# Patient Record
Sex: Female | Born: 1960 | Race: White | Hispanic: No | Marital: Married | Smoking: Never smoker
Health system: Southern US, Community
[De-identification: ages and names within clinical notes are randomized; demographics above are authoritative.]

## PROBLEM LIST (undated history)

## (undated) DIAGNOSIS — K219 Gastro-esophageal reflux disease without esophagitis: Secondary | ICD-10-CM

## (undated) DIAGNOSIS — I10 Essential (primary) hypertension: Secondary | ICD-10-CM

## (undated) DIAGNOSIS — Z9889 Other specified postprocedural states: Secondary | ICD-10-CM

## (undated) DIAGNOSIS — M1990 Unspecified osteoarthritis, unspecified site: Secondary | ICD-10-CM

## (undated) DIAGNOSIS — K631 Perforation of intestine (nontraumatic): Secondary | ICD-10-CM

## (undated) DIAGNOSIS — F419 Anxiety disorder, unspecified: Secondary | ICD-10-CM

## (undated) HISTORY — DX: Gastro-esophageal reflux disease without esophagitis: K21.9

## (undated) HISTORY — PX: FOOT SURGERY: SHX648

## (undated) HISTORY — PX: HERNIA REPAIR: SHX51

## (undated) HISTORY — PX: APPENDECTOMY: SHX54

## (undated) HISTORY — PX: HAND SURGERY: SHX662

## (undated) HISTORY — DX: Other specified postprocedural states: Z98.890

## (undated) HISTORY — PX: CHOLECYSTECTOMY: SHX55

---

## 2000-07-06 ENCOUNTER — Encounter: Admission: RE | Admit: 2000-07-06 | Discharge: 2000-07-06 | Payer: Self-pay | Admitting: Family Medicine

## 2000-08-30 ENCOUNTER — Encounter
Admission: RE | Admit: 2000-08-30 | Discharge: 2000-11-28 | Payer: Self-pay | Admitting: Physical Medicine and Rehabilitation

## 2000-10-01 ENCOUNTER — Encounter: Payer: Self-pay | Admitting: Physical Medicine and Rehabilitation

## 2000-10-01 ENCOUNTER — Encounter
Admission: RE | Admit: 2000-10-01 | Discharge: 2000-10-01 | Payer: Self-pay | Admitting: Physical Medicine and Rehabilitation

## 2001-08-01 ENCOUNTER — Ambulatory Visit (HOSPITAL_COMMUNITY): Admission: RE | Admit: 2001-08-01 | Discharge: 2001-08-01 | Payer: Self-pay | Admitting: Internal Medicine

## 2004-09-08 ENCOUNTER — Encounter: Admission: RE | Admit: 2004-09-08 | Discharge: 2004-09-22 | Payer: Self-pay | Admitting: Orthopedic Surgery

## 2006-06-10 ENCOUNTER — Ambulatory Visit: Payer: Self-pay | Admitting: Internal Medicine

## 2006-06-22 ENCOUNTER — Ambulatory Visit: Payer: Self-pay | Admitting: Internal Medicine

## 2006-06-22 ENCOUNTER — Ambulatory Visit (HOSPITAL_COMMUNITY): Admission: RE | Admit: 2006-06-22 | Discharge: 2006-06-22 | Payer: Self-pay | Admitting: Internal Medicine

## 2008-01-13 ENCOUNTER — Ambulatory Visit (HOSPITAL_COMMUNITY): Admission: RE | Admit: 2008-01-13 | Discharge: 2008-01-13 | Payer: Self-pay | Admitting: Family Medicine

## 2010-04-16 ENCOUNTER — Other Ambulatory Visit: Payer: Self-pay | Admitting: Neurology

## 2010-04-16 DIAGNOSIS — M542 Cervicalgia: Secondary | ICD-10-CM

## 2010-04-22 ENCOUNTER — Ambulatory Visit (HOSPITAL_COMMUNITY)
Admission: RE | Admit: 2010-04-22 | Discharge: 2010-04-22 | Disposition: A | Discharge status: Home / Self Care | Payer: BC Managed Care – PPO | Source: Ambulatory Visit | Attending: Neurology | Admitting: Neurology

## 2010-04-22 DIAGNOSIS — M542 Cervicalgia: Secondary | ICD-10-CM

## 2010-04-22 DIAGNOSIS — M5030 Other cervical disc degeneration, unspecified cervical region: Secondary | ICD-10-CM | POA: Insufficient documentation

## 2010-04-22 DIAGNOSIS — M79609 Pain in unspecified limb: Secondary | ICD-10-CM | POA: Insufficient documentation

## 2010-04-22 DIAGNOSIS — M47812 Spondylosis without myelopathy or radiculopathy, cervical region: Secondary | ICD-10-CM | POA: Insufficient documentation

## 2010-07-11 NOTE — Op Note (Signed)
[name], [name]               ACCOUNT NO.:  [id number]   MEDICAL RECORD NO.:  [id number]          PATIENT TYPE:  AMB   LOCATION:  DAY                           FACILITY:  [facility]   PHYSICIAN:  R. Roetta Sessions, M.D. DATE OF BIRTH:  10-12-1960   DATE OF PROCEDURE:  06/22/2006  DATE OF DISCHARGE:                               OPERATIVE REPORT   PROCEDURE:  Diagnostic esophagogastroduodenoscopy with [facility] dilation.   INDICATIONS FOR PROCEDURE:  A 50 year old lady with worsening reflux  symptoms in the setting of significant weight gain.  She also has  recurrent esophageal dysphagia.  Prior EGD demonstrated patulous EG  junction.  However, in 2003 when this was done, a 56-French Maloney  dilator was passed associated with resolution of her symptoms.  EGD is  now being done to further evaluate her symptoms.  It is notable that I  switched her out from Nexium to AcipHex 20 mg orally b.i.d. when I saw  her in the office on June 10, 2006.  This has been associated with  significant improvement in her reflux symptoms.  Please see  documentation in the medical record for more information.   PROCEDURE NOTE:  O2 saturation, blood pressure, pulse and respirations  were monitored throughout the entire procedure.  Conscious sedation:  Versed 5 mg IV, Demerol 125 mg IV in divided doses.  Cetacaine spray for  topical pharyngeal anesthesia.  Instrument:  Pentax video chip system.   FINDINGS:  Examination of the tubular esophagus revealed a very  noncritical Schatzki ring and a patulous EG junction.  The esophageal  mucosa otherwise appeared normal.  EG junction easily traversed.   Stomach:  Gastric cavity was empty and insufflated well with air.  A  thorough examination of the gastric mucosa including a retroflexed view  of the proximal stomach and esophagogastric junction demonstrated only a  small hiatal hernia.  Pylorus was patent and easily traversed.  Examination of the bulb and second  portion revealed no abnormalities.   therapeutic/diagnostic maneuvers performed:  The scope was withdrawn.  A  56-French Maloney dilator was passed to full insertion.  A look back  revealed no apparent complication related to passage of the dilator.  The patient tolerated the procedure well and was reacted in endoscopy.   IMPRESSION:  1. Noncritical Schatzki ring otherwise, normal esophagus.  2. Patulous esophagogastric junction, status post passage of 56-French      Maloney dilator.  3. Small hiatal hernia.  4. Otherwise normal stomach, D1, D2.   RECOMMENDATIONS:  1. Continue AcipHex 20 mg orally b.i.d. for the next month.  2. Antireflux/lifestyle/diet literature provided.  I have encouraged      continued weight loss and dietary modification.  We will see her      back in the office in 1 month.      [name], M.D.  Electronically Signed     RMR/MEDQ  D:  06/22/2006  T:  06/22/2006  Job:  [id number]   cc:   Marrian Salvage, M.D.  OB/GYN, [place], [place]

## 2010-07-11 NOTE — Op Note (Signed)
[facility]  Patient:    [name], STITZER Visit Number: [id number] MRN: [id number]          Service Type: END Location: DAY Attending Physician:  Jonathon Bellows Dictated by:   Roetta Sessions, M.D. Proc. Date: 08/01/01 Admit Date:  08/01/2001   CC:         Dr. Reola Calkins,  [facility]   Operative Report  PROCEDURE:  Esophagogastroduodenoscopy with [name] dilation followed by biopsy.  INDICATIONS FOR PROCEDURE:  The patient is a 50 year old lady with longstanding gastroesophageal reflux symptoms and breakthrough symptomatology on Protonix 40 mg orally daily. She has intermittent esophageal dysphagia. EGD is done to further evaluate her symptoms. This approach has been discussed with Ms. Colgan at length. The potential risks, benefits, and alternatives have been reviewed, questions answered. She is agreeable. Please see my consultation note from Jul 13, 2001 for more information.  MONITORING:  O2 saturations, blood pressure, pulse and respirations monitored throughout the entire procedure.  CONSCIOUS SEDATION:  Versed 3 mg IV, Demerol 75 mg IV in divided doses.  INSTRUMENT:  Olympus video GIF gastroscope.  FINDINGS:  Examination of the tubular esophagus revealed a patulous EG junction. There was no evidence of ring, stricture, Barretts esophagus or esophagitis. The EG junction was easily traversed.  STOMACH:  The gastric cavity was empty and insufflated well with air. A thorough examination of the gastric mucosa including a retroflexed view of the proximal stomach and esophagogastric junction demonstrated a small hiatal hernia and multiple antral erosions. Some of these erosions were as wide as 4 mm and as long as 2-3 cm. Please see photos. There did not appear to be an infiltrating process. There is no frank ulcer. The pylorus was patent and easily traversed.  DUODENUM:  The bulb and second portion appeared  normal.  THERAPEUTIC/DIAGNOSTIC MANEUVERS PERFORMED:  A 56 French Maloney dilator is passed to full insertion without resistance and without blood on the dilator. Look back revealed no apparent complications related to the passage of the dilator. Subsequently, biopsies for histology from the antrum were taken. The patient tolerated the procedure well and was reacted in endoscopy.  IMPRESSION:  1. Normal esophagus, patulous EG junction status post Maloney dilation.  2. Small hiatal hernia.  3. Multiple antral erosions, otherwise, gastric mucosa appeared normal.     Normal D1 and D2. Biopsies for histology of the antrum taken.  Through Dr. Lonell Grandchild office, CBC was normal, H. pylori serologies were negative, CHEM 20 was negative, amylase was normal.  RECOMMENDATIONS:  1. Increase Protonix to 40 mg orally b.i.d.  2. Antireflux measures emphasized.  3. Follow-up on path.  4. Office visit with [place] in six weeks. Dictated by:   Roetta Sessions, M.D. Attending Physician:  Jonathon Bellows DD:  08/01/01 TD:  08/02/01 Job: [id number]

## 2010-07-11 NOTE — H&P (Signed)
[name], [name]               ACCOUNT NO.:  [id number]   MEDICAL RECORD NO.:  [id number]          PATIENT TYPE:  AMB   LOCATION:                                FACILITY:  [facility]   PHYSICIAN:  R. Roetta Sessions, M.D. DATE OF BIRTH:  July 26, 1960   DATE OF ADMISSION:  DATE OF DISCHARGE:  LH                              HISTORY & PHYSICAL   CHIEF COMPLAINT:  Difficulty swallowing and worsening reflux symptoms.   HISTORY OF PRESENT ILLNESS:  Ms. [name] is [name] residing in [place], [place], who came to see me for further  evaluation of reflux symptoms and intermittent dysphagia to solid food.  I saw this nice lady for similar symptoms in June 2003.  She underwent  an EGD, which revealed a normal esophagus, patulous EG junction, a small  hiatal hernia and some antral erosions.  Biopsies revealed a non-H.  pylori-related gastritis.  A 56-French Maloney dilator was passed, which  was associated with resolution of her dysphagia.  She had been on  Protonix and subsequently switched to Nexium along the way and things  did well for awhile, until the past 6 months.  She has gained a good 30  pounds since she was last seen.  She has been Dr. Marrian Spencer, OB/GYN,  up in [place] and she is on a diet; she has lost a few pounds  recently.  She has not had any melena or rectal bleeding.  No true  odynophagia.  She also notes a lump along her upper breast bone.  She  rarely consumes alcohol.  There is no tobacco history.  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   PAST MEDICAL HISTORY:  1. Depression.  2. GERD.   PAST SURGERIES:  1. Cholecystectomy.  2. C-section.  3. Foot surgery.   CURRENT MEDICATIONS:  1. Adipex diet prescription.  2. Nexium 40 mg orally b.i.d.   ALLERGIES:  Possibly CODEINE.   FAMILY HISTORY:  Mother has fibromyalgia.  Father had esophageal and  throat cancer, otherwise negative for chronic GI or liver illness.   SOCIAL HISTORY:  The patient is married with 2 children, works for  [employer], no tobacco, occasional alcoholic beverage.   REVIEW OF SYSTEMS:  No chest pain on exertion.  Weight gain as outlined  above.  No fever or chills.   PHYSICAL EXAMINATION:  GENERAL:  A pleasant 45-year lady resting  comfortably.  VITAL SIGNS:  Weight 195, height 5 feet 1 inch.  Temperature 98, BP  110/82, pulse 84.  SKIN:  Warm and dry.  CHEST:  Lungs are clear to auscultation.  She perceives a knot over  her upper sternal body.  On examination of this area, I do not palpate  any abnormality or tenderness.  BREASTS:  Exam is deferred.  ABDOMEN:  Obese, positive bowel sounds, soft, nontender without  appreciable mass or organomegaly.  EXTREMITIES:  No  edema.   IMPRESSION:  Ms. [name] is a pleasant 50 year old lady with  worsening reflux symptoms and intermittent esophageal dysphagia in the  setting of significant weight gain.  She is significantly over her ideal  body weight.  She has become refractory to Nexium therapy.  She needs to  have another esophagogastroduodenoscopy with possible esophageal  dilation; this approach has been discussed with Ms. [name] and  potential risks, benefits and alternatives have been reviewed, questions  answered and she is agreeable.  I plan to perform  esophagogastroduodenoscopy with possible esophageal dilation in the near  future at [facility].  In the interim, we will go ahead and  just switch her out from Nexium to Aciphex 20 mg orally b.i.d.  We will  give her enough samples for 2 weeks to see how this works for this nice  lady and we will go from there.      Jonathon Bellows, M.D.  Electronically Signed     RMR/MEDQ  D:  06/10/2006  T:  06/11/2006  Job:  [id number]

## 2010-07-11 NOTE — Consult Note (Signed)
[facility]  Patient:    [name], [name] Visit Number: [id number] MRN: [id number]          Service Type: END Location: DAY Attending Physician:  Jonathon Bellows Dictated by:   Roetta Sessions, M.D. Proc. Date: 07/13/01 Admit Date:  08/01/2001 Discharge Date: 08/01/2001   CC:         Dr. Reola Calkins, [facility], [facility]   Consultation Report  DATE OF BIRTH:  Jul 20, 1960  REASON FOR CONSULTATION:  Longstanding GERD.  HISTORY OF PRESENT ILLNESS:  Ms. [name] is a pleasant 50 year old lady referred through the courtesy of Dr. Reola Calkins over in [place] to further evaluate a several-year history of typical reflux symptoms.  The patient has reflux for at least 10 to 20 years.  She has taken a variety of over-the-counter agents including H2 blockers and Rolaids.  She more recently has been on Prevacid, Nexium, and currently Protonix with varying degrees of effect.  She continues to have some breakthrough symptoms but has been helped considerably with PPI therapy.  She had never had an upper GI series or direct visualization of her upper GI tract.  She rarely has esophageal dysphagia, but this is unusual. She does not have any early satiety, no nausea or vomiting, no melena, no rectal bleeding.  She moves her bowels at least once daily.  She does not smoke.  She rarely consumes alcohol.  PAST MEDICAL HISTORY:  Significant for depression.  PAST SURGICAL HISTORY:  Cholecystectomy, C section x 1.  CURRENT MEDICATIONS: 1. Protonix 40 mg orally daily. 2. Prozac 20 mg daily. 3. Multivitamins daily.  ALLERGIES:  CODEINE causes hot flashes.  FAMILY HISTORY:  Father has history of cancer of the esophagus.  He had a resection.  He had longstanding reflux and never smoked or consumed alcohol. Otherwise negative for prior GI or liver disease.  SOCIAL HISTORY:  The patient has been married for 4 years, has two children, three step  children.  She is employed for [employer].  She does not use tobacco, rarely consumes alcohol.  REVIEW OF SYSTEMS:   No chest pain, dyspnea on exertion.  No fever, chills, no weight loss.  PHYSICAL EXAMINATION:  GENERAL:  Pleasant 50 year old resting comfortably.  VITAL SIGNS:  Weight 170.5.  Height 5 feet 11.  Temperature 98.  Blood pressure 102/62.  Pulse 58.  SKIN:  No jaundice.  HEENT:  No scleral icterus.  NECK:  JVD not prominent.  CHEST:  Lungs are clear to auscultation.  CARDIAC:  Regular rate and rhythm without murmur, gallop, or rub.  ABDOMEN:  Obese, nondistended.  Positive bowel sounds.  Some epigastric tenderness to palpation.  No appreciable mass or organomegaly.  EXTREMITIES:  No edema.  IMPRESSION:  Ms. [name] is a 50 year old lady with longstanding typical reflux symptoms.  Symptoms have been significantly blunted with proton pump inhibitor therapy.  She continues to have breakthrough of rather aggressive antireflux regimen.  She really needs to have her upper gastrointestinal tract evaluated to rule out complicating gastroesophageal reflux disease.  I have discussed this approach with [name].  Potential risks, benefits, and alternatives have been reviewed and questions answered.  She is agreeable. Will plan to perform EGD in the near future at [facility].  Further recommendations will follow.  I would like to thank Dr. Reola Calkins for allowing me to see this nice lady today. Dictated by:   Roetta Sessions, M.D. Attending Physician:  Jonathon Bellows DD:  07/13/01 TD:  07/13/01 Job: [id number]

## 2010-10-09 ENCOUNTER — Ambulatory Visit (INDEPENDENT_AMBULATORY_CARE_PROVIDER_SITE_OTHER): Payer: BC Managed Care – PPO | Admitting: Gastroenterology

## 2010-10-09 ENCOUNTER — Encounter: Payer: Self-pay | Admitting: Gastroenterology

## 2010-10-09 VITALS — BP 147/85 | HR 74 | Temp 97.5°F | Ht 61.0 in | Wt 193.8 lb

## 2010-10-09 DIAGNOSIS — K219 Gastro-esophageal reflux disease without esophagitis: Secondary | ICD-10-CM

## 2010-10-09 DIAGNOSIS — R1310 Dysphagia, unspecified: Secondary | ICD-10-CM

## 2010-10-09 DIAGNOSIS — R1013 Epigastric pain: Secondary | ICD-10-CM

## 2010-10-09 DIAGNOSIS — Z1211 Encounter for screening for malignant neoplasm of colon: Secondary | ICD-10-CM

## 2010-10-09 LAB — HEPATIC FUNCTION PANEL
AST: 24 U/L (ref 0–37)
Albumin: 4.2 g/dL (ref 3.5–5.2)
Bilirubin, Direct: 0.1 mg/dL (ref 0.0–0.3)
Total Bilirubin: 0.4 mg/dL (ref 0.3–1.2)

## 2010-10-09 LAB — LIPASE: Lipase: 13 U/L (ref 0–75)

## 2010-10-09 MED ORDER — ESOMEPRAZOLE MAGNESIUM 40 MG PO CPDR: 40.0000 mg | DELAYED_RELEASE_CAPSULE | Freq: Two times a day (BID) | ORAL | Status: DC

## 2010-10-09 NOTE — Assessment & Plan Note (Signed)
No prior colonoscopy, routine screening due. No lower GI symptoms currently. Proceed with colonoscopy at time of EGD.  Proceed with TCS with Dr. Jena Gauss in near future: the risks, benefits, and alternatives have been discussed with the patient in detail. The patient states understanding and desires to proceed.

## 2010-10-09 NOTE — Progress Notes (Signed)
Cc to PCP 

## 2010-10-09 NOTE — Patient Instructions (Addendum)
We have set you up for a look at your upper and lower GI tract.  Stop Prilosec. Start Nexium 40 mg before breakfast and before dinner.   Stop taking Aleve or anything of that nature (Ibuprofen, Advil, Naproxen, Naprosyn).  Try Tylenol Arthritis for knee pain.   Please have labs drawn, and we will call you with the results.

## 2010-10-09 NOTE — Assessment & Plan Note (Signed)
50 year old Caucasian female with 3-4 month hx of worsening reflux and dysphagia. Known Schatzki's ring in past, non-critical. Last dilatation in 2008, hx of antral erosions in 2003. Likely exacerbated reflux due to missing doses of PPI (ran out of Nexium, switched to Prilosec), dysphagia likely secondary to known Schatzki's ring or uncontrolled reflux. Complains of epigastric pain as well, 1-2 times per day, sometimes worsened with eating, in the setting of twice daily Aleve. Concern for NSAID-induced gastritis, PUD. On physical exam, TTP in RUQ as well. Doubt dealing with other etiology, but we will add lipase and HFP for completeness.   Switch from Prilosec to Nexium, increased to BID (pt was on BID in the past, Nexium worked best) STOP Aleve and anything of that nature. Only tylenol products HFP, Lipase Proceed with upper endoscopy/possible dilation in the near future with Dr. Jena Gauss. The risks, benefits, and alternatives have been discussed in detail with patient. They have stated understanding and desire to proceed.

## 2010-10-09 NOTE — Progress Notes (Signed)
Referring Provider: No ref. provider found Primary Care Physician:  Kirk Ruths, MD Primary Gastroenterologist:  Dr. Jena Gauss   Chief Complaint  Patient presents with  . Gastrophageal Reflux    HPI:   [name] is a pleasant 50 year old Caucasian female who presents today at the request of Dr. Josephina Shih secondary to dysphagia and reflux. She has a hx of non-critical Schatzki's ring, s/p dilation in 2008. In 2003, EGD showed chronic gastritis, no H.pylori. Dr. Andrey Campanile actually performed a fiberoptic laryngoscopy in early August. This showed only abnormal thickened mucosa in arytenoid region.  Reports a 3-4 months worsening dysphagia, severe reflux. Difficulty with cold and tough textures. Describes reflux as constant. She has been on Aciphex and Nexium in the past; Nexium ran out a few months ago. She then switched to Prilosec, with little relief. She feels Nexium was the best agent for her in the past.   She reports epigastric pain as well, intermittent, 1-2 times per day. Occasionally exacerbated by eating. Described as "stabbing", radiating up towards retrosternal area, lasting a minute or two. Holds breath to find relief. Takes Aleve twice per day. No aspirin powders. Denies melena or brbpr. No N/V. She did not know that Aleve was an NSAID.  She has had no prior colonoscopy.   Past Medical History  Diagnosis Date  . S/P endoscopy     2008: noncritical Schatzki ring s/p dilation, normal esophagus and stomach, 2003: normal esophagus, s/p Maloney dilation, multiple antral erosions consistent with chronic gastritis, no H.pylori,   . GERD (gastroesophageal reflux disease)     Past Surgical History  Procedure Date  . Cholecystectomy   . Hand surgery     left  . Foot surgery     right  . Cesarean section     Current Outpatient Prescriptions  Medication Sig Dispense Refill  . omeprazole (PRILOSEC OTC) 20 MG tablet Take 20 mg by mouth daily.        . Pyridoxine HCl (VITAMIN  B-6) 500 MG tablet Take 500 mg by mouth 2 (two) times daily.        . vitamin B-12 (CYANOCOBALAMIN) 1000 MCG tablet Take 1,000 mcg by mouth daily.        . vitamin E 1000 UNIT capsule Take 1,000 Units by mouth daily.        Marland Kitchen esomeprazole (NEXIUM) 40 MG capsule Take 1 capsule (40 mg total) by mouth 2 (two) times daily. Take 30 minutes before breakfast and 30 minutes before dinner.  62 capsule  1    Allergies as of 10/09/2010  . (No Known Allergies)    Family History  Problem Relation Age of Onset  . Cancer Father     esophagus ca, living  . Colon cancer Paternal Uncle     in 69s, early 44s    History   Social History  . Marital Status: Married    Spouse Name: N/A    Number of Children: N/A  . Years of Education: N/A   Occupational History  . [employer] stage manager    Social History Main Topics  . Smoking status: Never Smoker   . Smokeless tobacco: Not on file  . Alcohol Use: Yes     one night a week  . Drug Use: No  . Sexually Active: Not on file   Other Topics Concern  . Not on file   Social History Narrative  . No narrative on file    Review of Systems: Gen: Denies any fever, chills, loss  of appetite, fatigue, weight loss. CV: Denies chest pain, heart palpitations, syncope, peripheral edema. Resp: Denies shortness of breath with rest, cough, wheezing GI: +dysphagia. Denies hematemesis, fecal incontinence, or jaundice.  GU : Denies urinary burning, urinary frequency, urinary incontinence.  MS: Denies joint pain, muscle weakness, cramps, limited movement Derm: Denies rash, itching, dry skin Psych: Denies depression, anxiety, confusion or memory loss  Heme: Denies bruising, bleeding, and enlarged lymph nodes.  Physical Exam: BP 147/85  Pulse 74  Temp(Src) 97.5 F (36.4 C) (Temporal)  Ht 5\' 1"  (1.549 m)  Wt 193 lb 12.8 oz (87.907 kg)  BMI 36.62 kg/m2 General:   Alert and oriented. Well-developed, well-nourished, pleasant and cooperative. Head:  Normocephalic  and atraumatic. Eyes:  Conjunctiva pink, sclera clear, no icterus.   Conjunctiva pink. Ears:  Normal auditory acuity. Nose:  No deformity, discharge,  or lesions. Mouth:  No deformity or lesions, mucosa pink and moist.  Neck:  Supple, without mass or thyromegaly. Lungs:  Clear to auscultation bilaterally, without wheezing, rales, or rhonchi.  Heart:  S1, S2 present without murmurs noted.  Abdomen:  +BS, soft, TTP epigastric and RUQ. NO masses noted.  non-distended. Without  HSM. No rebound or guarding. No hernias noted. Rectal:  Deferred  Msk:  Symmetrical without gross deformities. Normal posture. Extremities:  Without clubbing or edema. Neurologic:  Alert and  oriented x4;  grossly normal neurologically. Skin:  Intact, warm and dry without significant lesions or rashes Cervical Nodes:  No significant cervical adenopathy. Psych:  Alert and cooperative. Normal mood and affect.

## 2010-10-16 MED ORDER — SODIUM CHLORIDE 0.45 % IV SOLN
Freq: Once | INTRAVENOUS | Status: AC
  Administered 2010-10-17: 1000 mL via INTRAVENOUS

## 2010-10-17 ENCOUNTER — Encounter (HOSPITAL_COMMUNITY): Payer: Self-pay | Admitting: *Deleted

## 2010-10-17 ENCOUNTER — Encounter (HOSPITAL_COMMUNITY)
Admission: RE | Disposition: A | Discharge status: Home / Self Care | Payer: Self-pay | Source: Ambulatory Visit | Attending: Internal Medicine

## 2010-10-17 ENCOUNTER — Ambulatory Visit (HOSPITAL_COMMUNITY)
Admission: RE | Admit: 2010-10-17 | Discharge: 2010-10-17 | Disposition: A | Discharge status: Home / Self Care | Payer: BC Managed Care – PPO | Source: Ambulatory Visit | Attending: Internal Medicine | Admitting: Internal Medicine

## 2010-10-17 DIAGNOSIS — Z1211 Encounter for screening for malignant neoplasm of colon: Secondary | ICD-10-CM | POA: Insufficient documentation

## 2010-10-17 DIAGNOSIS — K222 Esophageal obstruction: Secondary | ICD-10-CM | POA: Insufficient documentation

## 2010-10-17 DIAGNOSIS — R635 Abnormal weight gain: Secondary | ICD-10-CM | POA: Insufficient documentation

## 2010-10-17 DIAGNOSIS — K219 Gastro-esophageal reflux disease without esophagitis: Secondary | ICD-10-CM

## 2010-10-17 DIAGNOSIS — R1310 Dysphagia, unspecified: Secondary | ICD-10-CM | POA: Insufficient documentation

## 2010-10-17 DIAGNOSIS — R109 Unspecified abdominal pain: Secondary | ICD-10-CM | POA: Insufficient documentation

## 2010-10-17 HISTORY — PX: MALONEY DILATION: SHX5535

## 2010-10-17 HISTORY — PX: ESOPHAGOGASTRODUODENOSCOPY: SHX5428

## 2010-10-17 HISTORY — PX: COLONOSCOPY: SHX5424

## 2010-10-17 SURGERY — COLONOSCOPY
Anesthesia: Moderate Sedation

## 2010-10-17 MED ORDER — BUTAMBEN-TETRACAINE-BENZOCAINE 2-2-14 % EX AERO
INHALATION_SPRAY | CUTANEOUS | Status: DC | PRN
  Administered 2010-10-17: 2 via TOPICAL

## 2010-10-17 MED ORDER — MIDAZOLAM HCL 5 MG/5ML IJ SOLN
INTRAMUSCULAR | Status: DC | PRN
  Administered 2010-10-17 (×2): 2 mg via INTRAVENOUS
  Administered 2010-10-17: 1 mg via INTRAVENOUS

## 2010-10-17 MED ORDER — MIDAZOLAM HCL 5 MG/5ML IJ SOLN
INTRAMUSCULAR | Status: AC
  Filled 2010-10-17: qty 10

## 2010-10-17 MED ORDER — MEPERIDINE HCL 100 MG/ML IJ SOLN
INTRAMUSCULAR | Status: DC | PRN
  Administered 2010-10-17 (×2): 50 mg via INTRAVENOUS

## 2010-10-17 MED ORDER — MEPERIDINE HCL 100 MG/ML IJ SOLN
INTRAMUSCULAR | Status: AC
  Filled 2010-10-17: qty 2

## 2010-10-17 NOTE — H&P (Signed)
GERD (gastroesophageal reflux disease)   - Primary    530.81    Epigastric pain     789.06    Dysphagia     787.20    Encounter for screening colonoscopy     V76.51      Reason for Visit     Gastrophageal Reflux        Current Vitals       Recorded User        10/09/2010  9:29 AM  Ginger L Walker, NT           BP Pulse Temp (Src) Resp Ht Wt    147/85  74  97.5 F (36.4 C) (Temporal)  N/A  5\' 1"  (1.549 m)  193 lb 12.8 oz (87.907 kg)       BMI SpO2 PF LMP    36.62 kg/m2  N/A  N/A  N/A          Progress Notes     Gerrit Halls, NP  10/09/2010 11:24 AM  Signed   Referring Provider: No ref. provider found Primary Care Physician:  Kirk Ruths, MD Primary Gastroenterologist:  Dr. Jena Gauss      Chief Complaint   Patient presents with   .  Gastrophageal Reflux      HPI:    Ms. Gravelle is a pleasant 50 year old Caucasian female who presents today at the request of Dr. Josephina Shih secondary to dysphagia and reflux. She has a hx of non-critical Schatzki's ring, s/p dilation in 2008. In 2003, EGD showed chronic gastritis, no H.pylori. Dr. Andrey Campanile actually performed a fiberoptic laryngoscopy in early August. This showed only abnormal thickened mucosa in arytenoid region.   Reports a 3-4 months worsening dysphagia, severe reflux. Difficulty with cold and tough textures. Describes reflux as constant. She has been on Aciphex and Nexium in the past; Nexium ran out a few months ago. She then switched to Prilosec, with little relief. She feels Nexium was the best agent for her in the past.    She reports epigastric pain as well, intermittent, 1-2 times per day. Occasionally exacerbated by eating. Described as "stabbing", radiating up towards retrosternal area, lasting a minute or two. Holds breath to find relief. Takes Aleve twice per day. No aspirin powders. Denies melena or brbpr. No N/V. She did not know that Aleve was an NSAID.   She has had no prior colonoscopy.     Past  Medical History   Diagnosis  Date   .  S/P endoscopy         2008: noncritical Schatzki ring s/p dilation, normal esophagus and stomach, 2003: normal esophagus, s/p Maloney dilation, multiple antral erosions consistent with chronic gastritis, no H.pylori,    .  GERD (gastroesophageal reflux disease)         Past Surgical History   Procedure  Date   .  Cholecystectomy     .  Hand surgery         left   .  Foot surgery         right   .  Cesarean section         Current Outpatient Prescriptions   Medication  Sig  Dispense  Refill   .  omeprazole (PRILOSEC OTC) 20 MG tablet  Take 20 mg by mouth daily.           .  Pyridoxine HCl (VITAMIN B-6) 500 MG tablet  Take 500 mg by mouth 2 (two) times daily.           Marland Kitchen  vitamin B-12 (CYANOCOBALAMIN) 1000 MCG tablet  Take 1,000 mcg by mouth daily.           .  vitamin E 1000 UNIT capsule  Take 1,000 Units by mouth daily.           Marland Kitchen  esomeprazole (NEXIUM) 40 MG capsule  Take 1 capsule (40 mg total) by mouth 2 (two) times daily. Take 30 minutes before breakfast and 30 minutes before dinner.   62 capsule   1       Allergies as of 10/09/2010   .  (No Known Allergies)       Family History   Problem  Relation  Age of Onset   .  Cancer  Father         esophagus ca, living   .  Colon cancer  Paternal Uncle         in 62s, early 18s       History       Social History   .  Marital Status:  Married       Spouse Name:  N/A       Number of Children:  N/A   .  Years of Education:  N/A       Occupational History   .  [employer] stage manager         Social History Main Topics   .  Smoking status:  Never Smoker    .  Smokeless tobacco:  Not on file   .  Alcohol Use:  Yes         one night a week   .  Drug Use:  No   .  Sexually Active:  Not on file       Other Topics  Concern   .  Not on file       Social History Narrative   .  No narrative on file      Review of Systems: Gen: Denies any fever, chills, loss of appetite, fatigue,  weight loss. CV: Denies chest pain, heart palpitations, syncope, peripheral edema. Resp: Denies shortness of breath with rest, cough, wheezing GI: +dysphagia. Denies hematemesis, fecal incontinence, or jaundice.   GU : Denies urinary burning, urinary frequency, urinary incontinence.   MS: Denies joint pain, muscle weakness, cramps, limited movement Derm: Denies rash, itching, dry skin Psych: Denies depression, anxiety, confusion or memory loss   Heme: Denies bruising, bleeding, and enlarged lymph nodes.   Physical Exam: BP 147/85  Pulse 74  Temp(Src) 97.5 F (36.4 C) (Temporal)  Ht 5\' 1"  (1.549 m)  Wt 193 lb 12.8 oz (87.907 kg)  BMI 36.62 kg/m2 General:   Alert and oriented. Well-developed, well-nourished, pleasant and cooperative. Head:  Normocephalic and atraumatic. Eyes:  Conjunctiva pink, sclera clear, no icterus.   Conjunctiva pink. Ears:  Normal auditory acuity. Nose:  No deformity, discharge,  or lesions. Mouth:  No deformity or lesions, mucosa pink and moist.   Neck:  Supple, without mass or thyromegaly. Lungs:  Clear to auscultation bilaterally, without wheezing, rales, or rhonchi.   Heart:  S1, S2 present without murmurs noted.   Abdomen:  +BS, soft, TTP epigastric and RUQ. NO masses noted.  non-distended. Without  HSM. No rebound or guarding. No hernias noted. Rectal:  Deferred   Msk:  Symmetrical without gross deformities. Normal posture. Extremities:  Without clubbing or edema. Neurologic:  Alert and  oriented x4;  grossly normal neurologically. Skin:  Intact, warm and dry without significant lesions or  rashes Cervical Nodes:  No significant cervical adenopathy. Psych:  Alert and cooperative. Normal mood and affect.       Glendora Score  10/09/2010 11:33 AM  Signed Cc to PCP        GERD (gastroesophageal reflux disease) - Gerrit Halls, NP  10/09/2010 11:23 AM  Signed 50 year old Caucasian female with 3-4 month hx of worsening reflux and dysphagia. Known Schatzki's  ring in past, non-critical. Last dilatation in 2008, hx of antral erosions in 2003. Likely exacerbated reflux due to missing doses of PPI (ran out of Nexium, switched to Prilosec), dysphagia likely secondary to known Schatzki's ring or uncontrolled reflux. Complains of epigastric pain as well, 1-2 times per day, sometimes worsened with eating, in the setting of twice daily Aleve. Concern for NSAID-induced gastritis, PUD. On physical exam, TTP in RUQ as well. Doubt dealing with other etiology, but we will add lipase and HFP for completeness.    Switch from Prilosec to Nexium, increased to BID (pt was on BID in the past, Nexium worked best) STOP Aleve and anything of that nature. Only tylenol products HFP, Lipase Proceed with upper endoscopy/possible dilation in the near future with Dr. Jena Gauss. The risks, benefits, and alternatives have been discussed in detail with patient. They have stated understanding and desire to proceed.      Encounter for screening colonoscopy - Gerrit Halls, NP  10/09/2010 11:24 AM  Signed No prior colonoscopy, routine screening due. No lower GI symptoms currently. Proceed with colonoscopy at time of EGD.   Proceed with TCS with Dr. Jena Gauss in near future: the risks, benefits, and alternatives have been discussed with the patient in detail. The patient states understanding and desires to proceed.       I have seen the patient prior to the procedure(s) today and reviewed the history and physical / consultation from 10/09/10.  Worsening reflux symptoms since coming off of Nexium/AcipHex. Symptoms also worsening temporally related to a 20 pound weight gain over the past year or so.  Otherwise, there have been no changes. After consideration of the risks, benefits, alternatives and imponderables, the patient has consented to the procedure(s).

## 2010-10-23 ENCOUNTER — Encounter (HOSPITAL_COMMUNITY): Payer: Self-pay | Admitting: Internal Medicine

## 2010-12-22 ENCOUNTER — Other Ambulatory Visit: Payer: Self-pay | Admitting: Internal Medicine

## 2010-12-22 MED ORDER — DEXLANSOPRAZOLE 60 MG PO CPDR: 60.0000 mg | DELAYED_RELEASE_CAPSULE | Freq: Every day | ORAL | Status: DC

## 2010-12-22 NOTE — Telephone Encounter (Signed)
Done

## 2010-12-22 NOTE — Telephone Encounter (Signed)
Pt tried dexilant samples and would like prescription called into [facility] in [place].

## 2011-05-12 ENCOUNTER — Other Ambulatory Visit (HOSPITAL_COMMUNITY): Payer: Self-pay | Admitting: Orthopaedic Surgery

## 2011-05-12 DIAGNOSIS — R52 Pain, unspecified: Secondary | ICD-10-CM

## 2011-05-25 ENCOUNTER — Ambulatory Visit (HOSPITAL_COMMUNITY)
Admission: RE | Admit: 2011-05-25 | Discharge: 2011-05-25 | Disposition: A | Discharge status: Home / Self Care | Payer: BC Managed Care – PPO | Source: Ambulatory Visit | Attending: Orthopaedic Surgery | Admitting: Orthopaedic Surgery

## 2011-05-25 DIAGNOSIS — M25469 Effusion, unspecified knee: Secondary | ICD-10-CM | POA: Insufficient documentation

## 2011-05-25 DIAGNOSIS — X58XXXA Exposure to other specified factors, initial encounter: Secondary | ICD-10-CM | POA: Insufficient documentation

## 2011-05-25 DIAGNOSIS — R52 Pain, unspecified: Secondary | ICD-10-CM

## 2011-05-25 DIAGNOSIS — M1710 Unilateral primary osteoarthritis, unspecified knee: Secondary | ICD-10-CM | POA: Insufficient documentation

## 2011-05-25 DIAGNOSIS — M6750 Plica syndrome, unspecified knee: Secondary | ICD-10-CM | POA: Insufficient documentation

## 2011-05-25 DIAGNOSIS — IMO0002 Reserved for concepts with insufficient information to code with codable children: Secondary | ICD-10-CM | POA: Insufficient documentation

## 2011-05-25 DIAGNOSIS — M25569 Pain in unspecified knee: Secondary | ICD-10-CM | POA: Insufficient documentation

## 2011-06-22 ENCOUNTER — Other Ambulatory Visit: Payer: Self-pay | Admitting: Gastroenterology

## 2011-12-31 ENCOUNTER — Other Ambulatory Visit (HOSPITAL_COMMUNITY): Payer: Self-pay | Admitting: Orthopaedic Surgery

## 2011-12-31 NOTE — Patient Instructions (Addendum)
20 [name]  12/31/2011   Your procedure is scheduled on:  01/07/2012  Report to [facility] at  730  AM.  Call this number if you have problems the morning of surgery: [phone]   Remember:   Do not eat food:After Midnight.  May have clear liquids:until Midnight .   Take these medicines the morning of surgery with A SIP OF WATER:  dexilant   Do not wear jewelry, make-up or nail polish.  Do not wear lotions, powders, or perfumes.   Do not shave 48 hours prior to surgery. Men may shave face and neck.  Do not bring valuables to the hospital.  Contacts, dentures or bridgework may not be worn into surgery.  Leave suitcase in the car. After surgery it may be brought to your room.  For patients admitted to the hospital, checkout time is 11:00 AM the day of discharge.   Patients discharged the day of surgery will not be allowed to drive home.  Name and phone number of your driver: family  Special Instructions: Shower using CHG 2 nights before surgery and the night before surgery.  If you shower the day of surgery use CHG.  Use special wash - you have one bottle of CHG for all showers.  You should use approximately 1/3 of the bottle for each shower.   Please read over the following fact sheets that you were given: Pain Booklet, Coughing and Deep Breathing, MRSA Information, Surgical Site Infection Prevention, Anesthesia Post-op Instructions and Care and Recovery After Surgery Arthroscopic Procedure, Knee An arthroscopic procedure can find what is wrong with your knee. PROCEDURE Arthroscopy is a surgical technique that allows your orthopedic surgeon to diagnose and treat your knee injury with accuracy. They will look into your knee through a small instrument. This is almost like a small (pencil sized) telescope. Because arthroscopy affects your knee less than open knee surgery, you can anticipate a more rapid recovery. Taking an active role by following your caregiver's instructions  will help with rapid and complete recovery. Use crutches, rest, elevation, ice, and knee exercises as instructed. The length of recovery depends on various factors including type of injury, age, physical condition, medical conditions, and your rehabilitation. Your knee is the joint between the large bones (femur and tibia) in your leg. Cartilage covers these bone ends which are smooth and slippery and allow your knee to bend and move smoothly. Two menisci, thick, semi-lunar shaped pads of cartilage which form a rim inside the joint, help absorb shock and stabilize your knee. Ligaments bind the bones together and support your knee joint. Muscles move the joint, help support your knee, and take stress off the joint itself. Because of this all programs and physical therapy to rehabilitate an injured or repaired knee require rebuilding and strengthening your muscles. AFTER THE PROCEDURE  After the procedure, you will be moved to a recovery area until most of the effects of the medication have worn off. Your caregiver will discuss the test results with you.  Only take over-the-counter or prescription medicines for pain, discomfort, or fever as directed by your caregiver. SEEK MEDICAL CARE IF:   You have increased bleeding from your wounds.  You see redness, swelling, or have increasing pain in your wounds.  You have pus coming from your wound.  You have an oral temperature above 102 F (38.9 C).  You notice a bad smell coming from the wound or dressing.  You have severe pain with  any motion of your knee. SEEK IMMEDIATE MEDICAL CARE IF:   You develop a rash.  You have difficulty breathing.  You have any allergic problems. Document Released: 02/07/2000 Document Revised: 05/04/2011 Document Reviewed: 08/31/2007 [facility] Patient Information 2013 [place], [place]. PATIENT INSTRUCTIONS POST-ANESTHESIA  IMMEDIATELY FOLLOWING SURGERY:  Do not drive or operate machinery for the first twenty four  hours after surgery.  Do not make any important decisions for twenty four hours after surgery or while taking narcotic pain medications or sedatives.  If you develop intractable nausea and vomiting or a severe headache please notify your doctor immediately.  FOLLOW-UP:  Please make an appointment with your surgeon as instructed. You do not need to follow up with anesthesia unless specifically instructed to do so.  WOUND CARE INSTRUCTIONS (if applicable):  Keep a dry clean dressing on the anesthesia/puncture wound site if there is drainage.  Once the wound has quit draining you may leave it open to air.  Generally you should leave the bandage intact for twenty four hours unless there is drainage.  If the epidural site drains for more than 36-48 hours please call the anesthesia department.  QUESTIONS?:  Please feel free to call your physician or the hospital operator if you have any questions, and they will be happy to assist you.

## 2012-01-01 ENCOUNTER — Encounter (HOSPITAL_COMMUNITY)
Admission: RE | Admit: 2012-01-01 | Discharge: 2012-01-01 | Payer: BC Managed Care – PPO | Source: Ambulatory Visit | Attending: Orthopaedic Surgery | Admitting: Orthopaedic Surgery

## 2012-01-04 ENCOUNTER — Encounter (HOSPITAL_COMMUNITY): Payer: Self-pay | Admitting: Pharmacy Technician

## 2012-01-04 ENCOUNTER — Other Ambulatory Visit (HOSPITAL_COMMUNITY): Payer: Self-pay | Admitting: Orthopaedic Surgery

## 2012-01-04 NOTE — Patient Instructions (Addendum)
Your procedure is scheduled on:01/07/2012  Report to [name] at  9:10   AM.  Call this number if you have problems the morning of surgery: [phone]   Remember:   Do not drink or eat food:After Midnight.  :  Take these medicines the morning of surgery with A SIP OF WATER: Omeprazole   Do not wear jewelry, make-up or nail polish.  Do not wear lotions, powders, or perfumes. You may wear deodorant.  Do not shave 48 hours prior to surgery. Men may shave face and neck.  Do not bring valuables to the hospital.  Contacts, dentures or bridgework may not be worn into surgery.  Leave suitcase in the car. After surgery it may be brought to your room.  For patients admitted to the hospital, checkout time is 11:00 AM the day of discharge.   Patients discharged the day of surgery will not be allowed to drive home.    Special Instructions: Shower using CHG 2 nights before surgery and the night before surgery.  If you shower the day of surgery use CHG.  Use special wash - you have one bottle of CHG for all showers.  You should use approximately 1/3 of the bottle for each shower.   Please read over the following fact sheets that you were given: Pain Booklet, MRSA Information, Surgical Site Infection Prevention and Care and Recovery After Surgery  Arthroscopic Procedure, Knee Care After Refer to this sheet in the next few weeks. These discharge instructions provide you with general information on caring for yourself after you leave the hospital. Your caregiver may also give you specific instructions. Your treatment has been planned according to the most current medical practices available, but unavoidable complications sometimes occur. If you have any problems or questions after discharge, please call your caregiver. HOME CARE INSTRUCTIONS  It will be normal to be sore for a couple days after surgery. See your caregiver if this seems to be getting worse rather than better. Only take over-the-counter  or prescription medicines for pain, discomfort, or fever as directed by your caregiver.  Take showers rather than baths, or as directed by your caregiver.  Change bandages (dressings) if necessary or as directed.  You may resume normal diet and activities as directed or allowed.  Avoid lifting or driving until you are directed otherwise.  Make an appointment to see your caregiver for stitches (suture) or staple removal as directed.  You may put ice on the area.  Put ice in a plastic bag.  Place a towel between your skin and the bag.  Leave the ice on for 15 to 20 minutes, 3 to 4 times per day for the first 2 days. SEEK MEDICAL CARE IF:   You have increased bleeding from your wounds.  You see redness, swelling, or have increasing pain in your wounds.  You have pus coming from your wound.  You have an oral temperature above 102 F (38.9 C).  You notice a bad smell coming from the wound or dressing.  You have severe pain with any motion of your knee. SEEK IMMEDIATE MEDICAL CARE IF:   You develop a rash.  You have difficulty breathing  You develop any reaction or side effects to medicines taken. MAKE SURE YOU:   Understand these instructions.  Will watch your condition.  Will get help right away if you are not doing well or get worse. Document Released: 08/29/2004 Document Revised: 05/04/2011 Document Reviewed: 05/05/2007 [facility] Patient Information 2013 [place], [place].  PATIENT INSTRUCTIONS POST-ANESTHESIA  IMMEDIATELY FOLLOWING SURGERY:  Do not drive or operate machinery for the first twenty four hours after surgery.  Do not make any important decisions for twenty four hours after surgery or while taking narcotic pain medications or sedatives.  If you develop intractable nausea and vomiting or a severe headache please notify your doctor immediately.  FOLLOW-UP:  Please make an appointment with your surgeon as instructed. You do not need to follow up with  anesthesia unless specifically instructed to do so.  WOUND CARE INSTRUCTIONS (if applicable):  Keep a dry clean dressing on the anesthesia/puncture wound site if there is drainage.  Once the wound has quit draining you may leave it open to air.  Generally you should leave the bandage intact for twenty four hours unless there is drainage.  If the epidural site drains for more than 36-48 hours please call the anesthesia department.  QUESTIONS?:  Please feel free to call your physician or the hospital operator if you have any questions, and they will be happy to assist you.

## 2012-01-05 ENCOUNTER — Encounter (HOSPITAL_COMMUNITY): Payer: Self-pay

## 2012-01-05 ENCOUNTER — Encounter (HOSPITAL_COMMUNITY)
Admission: RE | Admit: 2012-01-05 | Discharge: 2012-01-05 | Disposition: A | Discharge status: Home / Self Care | Payer: BC Managed Care – PPO | Source: Ambulatory Visit | Attending: Orthopaedic Surgery | Admitting: Orthopaedic Surgery

## 2012-01-05 ENCOUNTER — Other Ambulatory Visit: Payer: Self-pay

## 2012-01-05 HISTORY — DX: Essential (primary) hypertension: I10

## 2012-01-05 HISTORY — DX: Anxiety disorder, unspecified: F41.9

## 2012-01-05 HISTORY — DX: Unspecified osteoarthritis, unspecified site: M19.90

## 2012-01-05 LAB — URINALYSIS, ROUTINE W REFLEX MICROSCOPIC
Bilirubin Urine: NEGATIVE
Glucose, UA: NEGATIVE mg/dL
Hgb urine dipstick: NEGATIVE
Specific Gravity, Urine: 1.025 (ref 1.005–1.030)
Urobilinogen, UA: 0.2 mg/dL (ref 0.0–1.0)
pH: 6 (ref 5.0–8.0)

## 2012-01-05 LAB — CBC WITH DIFFERENTIAL/PLATELET
Basophils Relative: 1 % (ref 0–1)
HCT: 39.8 % (ref 36.0–46.0)
Hemoglobin: 13.9 g/dL (ref 12.0–15.0)
Lymphocytes Relative: 34 % (ref 12–46)
Lymphs Abs: 2 10*3/uL (ref 0.7–4.0)
MCHC: 34.9 g/dL (ref 30.0–36.0)
Monocytes Absolute: 0.4 10*3/uL (ref 0.1–1.0)
Monocytes Relative: 6 % (ref 3–12)
Neutro Abs: 3.4 10*3/uL (ref 1.7–7.7)
Neutrophils Relative %: 56 % (ref 43–77)
RBC: 4.54 MIL/uL (ref 3.87–5.11)
WBC: 6 10*3/uL (ref 4.0–10.5)

## 2012-01-05 LAB — HCG, SERUM, QUALITATIVE: Preg, Serum: NEGATIVE

## 2012-01-05 LAB — COMPREHENSIVE METABOLIC PANEL
ALT: 21 U/L (ref 0–35)
CO2: 27 mEq/L (ref 19–32)
Calcium: 9.8 mg/dL (ref 8.4–10.5)
Creatinine, Ser: 0.52 mg/dL (ref 0.50–1.10)
GFR calc Af Amer: >90 mL/min (ref >90)
GFR calc non Af Amer: >90 mL/min (ref >90)
Glucose, Bld: 106 mg/dL — ABNORMAL HIGH (ref 70–99)
Sodium: 137 mEq/L (ref 135–145)
Total Protein: 7.3 g/dL (ref 6.0–8.3)

## 2012-01-05 LAB — SURGICAL PCR SCREEN: MRSA, PCR: NEGATIVE

## 2012-01-05 MED ORDER — CHLORHEXIDINE GLUCONATE 4 % EX LIQD: 60.0000 mL | Freq: Once | CUTANEOUS | Status: DC

## 2012-01-06 NOTE — H&P (Signed)
[name] is an 51 y.o. female.   Chief Complaint: right knee pain HPI: She has a nine month or more history of right knee pain getting gradually worse.  She had a MRI in April showing a tear of the medial meniscus of the right knee as well as tricompartmental degenerative joint disease.  I told her about arthroscopy of the knee at that time.  I went over risks and imponderables for the elective outpatient procedure. She has been doing exercises and taking diclofenac with only slight help. She wanted to wait a while.  Her knee continues to bother her more and more.  She would like to have the surgery now.  She will have physical therapy post operative.  Past Medical History  Diagnosis Date  . S/P endoscopy     2008: noncritical Schatzki ring s/p dilation, normal esophagus and stomach, 2003: normal esophagus, s/p Maloney dilation, multiple antral erosions consistent with chronic gastritis, no H.pylori,   . GERD (gastroesophageal reflux disease)   . Hypertension   . Anxiety   . Arthritis     Past Surgical History  Procedure Date  . Cholecystectomy   . Hand surgery     left  . Foot surgery     right  . Cesarean section   . Colonoscopy 10/17/2010    Procedure: COLONOSCOPY;  Surgeon: Corbin Ade, MD;  Location: [facility];  Service: Endoscopy;  Laterality: N/A;  9:30AM  . Esophagogastroduodenoscopy 10/17/2010    Procedure: ESOPHAGOGASTRODUODENOSCOPY (EGD);  Surgeon: Corbin Ade, MD;  Location: [facility];  Service: Endoscopy;  Laterality: N/A;  [name] dilation 10/17/2010    Procedure: [name] DILATION;  Surgeon: Corbin Ade, MD;  Location: [facility];  Service: Endoscopy;  Laterality: N/A;    Family History  Problem Relation Age of Onset  . Cancer Father     esophagus ca, living  . Colon cancer Paternal Uncle     in 21s, early 27s   Social History:  reports that she has never smoked. She does not have any smokeless tobacco history on file. She reports that she  drinks alcohol. She reports that she does not use illicit drugs.  Allergies: No Known Allergies  No prescriptions prior to admission    Results for orders placed during the hospital encounter of 01/05/12 (from the past 48 hour(s))  SURGICAL PCR SCREEN     Status: Normal   Collection Time   01/05/12  8:37 AM      Component Value Range Comment   MRSA, PCR NEGATIVE  NEGATIVE    Staphylococcus aureus NEGATIVE  NEGATIVE   CBC WITH DIFFERENTIAL     Status: Normal   Collection Time   01/05/12  8:40 AM      Component Value Range Comment   WBC 6.0  4.0 - 10.5 K/uL    RBC 4.54  3.87 - 5.11 MIL/uL    Hemoglobin 13.9  12.0 - 15.0 g/dL    HCT 04.5  40.9 - 81.1 %    MCV 87.7  78.0 - 100.0 fL    MCH 30.6  26.0 - 34.0 pg    MCHC 34.9  30.0 - 36.0 g/dL    RDW 91.4  78.2 - 95.6 %    Platelets 267  150 - 400 K/uL    Neutrophils Relative 56  43 - 77 %    Neutro Abs 3.4  1.7 - 7.7 K/uL    Lymphocytes Relative 34  12 -  46 %    Lymphs Abs 2.0  0.7 - 4.0 K/uL    Monocytes Relative 6  3 - 12 %    Monocytes Absolute 0.4  0.1 - 1.0 K/uL    Eosinophils Relative 4  0 - 5 %    Eosinophils Absolute 0.2  0.0 - 0.7 K/uL    Basophils Relative 1  0 - 1 %    Basophils Absolute 0.0  0.0 - 0.1 K/uL   COMPREHENSIVE METABOLIC PANEL     Status: Abnormal   Collection Time   01/05/12  8:45 AM      Component Value Range Comment   Sodium 137  135 - 145 mEq/L    Potassium 4.1  3.5 - 5.1 mEq/L    Chloride 99  96 - 112 mEq/L    CO2 27  19 - 32 mEq/L    Glucose, Bld 106 (*) 70 - 99 mg/dL    BUN 21  6 - 23 mg/dL    Creatinine, Ser 4.54  0.50 - 1.10 mg/dL    Calcium 9.8  8.4 - 09.8 mg/dL    Total Protein 7.3  6.0 - 8.3 g/dL    Albumin 3.9  3.5 - 5.2 g/dL    AST 21  0 - 37 U/L    ALT 21  0 - 35 U/L    Alkaline Phosphatase 105  39 - 117 U/L    Total Bilirubin 0.5  0.3 - 1.2 mg/dL    GFR calc non Af Amer >90  >90 mL/min    GFR calc Af Amer >90  >90 mL/min   URINALYSIS, ROUTINE W REFLEX MICROSCOPIC     Status:  Normal   Collection Time   01/05/12  8:54 AM      Component Value Range Comment   Color, Urine YELLOW  YELLOW    APPearance CLEAR  CLEAR    Specific Gravity, Urine 1.025  1.005 - 1.030    pH 6.0  5.0 - 8.0    Glucose, UA NEGATIVE  NEGATIVE mg/dL    Hgb urine dipstick NEGATIVE  NEGATIVE    Bilirubin Urine NEGATIVE  NEGATIVE    Ketones, ur NEGATIVE  NEGATIVE mg/dL    Protein, ur NEGATIVE  NEGATIVE mg/dL    Urobilinogen, UA 0.2  0.0 - 1.0 mg/dL    Nitrite NEGATIVE  NEGATIVE    Leukocytes, UA NEGATIVE  NEGATIVE MICROSCOPIC NOT DONE ON URINES WITH NEGATIVE PROTEIN, BLOOD, LEUKOCYTES, NITRITE, OR GLUCOSE <1000 mg/dL.  HCG, SERUM, QUALITATIVE     Status: Normal   Collection Time   01/05/12  9:30 AM      Component Value Range Comment   Preg, Serum NEGATIVE  NEGATIVE    No results found.  Review of Systems  Musculoskeletal: Positive for joint pain (Pain right knee with swelling, popping for about nine months or so.  Getting gradually worse.).  All other systems reviewed and are negative.    There were no vitals taken for this visit. Physical Exam  Constitutional: She is oriented to person, place, and time. She appears well-developed and well-nourished.  HENT:  Head: Normocephalic.  Eyes: Conjunctivae normal and EOM are normal. Pupils are equal, round, and reactive to light.  Neck: Normal range of motion. Neck supple.  Cardiovascular: Normal rate, regular rhythm and intact distal pulses.   Respiratory: Breath sounds normal.  GI: Soft. Bowel sounds are normal.  Musculoskeletal: She exhibits tenderness (Pain right knee medially with effusion.  Stability OK.  Positive  medial [place].).       Right knee: She exhibits swelling and effusion. tenderness found. Medial joint line tenderness noted.       Legs: Neurological: She is alert and oriented to person, place, and time. She has normal reflexes.  Skin: Skin is warm and dry.  Psychiatric: She has a normal mood and affect. Her behavior  is normal. Judgment and thought content normal.     Assessment/Plan Right knee medial meniscus tear.  For arthroscopy on the right knee.  Plan physical therapy post operative as outpatient.  Brison Fiumara 01/06/2012, 1:38 PM

## 2012-01-07 ENCOUNTER — Encounter (HOSPITAL_COMMUNITY): Payer: Self-pay | Admitting: Anesthesiology

## 2012-01-07 ENCOUNTER — Ambulatory Visit (HOSPITAL_COMMUNITY): Payer: BC Managed Care – PPO | Admitting: Anesthesiology

## 2012-01-07 ENCOUNTER — Encounter (HOSPITAL_COMMUNITY): Payer: Self-pay | Admitting: *Deleted

## 2012-01-07 ENCOUNTER — Ambulatory Visit (HOSPITAL_COMMUNITY)
Admission: RE | Admit: 2012-01-07 | Discharge: 2012-01-07 | Disposition: A | Discharge status: Home / Self Care | Payer: BC Managed Care – PPO | Source: Ambulatory Visit | Attending: Orthopaedic Surgery | Admitting: Orthopaedic Surgery

## 2012-01-07 ENCOUNTER — Encounter (HOSPITAL_COMMUNITY)
Admission: RE | Disposition: A | Discharge status: Home / Self Care | Payer: Self-pay | Source: Ambulatory Visit | Attending: Orthopaedic Surgery

## 2012-01-07 DIAGNOSIS — Z01810 Encounter for preprocedural cardiovascular examination: Secondary | ICD-10-CM | POA: Insufficient documentation

## 2012-01-07 DIAGNOSIS — Z01812 Encounter for preprocedural laboratory examination: Secondary | ICD-10-CM | POA: Insufficient documentation

## 2012-01-07 DIAGNOSIS — M23305 Other meniscus derangements, unspecified medial meniscus, unspecified knee: Secondary | ICD-10-CM | POA: Insufficient documentation

## 2012-01-07 HISTORY — PX: KNEE ARTHROSCOPY WITH MEDIAL MENISECTOMY: SHX5651

## 2012-01-07 SURGERY — ARTHROSCOPY, KNEE, WITH MEDIAL MENISCECTOMY
Anesthesia: General | Site: Knee | Laterality: Right | Wound class: Clean

## 2012-01-07 MED ORDER — ONDANSETRON HCL 4 MG/2ML IJ SOLN: 4.0000 mg | Freq: Once | INTRAMUSCULAR | Status: DC | PRN

## 2012-01-07 MED ORDER — GLYCOPYRROLATE 0.2 MG/ML IJ SOLN
INTRAMUSCULAR | Status: AC
  Filled 2012-01-07: qty 3

## 2012-01-07 MED ORDER — MIDAZOLAM HCL 2 MG/2ML IJ SOLN
1.0000 mg | INTRAMUSCULAR | Status: DC | PRN
  Administered 2012-01-07: 2 mg via INTRAVENOUS

## 2012-01-07 MED ORDER — NEOSTIGMINE METHYLSULFATE 1 MG/ML IJ SOLN
INTRAMUSCULAR | Status: DC | PRN
  Administered 2012-01-07: 4 mg via INTRAVENOUS

## 2012-01-07 MED ORDER — PROPOFOL 10 MG/ML IV EMUL
INTRAVENOUS | Status: DC | PRN
  Administered 2012-01-07: 150 mg via INTRAVENOUS

## 2012-01-07 MED ORDER — LIDOCAINE HCL (PF) 1 % IJ SOLN
INTRAMUSCULAR | Status: AC
  Filled 2012-01-07: qty 5

## 2012-01-07 MED ORDER — ONDANSETRON HCL 4 MG/2ML IJ SOLN
4.0000 mg | Freq: Once | INTRAMUSCULAR | Status: AC
  Administered 2012-01-07: 4 mg via INTRAVENOUS

## 2012-01-07 MED ORDER — LACTATED RINGERS IV SOLN
INTRAVENOUS | Status: DC
  Administered 2012-01-07: 1000 via INTRAVENOUS

## 2012-01-07 MED ORDER — MIDAZOLAM HCL 2 MG/2ML IJ SOLN
INTRAMUSCULAR | Status: AC
  Filled 2012-01-07: qty 2

## 2012-01-07 MED ORDER — FENTANYL CITRATE 0.05 MG/ML IJ SOLN
INTRAMUSCULAR | Status: DC | PRN
  Administered 2012-01-07: 100 ug via INTRAVENOUS
  Administered 2012-01-07 (×3): 50 ug via INTRAVENOUS

## 2012-01-07 MED ORDER — PROPOFOL 10 MG/ML IV EMUL
INTRAVENOUS | Status: AC
  Filled 2012-01-07: qty 20

## 2012-01-07 MED ORDER — DICLOFENAC SODIUM 75 MG PO TBEC: 75.0000 mg | DELAYED_RELEASE_TABLET | Freq: Two times a day (BID) | ORAL | Status: DC

## 2012-01-07 MED ORDER — FENTANYL CITRATE 0.05 MG/ML IJ SOLN
25.0000 ug | INTRAMUSCULAR | Status: DC | PRN
  Administered 2012-01-07 (×4): 50 ug via INTRAVENOUS

## 2012-01-07 MED ORDER — FENTANYL CITRATE 0.05 MG/ML IJ SOLN
INTRAMUSCULAR | Status: AC
  Filled 2012-01-07: qty 2

## 2012-01-07 MED ORDER — LACTATED RINGERS IV SOLN
INTRAVENOUS | Status: DC | PRN
  Administered 2012-01-07: 09:00:00 via INTRAVENOUS

## 2012-01-07 MED ORDER — SODIUM CHLORIDE 0.9 % IR SOLN
Status: DC | PRN
  Administered 2012-01-07: 1000 mL

## 2012-01-07 MED ORDER — LACTATED RINGERS IR SOLN
Status: DC | PRN
  Administered 2012-01-07 (×3): 3000 mL

## 2012-01-07 MED ORDER — ROCURONIUM BROMIDE 100 MG/10ML IV SOLN
INTRAVENOUS | Status: DC | PRN
  Administered 2012-01-07: 30 mg via INTRAVENOUS

## 2012-01-07 MED ORDER — CHLORHEXIDINE GLUCONATE 4 % EX LIQD: 60.0000 mL | Freq: Once | CUTANEOUS | Status: DC

## 2012-01-07 MED ORDER — FENTANYL CITRATE 0.05 MG/ML IJ SOLN
INTRAMUSCULAR | Status: AC
  Filled 2012-01-07: qty 5

## 2012-01-07 MED ORDER — HYDROCODONE-ACETAMINOPHEN 7.5-325 MG PO TABS: 1.0000 | ORAL_TABLET | ORAL | Status: DC | PRN

## 2012-01-07 MED ORDER — NEOSTIGMINE METHYLSULFATE 1 MG/ML IJ SOLN
INTRAMUSCULAR | Status: AC
  Filled 2012-01-07: qty 10

## 2012-01-07 MED ORDER — BUPIVACAINE HCL (PF) 0.25 % IJ SOLN
INTRAMUSCULAR | Status: DC | PRN
  Administered 2012-01-07: 30 mL

## 2012-01-07 MED ORDER — ROCURONIUM BROMIDE 50 MG/5ML IV SOLN
INTRAVENOUS | Status: AC
  Filled 2012-01-07: qty 1

## 2012-01-07 MED ORDER — LIDOCAINE HCL 1 % IJ SOLN
INTRAMUSCULAR | Status: DC | PRN
  Administered 2012-01-07: 50 mg via INTRADERMAL

## 2012-01-07 MED ORDER — GLYCOPYRROLATE 0.2 MG/ML IJ SOLN
INTRAMUSCULAR | Status: DC | PRN
  Administered 2012-01-07: .6 mg via INTRAVENOUS

## 2012-01-07 MED ORDER — ONDANSETRON HCL 4 MG/2ML IJ SOLN
INTRAMUSCULAR | Status: AC
  Filled 2012-01-07: qty 2

## 2012-01-07 MED ORDER — BUPIVACAINE HCL (PF) 0.25 % IJ SOLN
INTRAMUSCULAR | Status: AC
  Filled 2012-01-07: qty 30

## 2012-01-07 SURGICAL SUPPLY — 62 items
BAG HAMPER (MISCELLANEOUS) ×3 IMPLANT
BANDAGE ELASTIC 6 VELCRO NS (GAUZE/BANDAGES/DRESSINGS) ×3 IMPLANT
BANDAGE ESMARK 4X12 BL STRL LF (DISPOSABLE) ×3 IMPLANT
BLADE SURG 15 STRL LF DISP TIS (BLADE) ×2 IMPLANT
BLADE SURG 15 STRL SS (BLADE) ×3
BLADE SURG SZ11 CARB STEEL (BLADE) ×1 IMPLANT
BNDG CMPR 12X4 ELC STRL LF (DISPOSABLE) ×4
BNDG COHESIVE 4X5 TAN STRL (GAUZE/BANDAGES/DRESSINGS) ×3 IMPLANT
BNDG ESMARK 4X12 BLUE STRL LF (DISPOSABLE) ×6
CHLORAPREP W/TINT 26ML (MISCELLANEOUS) ×3 IMPLANT
CLOTH BEACON ORANGE TIMEOUT ST (SAFETY) ×3 IMPLANT
CUFF TOURNIQUET SINGLE 34IN LL (TOURNIQUET CUFF) ×2 IMPLANT
CUFF TOURNIQUET SINGLE 44IN (TOURNIQUET CUFF) IMPLANT
CUTTER TOMCAT 4.0M (BURR) ×3 IMPLANT
DECANTER SPIKE VIAL GLASS SM (MISCELLANEOUS) ×3 IMPLANT
DRAPE PROXIMA HALF (DRAPES) ×3 IMPLANT
DRSG XEROFORM 1X8 (GAUZE/BANDAGES/DRESSINGS) ×3 IMPLANT
DURAPREP 26ML APPLICATOR (WOUND CARE) ×3 IMPLANT
ELECT MENISCUS 165MM 90D (ELECTRODE) IMPLANT
FORMALIN 10 PREFIL 480ML (MISCELLANEOUS) ×3 IMPLANT
GAUZE SPONGE 4X4 16PLY XRAY LF (GAUZE/BANDAGES/DRESSINGS) ×3 IMPLANT
GLOVE BIO SURGEON STRL SZ8 (GLOVE) ×3 IMPLANT
GLOVE BIO SURGEON STRL SZ8.5 (GLOVE) ×3 IMPLANT
GLOVE BIOGEL PI IND STRL 7.0 (GLOVE) ×1 IMPLANT
GLOVE BIOGEL PI INDICATOR 7.0 (GLOVE) ×1
GLOVE EXAM NITRILE MD LF STRL (GLOVE) ×2 IMPLANT
GLOVE SS BIOGEL STRL SZ 6.5 (GLOVE) ×1 IMPLANT
GLOVE SUPERSENSE BIOGEL SZ 6.5 (GLOVE) ×1
GOWN STRL REIN XL XLG (GOWN DISPOSABLE) ×6 IMPLANT
HANDPIECE (MISCELLANEOUS) IMPLANT
HLDR LEG FOAM (MISCELLANEOUS) ×2 IMPLANT
IV LACTATED RINGER IRRG 3000ML (IV SOLUTION) ×9
IV LR IRRIG 3000ML ARTHROMATIC (IV SOLUTION) ×5 IMPLANT
KIT BLADEGUARD II DBL (SET/KITS/TRAYS/PACK) ×3 IMPLANT
KIT ROOM TURNOVER AP CYSTO (KITS) ×3 IMPLANT
KNIFE HOOK (BLADE) IMPLANT
KNIFE MENISECTOMY (BLADE) IMPLANT
LEG HOLDER FOAM (MISCELLANEOUS) ×1
MANIFOLD NEPTUNE II (INSTRUMENTS) ×3 IMPLANT
MANIFOLD NEPTUNE WASTE (CANNULA) ×3 IMPLANT
MARKER SKIN DUAL TIP RULER LAB (MISCELLANEOUS) ×3 IMPLANT
NDL HYPO 21X1.5 SAFETY (NEEDLE) ×1 IMPLANT
NDL SPNL 18GX3.5 QUINCKE PK (NEEDLE) ×1 IMPLANT
NEEDLE HYPO 21X1.5 SAFETY (NEEDLE) ×3 IMPLANT
NEEDLE SPNL 18GX3.5 QUINCKE PK (NEEDLE) ×3 IMPLANT
NS IRRIG 1000ML POUR BTL (IV SOLUTION) ×3 IMPLANT
PACK ARTHRO LIMB DRAPE STRL (MISCELLANEOUS) ×3 IMPLANT
PAD ABD 5X9 TENDERSORB (GAUZE/BANDAGES/DRESSINGS) ×3 IMPLANT
PAD ARMBOARD 7.5X6 YLW CONV (MISCELLANEOUS) ×3 IMPLANT
PADDING CAST COTTON 6X4 STRL (CAST SUPPLIES) ×3 IMPLANT
SET ARTHROSCOPY INST (INSTRUMENTS) ×3 IMPLANT
SET BASIN LINEN APH (SET/KITS/TRAYS/PACK) ×3 IMPLANT
SPONGE GAUZE 4X4 12PLY (GAUZE/BANDAGES/DRESSINGS) ×3 IMPLANT
SUT ETHILON 3 0 FSL (SUTURE) ×3 IMPLANT
SYR 30ML LL (SYRINGE) ×3 IMPLANT
TIP 0 DEGREE (MISCELLANEOUS) IMPLANT
TIP 30 DEGREE (MISCELLANEOUS) IMPLANT
TIP 70 DEGREE (MISCELLANEOUS) IMPLANT
TOWEL OR 17X26 4PK STRL BLUE (TOWEL DISPOSABLE) ×1 IMPLANT
TUBING FLOSTEADY ARTHRO PUMP (IRRIGATION / IRRIGATOR) ×3 IMPLANT
WATER STERILE IRR 1000ML POUR (IV SOLUTION) ×1 IMPLANT
YANKAUER SUCT BULB TIP 10FT TU (MISCELLANEOUS) ×6 IMPLANT

## 2012-01-07 NOTE — Anesthesia Postprocedure Evaluation (Signed)
  Anesthesia Post-op Note  Patient: [name]  Procedure(s) Performed: Procedure(s) (LRB) with comments: KNEE ARTHROSCOPY WITH MEDIAL MENISECTOMY (Right)  Patient Location: PACU  Anesthesia Type:General  Level of Consciousness: awake, alert  and patient cooperative  Airway and Oxygen Therapy: Patient Spontanous Breathing and Patient connected to face mask oxygen  Post-op Pain: mod pain, pain meds given  Post-op Assessment: Post-op Vital signs reviewed, Patient's Cardiovascular Status Stable, Respiratory Function Stable, Patent Airway and No signs of Nausea or vomiting  Post-op Vital Signs: Reviewed and stable  Complications: No apparent anesthesia complications

## 2012-01-07 NOTE — Anesthesia Preprocedure Evaluation (Signed)
Anesthesia Evaluation  Patient identified by MRN, date of birth, ID band Patient awake    Reviewed: Allergy & Precautions, H&P , NPO status , Patient's Chart, lab work & pertinent test results  History of Anesthesia Complications Negative for: history of anesthetic complications  Airway Mallampati: I TM Distance: >3 FB     Dental  (+) Teeth Intact   Pulmonary  breath sounds clear to auscultation        Cardiovascular hypertension, Pt. on medications Rhythm:Regular Rate:Normal     Neuro/Psych Anxiety    GI/Hepatic GERD-  Medicated and Controlled,  Endo/Other    Renal/GU      Musculoskeletal   Abdominal   Peds  Hematology   Anesthesia Other Findings   Reproductive/Obstetrics                           Anesthesia Physical Anesthesia Plan  ASA: II  Anesthesia Plan: General   Post-op Pain Management:    Induction: Intravenous, Rapid sequence and Cricoid pressure planned  Airway Management Planned: Oral ETT  Additional Equipment:   Intra-op Plan:   Post-operative Plan: Extubation in OR  Informed Consent: I have reviewed the patients History and Physical, chart, labs and discussed the procedure including the risks, benefits and alternatives for the proposed anesthesia with the patient or authorized representative who has indicated his/her understanding and acceptance.     Plan Discussed with:   Anesthesia Plan Comments:         Anesthesia Quick Evaluation

## 2012-01-07 NOTE — Preoperative (Signed)
Beta Blockers   Reason not to administer Beta Blockers:Not Applicable 

## 2012-01-07 NOTE — Brief Op Note (Signed)
01/07/2012  9:54 AM  PATIENT:  [name]  51 y.o. female  PRE-OPERATIVE DIAGNOSIS:  tear medial meniscus right knee  POST-OPERATIVE DIAGNOSIS:  tear medial meniscus right knee  PROCEDURE:  Procedure(s) (LRB) with comments: KNEE ARTHROSCOPY WITH MEDIAL MENISECTOMY (Right) partial  SURGEON:  Surgeon(s) and Role:    * Darreld Mclean, MD - Primary  PHYSICIAN ASSISTANT:   ASSISTANTS: none   ANESTHESIA:   general  EBL:     BLOOD ADMINISTERED:none  DRAINS: none   LOCAL MEDICATIONS USED:  MARCAINE     SPECIMEN:  Source of Specimen:  right knee medial meniscus fragments  DISPOSITION OF SPECIMEN:  PATHOLOGY  COUNTS:  YES  TOURNIQUET:  * Missing tourniquet times found for documented tourniquets in log:  04540 *  DICTATION: .Other Dictation: Dictation Number [id number]  PLAN OF CARE: Discharge to home after PACU  PATIENT DISPOSITION:  PACU - hemodynamically stable.   Delay start of Pharmacological VTE agent (>24hrs) due to surgical blood loss or risk of bleeding: not applicable

## 2012-01-07 NOTE — Transfer of Care (Signed)
Immediate Anesthesia Transfer of Care Note  Patient: [name]  Procedure(s) Performed: Procedure(s) (LRB) with comments: KNEE ARTHROSCOPY WITH MEDIAL MENISECTOMY (Right)  Patient Location: PACU  Anesthesia Type:General  Level of Consciousness: awake  Airway & Oxygen Therapy: Patient Spontanous Breathing and Patient connected to nasal cannula oxygen  Post-op Assessment: Report given to PACU RN and Post -op Vital signs reviewed and stable  Post vital signs: Reviewed and stable  Complications: No apparent anesthesia complications

## 2012-01-07 NOTE — Op Note (Signed)
NAMEGRACY, [name]               ACCOUNT NO.:  [id number]  MEDICAL RECORD NO.:  [id number]  LOCATION:  [facility]                          FACILITY:  [facility]  PHYSICIAN:  J. Darreld Mclean, M.D. DATE OF BIRTH:  1960/12/24  DATE OF PROCEDURE: DATE OF DISCHARGE:  01/07/2012                              OPERATIVE REPORT   PREOPERATIVE DIAGNOSIS:  Tear of medial meniscus, right knee.  POSTOPERATIVE DIAGNOSIS:  Tear of medial meniscus, right knee.  PROCEDURE:  Operative arthroscopy of the right knee, partial medial meniscectomy.  ANESTHESIA:  General.  TOURNIQUET TIME:  23 minutes.  SURGEON:  J. Darreld Mclean, MD  DRAINS:  No drains.  INDICATION:  The patient is a 51 year old female who has had pain and tenderness in the knee for good 9-12 months.  It has gotten progressively worse.  She had swelling, pain, and popping.  MRI done in April showed a tear of the medial meniscus of the knee plus degenerative joint disease present.  The patient has been on diclofenac, pain medicine, has done fairly well until recently and has been getting progressively worse.  She now elects to have the elective procedure of the arthroscopy.  I had already gone over the risks and imponderables with the patient including anesthesia risk.  The patient appeared to understand and agreed with the procedure as outlined.  DESCRIPTION OF PROCEDURE:  The patient was seen in the holding area, identified the right knee as correct surgical site.  I placed a mark on the right knee.  She placed a mark on the right knee.  She was brought to the operating room, given general anesthesia while supine. Tourniquet leg holder placed, deflated right upper thigh.  She was prepped and draped in usual manner.  A generalized time-out identifying the patient, [name], doing her right knee for arthroscopy.  OR team knew each other.  All instrumentation was properly positioned and working.  Leg was elevated, wrapped  circumferentially with Esmarch bandage.  Tourniquet inflated to 300 mmHg.  Esmarch bandage removed. Inflow cannula inserted into the medial side of the right knee and lactated Ringer's was infused on infusion pump.  Arthroscope inserted laterally, the knee systematically examined.  Suprapatellar pouch with some mild degenerative changes on surface of patella, grade 2-3 changes. Medially, there was significant grade 4 eburnation changes on the tibial plateau and also the femoral condyle, particularly most medial.  There was tear of the posterior horn of medial meniscus.  There were no loose fragments or loose bodies.  Anterior cruciate was intact laterally with grade 2 changes with some slight fraying of the meniscus but no tear. Attention directed to the medial side.  Again using meniscal punch and meniscal shaver, good smooth contour was obtained.  Permanent pictures were taken throughout the procedure.  Knee was systematically reexamined and no new pathology found.  Wound was irrigated with remaining part of lactated Ringer's.  Wound was reapproximated using 3-0 nylon interrupted vertical mattress manner.  Marcaine 0.25% instilled to each portal. Total tourniquet time was 23 minutes.  The patient tolerated the procedure well, will go to recovery in good condition.  Appropriate analgesia will be given for pain.  I will  see the patient in the office in approximately 10 days.  Physical therapy has been arranged postop starting next week.          ______________________________ J. Darreld Mclean, M.D.     JWK/MEDQ  D:  01/07/2012  T:  01/07/2012  Job:  [id number]

## 2012-01-07 NOTE — Progress Notes (Signed)
The History and Physical is unchanged. I have examined the patient. The patient is medically able to have surgery on the right knee . [name] 

## 2012-01-07 NOTE — Anesthesia Procedure Notes (Signed)
Procedure Name: Intubation Date/Time: 01/07/2012 9:14 AM Performed by: Glendora Score A Pre-anesthesia Checklist: Patient identified, Emergency Drugs available, Suction available and Patient being monitored Patient Re-evaluated:Patient Re-evaluated prior to inductionOxygen Delivery Method: Circle system utilized Preoxygenation: Pre-oxygenation with 100% oxygen Intubation Type: IV induction, Cricoid Pressure applied and Rapid sequence Ventilation: Mask ventilation without difficulty Laryngoscope Size: Miller and 2 Grade View: Grade I Tube type: Oral Tube size: 7.0 mm Number of attempts: 1 Airway Equipment and Method: Stylet Placement Confirmation: ETT inserted through vocal cords under direct vision,  positive ETCO2 and breath sounds checked- equal and bilateral Secured at: 21 cm Tube secured with: Tape Dental Injury: Teeth and Oropharynx as per pre-operative assessment

## 2012-01-07 NOTE — Addendum Note (Signed)
Addendum  created 01/07/12 1025 by Marolyn Hammock, CRNA   Modules edited:Charges VN

## 2012-01-11 ENCOUNTER — Encounter (HOSPITAL_COMMUNITY): Payer: Self-pay | Admitting: Orthopaedic Surgery

## 2012-01-11 ENCOUNTER — Ambulatory Visit (HOSPITAL_COMMUNITY): Payer: BC Managed Care – PPO | Admitting: Physical Therapy

## 2013-10-02 ENCOUNTER — Other Ambulatory Visit (HOSPITAL_COMMUNITY): Payer: Self-pay | Admitting: Orthopaedic Surgery

## 2013-10-02 DIAGNOSIS — M5441 Lumbago with sciatica, right side: Secondary | ICD-10-CM

## 2013-10-05 ENCOUNTER — Ambulatory Visit (HOSPITAL_COMMUNITY): Payer: BC Managed Care – PPO

## 2013-12-22 ENCOUNTER — Ambulatory Visit (HOSPITAL_COMMUNITY): Payer: BC Managed Care – PPO

## 2015-04-04 ENCOUNTER — Encounter: Payer: Self-pay | Admitting: Orthopaedic Surgery

## 2015-04-04 ENCOUNTER — Ambulatory Visit (INDEPENDENT_AMBULATORY_CARE_PROVIDER_SITE_OTHER): Payer: BLUE CROSS/BLUE SHIELD | Admitting: Orthopaedic Surgery

## 2015-04-04 VITALS — BP 130/81 | HR 70 | Temp 97.9°F | Ht 61.0 in | Wt 175.8 lb

## 2015-04-04 DIAGNOSIS — M25561 Pain in right knee: Secondary | ICD-10-CM | POA: Diagnosis not present

## 2015-04-04 MED ORDER — DICLOFENAC SODIUM 75 MG PO TBEC: 75.0000 mg | DELAYED_RELEASE_TABLET | Freq: Two times a day (BID) | ORAL | Status: DC

## 2015-04-04 MED ORDER — HYDROCODONE-ACETAMINOPHEN 7.5-325 MG PO TABS: 1.0000 | ORAL_TABLET | ORAL | Status: DC | PRN

## 2015-04-06 DIAGNOSIS — M25561 Pain in right knee: Secondary | ICD-10-CM | POA: Insufficient documentation

## 2015-04-06 NOTE — Patient Instructions (Signed)
Call if any problem  Consider MRI if she has true giving way.

## 2015-04-06 NOTE — Progress Notes (Signed)
[name], female DOB:06-29-1960, 55 y.o. AVW:[id number]  Chief Complaint  Patient presents with  . Knee Pain    Right knee "has good days and bad days, gives away"    HPI  [name] is a 55 y.o. female with chronic right knee pain and swelling and popping.  She has no giving way and no locking. She told the nurse it gives way but on further questioning she has no true giving way.  She says she feels weak but has no giving way.   She is taking her medicine.  Knee Pain  The pain is present in the right knee. The quality of the pain is described as aching and burning. The pain is at a severity of 4/10. The pain is mild. The pain has been fluctuating since onset. Associated symptoms include an inability to bear weight and a loss of motion. The symptoms are aggravated by weight bearing. She has tried acetaminophen, ice and NSAIDs for the symptoms. The treatment provided mild relief.    Body mass index is 33.23 kg/(m^2).  Review of Systems  Patient does not have Diabetes Mellitus. Patient does have hypertension. Patient does not have COPD or shortness of breath. Patient does not have BMI > 35. Patient does not have current smoking history.  Review of Systems  Cardiovascular:       Hypertension   Gastrointestinal:       GERD and reflux  Musculoskeletal: Positive for joint swelling (right knee).  All other systems reviewed and are negative.   Past Medical History  Diagnosis Date  . S/P endoscopy     2008: noncritical Schatzki ring s/p dilation, normal esophagus and stomach, 2003: normal esophagus, s/p Maloney dilation, multiple antral erosions consistent with chronic gastritis, no H.pylori,   . GERD (gastroesophageal reflux disease)   . Hypertension   . Anxiety   . Arthritis     Past Surgical History  Procedure Laterality Date  . Cholecystectomy    . Hand surgery      left  . Foot surgery      right  . Cesarean section    . Colonoscopy  10/17/2010   Procedure: COLONOSCOPY;  Surgeon: Corbin Ade, MD;  Location: [facility];  Service: Endoscopy;  Laterality: N/A;  9:30AM  . Esophagogastroduodenoscopy  10/17/2010    Procedure: ESOPHAGOGASTRODUODENOSCOPY (EGD);  Surgeon: Corbin Ade, MD;  Location: [facility];  Service: Endoscopy;  Laterality: N/A;  [name] dilation  10/17/2010    Procedure: [name] DILATION;  Surgeon: Corbin Ade, MD;  Location: [facility];  Service: Endoscopy;  Laterality: N/A;  . Knee arthroscopy with medial menisectomy  01/07/2012    Procedure: KNEE ARTHROSCOPY WITH MEDIAL MENISECTOMY;  Surgeon: Darreld Mclean, MD;  Location: [facility];  Service: Orthopedics;  Laterality: Right;    Family History  Problem Relation Age of Onset  . Cancer Father     esophagus ca, living  . Colon cancer Paternal Uncle     in 20s, early 72s    Social History Social History  Substance Use Topics  . Smoking status: Never Smoker   . Smokeless tobacco: None  . Alcohol Use: Yes     Comment: occasional    No Known Allergies  Current Outpatient Prescriptions  Medication Sig Dispense Refill  . diclofenac (VOLTAREN) 75 MG EC tablet Take 1 tablet (75 mg total) by mouth 2 (two) times daily. 60 tablet 5  . hydrochlorothiazide (HYDRODIURIL) 25 MG tablet Take  25 mg by mouth daily.    . Omega-3 Fatty Acids (FISH OIL) 1000 MG CAPS Take 1 capsule by mouth daily.    Marland Kitchen omeprazole (PRILOSEC) 20 MG capsule Take 20 mg by mouth daily.    Marland Kitchen HYDROcodone-acetaminophen (NORCO) 7.5-325 MG tablet Take 1 tablet by mouth every 4 (four) hours as needed for moderate pain (Must last 30 days.  Do not drive or operate machinery while taking this medicine.). 120 tablet 0  . Pyridoxine HCl (VITAMIN B-6) 500 MG tablet Take 500 mg by mouth 2 (two) times daily. Reported on 04/04/2015    . vitamin B-12 (CYANOCOBALAMIN) 1000 MCG tablet Take 1,000 mcg by mouth daily. Reported on 04/04/2015    . zinc gluconate 50 MG tablet Take 50 mg by mouth daily. Reported on  04/04/2015     No current facility-administered medications for this visit.     Physical Exam  Blood pressure 130/81, pulse 70, temperature 97.9 F (36.6 C), height  (1.549 m), weight 175 lb 12.8 oz (79.742 kg).  Constitutional: overall normal hygiene, normal nutrition, well developed, normal grooming, normal body habitus. Assistive device:none  Musculoskeletal: gait and station Limp right, muscle tone and strength are normal, no tremors or atrophy is present.  .  Neurological: coordination overall normal.  Deep tendon reflex/nerve stretch intact.  Sensation normal.  Cranial nerves II-XII intact.   Skin normal overall no scars, lesions, ulcers or rash es. No psoriasis.  Psychiatric: Alert and oriented x 3.  Recent memory intact, remote memory unclear.  Normal mood and affect. Well groomed.  Good eye contact.  Cardiovascular: overall no swelling, no varicosities, no edema bilaterally, normal temperatures of the legs and arms, no clubbing, cyanosis and good capillary refill.  Lymphatic: palpation is normal.  The right lower extremity is examined:  Inspection:  Thigh:  Non-tender and no defects  Knee has swelling 1+ effusion.                        Joint tenderness is present                        Patient is tender over the medial joint line  Lower Leg:  Has normal appearance and no tenderness or defects  Ankle:  Non-tender and no defects  Foot:  Non-tender and no defects Range of Motion:  Knee:  Range of motion is: 0 to 105                        Crepitus is  present  Ankle:  Range of motion is normal. Strength and Tone:  The right lower extremity has normal strength and tone. Stability:  Knee:  The knee is stable.  Ankle:  The ankle is stable.      PLAN Call if any problems.  Precautions discussed.  Continue current medications.   Return to clinic three months.

## 2015-05-01 ENCOUNTER — Telehealth: Payer: Self-pay | Admitting: Orthopaedic Surgery

## 2015-05-01 MED ORDER — HYDROCODONE-ACETAMINOPHEN 7.5-325 MG PO TABS: 1.0000 | ORAL_TABLET | ORAL | Status: DC | PRN

## 2015-05-01 NOTE — Telephone Encounter (Signed)
Rx done. 

## 2015-05-01 NOTE — Telephone Encounter (Signed)
Patient requesting refill of Hydrocodone 7.5/325mg Qty 120 Tablets °

## 2015-05-29 DIAGNOSIS — I10 Essential (primary) hypertension: Secondary | ICD-10-CM | POA: Diagnosis not present

## 2015-05-29 DIAGNOSIS — H109 Unspecified conjunctivitis: Secondary | ICD-10-CM | POA: Diagnosis not present

## 2015-05-29 DIAGNOSIS — H04209 Unspecified epiphora, unspecified lacrimal gland: Secondary | ICD-10-CM | POA: Diagnosis not present

## 2015-05-29 DIAGNOSIS — J019 Acute sinusitis, unspecified: Secondary | ICD-10-CM | POA: Diagnosis not present

## 2015-05-30 ENCOUNTER — Telehealth: Payer: Self-pay | Admitting: Orthopaedic Surgery

## 2015-05-30 MED ORDER — HYDROCODONE-ACETAMINOPHEN 7.5-325 MG PO TABS: 1.0000 | ORAL_TABLET | ORAL | Status: DC | PRN

## 2015-05-30 NOTE — Telephone Encounter (Signed)
Patient called and requested a refill on Norco  7.5-325 mgs. Qty 120   Sig: Take 1 tablet by mouth every 4 (four) hours as needed for moderate pain (Must last 30 days.  Do not drive or operate machinery while taking this medicine °

## 2015-05-30 NOTE — Telephone Encounter (Signed)
Rx Done . 

## 2015-07-02 ENCOUNTER — Ambulatory Visit (INDEPENDENT_AMBULATORY_CARE_PROVIDER_SITE_OTHER): Payer: BLUE CROSS/BLUE SHIELD | Admitting: Orthopaedic Surgery

## 2015-07-02 ENCOUNTER — Encounter: Payer: Self-pay | Admitting: Orthopaedic Surgery

## 2015-07-02 VITALS — BP 159/83 | HR 58 | Temp 97.7°F | Resp 16 | Ht 61.0 in | Wt 178.0 lb

## 2015-07-02 DIAGNOSIS — M25561 Pain in right knee: Secondary | ICD-10-CM

## 2015-07-02 DIAGNOSIS — K219 Gastro-esophageal reflux disease without esophagitis: Secondary | ICD-10-CM | POA: Diagnosis not present

## 2015-07-02 MED ORDER — HYDROCODONE-ACETAMINOPHEN 7.5-325 MG PO TABS: 1.0000 | ORAL_TABLET | ORAL | Status: DC | PRN

## 2015-07-02 NOTE — Progress Notes (Signed)
Patient [name]:JWJXB:Tomesha [name], female DOB:July 31, 1960, 55 y.o. JYN:[id number]  Chief Complaint  Patient presents with  . Follow-up    RIGHT KNEE PAIN    HPI  Melton KrebsSusan J Gadsby is a 55 y.o. female who has chronic right knee pain.  She has popping and swelling but no giving way, no locking, no new trauma.  She is taking her medicine and actively working.  She walks without a limp now.  She has pain on cold rainy days.  HPI  Body mass index is 33.65 kg/(m^2).  Review of Systems  HENT: Negative for congestion.   Respiratory: Negative for cough and shortness of breath.   Cardiovascular: Negative for chest pain and leg swelling.       Hypertension   Gastrointestinal:       GERD and reflux  Endocrine: Positive for cold intolerance.  Musculoskeletal: Positive for joint swelling (right knee) and arthralgias.  Allergic/Immunologic: Positive for environmental allergies.  All other systems reviewed and are negative.   Past Medical History  Diagnosis Date  . S/P endoscopy     2008: noncritical Schatzki ring s/p dilation, normal esophagus and stomach, 2003: normal esophagus, s/p Maloney dilation, multiple antral erosions consistent with chronic gastritis, no H.pylori,   . GERD (gastroesophageal reflux disease)   . Hypertension   . Anxiety   . Arthritis     Past Surgical History  Procedure Laterality Date  . Cholecystectomy    . Hand surgery      left  . Foot surgery      right  . Cesarean section    . Colonoscopy  10/17/2010    Procedure: COLONOSCOPY;  Surgeon: Corbin Adeobert M Rourk, MD;  Location: [facility];  Service: Endoscopy;  Laterality: N/A;  9:30AM  . Esophagogastroduodenoscopy  10/17/2010    Procedure: ESOPHAGOGASTRODUODENOSCOPY (EGD);  Surgeon: Corbin Adeobert M Rourk, MD;  Location: [facility];  Service: Endoscopy;  Laterality: N/A;  [name]. Maloney dilation  10/17/2010    Procedure: Elease HashimotoMALONEY DILATION;  Surgeon: Corbin Adeobert M Rourk, MD;  Location: [facility];  Service: Endoscopy;  Laterality: N/A;   . Knee arthroscopy with medial menisectomy  01/07/2012    Procedure: KNEE ARTHROSCOPY WITH MEDIAL MENISECTOMY;  Surgeon: Darreld McleanWayne Brixton Franko, MD;  Location: [facility];  Service: Orthopedics;  Laterality: Right;    Family History  Problem Relation Age of Onset  . Cancer Father     esophagus ca, living  . Colon cancer Paternal Uncle     in 5260s, early 1970s    Social History Social History  Substance Use Topics  . Smoking status: Never Smoker   . Smokeless tobacco: None  . Alcohol Use: Yes     Comment: occasional    No Known Allergies  Current Outpatient Prescriptions  Medication Sig Dispense Refill  . diclofenac (VOLTAREN) 75 MG EC tablet Take 1 tablet (75 mg total) by mouth 2 (two) times daily. 60 tablet 5  . hydrochlorothiazide (HYDRODIURIL) 25 MG tablet Take 25 mg by mouth daily.    Marland Kitchen. HYDROcodone-acetaminophen (NORCO) 7.5-325 MG tablet Take 1 tablet by mouth every 4 (four) hours as needed for moderate pain (Must last 30 days.  Do not drive or operate machinery while taking this medicine.). 120 tablet 0  . Omega-3 Fatty Acids (FISH OIL) 1000 MG CAPS Take 1 capsule by mouth daily.    Marland Kitchen. omeprazole (PRILOSEC) 20 MG capsule Take 20 mg by mouth daily.    . Pyridoxine HCl (VITAMIN B-6) 500 MG tablet Take 500 mg by  mouth 2 (two) times daily. Reported on 04/04/2015    . vitamin B-12 (CYANOCOBALAMIN) 1000 MCG tablet Take 1,000 mcg by mouth daily. Reported on 04/04/2015    . zinc gluconate 50 MG tablet Take 50 mg by mouth daily. Reported on 04/04/2015     No current facility-administered medications for this visit.     Physical Exam  Blood pressure 159/83, pulse 58, temperature 97.7 F (36.5 C), resp. rate 16, height  (1.549 m), weight 178 lb (80.74 kg).  Constitutional: overall normal hygiene, normal nutrition, well developed, normal grooming, normal body habitus. Assistive device:none  Musculoskeletal: gait and station Limp none, muscle tone and strength are normal, no tremors or atrophy  is present.  .  Neurological: coordination overall normal.  Deep tendon reflex/nerve stretch intact.  Sensation normal.  Cranial nerves II-XII intact.   Skin:   normal overall no scars, lesions, ulcers or rashes. No psoriasis.  Psychiatric: Alert and oriented x 3.  Recent memory intact, remote memory unclear.  Normal mood and affect. Well groomed.  Good eye contact.  Cardiovascular: overall no swelling, no varicosities, no edema bilaterally, normal temperatures of the legs and arms, no clubbing, cyanosis and good capillary refill.  Lymphatic: palpation is normal.  Left knee normal.  The right lower extremity is examined:  Inspection:  Thigh:  Non-tender and no defects  Knee has swelling 1+ effusion.                        Joint tenderness is present                        Patient is tender over the medial joint line  Lower Leg:  Has normal appearance and no tenderness or defects  Ankle:  Non-tender and no defects  Foot:  Non-tender and no defects Range of Motion:  Knee:  Range of motion is: 0-105                        Crepitus is  present  Ankle:  Range of motion is normal. Strength and Tone:  The right lower extremity has normal strength and tone. Stability:  Knee:  The knee is stable.  Ankle:  The ankle is stable.  The patient has been educated about the nature of the problem(s) and counseled on treatment options.  The patient appeared to understand what I have discussed and is in agreement with it.  Encounter Diagnoses  Name Primary?  . Knee pain, right Yes  . Gastroesophageal reflux disease, esophagitis presence not specified     PLAN Call if any problems.  Precautions discussed.  Continue current medications.   Return to clinic 3 months

## 2015-07-04 ENCOUNTER — Ambulatory Visit: Payer: BLUE CROSS/BLUE SHIELD | Admitting: Orthopaedic Surgery

## 2015-08-01 ENCOUNTER — Telehealth: Payer: Self-pay | Admitting: Orthopaedic Surgery

## 2015-08-01 MED ORDER — HYDROCODONE-ACETAMINOPHEN 7.5-325 MG PO TABS: 1.0000 | ORAL_TABLET | ORAL | Status: DC | PRN

## 2015-08-01 NOTE — Telephone Encounter (Signed)
Rx done. 

## 2015-08-01 NOTE — Telephone Encounter (Signed)
Patient called for refill of medication: HYDROcodone-acetaminophen (NORCO) 7.5-325 MG tablet [[id number]][[id number]] - quantity 120.

## 2015-08-07 DIAGNOSIS — Z634 Disappearance and death of family member: Secondary | ICD-10-CM | POA: Diagnosis not present

## 2015-08-07 DIAGNOSIS — Z1151 Encounter for screening for human papillomavirus (HPV): Secondary | ICD-10-CM | POA: Diagnosis not present

## 2015-08-07 DIAGNOSIS — Z6833 Body mass index (BMI) 33.0-33.9, adult: Secondary | ICD-10-CM | POA: Diagnosis not present

## 2015-08-07 DIAGNOSIS — Z01419 Encounter for gynecological examination (general) (routine) without abnormal findings: Secondary | ICD-10-CM | POA: Diagnosis not present

## 2015-08-07 DIAGNOSIS — R921 Mammographic calcification found on diagnostic imaging of breast: Secondary | ICD-10-CM | POA: Diagnosis not present

## 2015-09-02 ENCOUNTER — Telehealth: Payer: Self-pay | Admitting: Orthopaedic Surgery

## 2015-09-02 MED ORDER — HYDROCODONE-ACETAMINOPHEN 5-325 MG PO TABS: 1.0000 | ORAL_TABLET | ORAL | Status: DC | PRN

## 2015-09-02 NOTE — Telephone Encounter (Signed)
Rx done. 

## 2015-09-02 NOTE — Telephone Encounter (Signed)
Patient called and requested a refill on Hydrocodone-Acetaminophen (Norco)  7.5-325 mgs. Qty 120 Sig: Take 1 tablet by mouth every 4 (four) hours as needed for moderate pain (Must last 30 days.  Do not drive or operate machinery while taking this medicine.). °

## 2015-10-02 ENCOUNTER — Ambulatory Visit (INDEPENDENT_AMBULATORY_CARE_PROVIDER_SITE_OTHER): Payer: BLUE CROSS/BLUE SHIELD | Admitting: Orthopaedic Surgery

## 2015-10-02 ENCOUNTER — Encounter: Payer: Self-pay | Admitting: Orthopaedic Surgery

## 2015-10-02 VITALS — BP 167/82 | HR 67 | Temp 97.9°F | Ht 61.0 in | Wt 178.9 lb

## 2015-10-02 DIAGNOSIS — F329 Major depressive disorder, single episode, unspecified: Secondary | ICD-10-CM | POA: Diagnosis not present

## 2015-10-02 DIAGNOSIS — M25561 Pain in right knee: Secondary | ICD-10-CM

## 2015-10-02 DIAGNOSIS — F32A Depression, unspecified: Secondary | ICD-10-CM

## 2015-10-02 MED ORDER — HYDROCODONE-ACETAMINOPHEN 5-325 MG PO TABS: 1.0000 | ORAL_TABLET | ORAL | 0 refills | Status: DC | PRN

## 2015-10-02 NOTE — Progress Notes (Signed)
Patient [name], female DOB:01/31/1961, 55 y.o. AVW:[id number]  Chief Complaint  Patient presents with  . Follow-up    Right Knee    HPI  [name] is a 55 y.o. female who has chronic right knee pain.  She has some giving way of the knee now and then.  She has no new trauma, no redness, no locking.  She has swelling and popping.  She lost her daughter recently.  It was a possible suicide.  It is being investigated even now.  She has gotten depressed from this.  She began crying in the office and told me about the circumstances of the death.  She has talked to her family doctor and got Xanax originally several months ago.  She only takes it now and then.  More recently she gets very depressed and crys and does not know what to do.  She has an appointment to see her family doctor next week.  I offered to call her doctor today and see if they could see the patient today.  She says she will keep her appointment.  I have offered to contact Mental Health as well and told her about group therapy discussions they offer.  She is interested in this.  She says she will contact Mental Health.  I told her if they were unable to "work her in" or if she had any problem, to contact me and I would see what I could do.  She was very appreciative of this.  I spent some 30 minutes with her today talking about this and letting her cry and just talk about the situation.   HPI  Body mass index is 33.8 kg/m.  ROS  Review of Systems  HENT: Negative for congestion.   Respiratory: Negative for cough and shortness of breath.   Cardiovascular: Negative for chest pain and leg swelling.       Hypertension   Gastrointestinal:       GERD and reflux  Endocrine: Positive for cold intolerance.  Musculoskeletal: Positive for arthralgias and joint swelling (right knee).  Allergic/Immunologic: Positive for environmental allergies.  All other systems reviewed and are negative.   Past Medical History:   Diagnosis Date  . Anxiety   . Arthritis   . GERD (gastroesophageal reflux disease)   . Hypertension   . S/P endoscopy    2008: noncritical Schatzki ring s/p dilation, normal esophagus and stomach, 2003: normal esophagus, s/p Maloney dilation, multiple antral erosions consistent with chronic gastritis, no H.pylori,     Past Surgical History:  Procedure Laterality Date  . CESAREAN SECTION    . CHOLECYSTECTOMY    . COLONOSCOPY  10/17/2010   Procedure: COLONOSCOPY;  Surgeon: Corbin Ade, MD;  [facility]: [facility];  Service: Endoscopy;  Laterality: N/A;  9:30AM  . ESOPHAGOGASTRODUODENOSCOPY  10/17/2010   Procedure: ESOPHAGOGASTRODUODENOSCOPY (EGD);  Surgeon: Corbin Ade, MD;  Location: [facility];  Service: Endoscopy;  Laterality: N/A;  . FOOT SURGERY     right  . HAND SURGERY     left  . KNEE ARTHROSCOPY WITH MEDIAL MENISECTOMY  01/07/2012   Procedure: KNEE ARTHROSCOPY WITH MEDIAL MENISECTOMY;  Surgeon: Darreld Mclean, MD;  Location: [facility];  Service: Orthopedics;  Laterality: Right;  . MALONEY DILATION  10/17/2010   Procedure: [name] DILATION;  Surgeon: Corbin Ade, MD;  Location: [facility];  Service: Endoscopy;  Laterality: N/A;    Family History  Problem Relation Age of Onset  . Cancer Father  esophagus ca, living  . Colon cancer Paternal Uncle     in 7260s, early 7770s    Social History Social History  Substance Use Topics  . Smoking status: Never Smoker  . Smokeless tobacco: Never Used  . Alcohol use Yes     Comment: occasional    No Known Allergies  Current Outpatient Prescriptions  Medication Sig Dispense Refill  . ALPRAZolam (XANAX) 0.25 MG tablet Take 0.25 mg by mouth at bedtime as needed for anxiety.    . diclofenac (VOLTAREN) 75 MG EC tablet Take 1 tablet (75 mg total) by mouth 2 (two) times daily. 60 tablet 5  . hydrochlorothiazide (HYDRODIURIL) 25 MG tablet Take 25 mg by mouth daily.    Marland Kitchen. HYDROcodone-acetaminophen (NORCO/VICODIN) 5-325 MG  tablet Take 1 tablet by mouth every 4 (four) hours as needed for moderate pain (Must last 30 days.  Do not take and drive a car or use machinery.). 120 tablet 0  . Omega-3 Fatty Acids (FISH OIL) 1000 MG CAPS Take 1 capsule by mouth daily.    Marland Kitchen. omeprazole (PRILOSEC) 20 MG capsule Take 20 mg by mouth daily.    . Pyridoxine HCl (VITAMIN B-6) 500 MG tablet Take 500 mg by mouth 2 (two) times daily. Reported on 04/04/2015    . vitamin B-12 (CYANOCOBALAMIN) 1000 MCG tablet Take 1,000 mcg by mouth daily. Reported on 04/04/2015    . zinc gluconate 50 MG tablet Take 50 mg by mouth daily. Reported on 04/04/2015     No current facility-administered medications for this visit.      Physical Exam  Blood pressure (!) 167/82, pulse 67, temperature 97.9 F (36.6 C), height 5\' 1"  (1.549 m), weight 178 lb 14.4 oz (81.1 kg).  Constitutional: overall normal hygiene, normal nutrition, well developed, normal grooming, normal body habitus. Assistive device:none  Musculoskeletal: gait and station Limp right, muscle tone and strength are normal, no tremors or atrophy is present.  .  Neurological: coordination overall normal.  Deep tendon reflex/nerve stretch intact.  Sensation normal.  Cranial nerves II-XII intact.   Skin:   normal overall no scars, lesions, ulcers or rashes. No psoriasis.  Psychiatric: Alert and oriented x 3.  Recent memory intact, remote memory unclear.  Normal mood and affect. Well groomed.  Good eye contact.  Cardiovascular: overall no swelling, no varicosities, no edema bilaterally, normal temperatures of the legs and arms, no clubbing, cyanosis and good capillary refill.  Lymphatic: palpation is normal.  The right lower extremity is examined:  Inspection:  Thigh:  Non-tender and no defects  Knee has swelling 1+ effusion.                        Joint tenderness is present                        Patient is tender over the medial joint line  Lower Leg:  Has normal appearance and no  tenderness or defects  Ankle:  Non-tender and no defects  Foot:  Non-tender and no defects Range of Motion:  Knee:  Range of motion is: 0-110                        Crepitus is  present  Ankle:  Range of motion is normal. Strength and Tone:  The right lower extremity has normal strength and tone. Stability:  Knee:  The knee is stable.  Ankle:  The  ankle is stable.    The patient has been educated about the nature of the problem(s) and counseled on treatment options.  The patient appeared to understand what I have discussed and is in agreement with it.  Encounter Diagnoses  Name Primary?  . Knee pain, right Yes  . Acute depression     PLAN Call if any problems.  Precautions discussed.  Continue current medications.   Return to clinic 3 months   Call me if any assistance needed for Mental Health appointment.  Electronically Signed Darreld Mclean, MD 8/9/20179:22 AM

## 2015-10-22 DIAGNOSIS — R51 Headache: Secondary | ICD-10-CM | POA: Diagnosis not present

## 2015-10-22 DIAGNOSIS — R0789 Other chest pain: Secondary | ICD-10-CM | POA: Diagnosis not present

## 2015-10-22 DIAGNOSIS — E669 Obesity, unspecified: Secondary | ICD-10-CM | POA: Diagnosis not present

## 2015-10-22 DIAGNOSIS — Z79899 Other long term (current) drug therapy: Secondary | ICD-10-CM | POA: Diagnosis not present

## 2015-10-22 DIAGNOSIS — R61 Generalized hyperhidrosis: Secondary | ICD-10-CM | POA: Diagnosis not present

## 2015-10-22 DIAGNOSIS — R079 Chest pain, unspecified: Secondary | ICD-10-CM | POA: Diagnosis not present

## 2015-10-22 DIAGNOSIS — R030 Elevated blood-pressure reading, without diagnosis of hypertension: Secondary | ICD-10-CM | POA: Diagnosis not present

## 2015-10-22 DIAGNOSIS — Z8249 Family history of ischemic heart disease and other diseases of the circulatory system: Secondary | ICD-10-CM | POA: Diagnosis not present

## 2015-10-22 DIAGNOSIS — R42 Dizziness and giddiness: Secondary | ICD-10-CM | POA: Diagnosis not present

## 2015-10-30 ENCOUNTER — Telehealth: Payer: Self-pay | Admitting: Orthopaedic Surgery

## 2015-10-30 NOTE — Telephone Encounter (Signed)
Patient requests refills on: (1) HYDROcodone-acetaminophen (NORCO/VICODIN) 5-325 MG tablet 120 tablet   and (2) diclofenac (VOLTAREN) 75 MG EC tablet 60 tablet

## 2015-10-31 MED ORDER — DICLOFENAC SODIUM 75 MG PO TBEC: 75.0000 mg | DELAYED_RELEASE_TABLET | Freq: Two times a day (BID) | ORAL | 5 refills | Status: DC

## 2015-10-31 MED ORDER — HYDROCODONE-ACETAMINOPHEN 5-325 MG PO TABS: 1.0000 | ORAL_TABLET | ORAL | 0 refills | Status: DC | PRN

## 2015-11-05 DIAGNOSIS — H9202 Otalgia, left ear: Secondary | ICD-10-CM | POA: Diagnosis not present

## 2015-11-05 DIAGNOSIS — H6122 Impacted cerumen, left ear: Secondary | ICD-10-CM | POA: Diagnosis not present

## 2015-12-03 ENCOUNTER — Telehealth: Payer: Self-pay | Admitting: Orthopaedic Surgery

## 2015-12-03 MED ORDER — HYDROCODONE-ACETAMINOPHEN 5-325 MG PO TABS: 1.0000 | ORAL_TABLET | ORAL | 0 refills | Status: DC | PRN

## 2015-12-03 NOTE — Telephone Encounter (Signed)
Hydrocodone-Acetaminophen 5/325mg   Qty 110

## 2015-12-31 ENCOUNTER — Ambulatory Visit (INDEPENDENT_AMBULATORY_CARE_PROVIDER_SITE_OTHER): Payer: BLUE CROSS/BLUE SHIELD | Admitting: Orthopaedic Surgery

## 2015-12-31 ENCOUNTER — Encounter: Payer: Self-pay | Admitting: Orthopaedic Surgery

## 2015-12-31 VITALS — BP 150/81 | HR 67 | Temp 96.8°F | Ht 61.0 in | Wt 183.0 lb

## 2015-12-31 DIAGNOSIS — K219 Gastro-esophageal reflux disease without esophagitis: Secondary | ICD-10-CM | POA: Diagnosis not present

## 2015-12-31 DIAGNOSIS — M25561 Pain in right knee: Secondary | ICD-10-CM

## 2015-12-31 DIAGNOSIS — G8929 Other chronic pain: Secondary | ICD-10-CM | POA: Diagnosis not present

## 2015-12-31 DIAGNOSIS — F32A Depression, unspecified: Secondary | ICD-10-CM

## 2015-12-31 DIAGNOSIS — F329 Major depressive disorder, single episode, unspecified: Secondary | ICD-10-CM

## 2015-12-31 MED ORDER — HYDROCODONE-ACETAMINOPHEN 5-325 MG PO TABS: 1.0000 | ORAL_TABLET | ORAL | 0 refills | Status: DC | PRN

## 2015-12-31 NOTE — Progress Notes (Signed)
Patient QM:VHQIO:[name], female DOB:01/02/1961, 55 y.o. NGE:[id number]  Chief Complaint  Patient presents with  . Follow-up    knee pain    HPI  [name] is a 55 y.o. female who has chronic pain of the right knee. She is stable. She has no new trauma. She has swelling and popping but no giving way or redness or numbness.    She has been going to grief counseling after the death of her daughter.  She is better but she still has that huge void in her life. HPI  Body mass index is 34.58 kg/m.  ROS  Review of Systems  HENT: Negative for congestion.   Respiratory: Negative for cough and shortness of breath.   Cardiovascular: Negative for chest pain and leg swelling.       Hypertension   Gastrointestinal:       GERD and reflux  Endocrine: Positive for cold intolerance.  Musculoskeletal: Positive for arthralgias and joint swelling (right knee).  Allergic/Immunologic: Positive for environmental allergies.  All other systems reviewed and are negative.   Past Medical History:  Diagnosis Date  . Anxiety   . Arthritis   . GERD (gastroesophageal reflux disease)   . Hypertension   . S/P endoscopy    2008: noncritical Schatzki ring s/p dilation, normal esophagus and stomach, 2003: normal esophagus, s/p Maloney dilation, multiple antral erosions consistent with chronic gastritis, no H.pylori,     Past Surgical History:  Procedure Laterality Date  . CESAREAN SECTION    . CHOLECYSTECTOMY    . COLONOSCOPY  10/17/2010   Procedure: COLONOSCOPY;  Surgeon: Corbin Adeobert M Rourk, MD;  [facility];  Service: Endoscopy;  Laterality: N/A;  9:30AM  . ESOPHAGOGASTRODUODENOSCOPY  10/17/2010   Procedure: ESOPHAGOGASTRODUODENOSCOPY (EGD);  Surgeon: Corbin Adeobert M Rourk, MD;  [facility]: [facility];  Service: Endoscopy;  Laterality: N/A;  . FOOT SURGERY     right  . HAND SURGERY     left  . KNEE ARTHROSCOPY WITH MEDIAL MENISECTOMY  01/07/2012   Procedure: KNEE ARTHROSCOPY WITH MEDIAL  MENISECTOMY;  Surgeon: Darreld McleanWayne Henrry Feil, MD;  Location: [facility];  Service: Orthopedics;  Laterality: Right;  . MALONEY DILATION  10/17/2010   Procedure: Elease HashimotoMALONEY DILATION;  Surgeon: Corbin Adeobert M Rourk, MD;  [facility];  Service: Endoscopy;  Laterality: N/A;    Family History  Problem Relation Age of Onset  . Cancer Father     esophagus ca, living  . Colon cancer Paternal Uncle     in 760s, early 4870s    Social History Social History  Substance Use Topics  . Smoking status: Never Smoker  . Smokeless tobacco: Never Used  . Alcohol use Yes     Comment: occasional    No Known Allergies  Current Outpatient Prescriptions  Medication Sig Dispense Refill  . ALPRAZolam (XANAX) 0.25 MG tablet Take 0.25 mg by mouth at bedtime as needed for anxiety.    . diclofenac (VOLTAREN) 75 MG EC tablet Take 1 tablet (75 mg total) by mouth 2 (two) times daily. 60 tablet 5  . hydrochlorothiazide (HYDRODIURIL) 25 MG tablet Take 25 mg by mouth daily.    Marland Kitchen. HYDROcodone-acetaminophen (NORCO/VICODIN) 5-325 MG tablet Take 1 tablet by mouth every 4 (four) hours as needed for moderate pain (Must last 30 days.  Do not take and drive a car or use machinery.). 100 tablet 0  . Omega-3 Fatty Acids (FISH OIL) 1000 MG CAPS Take 1 capsule by mouth daily.    .Marland Kitchen  omeprazole (PRILOSEC) 20 MG capsule Take 20 mg by mouth daily.    . Pyridoxine HCl (VITAMIN B-6) 500 MG tablet Take 500 mg by mouth 2 (two) times daily. Reported on 04/04/2015    . vitamin B-12 (CYANOCOBALAMIN) 1000 MCG tablet Take 1,000 mcg by mouth daily. Reported on 04/04/2015    . zinc gluconate 50 MG tablet Take 50 mg by mouth daily. Reported on 04/04/2015     No current facility-administered medications for this visit.      Physical Exam  Blood pressure (!) 150/81, pulse 67, temperature (!) 96.8 F (36 C), height 5\' 1"  (1.549 m), weight 183 lb (83 kg).  Constitutional: overall normal hygiene, normal nutrition, well developed, normal grooming, normal body  habitus. Assistive device:none  Musculoskeletal: gait and station Limp right, muscle tone and strength are normal, no tremors or atrophy is present.  .  Neurological: coordination overall normal.  Deep tendon reflex/nerve stretch intact.  Sensation normal.  Cranial nerves II-XII intact.   Skin:   Normal overall no scars, lesions, ulcers or rashes. No psoriasis.  Psychiatric: Alert and oriented x 3.  Recent memory intact, remote memory unclear.  Normal mood and affect. Well groomed.  Good eye contact.  Cardiovascular: overall no swelling, no varicosities, no edema bilaterally, normal temperatures of the legs and arms, no clubbing, cyanosis and good capillary refill.  Lymphatic: palpation is normal.  The right lower extremity is examined:  Inspection:  Thigh:  Non-tender and no defects  Knee has swelling 1+ effusion.                        Joint tenderness is present                        Patient is tender over the medial joint line  Lower Leg:  Has normal appearance and no tenderness or defects  Ankle:  Non-tender and no defects  Foot:  Non-tender and no defects Range of Motion:  Knee:  Range of motion is: 0-110                        Crepitus is  present  Ankle:  Range of motion is normal. Strength and Tone:  The right lower extremity has normal strength and tone. Stability:  Knee:  The knee is stable.  Ankle:  The ankle is stable.    The patient has been educated about the nature of the problem(s) and counseled on treatment options.  The patient appeared to understand what I have discussed and is in agreement with it.  Encounter Diagnoses  Name Primary?  . Chronic pain of right knee Yes  . Acute depression   . Gastroesophageal reflux disease, esophagitis presence not specified     PLAN Call if any problems.  Precautions discussed.  Continue current medications.   Return to clinic 3 months   Electronically Signed Darreld McleanWayne Charlet Harr, MD 11/7/201711:04 AM

## 2016-01-08 ENCOUNTER — Ambulatory Visit: Payer: BLUE CROSS/BLUE SHIELD | Admitting: Orthopaedic Surgery

## 2016-01-28 ENCOUNTER — Telehealth: Payer: Self-pay | Admitting: Orthopaedic Surgery

## 2016-01-28 MED ORDER — HYDROCODONE-ACETAMINOPHEN 5-325 MG PO TABS: 1.0000 | ORAL_TABLET | ORAL | 0 refills | Status: DC | PRN

## 2016-01-28 NOTE — Telephone Encounter (Signed)
Patient requests refill:  HYDROcodone-acetaminophen (NORCO/VICODIN) 5-325 MG tablet 100 tablet   - states not interested in signing up for MyChart at this time.

## 2016-02-02 DIAGNOSIS — J329 Chronic sinusitis, unspecified: Secondary | ICD-10-CM | POA: Diagnosis not present

## 2016-02-02 DIAGNOSIS — R05 Cough: Secondary | ICD-10-CM | POA: Diagnosis not present

## 2016-02-02 DIAGNOSIS — J310 Chronic rhinitis: Secondary | ICD-10-CM | POA: Diagnosis not present

## 2016-02-19 DIAGNOSIS — R921 Mammographic calcification found on diagnostic imaging of breast: Secondary | ICD-10-CM | POA: Diagnosis not present

## 2016-03-04 ENCOUNTER — Other Ambulatory Visit: Payer: Self-pay | Admitting: *Deleted

## 2016-03-04 ENCOUNTER — Telehealth: Payer: Self-pay | Admitting: Orthopaedic Surgery

## 2016-03-04 MED ORDER — HYDROCODONE-ACETAMINOPHEN 5-325 MG PO TABS: 1.0000 | ORAL_TABLET | ORAL | 0 refills | Status: DC | PRN

## 2016-03-04 NOTE — Telephone Encounter (Signed)
Patient (Dr Sanjuan DameKeeling's) called to request refill (aware that Dr Hilda LiasKeeling is out of office remainder of this week and that Dr Romeo AppleHarrison is reviewing refill requests.  - medication: HYDROcodone-acetaminophen (NORCO/VICODIN) 5-325 MG tablet 90 tablet   - insurance: [employer]

## 2016-03-31 ENCOUNTER — Ambulatory Visit: Payer: BLUE CROSS/BLUE SHIELD | Admitting: Orthopaedic Surgery

## 2016-04-01 ENCOUNTER — Encounter: Payer: Self-pay | Admitting: Orthopaedic Surgery

## 2016-04-01 ENCOUNTER — Ambulatory Visit (INDEPENDENT_AMBULATORY_CARE_PROVIDER_SITE_OTHER): Payer: BLUE CROSS/BLUE SHIELD | Admitting: Orthopaedic Surgery

## 2016-04-01 ENCOUNTER — Telehealth: Payer: Self-pay | Admitting: Orthopedic Surgery

## 2016-04-01 VITALS — BP 153/84 | HR 61 | Temp 97.9°F | Ht 61.0 in | Wt 189.0 lb

## 2016-04-01 DIAGNOSIS — M6530 Trigger finger, unspecified finger: Secondary | ICD-10-CM

## 2016-04-01 DIAGNOSIS — M25561 Pain in right knee: Secondary | ICD-10-CM | POA: Diagnosis not present

## 2016-04-01 DIAGNOSIS — G8929 Other chronic pain: Secondary | ICD-10-CM

## 2016-04-01 MED ORDER — HYDROCODONE-ACETAMINOPHEN 5-325 MG PO TABS: 1.0000 | ORAL_TABLET | ORAL | 0 refills | Status: DC | PRN

## 2016-04-01 NOTE — Telephone Encounter (Signed)
Patient of Dr Sanjuan DameKeeling's per office visit 04/01/16; please advise regarding appointment/consult to discuss surgery for trigger finger.  Patient ph# [phone]

## 2016-04-01 NOTE — Progress Notes (Signed)
Patient [name], female DOB:Sep 30, 1960, 56 y.o. WGN:[id number]  Chief Complaint  Patient presents with  . Follow-up    right knee pain  . Hand Pain    right, and right long trigger finger    HPI  [name] is a 56 y.o. female who has chronic pain of the right knee.  It is stable. She has swelling and popping but no giving way, no trauma.  She has developed a trigger finger of the right long finger.  It locks.  It has been present several months and is getting worse. It locks a lot now.  It locks at work.  I have explained about injection and about surgery.  Surgery is the definitive treatment.  I will inject the finger today and have her see Dr. Romeo Apple for discussion for surgery. HPI  Body mass index is 35.71 kg/m.  ROS  Review of Systems  HENT: Negative for congestion.   Respiratory: Negative for cough and shortness of breath.   Cardiovascular: Negative for chest pain and leg swelling.       Hypertension   Gastrointestinal:       GERD and reflux  Endocrine: Positive for cold intolerance.  Musculoskeletal: Positive for arthralgias and joint swelling (right knee).  Allergic/Immunologic: Positive for environmental allergies.  Psychiatric/Behavioral: The patient is nervous/anxious.   All other systems reviewed and are negative.   Past Medical History:  Diagnosis Date  . Anxiety   . Arthritis   . GERD (gastroesophageal reflux disease)   . Hypertension   . S/P endoscopy    2008: noncritical Schatzki ring s/p dilation, normal esophagus and stomach, 2003: normal esophagus, s/p Maloney dilation, multiple antral erosions consistent with chronic gastritis, no H.pylori,     Past Surgical History:  Procedure Laterality Date  . CESAREAN SECTION    . CHOLECYSTECTOMY    . COLONOSCOPY  10/17/2010   Procedure: COLONOSCOPY;  Surgeon: Corbin Ade, MD;  [facility];  Service: Endoscopy;  Laterality: N/A;  9:30AM  . ESOPHAGOGASTRODUODENOSCOPY  10/17/2010    Procedure: ESOPHAGOGASTRODUODENOSCOPY (EGD);  Surgeon: Corbin Ade, MD;  [facility];  Service: Endoscopy;  Laterality: N/A;  . FOOT SURGERY     right  . HAND SURGERY     left  . KNEE ARTHROSCOPY WITH MEDIAL MENISECTOMY  01/07/2012   Procedure: KNEE ARTHROSCOPY WITH MEDIAL MENISECTOMY;  Surgeon: Darreld Mclean, MD;  Location: [facility];  Service: Orthopedics;  Laterality: Right;  . MALONEY DILATION  10/17/2010   Procedure: [name] DILATION;  Surgeon: Corbin Ade, MD;  [facility];  Service: Endoscopy;  Laterality: N/A;    Family History  Problem Relation Age of Onset  . Cancer Father     esophagus ca, living  . Colon cancer Paternal Uncle     in 21s, early 67s    Social History Social History  Substance Use Topics  . Smoking status: Never Smoker  . Smokeless tobacco: Never Used  . Alcohol use Yes     Comment: occasional    No Known Allergies  Current Outpatient Prescriptions  Medication Sig Dispense Refill  . ALPRAZolam (XANAX) 0.25 MG tablet Take 0.25 mg by mouth at bedtime as needed for anxiety.    . diclofenac (VOLTAREN) 75 MG EC tablet Take 1 tablet (75 mg total) by mouth 2 (two) times daily. 60 tablet 5  . hydrochlorothiazide (HYDRODIURIL) 25 MG tablet Take 25 mg by mouth daily.    Marland Kitchen HYDROcodone-acetaminophen (NORCO/VICODIN) 5-325  MG tablet Take 1 tablet by mouth every 4 (four) hours as needed for moderate pain (Must last 30 days.  Do not take and drive a car or use machinery.). 90 tablet 0  . Omega-3 Fatty Acids (FISH OIL) 1000 MG CAPS Take 1 capsule by mouth daily.    Marland Kitchen omeprazole (PRILOSEC) 20 MG capsule Take 20 mg by mouth daily.    . Pyridoxine HCl (VITAMIN B-6) 500 MG tablet Take 500 mg by mouth 2 (two) times daily. Reported on 04/04/2015    . vitamin B-12 (CYANOCOBALAMIN) 1000 MCG tablet Take 1,000 mcg by mouth daily. Reported on 04/04/2015    . zinc gluconate 50 MG tablet Take 50 mg by mouth daily. Reported on 04/04/2015     No current  facility-administered medications for this visit.      Physical Exam  Blood pressure (!) 153/84, pulse 61, temperature 97.9 F (36.6 C), height 5\' 1"  (1.549 m), weight 189 lb (85.7 kg).  Constitutional: overall normal hygiene, normal nutrition, well developed, normal grooming, normal body habitus. Assistive device:none  Musculoskeletal: gait and station Limp right, muscle tone and strength are normal, no tremors or atrophy is present.  .  Neurological: coordination overall normal.  Deep tendon reflex/nerve stretch intact.  Sensation normal.  Cranial nerves II-XII intact.   Skin:   Normal overall no scars, lesions, ulcers or rashes. No psoriasis.  Psychiatric: Alert and oriented x 3.  Recent memory intact, remote memory unclear.  Normal mood and affect. Well groomed.  Good eye contact.  Cardiovascular: overall no swelling, no varicosities, no edema bilaterally, normal temperatures of the legs and arms, no clubbing, cyanosis and good capillary refill.  Lymphatic: palpation is normal.  The right lower extremity is examined:  Inspection:  Thigh:  Non-tender and no defects  Knee has swelling 1+ effusion.                        Joint tenderness is present                        Patient is tender over the medial joint line  Lower Leg:  Has normal appearance and no tenderness or defects  Ankle:  Non-tender and no defects  Foot:  Non-tender and no defects Range of Motion:  Knee:  Range of motion is: 0-110                        Crepitus is  present  Ankle:  Range of motion is normal. Strength and Tone:  The right lower extremity has normal strength and tone. Stability:  Knee:  The knee is stable.  Ankle:  The ankle is stable.  She has locking of the right dominant long finger.  NV intact.  The patient has been educated about the nature of the problem(s) and counseled on treatment options.  The patient appeared to understand what I have discussed and is in agreement with  it.  Encounter Diagnoses  Name Primary?  . Trigger finger, acquired Yes  . Chronic pain of right knee    PROCEDURE  Trigger Finger Injection  The right Middle finger  has been locking at the A1 pulley.  The patient has been told about injection of the digit.  Surgical correction and excision of the A1 pulley will resolve the problem.  Ani injection in the digit should help but the results may be short lived.  The patient  asked appropriate questions and understands the procedure.  The patient has elected for an injection at this time.  Verbal consent was obtained.  A timeout was taken to confirm the proper hand and digit.  Medication  1 mL of 40 mg Depo-Medrol  2 mL of 1% lidocaine plain  Ethyl chloride for anesthesia  Alcohol was used to prepare the skin along with ethyl chloride and then the injection was made at the A1 pulley there were no complications.  It was tolerated well.  A Band-aid dressing was applied.  PLAN Call if any problems.  Precautions discussed.  Continue current medications.   Return to clinic to see Dr. Romeo AppleHarrison   I [place] site prior to prescribing narcotic medicine for this patient.  Electronically Signed Darreld McleanWayne Carma Dwiggins, MD 2/7/20189:54 AM

## 2016-04-02 NOTE — Telephone Encounter (Signed)
Feb 26th 

## 2016-04-02 NOTE — Telephone Encounter (Signed)
Called patient; left voice message to return call to schedule per Dr Harrison's response.

## 2016-04-06 DIAGNOSIS — J019 Acute sinusitis, unspecified: Secondary | ICD-10-CM | POA: Diagnosis not present

## 2016-04-06 DIAGNOSIS — I10 Essential (primary) hypertension: Secondary | ICD-10-CM | POA: Diagnosis not present

## 2016-04-06 DIAGNOSIS — J209 Acute bronchitis, unspecified: Secondary | ICD-10-CM | POA: Diagnosis not present

## 2016-04-07 NOTE — Telephone Encounter (Signed)
Called back to patient to follow up. Patient requests to wait until May to come in for appointment/evaluation for surgery by Dr Romeo AppleHarrison due to family-related things.  Scheduled accordingly.

## 2016-04-29 ENCOUNTER — Telehealth: Payer: Self-pay | Admitting: Radiology

## 2016-04-29 NOTE — Telephone Encounter (Signed)
Patient needs refill on Norco.

## 2016-05-04 MED ORDER — HYDROCODONE-ACETAMINOPHEN 5-325 MG PO TABS: 1.0000 | ORAL_TABLET | Freq: Four times a day (QID) | ORAL | 0 refills | Status: DC | PRN

## 2016-06-02 ENCOUNTER — Telehealth: Payer: Self-pay | Admitting: Orthopaedic Surgery

## 2016-06-02 NOTE — Telephone Encounter (Signed)
Hydrocodone-Acetaminophen  5/325mg  Qty 45 Tablets °

## 2016-06-03 MED ORDER — HYDROCODONE-ACETAMINOPHEN 5-325 MG PO TABS: 1.0000 | ORAL_TABLET | Freq: Four times a day (QID) | ORAL | 0 refills | Status: DC | PRN

## 2016-06-05 DIAGNOSIS — J301 Allergic rhinitis due to pollen: Secondary | ICD-10-CM | POA: Diagnosis not present

## 2016-06-05 DIAGNOSIS — E6609 Other obesity due to excess calories: Secondary | ICD-10-CM | POA: Diagnosis not present

## 2016-06-05 DIAGNOSIS — Z6836 Body mass index (BMI) 36.0-36.9, adult: Secondary | ICD-10-CM | POA: Diagnosis not present

## 2016-06-05 DIAGNOSIS — Z1389 Encounter for screening for other disorder: Secondary | ICD-10-CM | POA: Diagnosis not present

## 2016-07-03 ENCOUNTER — Telehealth: Payer: Self-pay | Admitting: Orthopaedic Surgery

## 2016-07-03 MED ORDER — HYDROCODONE-ACETAMINOPHEN 5-325 MG PO TABS: 1.0000 | ORAL_TABLET | Freq: Four times a day (QID) | ORAL | 0 refills | Status: DC | PRN

## 2016-07-03 NOTE — Telephone Encounter (Signed)
Hydrocodone-Acetaminophen  5/325mg  Qty 40 Tablets °

## 2016-07-06 ENCOUNTER — Ambulatory Visit: Payer: BLUE CROSS/BLUE SHIELD | Admitting: Orthopedic Surgery

## 2016-07-22 ENCOUNTER — Encounter: Payer: Self-pay | Admitting: Orthopedic Surgery

## 2016-07-22 ENCOUNTER — Ambulatory Visit (INDEPENDENT_AMBULATORY_CARE_PROVIDER_SITE_OTHER): Payer: BLUE CROSS/BLUE SHIELD | Admitting: Orthopedic Surgery

## 2016-07-22 VITALS — BP 127/76 | HR 76 | Wt 189.0 lb

## 2016-07-22 DIAGNOSIS — M6530 Trigger finger, unspecified finger: Secondary | ICD-10-CM

## 2016-07-22 NOTE — Progress Notes (Addendum)
  NEW PATIENT OFFICE VISIT    Chief Complaint  Patient presents with  . 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  Neurological: Negative for tingling, tremors and sensory change.  All other systems reviewed and are negative.    Past Medical History:  Diagnosis Date  . Anxiety   . Arthritis   . GERD (gastroesophageal reflux disease)   . Hypertension   . S/P endoscopy    2008: noncritical Schatzki ring s/p dilation, normal esophagus and stomach, 2003: normal esophagus, s/p Maloney dilation, multiple antral erosions consistent with chronic gastritis, no H.pylori,     Past Surgical History:  Procedure Laterality Date  . CESAREAN SECTION    . CHOLECYSTECTOMY    . COLONOSCOPY  10/17/2010   Procedure: COLONOSCOPY;  Surgeon: Corbin Adeobert M Rourk, MD;  Location: [facility];  Service: Endoscopy;  Laterality: N/A;  9:30AM  . ESOPHAGOGASTRODUODENOSCOPY  10/17/2010   Procedure: ESOPHAGOGASTRODUODENOSCOPY (EGD);  Surgeon: Corbin Adeobert M Rourk, MD;  Location: [facility];  Service: Endoscopy;  Laterality: N/A;  . FOOT SURGERY     right  . HAND SURGERY     left  . KNEE ARTHROSCOPY WITH MEDIAL MENISECTOMY  01/07/2012   Procedure: KNEE ARTHROSCOPY WITH MEDIAL MENISECTOMY;  Surgeon: Darreld McleanWayne Keeling, MD;  Location: [facility];  Service: Orthopedics;  Laterality: Right;  . MALONEY DILATION  10/17/2010   Procedure: Elease HashimotoMALONEY DILATION;  Surgeon: Corbin Adeobert M Rourk, MD;  Location: [facility];  Service: Endoscopy;  Laterality: N/A;    Family History  Problem Relation Age of Onset  . Cancer Father        esophagus ca, living  . Colon cancer  Paternal Uncle        in 8560s, early 7570s   Social History  Substance Use Topics  . Smoking status: Never Smoker  . Smokeless tobacco: Never Used  . Alcohol use Yes     Comment: occasional    BP 127/76   Pulse 76   Wt 189 lb (85.7 kg)   BMI 35.71 kg/m   Physical Exam  Constitutional: She is oriented to person, place, and time. She appears well-developed and well-nourished.  Neurological: She is alert and oriented to person, place, and time. Gait normal.  Psychiatric: She has a normal mood and affect.  Vitals reviewed.   Ortho Exam Right hand exam  On inspection we see no swelling. She has full range of motion. There is no instability. Motor exam of the flexor tendons normal and the neurovascular exam is normal with excellent capillary refill no sensory deficits skin normal no rash she does exhibit tenderness over the A1 pulley with catching and locking of the right long finger   On the left side we have A1 pulley tenderness at the long finger with otherwise normal range of motion and neurovascular exam intact  No orders of the defined types were placed in this encounter.   Encounter Diagnosis  Name Primary?  . Trigger finger, acquired Yes     PLAN:   RIGHT LONG FINGER TRIGGER RELEASE AT HER CONVENIENCE

## 2016-07-22 NOTE — Patient Instructions (Addendum)
Trigger Finger Trigger finger (stenosing tenosynovitis) is a condition that causes a finger to get stuck in a bent position. Each finger has a tough, cord-like tissue that connects muscle to bone (tendon), and each tendon is surrounded by a tunnel of tissue (tendon sheath). To move your finger, your tendon needs to slide freely through the sheath. Trigger finger happens when the tendon or the sheath thickens, making it difficult to move your finger. Trigger finger can affect any finger or a thumb. It may affect more than one finger. Mild cases may clear up with rest and medicine. Severe cases require more treatment. What are the causes? Trigger finger is caused by a thickened finger tendon or tendon sheath. The cause of this thickening is not known. What increases the risk? The following factors may make you more likely to develop this condition:  Doing activities that require a strong grip.  Having rheumatoid arthritis, gout, or diabetes.  Being 40-60 years old.  Being a woman.  What are the signs or symptoms? Symptoms of this condition include:  Pain when bending or straightening your finger.  Tenderness or swelling where your finger attaches to the palm of your hand.  A lump in the palm of your hand or on the inside of your finger.  Hearing a popping sound when you try to straighten your finger.  Feeling a popping, catching, or locking sensation when you try to straighten your finger.  Being unable to straighten your finger.  How is this diagnosed? This condition is diagnosed based on your symptoms and a physical exam. How is this treated? This condition may be treated by:  Resting your finger and avoiding activities that make symptoms worse.  Wearing a finger splint to keep your finger in a slightly bent position.  Taking NSAIDs to relieve pain and swelling.  Injecting medicine (steroids) into the tendon sheath to reduce swelling and irritation. Injections may need to be  repeated.  Having surgery to open the tendon sheath. This may be done if other treatments do not work and you cannot straighten your finger. You may need physical therapy after surgery.  Follow these instructions at home:  Use moist heat to help reduce pain and swelling as told by your health care provider.  Rest your finger and avoid activities that make pain worse. Return to normal activities as told by your health care provider.  If you have a splint, wear it as told by your health care provider.  Take over-the-counter and prescription medicines only as told by your health care provider.  Keep all follow-up visits as told by your health care provider. This is important. Contact a health care provider if:  Your symptoms are not improving with home care. Summary  Trigger finger (stenosing tenosynovitis) causes your finger to get stuck in a bent position, and it can make it difficult and painful to straighten your finger.  This condition develops when a finger tendon or tendon sheath thickens.  Treatment starts with resting, wearing a splint, and taking NSAIDs.  In severe cases, surgery to open the tendon sheath may be needed. This information is not intended to replace advice given to you by your health care provider. Make sure you discuss any questions you have with your health care provider. Document Released: 11/30/2003 Document Revised: 01/21/2016 Document Reviewed: 01/21/2016 Elsevier Interactive Patient Education  2017 [employer]vier Inc.  

## 2016-08-05 ENCOUNTER — Telehealth: Payer: Self-pay | Admitting: Orthopaedic Surgery

## 2016-08-05 MED ORDER — HYDROCODONE-ACETAMINOPHEN 5-325 MG PO TABS: 1.0000 | ORAL_TABLET | Freq: Four times a day (QID) | ORAL | 0 refills | Status: DC | PRN

## 2016-08-05 NOTE — Telephone Encounter (Signed)
Hydrocodone-Acetaminophen  5/325 mg  Qty 30 Tablets  Pt wants to pick up tomorrow and leave at pharmacy

## 2016-08-10 ENCOUNTER — Telehealth: Payer: Self-pay | Admitting: Orthopaedic Surgery

## 2016-08-10 MED ORDER — DICLOFENAC SODIUM 75 MG PO TBEC: 75.0000 mg | DELAYED_RELEASE_TABLET | Freq: Two times a day (BID) | ORAL | 5 refills | Status: DC

## 2016-08-10 NOTE — Telephone Encounter (Signed)
Patient called to request refill:  diclofenac (VOLTAREN) 75 MG EC tablet 60 tablet  - patient also relays she is still considering the surgery per evaluation by Dr Romeo AppleHarrison; will call back when ready.

## 2016-08-11 DIAGNOSIS — Z01419 Encounter for gynecological examination (general) (routine) without abnormal findings: Secondary | ICD-10-CM | POA: Diagnosis not present

## 2016-08-11 DIAGNOSIS — Z6835 Body mass index (BMI) 35.0-35.9, adult: Secondary | ICD-10-CM | POA: Diagnosis not present

## 2016-08-12 ENCOUNTER — Telehealth: Payer: Self-pay | Admitting: Orthopedic Surgery

## 2016-08-12 NOTE — Telephone Encounter (Signed)
Routing to DR. Harrison to advise 

## 2016-08-12 NOTE — Telephone Encounter (Signed)
Patient called to ask if she may possibly have surgery for trigger finger scheduled for next week. She relays that she's just been notified that her workplace will have shut-down for 2 weeks, beginning next week.  Please advise. Ph# to leave detailed message: [phone]

## 2016-08-12 NOTE — Telephone Encounter (Signed)
IF WE HAVE ROOM ON THE SCHEDULE AND IT CAN BE PRECERTED  [id number]

## 2016-08-14 ENCOUNTER — Telehealth: Payer: Self-pay | Admitting: *Deleted

## 2016-08-14 ENCOUNTER — Telehealth: Payer: Self-pay | Admitting: Orthopaedic Surgery

## 2016-08-14 NOTE — Addendum Note (Signed)
Addended by: Fuller CanadaHARRISON, STANLEY E on: 08/14/2016 10:05 AM   Modules accepted: Orders, SmartSet

## 2016-08-14 NOTE — Telephone Encounter (Signed)
No precert required for CPT 26055 per [place] with [employer]. Surgery scheduled for 08/20/16. Patient informed of pre- op time and date.

## 2016-08-14 NOTE — Telephone Encounter (Signed)
No precert required per her [employer]. Patient would like to do it on 08/20/16 if you can put surgery in. Thanks

## 2016-08-17 ENCOUNTER — Encounter: Payer: Self-pay | Admitting: Orthopedic Surgery

## 2016-08-17 NOTE — Patient Instructions (Signed)
Christina KrebsSusan J Spencer  08/17/2016     @PREFPERIOPPHARMACY @   Your procedure is scheduled on  08/20/2016 .  [facility] at  1100  A.M.  Call this number if you have problems the morning of surgery:  [phone]   Remember:  Do not eat food or drink liquids after midnight.  Take these medicines the morning of surgery with A SIP OF WATER  Hydrocodone, prilosec, ditropan.   Do not wear jewelry, make-up or nail polish.  Do not wear lotions, powders, or perfumes, or deoderant.  Do not shave 48 hours prior to surgery.  Men may shave face and neck.  Do not bring valuables to the hospital.  [facility] any belongings or valuables.  Contacts, dentures or bridgework may not be worn into surgery.  Leave your suitcase in the car.  After surgery it may be brought to your room.  For patients admitted to the hospital, discharge time will be determined by your treatment team.  Patients discharged the day of surgery will not be allowed to drive home.   Name and phone number of your driver:   family Special instructions:  None  Please read over the following fact sheets that you were given. Anesthesia Post-op Instructions and Care and Recovery After Surgery       Trigger Finger Trigger finger (stenosing tenosynovitis) is a condition that causes a finger to get stuck in a bent position. Each finger has a tough, cord-like tissue that connects muscle to bone (tendon), and each tendon is surrounded by a tunnel of tissue (tendon sheath). To move your finger, your tendon needs to slide freely through the sheath. Trigger finger happens when the tendon or the sheath thickens, making it difficult to move your finger. Trigger finger can affect any finger or a thumb. It may affect more than one finger. Mild cases may clear up with rest and medicine. Severe cases require more treatment. What are the causes? Trigger finger is caused by a thickened finger tendon or  tendon sheath. The cause of this thickening is not known. What increases the risk? The following factors may make you more likely to develop this condition:  Doing activities that require a strong grip.  Having rheumatoid arthritis, gout, or diabetes.  Being 6340-56 years old.  Being a woman.  What are the signs or symptoms? Symptoms of this condition include:  Pain when bending or straightening your finger.  Tenderness or swelling where your finger attaches to the palm of your hand.  A lump in the palm of your hand or on the inside of your finger.  Hearing a popping sound when you try to straighten your finger.  Feeling a popping, catching, or locking sensation when you try to straighten your finger.  Being unable to straighten your finger.  How is this diagnosed? This condition is diagnosed based on your symptoms and a physical exam. How is this treated? This condition may be treated by:  Resting your finger and avoiding activities that make symptoms worse.  Wearing a finger splint to keep your finger in a slightly bent position.  Taking NSAIDs to relieve pain and swelling.  Injecting medicine (steroids) into the tendon sheath to reduce swelling and irritation. Injections may need to be repeated.  Having surgery to open the tendon sheath. This may be done if other treatments do not work and you cannot straighten your finger. You may need physical therapy  after surgery.  Follow these instructions at home:  Use moist heat to help reduce pain and swelling as told by your health care provider.  Rest your finger and avoid activities that make pain worse. Return to normal activities as told by your health care provider.  If you have a splint, wear it as told by your health care provider.  Take over-the-counter and prescription medicines only as told by your health care provider.  Keep all follow-up visits as told by your health care provider. This is important. Contact  a health care provider if:  Your symptoms are not improving with home care. Summary  Trigger finger (stenosing tenosynovitis) causes your finger to get stuck in a bent position, and it can make it difficult and painful to straighten your finger.  This condition develops when a finger tendon or tendon sheath thickens.  Treatment starts with resting, wearing a splint, and taking NSAIDs.  In severe cases, surgery to open the tendon sheath may be needed. This information is not intended to replace advice given to you by your health care provider. Make sure you discuss any questions you have with your health care provider. Document Released: 11/30/2003 Document Revised: 01/21/2016 Document Reviewed: 01/21/2016 Elsevier Interactive Patient Education  2017 Elsevier Inc.  Monitored Anesthesia Care Anesthesia is a term that refers to techniques, procedures, and medicines that help a person stay safe and comfortable during a medical procedure. Monitored anesthesia care, or sedation, is one type of anesthesia. Your anesthesia specialist may recommend sedation if you will be having a procedure that does not require you to be unconscious, such as:  Cataract surgery.  A dental procedure.  A biopsy.  A colonoscopy.  During the procedure, you may receive a medicine to help you relax (sedative). There are three levels of sedation:  Mild sedation. At this level, you may feel awake and relaxed. You will be able to follow directions.  Moderate sedation. At this level, you will be sleepy. You may not remember the procedure.  Deep sedation. At this level, you will be asleep. You will not remember the procedure.  The more medicine you are given, the deeper your level of sedation will be. Depending on how you respond to the procedure, the anesthesia specialist may change your level of sedation or the type of anesthesia to fit your needs. An anesthesia specialist will monitor you closely during the  procedure. Let your health care provider know about:  Any allergies you have.  All medicines you are taking, including vitamins, herbs, eye drops, creams, and over-the-counter medicines.  Any use of steroids (by mouth or as a cream).  Any problems you or family members have had with sedatives and anesthetic medicines.  Any blood disorders you have.  Any surgeries you have had.  Any medical conditions you have, such as sleep apnea.  Whether you are pregnant or may be pregnant.  Any use of cigarettes, alcohol, or street drugs. What are the risks? Generally, this is a safe procedure. However, problems may occur, including:  Getting too much medicine (oversedation).  Nausea.  Allergic reaction to medicines.  Trouble breathing. If this happens, a breathing tube may be used to help with breathing. It will be removed when you are awake and breathing on your own.  Heart trouble.  Lung trouble.  Before the procedure Staying hydrated Follow instructions from your health care provider about hydration, which may include:  Up to 2 hours before the procedure - you may continue to drink  clear liquids, such as water, clear fruit juice, black coffee, and plain tea.  Eating and drinking restrictions Follow instructions from your health care provider about eating and drinking, which may include:  8 hours before the procedure - stop eating heavy meals or foods such as meat, fried foods, or fatty foods.  6 hours before the procedure - stop eating light meals or foods, such as toast or cereal.  6 hours before the procedure - stop drinking milk or drinks that contain milk.  2 hours before the procedure - stop drinking clear liquids.  Medicines Ask your health care provider about:  Changing or stopping your regular medicines. This is especially important if you are taking diabetes medicines or blood thinners.  Taking medicines such as aspirin and ibuprofen. These medicines can thin  your blood. Do not take these medicines before your procedure if your health care provider instructs you not to.  Tests and exams  You will have a physical exam.  You may have blood tests done to show: ? How well your kidneys and liver are working. ? How well your blood can clot.  General instructions  Plan to have someone take you home from the hospital or clinic.  If you will be going home right after the procedure, plan to have someone with you for 24 hours.  What happens during the procedure?  Your blood pressure, heart rate, breathing, level of pain and overall condition will be monitored.  An IV tube will be inserted into one of your veins.  Your anesthesia specialist will give you medicines as needed to keep you comfortable during the procedure. This may mean changing the level of sedation.  The procedure will be performed. After the procedure  Your blood pressure, heart rate, breathing rate, and blood oxygen level will be monitored until the medicines you were given have worn off.  Do not drive for 24 hours if you received a sedative.  You may: ? Feel sleepy, clumsy, or nauseous. ? Feel forgetful about what happened after the procedure. ? Have a sore throat if you had a breathing tube during the procedure. ? Vomit. This information is not intended to replace advice given to you by your health care provider. Make sure you discuss any questions you have with your health care provider. Document Released: 11/05/2004 Document Revised: 07/19/2015 Document Reviewed: 06/02/2015 Elsevier Interactive Patient Education  2018 Elsevier Inc. Monitored Anesthesia Care, Care After These instructions provide you with information about caring for yourself after your procedure. Your health care provider may also give you more specific instructions. Your treatment has been planned according to current medical practices, but problems sometimes occur. Call your health care provider if you  have any problems or questions after your procedure. What can I expect after the procedure? After your procedure, it is common to:  Feel sleepy for several hours.  Feel clumsy and have poor balance for several hours.  Feel forgetful about what happened after the procedure.  Have poor judgment for several hours.  Feel nauseous or vomit.  Have a sore throat if you had a breathing tube during the procedure.  Follow these instructions at home: For at least 24 hours after the procedure:   Do not: ? Participate in activities in which you could fall or become injured. ? Drive. ? Use heavy machinery. ? Drink alcohol. ? Take sleeping pills or medicines that cause drowsiness. ? Make important decisions or sign legal documents. ? Take care of children on your  own.  Rest. Eating and drinking  Follow the diet that is recommended by your health care provider.  If you vomit, drink water, juice, or soup when you can drink without vomiting.  Make sure you have little or no nausea before eating solid foods. General instructions  Have a responsible adult stay with you until you are awake and alert.  Take over-the-counter and prescription medicines only as told by your health care provider.  If you smoke, do not smoke without supervision.  Keep all follow-up visits as told by your health care provider. This is important. Contact a health care provider if:  You keep feeling nauseous or you keep vomiting.  You feel light-headed.  You develop a rash.  You have a fever. Get help right away if:  You have trouble breathing. This information is not intended to replace advice given to you by your health care provider. Make sure you discuss any questions you have with your health care provider. Document Released: 06/02/2015 Document Revised: 10/02/2015 Document Reviewed: 06/02/2015 Elsevier Interactive Patient Education  [employer].

## 2016-08-18 ENCOUNTER — Encounter (HOSPITAL_COMMUNITY)
Admission: RE | Admit: 2016-08-18 | Discharge: 2016-08-18 | Disposition: A | Discharge status: Home / Self Care | Payer: BLUE CROSS/BLUE SHIELD | Source: Ambulatory Visit | Attending: Orthopedic Surgery | Admitting: Orthopedic Surgery

## 2016-08-18 ENCOUNTER — Encounter (HOSPITAL_COMMUNITY): Payer: Self-pay

## 2016-08-18 DIAGNOSIS — M1990 Unspecified osteoarthritis, unspecified site: Secondary | ICD-10-CM | POA: Diagnosis not present

## 2016-08-18 DIAGNOSIS — M6530 Trigger finger, unspecified finger: Secondary | ICD-10-CM | POA: Diagnosis present

## 2016-08-18 DIAGNOSIS — Z800 Family history of malignant neoplasm of digestive organs: Secondary | ICD-10-CM | POA: Diagnosis not present

## 2016-08-18 DIAGNOSIS — I10 Essential (primary) hypertension: Secondary | ICD-10-CM | POA: Diagnosis not present

## 2016-08-18 DIAGNOSIS — K219 Gastro-esophageal reflux disease without esophagitis: Secondary | ICD-10-CM | POA: Diagnosis not present

## 2016-08-18 DIAGNOSIS — M65331 Trigger finger, right middle finger: Secondary | ICD-10-CM | POA: Diagnosis not present

## 2016-08-18 DIAGNOSIS — F419 Anxiety disorder, unspecified: Secondary | ICD-10-CM | POA: Diagnosis not present

## 2016-08-18 DIAGNOSIS — M791 Myalgia: Secondary | ICD-10-CM | POA: Diagnosis not present

## 2016-08-18 DIAGNOSIS — Z9049 Acquired absence of other specified parts of digestive tract: Secondary | ICD-10-CM | POA: Diagnosis not present

## 2016-08-18 LAB — CBC WITH DIFFERENTIAL/PLATELET
BASOS PCT: 0 %
Basophils Absolute: 0 10*3/uL (ref 0.0–0.1)
Eosinophils Absolute: 0.2 10*3/uL (ref 0.0–0.7)
Eosinophils Relative: 2 %
HEMATOCRIT: 39.2 % (ref 36.0–46.0)
HEMOGLOBIN: 13.4 g/dL (ref 12.0–15.0)
LYMPHS ABS: 2.6 10*3/uL (ref 0.7–4.0)
LYMPHS PCT: 28 %
MCH: 30 pg (ref 26.0–34.0)
MCHC: 34.2 g/dL (ref 30.0–36.0)
MCV: 87.9 fL (ref 78.0–100.0)
MONOS PCT: 5 %
Monocytes Absolute: 0.5 10*3/uL (ref 0.1–1.0)
NEUTROS ABS: 5.9 10*3/uL (ref 1.7–7.7)
Neutrophils Relative %: 64 %
Platelets: 296 10*3/uL (ref 150–400)
RBC: 4.46 MIL/uL (ref 3.87–5.11)
RDW: 12.5 % (ref 11.5–15.5)
WBC: 9.2 10*3/uL (ref 4.0–10.5)

## 2016-08-18 LAB — BASIC METABOLIC PANEL
Anion gap: 9 (ref 5–15)
BUN: 17 mg/dL (ref 6–20)
CHLORIDE: 104 mmol/L (ref 101–111)
CO2: 23 mmol/L (ref 22–32)
CREATININE: 0.52 mg/dL (ref 0.44–1.00)
Calcium: 9.1 mg/dL (ref 8.9–10.3)
GFR calc non Af Amer: >60 mL/min (ref >60)
GLUCOSE: 120 mg/dL — AB (ref 65–99)
Potassium: 3.6 mmol/L (ref 3.5–5.1)
Sodium: 136 mmol/L (ref 135–145)

## 2016-08-19 MED ORDER — CEFAZOLIN SODIUM-DEXTROSE 2-4 GM/100ML-% IV SOLN: 2.0000 g | INTRAVENOUS | Status: AC

## 2016-08-19 NOTE — H&P (Signed)
[name] is an 56 y.o. female.   Chief Complaint  Patient presents with  . 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  Neurological: Negative for tingling, tremors and sensory change.  All other systems reviewed and are negative.           Past Medical History:  Diagnosis Date  . Anxiety    . Arthritis    . GERD (gastroesophageal reflux disease)    . Hypertension    . S/P endoscopy      2008: noncritical Schatzki ring s/p dilation, normal esophagus and stomach, 2003: normal esophagus, s/p Maloney dilation, multiple antral erosions consistent with chronic gastritis, no H.pylori,       Past Surgical History:  Procedure Laterality Date  . CESAREAN SECTION      . CHOLECYSTECTOMY      . COLONOSCOPY   10/17/2010    Procedure: COLONOSCOPY;  Surgeon: Daneil Dolin, MD;  Location: [facility];  Service: Endoscopy;  Laterality: N/A;  9:30AM  . ESOPHAGOGASTRODUODENOSCOPY   10/17/2010    Procedure: ESOPHAGOGASTRODUODENOSCOPY (EGD);  Surgeon: Daneil Dolin, MD;  Location: [facility];  Service: Endoscopy;  Laterality: N/A;  . FOOT SURGERY        right  . HAND SURGERY        left  . KNEE ARTHROSCOPY WITH MEDIAL MENISECTOMY   01/07/2012    Procedure: KNEE ARTHROSCOPY WITH MEDIAL MENISECTOMY;  Surgeon: Sanjuana Kava, MD;  Location: [facility];  Service: Orthopedics;  Laterality: Right;  . MALONEY DILATION   10/17/2010    Procedure: Venia Minks DILATION;  Surgeon: Daneil Dolin, MD;  Location: [facility];  Service: Endoscopy;  Laterality: N/A;           Family History  Problem Relation Age of Onset  . Cancer  Father          esophagus ca, living  . Colon cancer Paternal Uncle          in 41s, early 3s           Social History   Substance Use Topics   . Smoking status: Never Smoker   . Smokeless tobacco: Never Used   . Alcohol use Yes        Comment: occasional       BP 127/76   Pulse 76   Wt 189 lb (85.7 kg)   BMI 35.71 kg/m    Physical Exam  Constitutional: She is oriented to person, place, and time. She appears well-developed and well-nourished.  Neurological: She is alert and oriented to person, place, and time. Gait normal.  Psychiatric: She has a normal mood and affect.  Vitals reviewed.     Ortho Exam Right hand exam   On inspection we see no swelling. She has full range of motion. There is no instability. Motor exam of the flexor tendons normal and the neurovascular exam is normal with excellent capillary refill no sensory deficits skin normal no rash she does exhibit tenderness over the A1 pulley with catching and locking of the right long finger    On the  left side we have A1 pulley tenderness at the long finger with otherwise normal range of motion and neurovascular exam intact   No orders of the defined types were placed in this encounter.         Encounter Diagnosis  Name Primary?  . Trigger finger, acquired Yes        PLAN:    RIGHT LONG FINGER TRIGGER RELEASE AT HER CONVENIENCE    Past Medical History:  Diagnosis Date  . Anxiety   . Arthritis   . GERD (gastroesophageal reflux disease)   . Hypertension   . S/P endoscopy    2008: noncritical Schatzki ring s/p dilation, normal esophagus and stomach, 2003: normal esophagus, s/p Maloney dilation, multiple antral erosions consistent with chronic gastritis, no H.pylori,     Past Surgical History:  Procedure Laterality Date  . CESAREAN SECTION    . CHOLECYSTECTOMY    . COLONOSCOPY  10/17/2010   Procedure: COLONOSCOPY;  Surgeon: Daneil Dolin, MD;  [facility];  Service: Endoscopy;   Laterality: N/A;  9:30AM  . ESOPHAGOGASTRODUODENOSCOPY  10/17/2010   Procedure: ESOPHAGOGASTRODUODENOSCOPY (EGD);  Surgeon: Daneil Dolin, MD;  [facility]: [facility];  Service: Endoscopy;  Laterality: N/A;  . FOOT SURGERY Right    heel spur  . HAND SURGERY Left    carpal tunnel  . KNEE ARTHROSCOPY WITH MEDIAL MENISECTOMY  01/07/2012   Procedure: KNEE ARTHROSCOPY WITH MEDIAL MENISECTOMY;  Surgeon: Sanjuana Kava, MD;  Location: [facility];  Service: Orthopedics;  Laterality: Right;  . MALONEY DILATION  10/17/2010   Procedure: Venia Minks DILATION;  Surgeon: Daneil Dolin, MD;  [facility]: [facility];  Service: Endoscopy;  Laterality: N/A;    Family History  Problem Relation Age of Onset  . Cancer Father        esophagus ca, living  . Colon cancer Paternal Uncle        in 37s, early 56s   Social History:  reports that she has never smoked. She has never used smokeless tobacco. She reports that she drinks alcohol. She reports that she does not use drugs.  Allergies: No Known Allergies  No prescriptions prior to admission.    Results for orders placed or performed during the hospital encounter of 08/18/16 (from the past 48 hour(s))  CBC with Differential/Platelet     Status: None   Collection Time: 08/18/16  2:50 PM  Result Value Ref Range   WBC 9.2 4.0 - 10.5 K/uL    Comment: WHITE COUNT CONFIRMED ON SMEAR   RBC 4.46 3.87 - 5.11 MIL/uL   Hemoglobin 13.4 12.0 - 15.0 g/dL   HCT 39.2 36.0 - 46.0 %   MCV 87.9 78.0 - 100.0 fL   MCH 30.0 26.0 - 34.0 pg   MCHC 34.2 30.0 - 36.0 g/dL   RDW 12.5 11.5 - 15.5 %   Platelets 296 150 - 400 K/uL    Comment: SPECIMEN CHECKED FOR CLOTS PLATELET COUNT CONFIRMED BY SMEAR    Neutrophils Relative % 64 %   Neutro Abs 5.9 1.7 - 7.7 K/uL   Lymphocytes Relative 28 %   Lymphs Abs 2.6 0.7 - 4.0 K/uL   Monocytes Relative 5 %   Monocytes Absolute 0.5 0.1 - 1.0 K/uL   Eosinophils Relative 2 %   Eosinophils Absolute 0.2 0.0 - 0.7 K/uL   Basophils Relative  0 %   Basophils Absolute 0.0 0.0 - 0.1 K/uL  Basic metabolic panel     Status: Abnormal  Collection Time: 08/18/16  2:50 PM  Result Value Ref Range   Sodium 136 135 - 145 mmol/L   Potassium 3.6 3.5 - 5.1 mmol/L   Chloride 104 101 - 111 mmol/L   CO2 23 22 - 32 mmol/L   Glucose, Bld 120 (H) 65 - 99 mg/dL   BUN 17 6 - 20 mg/dL   Creatinine, Ser 0.52 0.44 - 1.00 mg/dL   Calcium 9.1 8.9 - 10.3 mg/dL   GFR calc non Af Amer >60 >60 mL/min   GFR calc Af Amer >60 >60 mL/min    Comment: (NOTE) The eGFR has been calculated using the CKD EPI equation. This calculation has not been validated in all clinical situations. eGFR's persistently <60 mL/min signify possible Chronic Kidney Disease.    Anion gap 9 5 - 15     ROS   Physical Exam    Arther Abbott, MD 08/19/2016, 5:24 PM

## 2016-08-20 ENCOUNTER — Ambulatory Visit (HOSPITAL_COMMUNITY): Payer: BLUE CROSS/BLUE SHIELD | Admitting: Anesthesiology

## 2016-08-20 ENCOUNTER — Encounter (HOSPITAL_COMMUNITY): Payer: Self-pay | Admitting: *Deleted

## 2016-08-20 ENCOUNTER — Ambulatory Visit (HOSPITAL_COMMUNITY)
Admission: RE | Admit: 2016-08-20 | Discharge: 2016-08-20 | Disposition: A | Discharge status: Home / Self Care | Payer: BLUE CROSS/BLUE SHIELD | Source: Ambulatory Visit | Attending: Orthopedic Surgery | Admitting: Orthopedic Surgery

## 2016-08-20 ENCOUNTER — Encounter (HOSPITAL_COMMUNITY)
Admission: RE | Disposition: A | Discharge status: Home / Self Care | Payer: Self-pay | Source: Ambulatory Visit | Attending: Orthopedic Surgery

## 2016-08-20 DIAGNOSIS — Z800 Family history of malignant neoplasm of digestive organs: Secondary | ICD-10-CM | POA: Insufficient documentation

## 2016-08-20 DIAGNOSIS — M65331 Trigger finger, right middle finger: Secondary | ICD-10-CM

## 2016-08-20 DIAGNOSIS — Z9049 Acquired absence of other specified parts of digestive tract: Secondary | ICD-10-CM | POA: Insufficient documentation

## 2016-08-20 DIAGNOSIS — M65341 Trigger finger, right ring finger: Secondary | ICD-10-CM | POA: Diagnosis not present

## 2016-08-20 DIAGNOSIS — K219 Gastro-esophageal reflux disease without esophagitis: Secondary | ICD-10-CM | POA: Insufficient documentation

## 2016-08-20 DIAGNOSIS — I10 Essential (primary) hypertension: Secondary | ICD-10-CM | POA: Insufficient documentation

## 2016-08-20 DIAGNOSIS — M791 Myalgia: Secondary | ICD-10-CM | POA: Insufficient documentation

## 2016-08-20 DIAGNOSIS — F419 Anxiety disorder, unspecified: Secondary | ICD-10-CM | POA: Insufficient documentation

## 2016-08-20 DIAGNOSIS — M1990 Unspecified osteoarthritis, unspecified site: Secondary | ICD-10-CM | POA: Insufficient documentation

## 2016-08-20 HISTORY — PX: TRIGGER FINGER RELEASE: SHX641

## 2016-08-20 SURGERY — RELEASE, A1 PULLEY, FOR TRIGGER FINGER
Anesthesia: Regional | Laterality: Right

## 2016-08-20 MED ORDER — MIDAZOLAM HCL 2 MG/2ML IJ SOLN
INTRAMUSCULAR | Status: AC
  Filled 2016-08-20: qty 2

## 2016-08-20 MED ORDER — FENTANYL CITRATE (PF) 100 MCG/2ML IJ SOLN
INTRAMUSCULAR | Status: DC | PRN
  Administered 2016-08-20 (×2): 25 ug via INTRAVENOUS

## 2016-08-20 MED ORDER — CEFAZOLIN SODIUM-DEXTROSE 2-3 GM-% IV SOLR
INTRAVENOUS | Status: DC | PRN
  Administered 2016-08-20: 2 g via INTRAVENOUS

## 2016-08-20 MED ORDER — CHLORHEXIDINE GLUCONATE 4 % EX LIQD: 60.0000 mL | Freq: Once | CUTANEOUS | Status: DC

## 2016-08-20 MED ORDER — FENTANYL CITRATE (PF) 100 MCG/2ML IJ SOLN
INTRAMUSCULAR | Status: AC
  Filled 2016-08-20: qty 2

## 2016-08-20 MED ORDER — BUPIVACAINE HCL (PF) 0.5 % IJ SOLN
INTRAMUSCULAR | Status: AC
  Filled 2016-08-20: qty 30

## 2016-08-20 MED ORDER — SODIUM CHLORIDE 0.9% FLUSH
INTRAVENOUS | Status: AC
  Filled 2016-08-20: qty 10

## 2016-08-20 MED ORDER — LACTATED RINGERS IV SOLN
INTRAVENOUS | Status: DC
  Administered 2016-08-20: 1000 mL via INTRAVENOUS

## 2016-08-20 MED ORDER — SODIUM CHLORIDE 0.9 % IR SOLN
Status: DC | PRN
Start: 2016-08-20 — End: 2016-08-20
  Administered 2016-08-20: 1000 mL

## 2016-08-20 MED ORDER — PROPOFOL 500 MG/50ML IV EMUL
INTRAVENOUS | Status: DC | PRN
  Administered 2016-08-20: 45 ug/kg/min via INTRAVENOUS

## 2016-08-20 MED ORDER — BUPIVACAINE HCL (PF) 0.5 % IJ SOLN
INTRAMUSCULAR | Status: DC | PRN
  Administered 2016-08-20: 10 mL

## 2016-08-20 MED ORDER — HYDROCODONE-ACETAMINOPHEN 5-325 MG PO TABS: 1.0000 | ORAL_TABLET | Freq: Four times a day (QID) | ORAL | 0 refills | Status: DC | PRN

## 2016-08-20 MED ORDER — LIDOCAINE HCL (PF) 0.5 % IJ SOLN
INTRAMUSCULAR | Status: DC | PRN
  Administered 2016-08-20: 50 mL via INTRAVENOUS

## 2016-08-20 MED ORDER — PROPOFOL 10 MG/ML IV BOLUS
INTRAVENOUS | Status: AC
  Filled 2016-08-20: qty 20

## 2016-08-20 MED ORDER — MIDAZOLAM HCL 5 MG/5ML IJ SOLN
INTRAMUSCULAR | Status: DC | PRN
  Administered 2016-08-20 (×2): 1 mg via INTRAVENOUS

## 2016-08-20 MED ORDER — LACTATED RINGERS IV SOLN: INTRAVENOUS | Status: DC

## 2016-08-20 MED ORDER — CEFAZOLIN SODIUM-DEXTROSE 2-4 GM/100ML-% IV SOLN
INTRAVENOUS | Status: AC
  Filled 2016-08-20: qty 100

## 2016-08-20 MED ORDER — MIDAZOLAM HCL 2 MG/2ML IJ SOLN
1.0000 mg | Freq: Once | INTRAMUSCULAR | Status: AC | PRN
  Administered 2016-08-20: 2 mg via INTRAVENOUS

## 2016-08-20 MED ORDER — PROPOFOL 10 MG/ML IV BOLUS
INTRAVENOUS | Status: DC | PRN
  Administered 2016-08-20 (×2): 10 mg via INTRAVENOUS

## 2016-08-20 SURGICAL SUPPLY — 43 items
BAG HAMPER (MISCELLANEOUS) ×2 IMPLANT
BANDAGE ELASTIC 2 LF NS (GAUZE/BANDAGES/DRESSINGS) ×2 IMPLANT
BANDAGE ESMARK 4X12 BL STRL LF (DISPOSABLE) IMPLANT
BLADE SURG 15 STRL LF DISP TIS (BLADE) ×1 IMPLANT
BLADE SURG 15 STRL SS (BLADE) ×2
BNDG CMPR 12X4 ELC STRL LF (DISPOSABLE) ×1
BNDG CMPR MED 5X2 ELC HKLP NS (GAUZE/BANDAGES/DRESSINGS) ×1
BNDG CONFORM 2 STRL LF (GAUZE/BANDAGES/DRESSINGS) ×1 IMPLANT
BNDG ESMARK 4X12 BLUE STRL LF (DISPOSABLE) ×2
CHLORAPREP W/TINT 26ML (MISCELLANEOUS) ×2 IMPLANT
CLOTH BEACON ORANGE TIMEOUT ST (SAFETY) ×2 IMPLANT
COVER LIGHT HANDLE STERIS (MISCELLANEOUS) ×4 IMPLANT
CUFF TOURNIQUET SINGLE 18IN (TOURNIQUET CUFF) ×2 IMPLANT
DECANTER SPIKE VIAL GLASS SM (MISCELLANEOUS) ×2 IMPLANT
DRAPE HALF SHEET 40X57 (DRAPES) ×2 IMPLANT
DRSG XEROFORM 1X8 (GAUZE/BANDAGES/DRESSINGS) ×1 IMPLANT
ELECT NDL TIP 2.8 STRL (NEEDLE) ×1 IMPLANT
ELECT NEEDLE TIP 2.8 STRL (NEEDLE) ×2 IMPLANT
ELECT REM PT RETURN 9FT ADLT (ELECTROSURGICAL) ×2
ELECTRODE REM PT RTRN 9FT ADLT (ELECTROSURGICAL) ×1 IMPLANT
GAUZE SPONGE 4X4 12PLY STRL (GAUZE/BANDAGES/DRESSINGS) ×2 IMPLANT
GLOVE BIOGEL PI IND STRL 6.5 (GLOVE) IMPLANT
GLOVE BIOGEL PI IND STRL 7.0 (GLOVE) ×1 IMPLANT
GLOVE BIOGEL PI INDICATOR 6.5 (GLOVE) ×1
GLOVE BIOGEL PI INDICATOR 7.0 (GLOVE) ×1
GLOVE SKINSENSE NS SZ8.0 LF (GLOVE) ×1
GLOVE SKINSENSE STRL SZ8.0 LF (GLOVE) ×1 IMPLANT
GLOVE SS N UNI LF 8.5 STRL (GLOVE) ×2 IMPLANT
GLOVE SURG SS PI 6.5 STRL IVOR (GLOVE) ×1 IMPLANT
GOWN STRL REUS W/TWL LRG LVL3 (GOWN DISPOSABLE) ×2 IMPLANT
GOWN STRL REUS W/TWL XL LVL3 (GOWN DISPOSABLE) ×2 IMPLANT
HAND ALUMI XLG (SOFTGOODS) ×2 IMPLANT
KIT ROOM TURNOVER APOR (KITS) ×2 IMPLANT
MANIFOLD NEPTUNE II (INSTRUMENTS) ×2 IMPLANT
NDL HYPO 21X1.5 SAFETY (NEEDLE) IMPLANT
NEEDLE HYPO 21X1.5 SAFETY (NEEDLE) ×2 IMPLANT
NS IRRIG 1000ML POUR BTL (IV SOLUTION) ×2 IMPLANT
PACK BASIC LIMB (CUSTOM PROCEDURE TRAY) ×2 IMPLANT
PAD ARMBOARD 7.5X6 YLW CONV (MISCELLANEOUS) ×2 IMPLANT
SET BASIN LINEN APH (SET/KITS/TRAYS/PACK) ×2 IMPLANT
SPONGE GAUZE 2X2 8PLY STRL LF (GAUZE/BANDAGES/DRESSINGS) ×1 IMPLANT
SUT ETHILON 3 0 FSL (SUTURE) ×2 IMPLANT
SYR CONTROL 10ML LL (SYRINGE) ×1 IMPLANT

## 2016-08-20 NOTE — Interval H&P Note (Signed)
History and Physical Interval Note:  08/20/2016 10:44 AM  [name]  has presented today for surgery, with the diagnosis of triggering right long finger  The various methods of treatment have been discussed with the patient and family. After consideration of risks, benefits and other options for treatment, the patient has consented to  Procedure(s): RELEASE TRIGGER FINGER/A-1 PULLEY, right long finger/middle (Right) as a surgical intervention .  The patient's history has been reviewed, patient examined, no change in status, stable for surgery.  I have reviewed the patient's chart and labs.  Questions were answered to the patient's satisfaction.     Fuller CanadaStanley Deairra Halleck

## 2016-08-20 NOTE — Brief Op Note (Signed)
08/20/2016  11:44 AM  PATIENT:  [name]  56 y.o. female  PRE-OPERATIVE DIAGNOSIS:  triggering right long finger  POST-OPERATIVE DIAGNOSIS:  triggering right long finger  PROCEDURE:  Procedure(s): RELEASE A-1 PULLEY RIGHT LONG FINGER (Right)-26055  SURGEON:  Surgeon(s) and Role:    * Vickki HearingHarrison, Stanley E, MD - Primary  PHYSICIAN ASSISTANT:   ASSISTANTS: none   ANESTHESIA:   regional  EBL:  Total I/O In: 500 [I.V.:500] Out: 0   BLOOD ADMINISTERED:none  DRAINS: none   LOCAL MEDICATIONS USED:  MARCAINE     SPECIMEN:  No Specimen  DISPOSITION OF SPECIMEN:  N/A  COUNTS:  YES  TOURNIQUET:  * Missing tourniquet times found for documented tourniquets in log:  [id number] *  DICTATION: .Dragon Dictation  PLAN OF CARE: Discharge to home after PACU  PATIENT DISPOSITION:  PACU - hemodynamically stable.   Delay start of Pharmacological VTE agent (>24hrs) due to surgical blood loss or risk of bleeding: not applicable  [id number]

## 2016-08-20 NOTE — Transfer of Care (Signed)
Immediate Anesthesia Transfer of Care Note  Patient: [name]  Procedure(s) Performed: Procedure(s): RELEASE A-1 PULLEY RIGHT LONG FINGER (Right)  Patient Location: PACU  Anesthesia Type:Bier block  Level of Consciousness: awake, alert  and oriented  Airway & Oxygen Therapy: Patient Spontanous Breathing and Patient connected to nasal cannula oxygen  Post-op Assessment: Report given to RN  Post vital signs: Reviewed and stable  Last Vitals:  Vitals:   08/20/16 1045 08/20/16 1100  BP: 131/80 124/74  Resp: (!) 22 (!) 21  Temp:      Last Pain:  Vitals:   08/20/16 1039  TempSrc: Oral      Patients Stated Pain Goal: 6 (08/20/16 1039)  Complications: No apparent anesthesia complications

## 2016-08-20 NOTE — Anesthesia Postprocedure Evaluation (Signed)
Anesthesia Post Note  Patient: [name]  Procedure(s) Performed: Procedure(s) (LRB): RELEASE A-1 PULLEY RIGHT LONG FINGER (Right)  Patient location during evaluation: PACU Anesthesia Type: Bier Block Level of consciousness: awake and alert and oriented Pain management: pain level controlled Vital Signs Assessment: post-procedure vital signs reviewed and stable Respiratory status: spontaneous breathing Cardiovascular status: blood pressure returned to baseline Postop Assessment: no signs of nausea or vomiting Anesthetic complications: no     Last Vitals:  Vitals:   08/20/16 1200 08/20/16 1215  BP: (!) 132/95 123/68  Pulse: 62 (!) 56  Resp: 20 17  Temp:      Last Pain:  Vitals:   08/20/16 1039  TempSrc: Oral                 Kattaleya Alia

## 2016-08-20 NOTE — Anesthesia Procedure Notes (Signed)
Anesthesia Regional Block: Bier block (IV Regional)   Pre-Anesthetic Checklist: ,, timeout performed, Correct Patient, Correct Site, Correct Laterality, Correct Procedure,, site marked, surgical consent,, at surgeon's request  Laterality: Right     Needles:  Injection technique: Single-shot  Needle Type: Other      Needle Gauge: 22     Additional Needles:   Procedures:,,,,,,, Esmarch exsanguination, single tourniquet utilized,   Nerve Stimulator or Paresthesia:   Additional Responses:  Pulse checked post tourniquet inflation. IV NSL discontinued post injection. Narrative:   Performed by: Personally       

## 2016-08-20 NOTE — Op Note (Signed)
OPERATIVE REPORT   08/20/2016 11:46 AM Fuller CanadaStanley Harrison, MD  [phone]  Preop diagnosis trigger finger right long/middle finger Postop diagnosis same  Procedure release A1 pulley right long middle finger Surgeon Romeo AppleHarrison Anesthesia Bier block Operation findings: Stenosing tenosynovitis flexor tendon A1 pulley No assistants Counts were correct Clean case no specimen 10 mL of Marcaine with epinephrine injected after the case Patient to recovery room patient's stable condition  The procedure was performed as follows  The patient was identified in the preop area and the surgical site was confirmed and marked, chart update was completed patient taken to surgery given appropriate antibiotics based on her allergy profile  After successful Bier block in sterile prep and drape timeout was completed  A transverse incision was made over the A1 pulley of the right long finger finger, subcutaneous tissue was divided blunt dissection was carried out to protect neurovascular structures. A blunt instrument was placed underneath the A1 pulley and the A1 pulley was released. Flexion extension of the digit confirmed removal of mechanical block. Wound was irrigated and closed with 3-0 nylon suture  We took the patient to recovery room in stable condition  Fuller CanadaStanley Harrison, MD

## 2016-08-20 NOTE — Anesthesia Preprocedure Evaluation (Signed)
Anesthesia Evaluation  Patient identified by MRN, date of birth, ID band Patient awake    Airway Mallampati: I  TM Distance: >3 FB Neck ROM: Full    Dental  (+) Teeth Intact   Pulmonary neg pulmonary ROS,    Pulmonary exam normal breath sounds clear to auscultation       Cardiovascular hypertension,  Rhythm:Regular Rate:Normal     Neuro/Psych negative neurological ROS     GI/Hepatic Neg liver ROS, GERD  ,  Endo/Other  negative endocrine ROS  Renal/GU negative Renal ROS     Musculoskeletal  (+) Arthritis ,   Abdominal Normal abdominal exam  (+)   Peds  Hematology negative hematology ROS (+)   Anesthesia Other Findings   Reproductive/Obstetrics                             Anesthesia Physical Anesthesia Plan  ASA: II  Anesthesia Plan: Bier Block   Post-op Pain Management:    Induction:   PONV Risk Score and Plan:   Airway Management Planned: Nasal Cannula  Additional Equipment:   Intra-op Plan:   Post-operative Plan:   Informed Consent: I have reviewed the patients History and Physical, chart, labs and discussed the procedure including the risks, benefits and alternatives for the proposed anesthesia with the patient or authorized representative who has indicated his/her understanding and acceptance.     Plan Discussed with: CRNA  Anesthesia Plan Comments:         Anesthesia Quick Evaluation

## 2016-08-21 ENCOUNTER — Encounter (HOSPITAL_COMMUNITY): Payer: Self-pay | Admitting: Orthopedic Surgery

## 2016-08-25 ENCOUNTER — Encounter: Payer: Self-pay | Admitting: Orthopedic Surgery

## 2016-08-25 ENCOUNTER — Ambulatory Visit (INDEPENDENT_AMBULATORY_CARE_PROVIDER_SITE_OTHER): Payer: BLUE CROSS/BLUE SHIELD | Admitting: Orthopedic Surgery

## 2016-08-25 DIAGNOSIS — Z4889 Encounter for other specified surgical aftercare: Secondary | ICD-10-CM

## 2016-08-25 DIAGNOSIS — M6530 Trigger finger, unspecified finger: Secondary | ICD-10-CM

## 2016-08-25 NOTE — Progress Notes (Signed)
Postop visit 1 status post trigger finger release  Chief Complaint  Patient presents with  . Follow-up    POST OP 1, RT LONG TRIGGER FINGER RELEASE, DOS 08/20/16    Her wound looks good her finger motion is very good the swelling is gone down she'll come back in a week for suture removal by the nurse.

## 2016-09-02 ENCOUNTER — Telehealth: Payer: Self-pay | Admitting: Orthopedic Surgery

## 2016-09-02 ENCOUNTER — Telehealth: Payer: Self-pay | Admitting: *Deleted

## 2016-09-02 ENCOUNTER — Ambulatory Visit (INDEPENDENT_AMBULATORY_CARE_PROVIDER_SITE_OTHER): Payer: BLUE CROSS/BLUE SHIELD | Admitting: Orthopedic Surgery

## 2016-09-02 DIAGNOSIS — Z4889 Encounter for other specified surgical aftercare: Secondary | ICD-10-CM

## 2016-09-02 MED ORDER — HYDROCODONE-ACETAMINOPHEN 5-325 MG PO TABS: 1.0000 | ORAL_TABLET | Freq: Four times a day (QID) | ORAL | 0 refills | Status: DC | PRN

## 2016-09-02 NOTE — Progress Notes (Signed)
Patient is in today for suture removal from right hand s/p Right long trigger finger release 08/20/16. Wound looks good, clean and dry. No signs of infection. Wound cleaned with alcohol and sutures removed. Patient tolerated well. To make follow up appointment for 4 weeks with Dr Romeo AppleHarrison.

## 2016-09-02 NOTE — Telephone Encounter (Signed)
Hydrocodone-Acetaminophen  5/325mg °

## 2016-09-02 NOTE — Telephone Encounter (Signed)
Patient in today s/p Right long trigger finger release 08/20/16 Requesting to go back to work next week 09/07/16. Is this okay?

## 2016-09-07 ENCOUNTER — Telehealth: Payer: Self-pay | Admitting: Orthopedic Surgery

## 2016-09-07 ENCOUNTER — Encounter: Payer: Self-pay | Admitting: Orthopedic Surgery

## 2016-09-07 NOTE — Telephone Encounter (Signed)
Spoke with patient. Note issued accordingly.  Patient aware.

## 2016-09-07 NOTE — Telephone Encounter (Signed)
PLEASE ADVISE AND PROVIDE WORK NOTE

## 2016-09-07 NOTE — Telephone Encounter (Signed)
yes

## 2016-09-08 ENCOUNTER — Encounter: Payer: Self-pay | Admitting: Orthopedic Surgery

## 2016-09-08 NOTE — Telephone Encounter (Signed)
Patient has an updated request for a note for extended days out, through this coming weekend.  Please advise.

## 2016-09-29 ENCOUNTER — Ambulatory Visit: Payer: BLUE CROSS/BLUE SHIELD | Admitting: Orthopedic Surgery

## 2016-10-05 ENCOUNTER — Encounter: Payer: Self-pay | Admitting: Orthopedic Surgery

## 2016-10-05 ENCOUNTER — Ambulatory Visit (INDEPENDENT_AMBULATORY_CARE_PROVIDER_SITE_OTHER): Payer: BLUE CROSS/BLUE SHIELD | Admitting: Orthopedic Surgery

## 2016-10-05 DIAGNOSIS — Z4889 Encounter for other specified surgical aftercare: Secondary | ICD-10-CM

## 2016-10-05 NOTE — Progress Notes (Signed)
Routine postop visit status post right long finger trigger finger release on 08/20/2016  The patient's only complaint is that her palm is a little sore. All triggering has been released and relieved  Her wound is normal  Encounter Diagnosis  Name Primary?  Marland Kitchen. Aftercare following surgery Yes    We released her today

## 2017-04-19 DIAGNOSIS — J209 Acute bronchitis, unspecified: Secondary | ICD-10-CM | POA: Diagnosis not present

## 2017-04-19 DIAGNOSIS — J019 Acute sinusitis, unspecified: Secondary | ICD-10-CM | POA: Diagnosis not present

## 2017-04-19 DIAGNOSIS — J029 Acute pharyngitis, unspecified: Secondary | ICD-10-CM | POA: Diagnosis not present

## 2017-09-15 DIAGNOSIS — H6123 Impacted cerumen, bilateral: Secondary | ICD-10-CM | POA: Diagnosis not present

## 2017-09-15 DIAGNOSIS — H9201 Otalgia, right ear: Secondary | ICD-10-CM | POA: Diagnosis not present

## 2017-09-15 DIAGNOSIS — J019 Acute sinusitis, unspecified: Secondary | ICD-10-CM | POA: Diagnosis not present

## 2017-09-29 ENCOUNTER — Ambulatory Visit: Payer: BLUE CROSS/BLUE SHIELD | Admitting: Orthopaedic Surgery

## 2017-09-29 ENCOUNTER — Encounter: Payer: Self-pay | Admitting: Orthopaedic Surgery

## 2017-09-29 VITALS — BP 152/87 | HR 78 | Ht 61.0 in | Wt 180.0 lb

## 2017-09-29 DIAGNOSIS — M25561 Pain in right knee: Secondary | ICD-10-CM

## 2017-09-29 DIAGNOSIS — G8929 Other chronic pain: Secondary | ICD-10-CM

## 2017-09-29 DIAGNOSIS — Z6834 Body mass index (BMI) 34.0-34.9, adult: Secondary | ICD-10-CM | POA: Diagnosis not present

## 2017-09-29 DIAGNOSIS — N898 Other specified noninflammatory disorders of vagina: Secondary | ICD-10-CM | POA: Diagnosis not present

## 2017-09-29 MED ORDER — DICLOFENAC SODIUM 75 MG PO TBEC: 75.0000 mg | DELAYED_RELEASE_TABLET | Freq: Two times a day (BID) | ORAL | 5 refills | Status: DC

## 2017-09-29 NOTE — Progress Notes (Signed)
Patient [name], female DOB:Nov 24, 1960, 57 y.o. QMV:[id number]  Chief Complaint  Patient presents with  . Knee Pain    right     HPI  [name] is a 57 y.o. female who has right knee pain.  She slipped about a month ago and hurt her right knee again.  She has had more pain posteriorly of the medial hamstring area.  She has had more swelling of the knee and some giving way.  She has been considering the possibility of a total knee. She had stopped her diclofenac but realized it did help and has resumed it.  Her knee is somewhat better now.  I have recommended BioFreeze or Aspercreme to the area and of course continue the diclofenac.   Body mass index is 34.01 kg/m.  ROS  Review of Systems  HENT: Negative for congestion.   Respiratory: Negative for cough and shortness of breath.   Cardiovascular: Negative for chest pain and leg swelling.       Hypertension   Gastrointestinal:       GERD and reflux  Endocrine: Positive for cold intolerance.  Musculoskeletal: Positive for arthralgias and joint swelling (right knee).  Allergic/Immunologic: Positive for environmental allergies.  Psychiatric/Behavioral: The patient is nervous/anxious.   All other systems reviewed and are negative.   All other systems reviewed and are negative.  Past Medical History:  Diagnosis Date  . Anxiety   . Arthritis   . GERD (gastroesophageal reflux disease)   . Hypertension   . S/P endoscopy    2008: noncritical Schatzki ring s/p dilation, normal esophagus and stomach, 2003: normal esophagus, s/p Maloney dilation, multiple antral erosions consistent with chronic gastritis, no H.pylori,     Past Surgical History:  Procedure Laterality Date  . CESAREAN SECTION    . CHOLECYSTECTOMY    . COLONOSCOPY  10/17/2010   Procedure: COLONOSCOPY;  Surgeon: Corbin Ade, MD;  [facility];  Service: Endoscopy;  Laterality: N/A;  9:30AM  . ESOPHAGOGASTRODUODENOSCOPY  10/17/2010    Procedure: ESOPHAGOGASTRODUODENOSCOPY (EGD);  Surgeon: Corbin Ade, MD;  [facility];  Service: Endoscopy;  Laterality: N/A;  . FOOT SURGERY Right    heel spur  . HAND SURGERY Left    carpal tunnel  . KNEE ARTHROSCOPY WITH MEDIAL MENISECTOMY  01/07/2012   Procedure: KNEE ARTHROSCOPY WITH MEDIAL MENISECTOMY;  Surgeon: Darreld Mclean, MD;  Location: [facility];  Service: Orthopedics;  Laterality: Right;  . MALONEY DILATION  10/17/2010   Procedure: [name] DILATION;  Surgeon: Corbin Ade, MD;  [facility];  Service: Endoscopy;  Laterality: N/A;  . TRIGGER FINGER RELEASE Right 08/20/2016   Procedure: RELEASE A-1 PULLEY RIGHT LONG FINGER;  Surgeon: Vickki Hearing, MD;  Location: [facility];  Service: Orthopedics;  Laterality: Right;    Family History  Problem Relation Age of Onset  . Cancer Father        esophagus ca, living  . Colon cancer Paternal Uncle        in 66s, early 41s    Social History Social History   Tobacco Use  . Smoking status: Never Smoker  . Smokeless tobacco: Never Used  Substance Use Topics  . Alcohol use: Yes    Comment: occasional  . Drug use: No    No Known Allergies  Current Outpatient Medications  Medication Sig Dispense Refill  . diclofenac (VOLTAREN) 75 MG EC tablet Take 1 tablet (75 mg total) by mouth 2 (two) times daily.  60 tablet 5  . hydrochlorothiazide (HYDRODIURIL) 25 MG tablet Take 25 mg by mouth daily.    . Omega-3 Fatty Acids (FISH OIL) 1000 MG CAPS Take 1-2 capsules by mouth See admin instructions. 2 capsules in the morning, and 1 capsule in the evening    . omeprazole (PRILOSEC) 20 MG capsule Take 20 mg by mouth daily.    Marland Kitchen. oxybutynin (DITROPAN XL) 15 MG 24 hr tablet Take 15 mg by mouth 2 (two) times daily.    . Pyridoxine HCl (VITAMIN B-6) 500 MG tablet Take 500 mg by mouth 2 (two) times daily. Reported on 04/04/2015    . vitamin B-12 (CYANOCOBALAMIN) 1000 MCG tablet Take 1,000 mcg by mouth daily. Reported on 04/04/2015     . HYDROcodone-acetaminophen (NORCO/VICODIN) 5-325 MG tablet Take 1 tablet by mouth every 6 (six) hours as needed for moderate pain. (Patient not taking: Reported on 09/29/2017) 12 tablet 0   No current facility-administered medications for this visit.      Physical Exam  Blood pressure (!) 152/87, pulse 78, height 5\' 1"  (1.549 m), weight 180 lb (81.6 kg).  Constitutional: overall normal hygiene, normal nutrition, well developed, normal grooming, normal body habitus. Assistive device:none  Musculoskeletal: gait and station Limp none, muscle tone and strength are normal, no tremors or atrophy is present.  .  Neurological: coordination overall normal.  Deep tendon reflex/nerve stretch intact.  Sensation normal.  Cranial nerves II-XII intact.   Skin:   Normal overall no scars, lesions, ulcers or rashes. No psoriasis.  Psychiatric: Alert and oriented x 3.  Recent memory intact, remote memory unclear.  Normal mood and affect. Well groomed.  Good eye contact.  Cardiovascular: overall no swelling, no varicosities, no edema bilaterally, normal temperatures of the legs and arms, no clubbing, cyanosis and good capillary refill.  Lymphatic: palpation is normal.  Right knee has slight effusion, ROM 0 to 110, no limp, she is tender over the posterior distal hamstring medially but has no ecchymosis or swelling here.  I feel no defect.  NV intact. All other systems reviewed and are negative   The patient has been educated about the nature of the problem(s) and counseled on treatment options.  The patient appeared to understand what I have discussed and is in agreement with it.  Encounter Diagnosis  Name Primary?  . Chronic pain of right knee Yes    PLAN Call if any problems.  Precautions discussed.  Continue current medications.   Return to clinic 1 month   Electronically Signed Darreld McleanWayne Malya Cirillo, MD 8/7/20198:25 AM

## 2017-10-27 ENCOUNTER — Ambulatory Visit: Payer: BLUE CROSS/BLUE SHIELD | Admitting: Orthopaedic Surgery

## 2017-11-16 DIAGNOSIS — M549 Dorsalgia, unspecified: Secondary | ICD-10-CM | POA: Diagnosis not present

## 2017-11-16 DIAGNOSIS — M62830 Muscle spasm of back: Secondary | ICD-10-CM | POA: Diagnosis not present

## 2018-03-01 NOTE — Progress Notes (Signed)
Primary Care Physician:  Ladon Applebaum Primary Gastroenterologist:  Dr. Jena Gauss   Chief Complaint  Patient presents with  . Dysphagia    going on x 1 yr. Happens random    HPI:   [name] is a 58 y.o. female presenting today at the request of Terie Purser, [place], due to dysphagia. Last colonoscopy normal in 2012. Last EGD 2012 with non-critical Schatzki's ring, s/p dilation. Patulous EG junction. Small hiatal hernia, otherwise normal stomach, first and second portion of duodenum  Notes chest discomfort  off and on for a year. With walking and exertion, has a sore pain in mid chest area/throat. When in the cold, painful. Sometimes notes wheezing after chest pain. Resolved with rest.   Intermittent solid food dysphagia, sometimes gets choked. Reflux is bad at times. Takes Prilosec, sometimes taking BID. Has been taking for years. Working somewhat. Has tried home remedies as well. Has tried Nexium without improvement. Sometimes reflux is so bad that nothing helps it. Mylanta helps ease but then causes diarrhea. No weight loss or lack of appetite. Has knee and back problems. Had tried slim fast over the summer and lost 20 lbs. Stopped the diet and regained.    Past Medical History:  Diagnosis Date  . Anxiety   . Arthritis   . GERD (gastroesophageal reflux disease)   . Hypertension   . S/P endoscopy    2008: noncritical Schatzki ring s/p dilation, normal esophagus and stomach, 2003: normal esophagus, s/p Maloney dilation, multiple antral erosions consistent with chronic gastritis, no H.pylori,     Past Surgical History:  Procedure Laterality Date  . CESAREAN SECTION    . CHOLECYSTECTOMY    . COLONOSCOPY  10/17/2010   normal   . ESOPHAGOGASTRODUODENOSCOPY  10/17/2010   non-critical Schatzki's ring s/p dilation, patulous EG junction, small hiatal hernia, otherwise normal stomach, first and second portion of duodenum  . FOOT SURGERY Right    heel spur  . HAND  SURGERY Left    carpal tunnel  . KNEE ARTHROSCOPY WITH MEDIAL MENISECTOMY  01/07/2012   Procedure: KNEE ARTHROSCOPY WITH MEDIAL MENISECTOMY;  Surgeon: Darreld Mclean, MD;  Location: [facility];  Service: Orthopedics;  Laterality: Right;  . MALONEY DILATION  10/17/2010   Procedure: [name] DILATION;  Surgeon: Corbin Ade, MD;  [facility]: [facility];  Service: Endoscopy;  Laterality: N/A;  . TRIGGER FINGER RELEASE Right 08/20/2016   Procedure: RELEASE A-1 PULLEY RIGHT LONG FINGER;  Surgeon: Vickki Hearing, MD;  Location: [facility];  Service: Orthopedics;  Laterality: Right;    Current Outpatient Medications  Medication Sig Dispense Refill  . diclofenac (VOLTAREN) 75 MG EC tablet Take 1 tablet (75 mg total) by mouth 2 (two) times daily. 60 tablet 5  . hydrochlorothiazide (HYDRODIURIL) 25 MG tablet Take 25 mg by mouth daily.    Marland Kitchen omeprazole (PRILOSEC) 20 MG capsule Take 20 mg by mouth daily.    Marland Kitchen oxybutynin (DITROPAN XL) 15 MG 24 hr tablet Take 15 mg by mouth 2 (two) times daily.    . vitamin B-12 (CYANOCOBALAMIN) 1000 MCG tablet Take 1,000 mcg by mouth daily. Reported on 04/04/2015    . pantoprazole (PROTONIX) 40 MG tablet Take 1 tablet (40 mg total) by mouth daily. Take 30 minutes before breakfast on empty stomach 90 tablet 3   No current facility-administered medications for this visit.     Allergies as of 03/02/2018 - Review Complete 03/02/2018  Allergen Reaction Noted  .  Tylenol [acetaminophen]  03/02/2018    Family History  Problem Relation Age of Onset  . Cancer Father        esophagus ca, living  . Colon cancer Paternal Uncle        in 8160s, early 670s  . Colon polyps Neg Hx     Social History   Socioeconomic History  . Marital status: Married    Spouse name: Not on file  . Number of children: Not on file  . Years of education: Not on file  . Highest education level: Not on file  Occupational History  . Occupation: Electrical engineerrontier Spinning    Employer: [employer]    Social Needs  . Financial resource strain: Not on file  . Food insecurity:    Worry: Not on file    Inability: Not on file  . Transportation needs:    Medical: Not on file    Non-medical: Not on file  Tobacco Use  . Smoking status: Never Smoker  . Smokeless tobacco: Never Used  Substance and Sexual Activity  . Alcohol use: Yes    Comment: drinks gin on weekends   . Drug use: No  . Sexual activity: Yes    Birth control/protection: Post-menopausal  Lifestyle  . Physical activity:    Days per week: Not on file    Minutes per session: Not on file  . Stress: Not on file  Relationships  . Social connections:    Talks on phone: Not on file    Gets together: Not on file    Attends religious service: Not on file    Active member of club or organization: Not on file    Attends meetings of clubs or organizations: Not on file    Relationship status: Not on file  . Intimate partner violence:    Fear of current or ex partner: Not on file    Emotionally abused: Not on file    Physically abused: Not on file    Forced sexual activity: Not on file  Other Topics Concern  . Not on file  Social History Narrative  . Not on file    Review of Systems: Gen: Denies any fever, chills, fatigue, weight loss, lack of appetite.  CV: see HPI  Resp: see HPI  GI: see HPI  GU : Denies urinary burning, urinary frequency, urinary hesitancy MS: Denies joint pain, muscle weakness, cramps, or limitation of movement.  Derm: Denies rash, itching, dry skin Psych: Denies depression, anxiety, memory loss, and confusion Heme: Denies bruising, bleeding, and enlarged lymph nodes.  Physical Exam: BP (!) 153/93   Pulse 81   Temp (!) 97 F (36.1 C) (Oral)   Ht 5\' 1"  (1.549 m)   Wt 199 lb 12.8 oz (90.6 kg)   BMI 37.75 kg/m  General:   Alert and oriented. Pleasant and cooperative. Well-nourished and well-developed.  Head:  Normocephalic and atraumatic. Eyes:  Without icterus, sclera clear and conjunctiva  pink.  Ears:  Normal auditory acuity. Nose:  No deformity, discharge,  or lesions. Mouth:  No deformity or lesions, oral mucosa pink.  Lungs:  Clear to auscultation bilaterally. No wheezes, rales, or rhonchi. No distress.  Heart:  S1, S2 present without murmurs appreciated.  Abdomen:  +BS, soft, non-tender and non-distended. No HSM noted. No guarding or rebound. No masses appreciated.  Rectal:  Deferred  Msk:  Symmetrical without gross deformities. Normal posture. Extremities:  Without  edema. Neurologic:  Alert and  oriented x4 Psych:  Alert  and cooperative. Normal mood and affect.

## 2018-03-02 ENCOUNTER — Encounter: Payer: Self-pay | Admitting: Gastroenterology

## 2018-03-02 ENCOUNTER — Ambulatory Visit: Payer: BLUE CROSS/BLUE SHIELD | Admitting: Gastroenterology

## 2018-03-02 ENCOUNTER — Other Ambulatory Visit: Payer: Self-pay

## 2018-03-02 ENCOUNTER — Encounter: Payer: Self-pay | Admitting: Internal Medicine

## 2018-03-02 VITALS — BP 153/93 | HR 81 | Temp 97.0°F | Ht 61.0 in | Wt 199.8 lb

## 2018-03-02 DIAGNOSIS — K219 Gastro-esophageal reflux disease without esophagitis: Secondary | ICD-10-CM | POA: Diagnosis not present

## 2018-03-02 DIAGNOSIS — R1310 Dysphagia, unspecified: Secondary | ICD-10-CM | POA: Diagnosis not present

## 2018-03-02 DIAGNOSIS — R079 Chest pain, unspecified: Secondary | ICD-10-CM | POA: Diagnosis not present

## 2018-03-02 MED ORDER — PANTOPRAZOLE SODIUM 40 MG PO TBEC
40.0000 mg | DELAYED_RELEASE_TABLET | Freq: Every day | ORAL | 3 refills | Status: DC
Start: 2018-03-02 — End: 2018-08-19

## 2018-03-02 NOTE — Assessment & Plan Note (Addendum)
Very delightful 58 year old female with history of non-critical Schatzki's ring in 2012 s/p dilation, now returning with intermittent solid food dysphagia and GERD exacerbation despite Prilosec daily to BID. As she has been on Prilosec chronically, will change to Protonix once daily. Historically has noted improvement with dilation, and EGD/dilatation will be arranged in near future. Reports of exertional chest pain deserve further evaluation: I have referred her to cardiology prior to elective EGD.  Proceed with upper endoscopy/dilatation in the near future with Dr. Jena Gauss. The risks, benefits, and alternatives have been discussed in detail with patient. They have stated understanding and desire to proceed.  Stop Prilosec. Start Protonix once daily. Previously failed Nexium Cardiac evaluation GERD dietary/behavior modifications discussed Return in 4 months  Addendum on 04/11/2018: stress test low risk study and ECHO completed. No further testing per cardiology. May proceed with endoscopy.

## 2018-03-02 NOTE — Patient Instructions (Addendum)
I would like for you to stop Prilosec. I have sent in Protonix to take once each morning, 30 minutes before breakfast.  I would like for you to see the cardiologist prior to the upper endoscopy.   We have arranged an upper endoscopy with dilation in the near future with Dr. Jena Gauss.  We will see you back in 4 months!  It was a pleasure to see you today. I strive to create trusting relationships with patients to provide genuine, compassionate, and quality care. I value your feedback. If you receive a survey regarding your visit,  I greatly appreciate you taking time to fill this out.   Christina Mink, PhD, ANP-BC [facility]    Gastroesophageal Reflux Disease, Adult Gastroesophageal reflux (GER) happens when acid from the stomach flows up into the tube that connects the mouth and the stomach (esophagus). Normally, food travels down the esophagus and stays in the stomach to be digested. However, when a person has GER, food and stomach acid sometimes move back up into the esophagus. If this becomes a more serious problem, the person may be diagnosed with a disease called gastroesophageal reflux disease (GERD). GERD occurs when the reflux:  Happens often.  Causes frequent or severe symptoms.  Causes problems such as damage to the esophagus. When stomach acid comes in contact with the esophagus, the acid may cause soreness (inflammation) in the esophagus. Over time, GERD may create small holes (ulcers) in the lining of the esophagus. What are the causes? This condition is caused by a problem with the muscle between the esophagus and the stomach (lower esophageal sphincter, or LES). Normally, the LES muscle closes after food passes through the esophagus to the stomach. When the LES is weakened or abnormal, it does not close properly, and that allows food and stomach acid to go back up into the esophagus. The LES can be weakened by certain dietary substances, medicines, and medical  conditions, including:  Tobacco use.  Pregnancy.  Having a hiatal hernia.  Alcohol use.  Certain foods and beverages, such as coffee, chocolate, onions, and peppermint. What increases the risk? You are more likely to develop this condition if you:  Have an increased body weight.  Have a connective tissue disorder.  Use NSAID medicines. What are the signs or symptoms? Symptoms of this condition include:  Heartburn.  Difficult or painful swallowing.  The feeling of having a lump in the throat.  Abitter taste in the mouth.  Bad breath.  Having a large amount of saliva.  Having an upset or bloated stomach.  Belching.  Chest pain. Different conditions can cause chest pain. Make sure you see your health care provider if you experience chest pain.  Shortness of breath or wheezing.  Ongoing (chronic) cough or a night-time cough.  Wearing away of tooth enamel.  Weight loss. How is this diagnosed? Your health care provider will take a medical history and perform a physical exam. To determine if you have mild or severe GERD, your health care provider may also monitor how you respond to treatment. You may also have tests, including:  A test to examine your stomach and esophagus with a small camera (endoscopy).  A test thatmeasures the acidity level in your esophagus.  A test thatmeasures how much pressure is on your esophagus.  A barium swallow or modified barium swallow test to show the shape, size, and functioning of your esophagus. How is this treated? The goal of treatment is to help relieve your symptoms  and to prevent complications. Treatment for this condition may vary depending on how severe your symptoms are. Your health care provider may recommend:  Changes to your diet.  Medicine.  Surgery. Follow these instructions at home: Eating and drinking   Follow a diet as recommended by your health care provider. This may involve avoiding foods and drinks  such as: ? Coffee and tea (with or without caffeine). ? Drinks that containalcohol. ? Energy drinks and sports drinks. ? Carbonated drinks or sodas. ? Chocolate and cocoa. ? Peppermint and mint flavorings. ? Garlic and onions. ? Horseradish. ? Spicy and acidic foods, including peppers, chili powder, curry powder, vinegar, hot sauces, and barbecue sauce. ? Citrus fruit juices and citrus fruits, such as oranges, lemons, and limes. ? Tomato-based foods, such as red sauce, chili, salsa, and pizza with red sauce. ? Fried and fatty foods, such as donuts, french fries, potato chips, and high-fat dressings. ? High-fat meats, such as hot dogs and fatty cuts of red and white meats, such as rib eye steak, sausage, ham, and bacon. ? High-fat dairy items, such as whole milk, butter, and cream cheese.  Eat small, frequent meals instead of large meals.  Avoid drinking large amounts of liquid with your meals.  Avoid eating meals during the 2-3 hours before bedtime.  Avoid lying down right after you eat.  Do not exercise right after you eat. Lifestyle   Do not use any products that contain nicotine or tobacco, such as cigarettes, e-cigarettes, and chewing tobacco. If you need help quitting, ask your health care provider.  Try to reduce your stress by using methods such as yoga or meditation. If you need help reducing stress, ask your health care provider.  If you are overweight, reduce your weight to an amount that is healthy for you. Ask your health care provider for guidance about a safe weight loss goal. General instructions  Pay attention to any changes in your symptoms.  Take over-the-counter and prescription medicines only as told by your health care provider. Do not take aspirin, ibuprofen, or other NSAIDs unless your health care provider told you to do so.  Wear loose-fitting clothing. Do not wear anything tight around your waist that causes pressure on your abdomen.  Raise (elevate)  the head of your bed about 6 inches (15 cm).  Avoid bending over if this makes your symptoms worse.  Keep all follow-up visits as told by your health care provider. This is important. Contact a health care provider if:  You have: ? New symptoms. ? Unexplained weight loss. ? Difficulty swallowing or it hurts to swallow. ? Wheezing or a persistent cough. ? A hoarse voice.  Your symptoms do not improve with treatment. Get help right away if you:  Have pain in your arms, neck, jaw, teeth, or back.  Feel sweaty, dizzy, or light-headed.  Have chest pain or shortness of breath.  Vomit and your vomit looks like blood or coffee grounds.  Faint.  Have stool that is bloody or black.  Cannot swallow, drink, or eat. Summary  Gastroesophageal reflux happens when acid from the stomach flows up into the esophagus. GERD is a disease in which the reflux happens often, causes frequent or severe symptoms, or causes problems such as damage to the esophagus.  Treatment for this condition may vary depending on how severe your symptoms are. Your health care provider may recommend diet and lifestyle changes, medicine, or surgery.  Contact a health care provider if you have new  or worsening symptoms.  Take over-the-counter and prescription medicines only as told by your health care provider. Do not take aspirin, ibuprofen, or other NSAIDs unless your health care provider told you to do so.  Keep all follow-up visits as told by your health care provider. This is important. This information is not intended to replace advice given to you by your health care provider. Make sure you discuss any questions you have with your health care provider. Document Released: 11/19/2004 Document Revised: 08/18/2017 Document Reviewed: 08/18/2017 Elsevier Interactive Patient Education  2019 [employer]

## 2018-03-02 NOTE — Assessment & Plan Note (Signed)
Exertional. No recent formal cardiology evaluation. Needs clearance prior to elective EGD.

## 2018-03-02 NOTE — Assessment & Plan Note (Signed)
Prilosec changed to Protonix. Recent weight gain. Discussed diet/behavior/lifestyle modification

## 2018-03-03 NOTE — Progress Notes (Signed)
CC'D TO PCP °

## 2018-03-11 DIAGNOSIS — K219 Gastro-esophageal reflux disease without esophagitis: Secondary | ICD-10-CM | POA: Diagnosis not present

## 2018-03-11 DIAGNOSIS — R0789 Other chest pain: Secondary | ICD-10-CM | POA: Diagnosis not present

## 2018-03-11 DIAGNOSIS — Z01411 Encounter for gynecological examination (general) (routine) with abnormal findings: Secondary | ICD-10-CM | POA: Diagnosis not present

## 2018-03-11 DIAGNOSIS — Z6837 Body mass index (BMI) 37.0-37.9, adult: Secondary | ICD-10-CM | POA: Diagnosis not present

## 2018-03-11 DIAGNOSIS — N3946 Mixed incontinence: Secondary | ICD-10-CM | POA: Diagnosis not present

## 2018-03-11 DIAGNOSIS — Z1389 Encounter for screening for other disorder: Secondary | ICD-10-CM | POA: Diagnosis not present

## 2018-03-11 DIAGNOSIS — I10 Essential (primary) hypertension: Secondary | ICD-10-CM | POA: Diagnosis not present

## 2018-03-15 ENCOUNTER — Ambulatory Visit: Payer: BLUE CROSS/BLUE SHIELD | Admitting: Cardiology

## 2018-03-15 ENCOUNTER — Encounter: Payer: Self-pay | Admitting: Cardiology

## 2018-03-15 VITALS — BP 132/70 | HR 82 | Ht 61.0 in | Wt 200.0 lb

## 2018-03-15 DIAGNOSIS — K219 Gastro-esophageal reflux disease without esophagitis: Secondary | ICD-10-CM

## 2018-03-15 DIAGNOSIS — I10 Essential (primary) hypertension: Secondary | ICD-10-CM | POA: Diagnosis not present

## 2018-03-15 DIAGNOSIS — R072 Precordial pain: Secondary | ICD-10-CM

## 2018-03-15 DIAGNOSIS — R079 Chest pain, unspecified: Secondary | ICD-10-CM | POA: Diagnosis not present

## 2018-03-15 DIAGNOSIS — R011 Cardiac murmur, unspecified: Secondary | ICD-10-CM

## 2018-03-15 NOTE — Progress Notes (Signed)
Cardiology Office Note  Date: 03/15/2018   ID: [name], DOB 15-Mar-1960, MRN [id number]  PCP: Avis Epley, PA-C  Consulting Cardiologist: Nona Dell, MD   Chief Complaint  Patient presents with  . Chest Pain    History of Present Illness: [name] is a 58 y.o. female referred for cardiology consultation by [name] for the evaluation of chest pain. GI history includes previously dilated Schatzki's ring with GERD.  She is being considered for an elective EGD in the near future with Dr. Jena Gauss.  She reports a year-long history of intermittent chest discomfort similar to what it feels like when 1 goes outside and breathes in cold air.  This has been happening intermittently with exertion, but not on a consistent basis.  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  This does not bring on symptoms on a regular basis.  Usually when she feels this way she can rest for a few minutes and it stops.  She also has a feeling of wheezing.  She has had no palpitations or syncope.  She does not report any family history of premature cardiovascular disease.  She has a daughter who had a peripartum cardiomyopathy.  She does report hypertension but states that she has only had to take hydrochlorothiazide over time.  I reviewed her ECG today which shows sinus rhythm with low voltage and poor R wave progression anteriorly.  Past Medical History:  Diagnosis Date  . Anxiety   . Arthritis   . GERD (gastroesophageal reflux disease)   . Hypertension   . S/P endoscopy    2008: noncritical Schatzki ring s/p dilation, normal esophagus and stomach, 2003: normal esophagus, s/p Maloney dilation, multiple antral erosions consistent with chronic gastritis, no H.pylori,     Past Surgical History:  Procedure Laterality Date  . CESAREAN SECTION    . CHOLECYSTECTOMY    . COLONOSCOPY  10/17/2010   normal   . ESOPHAGOGASTRODUODENOSCOPY  10/17/2010   non-critical Schatzki's  ring s/p dilation, patulous EG junction, small hiatal hernia, otherwise normal stomach, first and second portion of duodenum  . FOOT SURGERY Right    heel spur  . HAND SURGERY Left    carpal tunnel  . KNEE ARTHROSCOPY WITH MEDIAL MENISECTOMY  01/07/2012   Procedure: KNEE ARTHROSCOPY WITH MEDIAL MENISECTOMY;  Surgeon: Darreld Mclean, MD;  Location: [facility];  Service: Orthopedics;  Laterality: Right;  . MALONEY DILATION  10/17/2010   Procedure: [name] DILATION;  Surgeon: Corbin Ade, MD;  [facility];  Service: Endoscopy;  Laterality: N/A;  . TRIGGER FINGER RELEASE Right 08/20/2016   Procedure: RELEASE A-1 PULLEY RIGHT LONG FINGER;  Surgeon: Vickki Hearing, MD;  Location: [facility];  Service: Orthopedics;  Laterality: Right;    Current Outpatient Medications  Medication Sig Dispense Refill  . diclofenac (VOLTAREN) 75 MG EC tablet Take 1 tablet (75 mg total) by mouth 2 (two) times daily. 60 tablet 5  . hydrochlorothiazide (HYDRODIURIL) 25 MG tablet Take 25 mg by mouth daily.    Marland Kitchen oxybutynin (DITROPAN XL) 15 MG 24 hr tablet Take 15 mg by mouth 2 (two) times daily.    . pantoprazole (PROTONIX) 40 MG tablet Take 1 tablet (40 mg total) by mouth daily. Take 30 minutes before breakfast on empty stomach 90 tablet 3  . vitamin B-12 (CYANOCOBALAMIN) 1000 MCG tablet Take 1,000 mcg by mouth daily. Reported on 04/04/2015     No current facility-administered medications  for this visit.    Allergies:  Tylenol [acetaminophen]   Social History: The patient  reports that she has never smoked. She has never used smokeless tobacco. She reports current alcohol use. She reports that she does not use drugs.   Family History: The patient's family history includes Cancer in her father; Colon cancer in her paternal uncle.   ROS:  Please see the history of present illness. Otherwise, complete review of systems is positive for none.  All other systems are reviewed and negative.   Physical Exam: VS:  BP  132/70 (BP Location: Right Arm)   Pulse 82   Ht 5\' 1"  (1.549 m)   Wt 200 lb (90.7 kg)   SpO2 97%   BMI 37.79 kg/m , BMI Body mass index is 37.79 kg/m.  Wt Readings from Last 3 Encounters:  03/15/18 200 lb (90.7 kg)  03/02/18 199 lb 12.8 oz (90.6 kg)  09/29/17 180 lb (81.6 kg)    General: Patient appears comfortable at rest. HEENT: Conjunctiva and lids normal, oropharynx clear. Neck: Supple, no elevated JVP or carotid bruits, no thyromegaly. Lungs: Clear to auscultation, nonlabored breathing at rest. Cardiac: Regular rate and rhythm, no S3, 2/6 systolic murmur, no pericardial rub. Abdomen: Soft, nontender, bowel sounds present. Extremities: No pitting edema, distal pulses 2+. Skin: Warm and dry. Musculoskeletal: No kyphosis. Neuropsychiatric: Alert and oriented x3, affect grossly appropriate.  ECG: I personally reviewed the tracing from 08/18/2016 which showed sinus rhythm with low voltage and poor R wave progression.  Recent Labwork:  June 2018: Hemoglobin 13.4, platelets 296, potassium 3.6, BUN 17, creatinine 0.52  Assessment and Plan:  1.  Intermittent exertional chest discomfort in a 58 year old woman with a history of hypertension, abnormal ECG at baseline, no known history of hyperlipidemia or type 2 diabetes mellitus.  He also has a 2/6 systolic murmur at the base in aortic position.  Plan is to obtain an echocardiogram for cardiac structural assessment and also an exercise echocardiogram to evaluate for ischemia.  2.  Essential hypertension, on hydrochlorothiazide.  3.  GERD and dysphasia, on Protonix.  She follows with Dr. Jena Gaussourk with plan for EGD.  Current medicines were reviewed with the patient today.   Orders Placed This Encounter  Procedures  . NM Myocar Multi W/Spect W/Wall Motion / EF  . EKG 12-Lead  . ECHOCARDIOGRAM COMPLETE    Disposition: Call with test results.  Signed, Jonelle SidleSamuel G. Alejandria Wessells, MD, [facility] Hospital[facility]ACC 03/15/2018 3:08 PM    [facility]  [facility] at [facility] [address], [place], [place] [address] Phone: [phone]) [phone]; Fax: [phone]

## 2018-03-15 NOTE — Patient Instructions (Signed)
Medication Instructions:  Your physician recommends that you continue on your current medications as directed. Please refer to the Current Medication list given to you today.  If you need a refill on your cardiac medications before your next appointment, please call your pharmacy.   Lab work: None today If you have labs (blood work) drawn today and your tests are completely normal, you will receive your results only by: Marland Kitchen MyChart Message (if you have MyChart) OR . A paper copy in the mail If you have any lab test that is abnormal or we need to change your treatment, we will call you to review the results.  Testing/Procedures: Your physician has requested that you have an echocardiogram. Echocardiography is a painless test that uses sound waves to create images of your heart. It provides your doctor with information about the size and shape of your heart and how well your heart's chambers and valves are working. This procedure takes approximately one hour. There are no restrictions for this procedure.  Your physician has requested that you have en exercise stress myoview. For further information please visit [email or web address] Please follow instruction sheet, as given.   Follow-Up:We will call you with the results.  Any Other Special Instructions Will Be Listed Below (If Applicable). None

## 2018-03-25 ENCOUNTER — Encounter (HOSPITAL_BASED_OUTPATIENT_CLINIC_OR_DEPARTMENT_OTHER)
Admission: RE | Admit: 2018-03-25 | Discharge: 2018-03-25 | Disposition: A | Discharge status: Home / Self Care | Payer: BLUE CROSS/BLUE SHIELD | Source: Ambulatory Visit | Attending: Cardiology | Admitting: Cardiology

## 2018-03-25 ENCOUNTER — Ambulatory Visit (HOSPITAL_COMMUNITY)
Admission: RE | Admit: 2018-03-25 | Discharge: 2018-03-25 | Disposition: A | Discharge status: Home / Self Care | Payer: BLUE CROSS/BLUE SHIELD | Source: Ambulatory Visit | Attending: Cardiology | Admitting: Cardiology

## 2018-03-25 ENCOUNTER — Encounter (HOSPITAL_COMMUNITY): Payer: Self-pay

## 2018-03-25 ENCOUNTER — Encounter (HOSPITAL_COMMUNITY)
Admission: RE | Admit: 2018-03-25 | Discharge: 2018-03-25 | Disposition: A | Discharge status: Home / Self Care | Payer: BLUE CROSS/BLUE SHIELD | Source: Ambulatory Visit | Attending: Cardiology | Admitting: Cardiology

## 2018-03-25 DIAGNOSIS — R011 Cardiac murmur, unspecified: Secondary | ICD-10-CM

## 2018-03-25 DIAGNOSIS — R079 Chest pain, unspecified: Secondary | ICD-10-CM | POA: Insufficient documentation

## 2018-03-25 LAB — NM MYOCAR MULTI W/SPECT W/WALL MOTION / EF
CHL CUP MPHR: 163 {beats}/min
CHL CUP NUCLEAR SRS: 0
CHL CUP NUCLEAR SSS: 1
CHL CUP RESTING HR STRESS: 70 {beats}/min
CHL RATE OF PERCEIVED EXERTION: 11
CSEPED: 5 min
CSEPEDS: 30 s
CSEPHR: 87 %
Estimated workload: 7 METS
LV dias vol: 51 mL (ref 46–106)
LV sys vol: 14 mL
Peak HR: 142 {beats}/min
RATE: 0.41
SDS: 1
TID: 0.82

## 2018-03-25 MED ORDER — REGADENOSON 0.4 MG/5ML IV SOLN
INTRAVENOUS | Status: AC
  Filled 2018-03-25: qty 5

## 2018-03-25 MED ORDER — TECHNETIUM TC 99M TETROFOSMIN IV KIT
30.0000 | PACK | Freq: Once | INTRAVENOUS | Status: AC | PRN
Start: 2018-03-25 — End: 2018-03-25
  Administered 2018-03-25: 30 via INTRAVENOUS

## 2018-03-25 MED ORDER — TECHNETIUM TC 99M TETROFOSMIN IV KIT
10.0000 | PACK | Freq: Once | INTRAVENOUS | Status: AC | PRN
  Administered 2018-03-25: 11 via INTRAVENOUS

## 2018-03-25 MED ORDER — SODIUM CHLORIDE 0.9% FLUSH
INTRAVENOUS | Status: AC
  Administered 2018-03-25: 10 mL via INTRAVENOUS
  Filled 2018-03-25: qty 10

## 2018-03-25 NOTE — Progress Notes (Signed)
*  PRELIMINARY RESULTS* Echocardiogram 2D Echocardiogram has been performed.  [name] 03/25/2018, 11:46 AM

## 2018-04-11 ENCOUNTER — Telehealth: Payer: Self-pay | Admitting: Gastroenterology

## 2018-04-11 NOTE — Telephone Encounter (Signed)
Holding spot for EGD/DIL w/RMR 05/11/18. Tried to call pt, no answer, LMOVM for return call.

## 2018-04-11 NOTE — Telephone Encounter (Signed)
Please arranged EGD/dilatation with Dr. Jena Gauss. She was seen in office on 03/02/2018 with plans for EGD/dilataion after evaluated by cardiology. She has been cleared.

## 2018-04-12 NOTE — Telephone Encounter (Signed)
Called pt, she will check her calendar and call office back tomorrow.

## 2018-04-13 ENCOUNTER — Telehealth: Payer: Self-pay | Admitting: Internal Medicine

## 2018-04-13 ENCOUNTER — Other Ambulatory Visit: Payer: Self-pay

## 2018-04-13 DIAGNOSIS — R1310 Dysphagia, unspecified: Secondary | ICD-10-CM

## 2018-04-13 NOTE — Telephone Encounter (Signed)
See other phone note

## 2018-04-13 NOTE — Telephone Encounter (Signed)
Pt called office and told SS it was ok for procedure 05/11/18.  EGD/DIL w/RMR scheduled for 05/11/18 at 7:30am. Orders entered. Instructions mailed. Called and informed pt.

## 2018-04-13 NOTE — Telephone Encounter (Signed)
Pt said the date you were holding for her procedure (05/11/2018 with RMR) would be fine.

## 2018-04-24 DIAGNOSIS — R52 Pain, unspecified: Secondary | ICD-10-CM | POA: Diagnosis not present

## 2018-04-24 DIAGNOSIS — Z6835 Body mass index (BMI) 35.0-35.9, adult: Secondary | ICD-10-CM | POA: Diagnosis not present

## 2018-04-24 DIAGNOSIS — R05 Cough: Secondary | ICD-10-CM | POA: Diagnosis not present

## 2018-04-24 DIAGNOSIS — J329 Chronic sinusitis, unspecified: Secondary | ICD-10-CM | POA: Diagnosis not present

## 2018-05-09 ENCOUNTER — Telehealth: Payer: Self-pay

## 2018-05-09 NOTE — Telephone Encounter (Signed)
Tried to call pt, no answer, LMOVM for return call.  

## 2018-05-09 NOTE — Telephone Encounter (Signed)
Tried to call pt to reschedule EGD/DIL w/RMR that was for 05/11/18 (RMR will be out), no answer, LMOVM for return call.

## 2018-05-10 NOTE — Telephone Encounter (Signed)
Tried to call pt, no answer, LMOVM. Informed her procedure for 05/11/18 w/RMR has been cancelled d/t RMR is out this week. She can call our office to reschedule procedure.

## 2018-05-10 NOTE — Telephone Encounter (Signed)
Called pt's husband, informed him that her procedure has been cancelled for tomorrow. He will let her know to call office to reschedule.

## 2018-05-11 ENCOUNTER — Ambulatory Visit (HOSPITAL_COMMUNITY)
Admission: RE | Admit: 2018-05-11 | Payer: BLUE CROSS/BLUE SHIELD | Source: Home / Self Care | Admitting: Internal Medicine

## 2018-05-11 ENCOUNTER — Encounter (HOSPITAL_COMMUNITY): Admission: RE | Payer: Self-pay | Source: Home / Self Care

## 2018-05-11 SURGERY — EGD (ESOPHAGOGASTRODUODENOSCOPY)
Anesthesia: Moderate Sedation

## 2018-07-06 ENCOUNTER — Ambulatory Visit: Payer: BLUE CROSS/BLUE SHIELD | Admitting: Gastroenterology

## 2018-08-19 ENCOUNTER — Other Ambulatory Visit: Payer: Self-pay

## 2018-08-19 ENCOUNTER — Encounter: Payer: Self-pay | Admitting: Internal Medicine

## 2018-08-19 ENCOUNTER — Encounter: Payer: Self-pay | Admitting: Gastroenterology

## 2018-08-19 ENCOUNTER — Ambulatory Visit (INDEPENDENT_AMBULATORY_CARE_PROVIDER_SITE_OTHER): Payer: BC Managed Care – PPO | Admitting: Gastroenterology

## 2018-08-19 VITALS — BP 148/84 | HR 80 | Temp 98.6°F | Ht 61.0 in | Wt 206.6 lb

## 2018-08-19 DIAGNOSIS — R1310 Dysphagia, unspecified: Secondary | ICD-10-CM

## 2018-08-19 MED ORDER — PANTOPRAZOLE SODIUM 40 MG PO TBEC: 40.0000 mg | DELAYED_RELEASE_TABLET | Freq: Two times a day (BID) | ORAL | 3 refills | Status: DC

## 2018-08-19 NOTE — Progress Notes (Signed)
Referring Provider: Jake Samples, PA* Primary Care Physician:  Jake Samples, PA-C  Primary GI: Dr. Gala Romney  Chief Complaint  Patient presents with  . Dysphagia    Discomfort in chest,SOB when she exerts herself    HPI:   [name] is a 58 y.o. female presenting today with a history of non-critical Schatzki's ring in 2012 s/p dilation, now returning with intermittent solid food dysphagia and GERD exacerbation despite Prilosec daily to BID. She was placed on Protonix in Jan 2020 when I last saw her and EGD/dilatation was scheduled. Due to reports of exertional chest pain, she also underwent cardiology evaluation with low risk stress test study and ECHO completed, with no further testing recommended per cardiology. Needs to arrange EGD/dilatation, as previously had to be cancelled due to scheduling conflict.   Some DOE. No chest pain.  Dad has history of esophageal cancer. Still with solid food dysphagia. Protonix once daily. Feels like she may need BID, because she has had to take in evenings at times. Naproxen prn. No abdominal pain. No constipation or diarrhea. No overt GI bleeding.   Past Medical History:  Diagnosis Date  . Anxiety   . Arthritis   . GERD (gastroesophageal reflux disease)   . Hypertension   . S/P endoscopy    2008: noncritical Schatzki ring s/p dilation, normal esophagus and stomach, 2003: normal esophagus, s/p Maloney dilation, multiple antral erosions consistent with chronic gastritis, no H.pylori,     Past Surgical History:  Procedure Laterality Date  . CESAREAN SECTION    . CHOLECYSTECTOMY    . COLONOSCOPY  10/17/2010   normal   . ESOPHAGOGASTRODUODENOSCOPY  10/17/2010   non-critical Schatzki's ring s/p dilation, patulous EG junction, small hiatal hernia, otherwise normal stomach, first and second portion of duodenum  . FOOT SURGERY Right    heel spur  . HAND SURGERY Left    carpal tunnel  . KNEE ARTHROSCOPY WITH MEDIAL MENISECTOMY   01/07/2012   Procedure: KNEE ARTHROSCOPY WITH MEDIAL MENISECTOMY;  Surgeon: Sanjuana Kava, MD;  Location: [facility];  Service: Orthopedics;  Laterality: Right;  . MALONEY DILATION  10/17/2010   Procedure: Venia Minks DILATION;  Surgeon: Daneil Dolin, MD;  Location: [facility];  Service: Endoscopy;  Laterality: N/A;  . TRIGGER FINGER RELEASE Right 08/20/2016   Procedure: RELEASE A-1 PULLEY RIGHT LONG FINGER;  Surgeon: Carole Civil, MD;  Location: [facility];  Service: Orthopedics;  Laterality: Right;    Current Outpatient Medications  Medication Sig Dispense Refill  . Biotin w/ Vitamins C & E (HAIR/SKIN/NAILS PO) Take 1 tablet by mouth daily.    . hydrochlorothiazide (HYDRODIURIL) 25 MG tablet Take 25 mg by mouth daily.    Marland Kitchen ibuprofen (ADVIL,MOTRIN) 200 MG tablet Take 600 mg by mouth every 8 (eight) hours as needed (pain).    . naproxen sodium (ALEVE) 220 MG tablet Take 440 mg by mouth 2 (two) times daily as needed (pain.).    Marland Kitchen oxybutynin (DITROPAN XL) 15 MG 24 hr tablet Take 15 mg by mouth 2 (two) times daily.    . pantoprazole (PROTONIX) 40 MG tablet Take 1 tablet (40 mg total) by mouth daily. Take 30 minutes before breakfast on empty stomach 90 tablet 3  . diclofenac (VOLTAREN) 75 MG EC tablet Take 1 tablet (75 mg total) by mouth 2 (two) times daily. (Patient not taking: Reported on 08/19/2018) 60 tablet 5   No current facility-administered medications for this visit.  Allergies as of 08/19/2018 - Review Complete 08/19/2018  Allergen Reaction Noted  . Acetaminophen-codeine Rash 05/06/2018    Family History  Problem Relation Age of Onset  . Cancer Father        esophagus ca, living  . Colon cancer Paternal Uncle        in 8860s, early 5970s  . Colon polyps Neg Hx     Social History   Socioeconomic History  . Marital status: Married    Spouse name: Not on file  . Number of children: Not on file  . Years of education: Not on file  . Highest education level: Not on file   Occupational History  . Occupation: Electrical engineerrontier Spinning    Employer: [employer]  Social Needs  . Financial resource strain: Not on file  . Food insecurity    Worry: Not on file    Inability: Not on file  . Transportation needs    Medical: Not on file    Non-medical: Not on file  Tobacco Use  . Smoking status: Never Smoker  . Smokeless tobacco: Never Used  Substance and Sexual Activity  . Alcohol use: Yes    Comment: drinks gin on weekends   . Drug use: No  . Sexual activity: Yes    Birth control/protection: Post-menopausal  Lifestyle  . Physical activity    Days per week: Not on file    Minutes per session: Not on file  . Stress: Not on file  Relationships  . Social Musicianconnections    Talks on phone: Not on file    Gets together: Not on file    Attends religious service: Not on file    Active member of club or organization: Not on file    Attends meetings of clubs or organizations: Not on file    Relationship status: Not on file  Other Topics Concern  . Not on file  Social History Narrative  . Not on file    Review of Systems: Gen: Denies fever, chills, anorexia. Denies fatigue, weakness, weight loss.  CV: Denies chest pain, palpitations, syncope, peripheral edema, and claudication. Resp: see HPI  GI: see HPI  Derm: Denies rash, itching, dry skin Psych: Denies depression, anxiety, memory loss, confusion. No homicidal or suicidal ideation.  Heme: Denies bruising, bleeding, and enlarged lymph nodes.  Physical Exam: BP (!) 148/84   Pulse 80   Temp 98.6 F (37 C)   Ht 5\' 1"  (1.549 m)   Wt 206 lb 9.6 oz (93.7 kg)   BMI 39.04 kg/m  General:   Alert and oriented. No distress noted. Pleasant and cooperative.  Head:  Normocephalic and atraumatic. Eyes:  Conjuctiva clear without scleral icterus. Mouth:  Oral mucosa pink and moist.  Lungs: clear bilaterally Cardiac: S1 S2 present without murmurs  Abdomen:  +BS, soft, non-tender and non-distended. No rebound or  guarding. No HSM or masses noted. Msk:  Symmetrical without gross deformities. Normal posture. Extremities:  Without edema. Neurologic:  Alert and  oriented x4 Psych:  Alert and cooperative. Normal mood and affect.

## 2018-08-19 NOTE — Patient Instructions (Signed)
We have arranged an upper endoscopy with dilation by Dr. Gala Romney in the near future.  I would like to see you back in 6 months!  You may take Protonix once to twice a day, 30 minutes before breakfast and dinner. Please take it a minimum of once per day, 30 minutes before breakfast.  I enjoyed seeing you again today! As you know, I value our relationship and want to provide genuine, compassionate, and quality care. I welcome your feedback. If you receive a survey regarding your visit,  I greatly appreciate you taking time to fill this out. See you next time!  Annitta Needs, PhD, ANP-BC [facility]

## 2018-08-22 ENCOUNTER — Other Ambulatory Visit: Payer: Self-pay

## 2018-08-22 DIAGNOSIS — R1310 Dysphagia, unspecified: Secondary | ICD-10-CM

## 2018-08-24 NOTE — Progress Notes (Signed)
CC'D TO PCP °

## 2018-08-24 NOTE — Assessment & Plan Note (Signed)
58 year old female with history of non-critical Schatzki's ring s/p dilatation in 2012, now with recurrent solid food dysphagia. Historically has noted improvement with dilation.   Proceed with upper endoscopy/dilation in the near future with Dr. Gala Romney. The risks, benefits, and alternatives have been discussed in detail with patient. They have stated understanding and desire to proceed.  Increase Protonix to BID, as she has found the need for second dose frequently Return in 6 months

## 2018-10-21 ENCOUNTER — Other Ambulatory Visit (HOSPITAL_COMMUNITY)
Admission: RE | Admit: 2018-10-21 | Discharge: 2018-10-21 | Disposition: A | Discharge status: Home / Self Care | Payer: BC Managed Care – PPO | Source: Ambulatory Visit | Attending: Internal Medicine | Admitting: Internal Medicine

## 2018-10-21 ENCOUNTER — Other Ambulatory Visit: Payer: Self-pay

## 2018-10-21 DIAGNOSIS — Z01812 Encounter for preprocedural laboratory examination: Secondary | ICD-10-CM | POA: Insufficient documentation

## 2018-10-21 DIAGNOSIS — Z20828 Contact with and (suspected) exposure to other viral communicable diseases: Secondary | ICD-10-CM | POA: Insufficient documentation

## 2018-10-21 LAB — SARS CORONAVIRUS 2 (TAT 6-24 HRS): SARS Coronavirus 2: NEGATIVE

## 2018-10-26 ENCOUNTER — Encounter (HOSPITAL_COMMUNITY)
Admission: RE | Disposition: A | Discharge status: Home / Self Care | Payer: Self-pay | Source: Home / Self Care | Attending: Internal Medicine

## 2018-10-26 ENCOUNTER — Encounter (HOSPITAL_COMMUNITY): Payer: Self-pay | Admitting: *Deleted

## 2018-10-26 ENCOUNTER — Other Ambulatory Visit: Payer: Self-pay

## 2018-10-26 ENCOUNTER — Ambulatory Visit (HOSPITAL_COMMUNITY)
Admission: RE | Admit: 2018-10-26 | Discharge: 2018-10-26 | Disposition: A | Discharge status: Home / Self Care | Payer: BC Managed Care – PPO | Attending: Internal Medicine | Admitting: Internal Medicine

## 2018-10-26 DIAGNOSIS — R1310 Dysphagia, unspecified: Secondary | ICD-10-CM

## 2018-10-26 DIAGNOSIS — M1990 Unspecified osteoarthritis, unspecified site: Secondary | ICD-10-CM | POA: Diagnosis not present

## 2018-10-26 DIAGNOSIS — F419 Anxiety disorder, unspecified: Secondary | ICD-10-CM | POA: Diagnosis not present

## 2018-10-26 DIAGNOSIS — Z886 Allergy status to analgesic agent status: Secondary | ICD-10-CM | POA: Insufficient documentation

## 2018-10-26 DIAGNOSIS — Z9049 Acquired absence of other specified parts of digestive tract: Secondary | ICD-10-CM | POA: Diagnosis not present

## 2018-10-26 DIAGNOSIS — K449 Diaphragmatic hernia without obstruction or gangrene: Secondary | ICD-10-CM | POA: Diagnosis not present

## 2018-10-26 DIAGNOSIS — I10 Essential (primary) hypertension: Secondary | ICD-10-CM | POA: Insufficient documentation

## 2018-10-26 DIAGNOSIS — K222 Esophageal obstruction: Secondary | ICD-10-CM | POA: Insufficient documentation

## 2018-10-26 DIAGNOSIS — R1314 Dysphagia, pharyngoesophageal phase: Secondary | ICD-10-CM | POA: Diagnosis not present

## 2018-10-26 DIAGNOSIS — Z791 Long term (current) use of non-steroidal anti-inflammatories (NSAID): Secondary | ICD-10-CM | POA: Insufficient documentation

## 2018-10-26 DIAGNOSIS — Z79899 Other long term (current) drug therapy: Secondary | ICD-10-CM | POA: Diagnosis not present

## 2018-10-26 DIAGNOSIS — K210 Gastro-esophageal reflux disease with esophagitis: Secondary | ICD-10-CM | POA: Diagnosis not present

## 2018-10-26 DIAGNOSIS — Z885 Allergy status to narcotic agent status: Secondary | ICD-10-CM | POA: Diagnosis not present

## 2018-10-26 HISTORY — PX: MALONEY DILATION: SHX5535

## 2018-10-26 HISTORY — PX: ESOPHAGOGASTRODUODENOSCOPY: SHX5428

## 2018-10-26 SURGERY — EGD (ESOPHAGOGASTRODUODENOSCOPY)
Anesthesia: Moderate Sedation

## 2018-10-26 MED ORDER — SODIUM CHLORIDE 0.9 % IV SOLN
INTRAVENOUS | Status: DC
  Administered 2018-10-26: 09:00:00 via INTRAVENOUS

## 2018-10-26 MED ORDER — MIDAZOLAM HCL 5 MG/5ML IJ SOLN
INTRAMUSCULAR | Status: DC | PRN
  Administered 2018-10-26: 1 mg via INTRAVENOUS
  Administered 2018-10-26 (×4): 2 mg via INTRAVENOUS

## 2018-10-26 MED ORDER — MIDAZOLAM HCL 5 MG/5ML IJ SOLN
INTRAMUSCULAR | Status: AC
  Filled 2018-10-26: qty 10

## 2018-10-26 MED ORDER — LIDOCAINE VISCOUS HCL 2 % MT SOLN
OROMUCOSAL | Status: DC | PRN
  Administered 2018-10-26: 1 via OROMUCOSAL

## 2018-10-26 MED ORDER — MEPERIDINE HCL 100 MG/ML IJ SOLN
INTRAMUSCULAR | Status: DC | PRN
  Administered 2018-10-26: 15 mg via INTRAVENOUS
  Administered 2018-10-26: 25 mg
  Administered 2018-10-26: 10 mg via INTRAVENOUS

## 2018-10-26 MED ORDER — ONDANSETRON HCL 4 MG/2ML IJ SOLN
INTRAMUSCULAR | Status: DC | PRN
  Administered 2018-10-26: 4 mg via INTRAVENOUS

## 2018-10-26 MED ORDER — LIDOCAINE VISCOUS HCL 2 % MT SOLN
OROMUCOSAL | Status: AC
  Filled 2018-10-26: qty 15

## 2018-10-26 MED ORDER — ONDANSETRON HCL 4 MG/2ML IJ SOLN
INTRAMUSCULAR | Status: AC
  Filled 2018-10-26: qty 2

## 2018-10-26 MED ORDER — MEPERIDINE HCL 50 MG/ML IJ SOLN
INTRAMUSCULAR | Status: AC
  Filled 2018-10-26: qty 1

## 2018-10-26 MED ORDER — STERILE WATER FOR IRRIGATION IR SOLN
Status: DC | PRN
  Administered 2018-10-26: 1.5 mL

## 2018-10-26 NOTE — Discharge Instructions (Signed)
EGD Discharge instructions Please read the instructions outlined below and refer to this sheet in the next few weeks. These discharge instructions provide you with general information on caring for yourself after you leave the hospital. Your doctor may also give you specific instructions. While your treatment has been planned according to the most current medical practices available, unavoidable complications occasionally occur. If you have any problems or questions after discharge, please call your doctor. ACTIVITY  You may resume your regular activity but move at a slower pace for the next 24 hours.   Take frequent rest periods for the next 24 hours.   Walking will help expel (get rid of) the air and reduce the bloated feeling in your abdomen.   No driving for 24 hours (because of the anesthesia (medicine) used during the test).   You may shower.   Do not sign any important legal documents or operate any machinery for 24 hours (because of the anesthesia used during the test).  NUTRITION  Drink plenty of fluids.   You may resume your normal diet.   Begin with a light meal and progress to your normal diet.   Avoid alcoholic beverages for 24 hours or as instructed by your caregiver.  MEDICATIONS  You may resume your normal medications unless your caregiver tells you otherwise.  WHAT YOU CAN EXPECT TODAY  You may experience abdominal discomfort such as a feeling of fullness or gas pains.  FOLLOW-UP  Your doctor will discuss the results of your test with you.  SEEK IMMEDIATE MEDICAL ATTENTION IF ANY OF THE FOLLOWING OCCUR:  Excessive nausea (feeling sick to your stomach) and/or vomiting.   Severe abdominal pain and distention (swelling).   Trouble swallowing.   Temperature over 101 F (37.8 C).   Rectal bleeding or vomiting of blood.    GERD information provided  Stop Protonix for now  Begin Dexilant 60 mg daily.  Go by my office for a 3-week supply of Dexilant  samples before getting prescription filled.  Call in 3 weeks and let us know how you are doing on [place] visit with [place] in 3 weeks  At patient request I called Melburn Hake at [phone] to voicemail.     Gastroesophageal Reflux Disease, Adult Gastroesophageal reflux (GER) happens when acid from the stomach flows up into the tube that connects the mouth and the stomach (esophagus). Normally, food travels down the esophagus and stays in the stomach to be digested. With GER, food and stomach acid sometimes move back up into the esophagus. You may have a disease called gastroesophageal reflux disease (GERD) if the reflux:  Happens often.  Causes frequent or very bad symptoms.  Causes problems such as damage to the esophagus. When this happens, the esophagus becomes sore and swollen (inflamed). Over time, GERD can make small holes (ulcers) in the lining of the esophagus. What are the causes? This condition is caused by a problem with the muscle between the esophagus and the stomach. When this muscle is weak or not normal, it does not close properly to keep food and acid from coming back up from the stomach. The muscle can be weak because of:  Tobacco use.  Pregnancy.  Having a certain type of hernia (hiatal hernia).  Alcohol use.  Certain foods and drinks, such as coffee, chocolate, onions, and peppermint. What increases the risk? You are more likely to develop this condition if you:  Are overweight.  Have a disease that affects your connective tissue.  Use NSAID medicines. What are the signs or symptoms? Symptoms of this condition include:  Heartburn.  Difficult or painful swallowing.  The feeling of having a lump in the throat.  A bitter taste in the mouth.  Bad breath.  Having a lot of saliva.  Having an upset or bloated stomach.  Belching.  Chest pain. Different conditions can cause chest pain. Make sure you see your doctor if you have  chest pain.  Shortness of breath or noisy breathing (wheezing).  Ongoing (chronic) cough or a cough at night.  Wearing away of the surface of teeth (tooth enamel).  Weight loss. How is this treated? Treatment will depend on how bad your symptoms are. Your doctor may suggest:  Changes to your diet.  Medicine.  Surgery. Follow these instructions at home: Eating and drinking   Follow a diet as told by your doctor. You may need to avoid foods and drinks such as: ? Coffee and tea (with or without caffeine). ? Drinks that contain alcohol. ? Energy drinks and sports drinks. ? Bubbly (carbonated) drinks or sodas. ? Chocolate and cocoa. ? Peppermint and mint flavorings. ? Garlic and onions. ? Horseradish. ? Spicy and acidic foods. These include peppers, chili powder, curry powder, vinegar, hot sauces, and BBQ sauce. ? Citrus fruit juices and citrus fruits, such as oranges, lemons, and limes. ? Tomato-based foods. These include red sauce, chili, salsa, and pizza with red sauce. ? Fried and fatty foods. These include donuts, french fries, potato chips, and high-fat dressings. ? High-fat meats. These include hot dogs, rib eye steak, sausage, ham, and bacon. ? High-fat dairy items, such as whole milk, butter, and cream cheese.  Eat small meals often. Avoid eating large meals.  Avoid drinking large amounts of liquid with your meals.  Avoid eating meals during the 2-3 hours before bedtime.  Avoid lying down right after you eat.  Do not exercise right after you eat. Lifestyle   Do not use any products that contain nicotine or tobacco. These include cigarettes, e-cigarettes, and chewing tobacco. If you need help quitting, ask your doctor.  Try to lower your stress. If you need help doing this, ask your doctor.  If you are overweight, lose an amount of weight that is healthy for you. Ask your doctor about a safe weight loss goal. General instructions  Pay attention to any  changes in your symptoms.  Take over-the-counter and prescription medicines only as told by your doctor. Do not take aspirin, ibuprofen, or other NSAIDs unless your doctor says it is okay.  Wear loose clothes. Do not wear anything tight around your waist.  Raise (elevate) the head of your bed about 6 inches (15 cm).  Avoid bending over if this makes your symptoms worse.  Keep all follow-up visits as told by your doctor. This is important. Contact a doctor if:  You have new symptoms.  You lose weight and you do not know why.  You have trouble swallowing or it hurts to swallow.  You have wheezing or a cough that keeps happening.  Your symptoms do not get better with treatment.  You have a hoarse voice. Get help right away if:  You have pain in your arms, neck, jaw, teeth, or back.  You feel sweaty, dizzy, or light-headed.  You have chest pain or shortness of breath.  You throw up (vomit) and your throw-up looks like blood or coffee grounds.  You pass out (faint).  Your poop (stool) is bloody or black.  You cannot swallow, drink, or eat. Summary  If a person has gastroesophageal reflux disease (GERD), food and stomach acid move back up into the esophagus and cause symptoms or problems such as damage to the esophagus.  Treatment will depend on how bad your symptoms are.  Follow a diet as told by your doctor.  Take all medicines only as told by your doctor. This information is not intended to replace advice given to you by your health care provider. Make sure you discuss any questions you have with your health care provider. Document Released: 07/29/2007 Document Revised: 08/18/2017 Document Reviewed: 08/18/2017 Elsevier Patient Education  2020 [employer].

## 2018-10-26 NOTE — Op Note (Signed)
[facility] Patient Name: Christina FullingSusan Spencer Procedure Date: 10/26/2018 9:28 AM MRN: [id number] Date of Birth: 1960-10-29 Attending MD: Gennette Pacobert Michael Tayla Panozzo , MD CSN: [id number] Age: [age] Admit Type: Outpatient Procedure:                Upper GI endoscopy Indications:              Dysphagia Providers:                Gennette Pacobert Michael Saniyyah Elster, MD, Sterling Bigiffani Roberts, RN,                            Burke Keelsrisann Tilley, Technician Referring MD:              Medicines:                Midazolam 9 mg IV, Meperidine 50 mg IV, Ondansetron                            4 mg IV Complications:            No immediate complications. Estimated Blood Loss:     Estimated blood loss was minimal. Estimated blood                            loss: none. Procedure:                Pre-Anesthesia Assessment:                           - Prior to the procedure, a History and Physical                            was performed, and patient medications and                            allergies were reviewed. The patient's tolerance of                            previous anesthesia was also reviewed. The risks                            and benefits of the procedure and the sedation                            options and risks were discussed with the patient.                            All questions were answered, and informed consent                            was obtained. Prior Anticoagulants: The patient has                            taken no previous anticoagulant or antiplatelet                            agents.  ASA Grade Assessment: II - A patient with                            mild systemic disease. After reviewing the risks                            and benefits, the patient was deemed in                            satisfactory condition to undergo the procedure.                           After obtaining informed consent, the endoscope was                            passed under direct vision. Throughout the                   procedure, the patient's blood pressure, pulse, and                            oxygen saturations were monitored continuously. The                            GIF-[id number] ([id number] was introduced through the                            mouth, and advanced to the second part of duodenum.                            The upper GI endoscopy was accomplished without                            difficulty. The patient tolerated the procedure                            well. Scope In: 9:50:55 AM Scope Out: 9:57:27 AM Total Procedure Duration: 0 hours 6 minutes 32 seconds  Findings:      Esophagitis found - multiple erosions straddling the GE junction       superimposed on a noncritical appearing Schatzki's ring. No tumor. No       Barrett's epithelium seen.      A mild Schatzki ring was found at the gastroesophageal junction. The       scope was withdrawn. Dilation was performed with a Maloney dilator with       mild resistance at 56 Fr. The scope was withdrawn. Dilation was       performed with a Maloney dilator with mild resistance at 56 Fr. The       dilation site was examined following endoscope reinsertion and showed no       change. Estimated blood loss: none.      A small hiatal hernia was present.      The exam was otherwise without abnormality. Impression:               - Small hiatal hernia. Mild erosive reflux  esophagitis. Schatzki's ring?"dilated                           - The examination was otherwise normal.                           - No specimens collected. Moderate Sedation:      Moderate (conscious) sedation was administered by the endoscopy nurse       and supervised by the endoscopist. The following parameters were       monitored: oxygen saturation, heart rate, blood pressure, respiratory       rate, EKG, adequacy of pulmonary ventilation, and response to care.       Total physician intraservice time was 19 minutes. Recommendation:            - Patient has a contact number available for                            emergencies. The signs and symptoms of potential                            delayed complications were discussed with the                            patient. Return to normal activities tomorrow.                            Written discharge instructions were provided to the                            patient.                           - Resume previous diet.                           - Continue present medications. Stop Protonix trial                            of Dexilant 60 mg daily. Lifestyle modification                            recommended including weight loss                           - Return to my office in 3 months. Procedure Code(s):        --- Professional ---                           [phone], Esophagogastroduodenoscopy, flexible,                            transoral; diagnostic, including collection of                            specimen(s) by brushing or washing, when performed                            (  separate procedure)                           43450, Dilation of esophagus, by unguided sound or                            bougie, single or multiple passes                           G0500, Moderate sedation services provided by the                            same physician or other qualified health care                            professional performing a gastrointestinal                            endoscopic service that sedation supports,                            requiring the presence of an independent trained                            observer to assist in the monitoring of the                            patient's level of consciousness and physiological                            status; initial 15 minutes of intra-service time;                            patient age 15 years or older (additional time may                            be reported with [phone], as appropriate) Diagnosis  Code(s):        --- Professional ---                           K44.9, Diaphragmatic hernia without obstruction or                            gangrene                           R13.10, Dysphagia, unspecified CPT copyright 2019 American Medical Association. All rights reserved. The codes documented in this report are preliminary and upon coder review may  be revised to meet current compliance requirements. Gerrit Friends. Hoda Hon, MD Gennette Pac, MD 10/26/2018 10:17:28 AM This report has been signed electronically. Number of Addenda: 0

## 2018-10-26 NOTE — H&P (Signed)
 @LOGO @   Primary Care Physician:  Avis EpleyJackson, Samantha J, PA-C Primary Gastroenterologist:  Dr. Jena Gaussourk  Pre-Procedure History & Physical: HPI:  [name] is a 58 y.o. female here for recurrent esophageal dysphagia in the setting of a Schatzki's ring worsening reflux symptoms in spite of twice daily PPI in the setting of recent 20 pound weight gain.  Past Medical History:  Diagnosis Date  . Anxiety   . Arthritis   . GERD (gastroesophageal reflux disease)   . Hypertension   . S/P endoscopy    2008: noncritical Schatzki ring s/p dilation, normal esophagus and stomach, 2003: normal esophagus, s/p Maloney dilation, multiple antral erosions consistent with chronic gastritis, no H.pylori,     Past Surgical History:  Procedure Laterality Date  . CESAREAN SECTION    . CHOLECYSTECTOMY    . COLONOSCOPY  10/17/2010   normal   . ESOPHAGOGASTRODUODENOSCOPY  10/17/2010   non-critical Schatzki's ring s/p dilation, patulous EG junction, small hiatal hernia, otherwise normal stomach, first and second portion of duodenum  . FOOT SURGERY Right    heel spur  . HAND SURGERY Left    carpal tunnel  . KNEE ARTHROSCOPY WITH MEDIAL MENISECTOMY  01/07/2012   Procedure: KNEE ARTHROSCOPY WITH MEDIAL MENISECTOMY;  Surgeon: Darreld McleanWayne Keeling, MD;  Location: [facility];  Service: Orthopedics;  Laterality: Right;  . MALONEY DILATION  10/17/2010   Procedure: Elease HashimotoMALONEY DILATION;  Surgeon: Corbin Adeobert M Mohammed Mcandrew, MD;  Location: [facility];  Service: Endoscopy;  Laterality: N/A;  . TRIGGER FINGER RELEASE Right 08/20/2016   Procedure: RELEASE A-1 PULLEY RIGHT LONG FINGER;  Surgeon: Vickki HearingHarrison, Stanley E, MD;  Location: [facility];  Service: Orthopedics;  Laterality: Right;    Prior to Admission medications   Medication Sig Start Date End Date Taking? Authorizing Provider  Biotin w/ Vitamins C & E (HAIR/SKIN/NAILS PO) Take 2 tablets by mouth daily.    Yes [provider]  diclofenac (VOLTAREN) 75 MG EC tablet Take 75 mg by  mouth 2 (two) times daily.   Yes [provider]  hydrochlorothiazide (HYDRODIURIL) 25 MG tablet Take 25 mg by mouth daily.   Yes [provider]  naproxen sodium (ALEVE) 220 MG tablet Take 440 mg by mouth 2 (two) times daily as needed (pain.).   Yes [provider]  oxybutynin (DITROPAN XL) 15 MG 24 hr tablet Take 15 mg by mouth 2 (two) times daily.   Yes [provider]  pantoprazole (PROTONIX) 40 MG tablet Take 1 tablet (40 mg total) by mouth 2 (two) times daily before a meal. 08/19/18  Yes Gelene MinkBoone, Anna W, NP  vitamin B-12 (CYANOCOBALAMIN) 500 MCG tablet Take 1,000 mcg by mouth daily.   Yes [provider]  Camphor-Menthol-Methyl Sal (TIGER BALM MUSCLE RUB EX) Apply 1 application topically daily as needed (muscle pain).    [provider]  Phenylephrine-Acetaminophen (SUDAFED PE PRESSURE + PAIN PO) Take 2 tablets by mouth at bedtime as needed (sinus pain).    [provider]    Allergies as of 08/22/2018 - Review Complete 08/19/2018  Allergen Reaction Noted  . Acetaminophen-codeine Rash 05/06/2018    Family History  Problem Relation Age of Onset  . Cancer Father        esophagus ca, living  . Colon cancer Paternal Uncle        in 7860s, early 9070s  . Colon polyps Neg Hx     Social History   Socioeconomic History  . Marital status: Married  Spouse name: Not on file  . Number of children: Not on file  . Years of education: Not on file  . Highest education level: Not on file  Occupational History  . Occupation: Audiological scientist: [employer]  Social Needs  . Financial resource strain: Not on file  . Food insecurity    Worry: Not on file    Inability: Not on file  . Transportation needs    Medical: Not on file    Non-medical: Not on file  Tobacco Use  . Smoking status: Never Smoker  . Smokeless tobacco: Never Used  Substance and Sexual Activity  . Alcohol use: Yes    Comment: drinks gin on  weekends   . Drug use: No  . Sexual activity: Yes    Birth control/protection: Post-menopausal  Lifestyle  . Physical activity    Days per week: Not on file    Minutes per session: Not on file  . Stress: Not on file  Relationships  . Social Herbalist on phone: Not on file    Gets together: Not on file    Attends religious service: Not on file    Active member of club or organization: Not on file    Attends meetings of clubs or organizations: Not on file    Relationship status: Not on file  . Intimate partner violence    Fear of current or ex partner: Not on file    Emotionally abused: Not on file    Physically abused: Not on file    Forced sexual activity: Not on file  Other Topics Concern  . Not on file  Social History Narrative  . Not on file    Review of Systems: See HPI, otherwise negative ROS  Physical Exam: BP (!) 166/97   Pulse 79   Temp 98.5 F (36.9 C) (Oral)   Resp 14   Ht 5\' 1"  (1.549 m)   Wt 90.7 kg   SpO2 99%   BMI 37.79 kg/m  General:   Alert,  Well-developed, well-nourished, pleasant and cooperative in NAD Mouth:  No deformity or lesions. Neck:  Supple; no masses or thyromegaly. No significant cervical adenopathy. Lungs:  Clear throughout to auscultation.   No wheezes, crackles, or rhonchi. No acute distress. Heart:  Regular rate and rhythm; no murmurs, clicks, rubs,  or gallops. Abdomen: Non-distended, normal bowel sounds.  Soft and nontender without appreciable mass or hepatosplenomegaly.  Pulses:  Normal pulses noted. Extremities:  Without clubbing or edema.  Impression/Plan: 58 year old lady with a longstanding GERD and now recurrent esophageal dysphagia.  Here for further evaluation via EGD.  The risks, benefits, limitations, alternatives and imponderables have been reviewed with the patient. Potential for esophageal dilation, biopsy, etc. have also been reviewed.  Questions have been answered. All parties agreeable.     Notice:  This dictation was prepared with Dragon dictation along with smaller phrase technology. Any transcriptional errors that result from this process are unintentional and may not be corrected upon review.

## 2018-10-28 ENCOUNTER — Encounter (HOSPITAL_COMMUNITY): Payer: Self-pay | Admitting: Internal Medicine

## 2018-11-11 DIAGNOSIS — R0789 Other chest pain: Secondary | ICD-10-CM | POA: Diagnosis not present

## 2018-11-11 DIAGNOSIS — M549 Dorsalgia, unspecified: Secondary | ICD-10-CM | POA: Diagnosis not present

## 2018-11-11 DIAGNOSIS — S29011A Strain of muscle and tendon of front wall of thorax, initial encounter: Secondary | ICD-10-CM | POA: Diagnosis not present

## 2018-11-11 DIAGNOSIS — R8290 Unspecified abnormal findings in urine: Secondary | ICD-10-CM | POA: Diagnosis not present

## 2018-11-17 ENCOUNTER — Ambulatory Visit: Payer: BC Managed Care – PPO | Admitting: Gastroenterology

## 2018-11-30 ENCOUNTER — Other Ambulatory Visit: Payer: Self-pay | Admitting: *Deleted

## 2018-11-30 DIAGNOSIS — Z20822 Contact with and (suspected) exposure to covid-19: Secondary | ICD-10-CM

## 2018-11-30 DIAGNOSIS — Z20828 Contact with and (suspected) exposure to other viral communicable diseases: Secondary | ICD-10-CM | POA: Diagnosis not present

## 2018-12-02 ENCOUNTER — Telehealth: Payer: Self-pay | Admitting: Family Medicine

## 2018-12-02 LAB — NOVEL CORONAVIRUS, NAA: SARS-CoV-2, NAA: NOT DETECTED

## 2018-12-02 NOTE — Telephone Encounter (Signed)
Patient would like a copy of her Covid 19 results sent to her employer.   Employer: [employer] [phone] Fax number

## 2018-12-08 ENCOUNTER — Other Ambulatory Visit: Payer: Self-pay

## 2018-12-08 ENCOUNTER — Ambulatory Visit: Payer: BC Managed Care – PPO

## 2018-12-08 ENCOUNTER — Ambulatory Visit: Payer: BC Managed Care – PPO | Admitting: Orthopaedic Surgery

## 2018-12-08 ENCOUNTER — Encounter: Payer: Self-pay | Admitting: Orthopaedic Surgery

## 2018-12-08 VITALS — BP 174/74 | HR 82 | Ht 61.0 in | Wt 204.0 lb

## 2018-12-08 DIAGNOSIS — G8929 Other chronic pain: Secondary | ICD-10-CM

## 2018-12-08 DIAGNOSIS — M25561 Pain in right knee: Secondary | ICD-10-CM | POA: Diagnosis not present

## 2018-12-08 NOTE — Progress Notes (Signed)
Patient [name], female DOB:05-24-1960, 58 y.o. GYI:[id number]  Chief Complaint  Patient presents with  . Knee Pain    right     HPI  [name] is a 58 y.o. female who has increasing pain of the right knee.  She has popping and swelling and bow legged appearance now.  She is getting worse.  Nothing helps.  She has no new trauma.   Body mass index is 38.55 kg/m.  ROS  Review of Systems  HENT: Negative for congestion.   Respiratory: Negative for cough and shortness of breath.   Cardiovascular: Negative for chest pain and leg swelling.       Hypertension   Gastrointestinal:       GERD and reflux  Endocrine: Positive for cold intolerance.  Musculoskeletal: Positive for arthralgias and joint swelling (right knee).  Allergic/Immunologic: Positive for environmental allergies.  Psychiatric/Behavioral: The patient is nervous/anxious.   All other systems reviewed and are negative.   All other systems reviewed and are negative.  The following is a summary of the past history medically, past history surgically, known current medicines, social history and family history.  This information is gathered electronically by the computer from prior information and documentation.  I review this each visit and have found including this information at this point in the chart is beneficial and informative.    Past Medical History:  Diagnosis Date  . Anxiety   . Arthritis   . GERD (gastroesophageal reflux disease)   . Hypertension   . S/P endoscopy    2008: noncritical Schatzki ring s/p dilation, normal esophagus and stomach, 2003: normal esophagus, s/p Maloney dilation, multiple antral erosions consistent with chronic gastritis, no H.pylori,     Past Surgical History:  Procedure Laterality Date  . CESAREAN SECTION    . CHOLECYSTECTOMY    . COLONOSCOPY  10/17/2010   normal   . ESOPHAGOGASTRODUODENOSCOPY  10/17/2010   non-critical Schatzki's ring s/p dilation, patulous EG  junction, small hiatal hernia, otherwise normal stomach, first and second portion of duodenum  . ESOPHAGOGASTRODUODENOSCOPY N/A 10/26/2018   Procedure: ESOPHAGOGASTRODUODENOSCOPY (EGD);  Surgeon: Corbin Ade, MD;  [facility]: [facility];  Service: Endoscopy;  Laterality: N/A;  9:15am  . FOOT SURGERY Right    heel spur  . HAND SURGERY Left    carpal tunnel  . KNEE ARTHROSCOPY WITH MEDIAL MENISECTOMY  01/07/2012   Procedure: KNEE ARTHROSCOPY WITH MEDIAL MENISECTOMY;  Surgeon: Darreld Mclean, MD;  Location: [facility];  Service: Orthopedics;  Laterality: Right;  . MALONEY DILATION  10/17/2010   Procedure: [name] DILATION;  Surgeon: Corbin Ade, MD;  [facility];  Service: Endoscopy;  Laterality: N/A;  . [name] DILATION N/A 10/26/2018   Procedure: [name] DILATION;  Surgeon: Corbin Ade, MD;  [facility];  Service: Endoscopy;  Laterality: N/A;  . TRIGGER FINGER RELEASE Right 08/20/2016   Procedure: RELEASE A-1 PULLEY RIGHT LONG FINGER;  Surgeon: Vickki Hearing, MD;  Location: [facility];  Service: Orthopedics;  Laterality: Right;    Family History  Problem Relation Age of Onset  . Cancer Father        esophagus ca, living  . Colon cancer Paternal Uncle        in 42s, early 67s  . Colon polyps Neg Hx     Social History Social History   Tobacco Use  . Smoking status: Never Smoker  . Smokeless tobacco: Never Used  Substance Use Topics  . Alcohol  use: Yes    Comment: drinks gin on weekends   . Drug use: No    Allergies  Allergen Reactions  . Acetaminophen-Codeine Rash    Tylenol with Codeine Tylenol #3,    Current Outpatient Medications  Medication Sig Dispense Refill  . Biotin w/ Vitamins C & E (HAIR/SKIN/NAILS PO) Take 2 tablets by mouth daily.     . Camphor-Menthol-Methyl Sal (TIGER BALM MUSCLE RUB EX) Apply 1 application topically daily as needed (muscle pain).    Marland Kitchen diclofenac (VOLTAREN) 75 MG EC tablet Take 75 mg by mouth 2 (two) times daily.     . hydrochlorothiazide (HYDRODIURIL) 25 MG tablet Take 25 mg by mouth daily.    . naproxen sodium (ALEVE) 220 MG tablet Take 440 mg by mouth 2 (two) times daily as needed (pain.).    Marland Kitchen oxybutynin (DITROPAN XL) 15 MG 24 hr tablet Take 15 mg by mouth 2 (two) times daily.    . pantoprazole (PROTONIX) 40 MG tablet Take 1 tablet (40 mg total) by mouth 2 (two) times daily before a meal. 180 tablet 3  . Phenylephrine-Acetaminophen (SUDAFED PE PRESSURE + PAIN PO) Take 2 tablets by mouth at bedtime as needed (sinus pain).    . vitamin B-12 (CYANOCOBALAMIN) 500 MCG tablet Take 1,000 mcg by mouth daily.     No current facility-administered medications for this visit.      Physical Exam  Blood pressure (!) 174/74, pulse 82, height 5\' 1"  (1.549 m), weight 204 lb (92.5 kg).  Constitutional: overall normal hygiene, normal nutrition, well developed, normal grooming, normal body habitus. Assistive device:none  Musculoskeletal: gait and station Limp right, muscle tone and strength are normal, no tremors or atrophy is present.  Bow legged appearance knees. .  Neurological: coordination overall normal.  Deep tendon reflex/nerve stretch intact.  Sensation normal.  Cranial nerves II-XII intact.   Skin:   Normal overall no scars, lesions, ulcers or rashes. No psoriasis.  Psychiatric: Alert and oriented x 3.  Recent memory intact, remote memory unclear.  Normal mood and affect. Well groomed.  Good eye contact.  Cardiovascular: overall no swelling, no varicosities, no edema bilaterally, normal temperatures of the legs and arms, no clubbing, cyanosis and good capillary refill.  Lymphatic: palpation is normal.  All other systems reviewed and are negative   The patient has been educated about the nature of the problem(s) and counseled on treatment options.  The patient appeared to understand what I have discussed and is in agreement with it.  Encounter Diagnosis  Name Primary?  . Chronic pain of right knee  Yes   X-rays were done of the right knee, reported separately.  PROCEDURE NOTE:  The patient requests injections of the right knee , verbal consent was obtained.  The right knee was prepped appropriately after time out was performed.   Sterile technique was observed and injection of 1 cc of Depo-Medrol 40 mg with several cc's of plain xylocaine. Anesthesia was provided by ethyl chloride and a 20-gauge needle was used to inject the knee area. The injection was tolerated well.  A band aid dressing was applied.  The patient was advised to apply ice later today and tomorrow to the injection sight as needed.  PLAN Call if any problems.  Precautions discussed.  Continue current medications.   Return to clinic 2 weeks   Get MRI of the right knee.  Electronically Signed Sanjuana Kava, MD 10/15/202011:31 AM

## 2018-12-20 ENCOUNTER — Telehealth: Payer: Self-pay | Admitting: Orthopaedic Surgery

## 2018-12-20 NOTE — Telephone Encounter (Signed)
So I called the plan, [employer] [place] and no authorization per Annesha N. Reference number for call is her name patients ID number and todays date (12/12/2018)  As noted in the MRI  I do not have her phone number, if you find it, will you let her know?

## 2018-12-20 NOTE — Telephone Encounter (Signed)
Myra from the [facility] left message that this patient's MRI needs authorization.

## 2018-12-21 ENCOUNTER — Other Ambulatory Visit: Payer: Self-pay

## 2018-12-21 ENCOUNTER — Ambulatory Visit (HOSPITAL_COMMUNITY)
Admission: RE | Admit: 2018-12-21 | Discharge: 2018-12-21 | Disposition: A | Discharge status: Home / Self Care | Payer: BC Managed Care – PPO | Source: Ambulatory Visit | Attending: Orthopaedic Surgery | Admitting: Orthopaedic Surgery

## 2018-12-21 DIAGNOSIS — M25561 Pain in right knee: Secondary | ICD-10-CM | POA: Diagnosis not present

## 2018-12-21 DIAGNOSIS — G8929 Other chronic pain: Secondary | ICD-10-CM | POA: Insufficient documentation

## 2018-12-22 ENCOUNTER — Ambulatory Visit: Payer: BC Managed Care – PPO | Admitting: Orthopaedic Surgery

## 2018-12-27 ENCOUNTER — Ambulatory Visit: Payer: BC Managed Care – PPO | Admitting: Orthopaedic Surgery

## 2019-01-10 ENCOUNTER — Other Ambulatory Visit: Payer: Self-pay

## 2019-01-10 ENCOUNTER — Encounter: Payer: Self-pay | Admitting: Orthopaedic Surgery

## 2019-01-10 ENCOUNTER — Ambulatory Visit (INDEPENDENT_AMBULATORY_CARE_PROVIDER_SITE_OTHER): Payer: BC Managed Care – PPO | Admitting: Orthopaedic Surgery

## 2019-01-10 VITALS — Temp 97.9°F | Ht 61.0 in | Wt 202.0 lb

## 2019-01-10 DIAGNOSIS — G8929 Other chronic pain: Secondary | ICD-10-CM | POA: Diagnosis not present

## 2019-01-10 DIAGNOSIS — M25561 Pain in right knee: Secondary | ICD-10-CM | POA: Diagnosis not present

## 2019-01-10 MED ORDER — HYDROCODONE-ACETAMINOPHEN 5-325 MG PO TABS: 1.0000 | ORAL_TABLET | ORAL | 0 refills | Status: AC | PRN

## 2019-01-10 NOTE — Progress Notes (Signed)
Patient [name], female DOB:Jan 21, 1961, 58 y.o. EXH:[id number]  Chief Complaint  Patient presents with  . Knee Pain    Rt knee pain    HPI  [name] is a 58 y.o. female who has continued pain of the right knee and pain with walking. She had MRI which showed:  IMPRESSION: 1. Diminutive, if not absent, medial meniscus posterior horn likely reflecting prior meniscectomy and recurrent tear. Macerated body. 2. Small radial and horizontal tear of the lateral meniscus body. 3. Chronically torn ACL.  Partial tear of the proximal PCL. 4. Progressive tricompartmental osteoarthritis, severe in the medial compartment. Several new loose bodies measuring up to 1.4 cm.  I have explained the findings to her.  I will have Dr. Aline Brochure see her and examine her.  She is agreeable.    Body mass index is 38.17 kg/m.  ROS  Review of Systems  HENT: Negative for congestion.   Respiratory: Negative for cough and shortness of breath.   Cardiovascular: Negative for chest pain and leg swelling.       Hypertension   Gastrointestinal:       GERD and reflux  Endocrine: Positive for cold intolerance.  Musculoskeletal: Positive for arthralgias and joint swelling (right knee).  Allergic/Immunologic: Positive for environmental allergies.  Psychiatric/Behavioral: The patient is nervous/anxious.   All other systems reviewed and are negative.   All other systems reviewed and are negative.  The following is a summary of the past history medically, past history surgically, known current medicines, social history and family history.  This information is gathered electronically by the computer from prior information and documentation.  I review this each visit and have found including this information at this point in the chart is beneficial and informative.    Past Medical History:  Diagnosis Date  . Anxiety   . Arthritis   . GERD (gastroesophageal reflux disease)   . Hypertension   . S/P  endoscopy    2008: noncritical Schatzki ring s/p dilation, normal esophagus and stomach, 2003: normal esophagus, s/p Maloney dilation, multiple antral erosions consistent with chronic gastritis, no H.pylori,     Past Surgical History:  Procedure Laterality Date  . CESAREAN SECTION    . CHOLECYSTECTOMY    . COLONOSCOPY  10/17/2010   normal   . ESOPHAGOGASTRODUODENOSCOPY  10/17/2010   non-critical Schatzki's ring s/p dilation, patulous EG junction, small hiatal hernia, otherwise normal stomach, first and second portion of duodenum  . ESOPHAGOGASTRODUODENOSCOPY N/A 10/26/2018   Procedure: ESOPHAGOGASTRODUODENOSCOPY (EGD);  Surgeon: Daneil Dolin, MD;  [facility];  Service: Endoscopy;  Laterality: N/A;  9:15am  . FOOT SURGERY Right    heel spur  . HAND SURGERY Left    carpal tunnel  . KNEE ARTHROSCOPY WITH MEDIAL MENISECTOMY  01/07/2012   Procedure: KNEE ARTHROSCOPY WITH MEDIAL MENISECTOMY;  Surgeon: Sanjuana Kava, MD;  Location: [facility];  Service: Orthopedics;  Laterality: Right;  . MALONEY DILATION  10/17/2010   Procedure: Venia Minks DILATION;  Surgeon: Daneil Dolin, MD;  [facility];  Service: Endoscopy;  Laterality: N/A;  . Venia Minks DILATION N/A 10/26/2018   Procedure: Venia Minks DILATION;  Surgeon: Daneil Dolin, MD;  [facility];  Service: Endoscopy;  Laterality: N/A;  . TRIGGER FINGER RELEASE Right 08/20/2016   Procedure: RELEASE A-1 PULLEY RIGHT LONG FINGER;  Surgeon: Carole Civil, MD;  Location: [facility];  Service: Orthopedics;  Laterality: Right;    Family History  Problem Relation Age of  Onset  . Cancer Father        esophagus ca, living  . Colon cancer Paternal Uncle        in 23s, early 4s  . Colon polyps Neg Hx     Social History Social History   Tobacco Use  . Smoking status: Never Smoker  . Smokeless tobacco: Never Used  Substance Use Topics  . Alcohol use: Yes    Comment: drinks gin on weekends   . Drug use: No     Allergies  Allergen Reactions  . Acetaminophen-Codeine Rash    Tylenol with Codeine Tylenol #3,    Current Outpatient Medications  Medication Sig Dispense Refill  . Biotin w/ Vitamins C & E (HAIR/SKIN/NAILS PO) Take 2 tablets by mouth daily.     . Camphor-Menthol-Methyl Sal (TIGER BALM MUSCLE RUB EX) Apply 1 application topically daily as needed (muscle pain).    Marland Kitchen diclofenac (VOLTAREN) 75 MG EC tablet Take 75 mg by mouth 2 (two) times daily.    . hydrochlorothiazide (HYDRODIURIL) 25 MG tablet Take 25 mg by mouth daily.    Marland Kitchen HYDROcodone-acetaminophen (NORCO/VICODIN) 5-325 MG tablet Take 1 tablet by mouth every 4 (four) hours as needed for up to 5 days for moderate pain. 30 tablet 0  . naproxen sodium (ALEVE) 220 MG tablet Take 440 mg by mouth 2 (two) times daily as needed (pain.).    Marland Kitchen oxybutynin (DITROPAN XL) 15 MG 24 hr tablet Take 15 mg by mouth 2 (two) times daily.    . pantoprazole (PROTONIX) 40 MG tablet Take 1 tablet (40 mg total) by mouth 2 (two) times daily before a meal. 180 tablet 3  . Phenylephrine-Acetaminophen (SUDAFED PE PRESSURE + PAIN PO) Take 2 tablets by mouth at bedtime as needed (sinus pain).    . vitamin B-12 (CYANOCOBALAMIN) 500 MCG tablet Take 1,000 mcg by mouth daily.     No current facility-administered medications for this visit.      Physical Exam  Temperature 97.9 F (36.6 C), height 5\' 1"  (1.549 m), weight 202 lb (91.6 kg).  Constitutional: overall normal hygiene, normal nutrition, well developed, normal grooming, normal body habitus. Assistive device:none  Musculoskeletal: gait and station Limp right, muscle tone and strength are normal, no tremors or atrophy is present.  .  Neurological: coordination overall normal.  Deep tendon reflex/nerve stretch intact.  Sensation normal.  Cranial nerves II-XII intact.   Skin:   Normal overall no scars, lesions, ulcers or rashes. No psoriasis.  Psychiatric: Alert and oriented x 3.  Recent memory intact,  remote memory unclear.  Normal mood and affect. Well groomed.  Good eye contact.  Cardiovascular: overall no swelling, no varicosities, no edema bilaterally, normal temperatures of the legs and arms, no clubbing, cyanosis and good capillary refill.  Right knee is tender, has effusion, crepitus,more medial pain, positive medial McMurray and drawer sign.  NV intact.  Limp right.  Lymphatic: palpation is normal.  All other systems reviewed and are negative   The patient has been educated about the nature of the problem(s) and counseled on treatment options.  The patient appeared to understand what I have discussed and is in agreement with it.  Encounter Diagnosis  Name Primary?  . Chronic pain of right knee Yes    PLAN Call if any problems.  Precautions discussed.  Continue current medications.   Return to clinic to see Dr.   I have reviewed the [facility] Controlled Substance Reporting System web site prior to  prescribing narcotic medicine for this patient.   Electronically Signed Darreld McleanWayne Rhegan Trunnell, MD 11/17/202011:09 AM

## 2019-01-24 ENCOUNTER — Other Ambulatory Visit: Payer: Self-pay

## 2019-01-24 ENCOUNTER — Encounter: Payer: Self-pay | Admitting: Orthopedic Surgery

## 2019-01-24 ENCOUNTER — Ambulatory Visit (INDEPENDENT_AMBULATORY_CARE_PROVIDER_SITE_OTHER): Payer: BC Managed Care – PPO | Admitting: Orthopedic Surgery

## 2019-01-24 VITALS — HR 97 | Ht 61.0 in | Wt 202.0 lb

## 2019-01-24 DIAGNOSIS — M1711 Unilateral primary osteoarthritis, right knee: Secondary | ICD-10-CM

## 2019-01-24 MED ORDER — HYDROCODONE-ACETAMINOPHEN 5-325 MG PO TABS: 1.0000 | ORAL_TABLET | Freq: Four times a day (QID) | ORAL | 0 refills | Status: DC | PRN

## 2019-01-24 NOTE — Progress Notes (Signed)
PREOP CONSULT/REFERRAL INTRA-OFFICE FROM DR Tyrone Apple   Chief Complaint  Patient presents with  . Knee Pain    surgical consult right knee     58 year old female presents as a consultation into her office from Dr. Luna Glasgow complains of severe pain right knee.  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  She had a knee scope 10 years ago she has had pain in the knee for approximately 10 years with severe pain requiring oral analgesics using hydrocodone.  Her ADLs are difficult for her such as getting in and out of a car getting out of a chair walking and even working.  Her BMI is 38.  She also complains of limited motion unable to bend the knee past 90 degrees.  She has a husband and 3 daughters.  She has steps into the house x4 no steps in the house.  She is agreeable to the 23-hour protocol discharge   Review of Systems  Gastrointestinal: Positive for heartburn.  Musculoskeletal: Positive for joint pain.  All other systems reviewed and are negative.    Past Medical History:  Diagnosis Date  . Anxiety   . Arthritis   . GERD (gastroesophageal reflux disease)   . Hypertension   . S/P endoscopy    2008: noncritical Schatzki ring s/p dilation, normal esophagus and stomach, 2003: normal esophagus, s/p Maloney dilation, multiple antral erosions consistent with chronic gastritis, no H.pylori,     Past Surgical History:  Procedure Laterality Date  . CESAREAN SECTION    . CHOLECYSTECTOMY    . COLONOSCOPY  10/17/2010   normal   . ESOPHAGOGASTRODUODENOSCOPY  10/17/2010   non-critical Schatzki's ring s/p dilation, patulous EG junction, small hiatal hernia, otherwise normal stomach, first and second portion of duodenum  . ESOPHAGOGASTRODUODENOSCOPY N/A 10/26/2018   Procedure: ESOPHAGOGASTRODUODENOSCOPY (EGD);  Surgeon: Daneil Dolin, MD;  [facility]: [facility];  Service: Endoscopy;  Laterality: N/A;  9:15am  . FOOT SURGERY  Right    heel spur  . HAND SURGERY Left    carpal tunnel  . KNEE ARTHROSCOPY WITH MEDIAL MENISECTOMY  01/07/2012   Procedure: KNEE ARTHROSCOPY WITH MEDIAL MENISECTOMY;  Surgeon: Sanjuana Kava, MD;  Location: [facility];  Service: Orthopedics;  Laterality: Right;  . MALONEY DILATION  10/17/2010   Procedure: Venia Minks DILATION;  Surgeon: Daneil Dolin, MD;  Location: [facility];  Service: Endoscopy;  Laterality: N/A;  . Venia Minks DILATION N/A 10/26/2018   Procedure: Venia Minks DILATION;  Surgeon: Daneil Dolin, MD;  Location: [facility];  Service: Endoscopy;  Laterality: N/A;  . TRIGGER FINGER RELEASE Right 08/20/2016   Procedure: RELEASE A-1 PULLEY RIGHT LONG FINGER;  Surgeon: Carole Civil, MD;  Location: [facility];  Service: Orthopedics;  Laterality: Right;    Family History  Problem Relation Age of Onset  . Cancer Father        esophagus ca, living  . Colon cancer Paternal Uncle        in 11s, early 19s  . Colon polyps Neg Hx    Social History   Tobacco Use  . Smoking status: Never Smoker  . Smokeless tobacco: Never Used  Substance Use Topics  . Alcohol use: Yes    Comment: drinks gin on weekends   . Drug use: No    Allergies  Allergen Reactions  . Acetaminophen-Codeine Rash    Tylenol with Codeine Tylenol #3,     Current Meds  Medication Sig  . Biotin w/ Vitamins C & E (HAIR/SKIN/NAILS PO) Take 2 tablets by mouth daily.   . Camphor-Menthol-Methyl Sal (TIGER BALM MUSCLE RUB EX) Apply 1 application topically daily as needed (muscle pain).  Marland Kitchen diclofenac (VOLTAREN) 75 MG EC tablet Take 75 mg by mouth 2 (two) times daily.  . hydrochlorothiazide (HYDRODIURIL) 25 MG tablet Take 25 mg by mouth daily.  . naproxen sodium (ALEVE) 220 MG tablet Take 440 mg by mouth 2 (two) times daily as needed (pain.).  Marland Kitchen oxybutynin (DITROPAN XL) 15 MG 24 hr tablet Take 15 mg by mouth 2 (two) times daily.  . pantoprazole (PROTONIX) 40 MG tablet Take 1 tablet (40 mg total) by mouth 2 (two) times  daily before a meal.  . Phenylephrine-Acetaminophen (SUDAFED PE PRESSURE + PAIN PO) Take 2 tablets by mouth at bedtime as needed (sinus pain).  . vitamin B-12 (CYANOCOBALAMIN) 500 MCG tablet Take 1,000 mcg by mouth daily.    Pulse 97   Ht 5\' 1"  (1.549 m)   Wt 202 lb (91.6 kg)   BMI 38.17 kg/m   Physical Exam General appearance is normal her body habitus is mesomorphic  Oriented x3  Mood affect normal   Ortho Exam   Gait varus thrust with limping favoring the right knee  Right knee is tender on the medial side she has full extension but 90 degrees of flexion.  Ligaments are stable in all planes.  Muscle tone and strength are normal.  Skin is clean without erythema.  Distal pulses are intact there is no edema.  Sensation is normal.  Balance and coordination are normal.    MEDICAL DECISION SECTION  xrays ordered?  X-rays previously taken show a varus knee with severe disease and joint space narrowing osteophytes the varus deformity itself is moderate  My independent reading of xrays: As noted   Encounter Diagnosis  Name Primary?  . Primary osteoarthritis of right knee Yes     PLAN:   Surgical procedure planned: Right total knee arthroplasty 23-hour protocol patient aware and agrees  The procedure has been fully reviewed with the patient; The risks and benefits of surgery have been discussed and explained and understood. Alternative treatment has also been reviewed, questions were encouraged and answered. The postoperative plan is also been reviewed.  Nonsurgical treatment as described in the history and physical section was attempted and unsuccessful and the patient has agreed to proceed with surgical intervention to improve their situation.  Meds ordered this encounter  Medications  . HYDROcodone-acetaminophen (NORCO/VICODIN) 5-325 MG tablet    Sig: Take 1 tablet by mouth every 6 (six) hours as needed for moderate pain.    Dispense:  30 tablet    Refill:  0     , MD 01/24/2019 3:16 PM

## 2019-01-24 NOTE — Patient Instructions (Addendum)
You have decided to proceed with knee replacement surgery. You have decided not to continue with nonoperative measures such as but not limited to oral medication, weight loss, activity modification, physical therapy, bracing, or injection.  We will perform the procedure commonly known as total knee replacement. Some of the risks associated with knee replacement surgery include but are not limited to Bleeding Infection Swelling Stiffness Blood clot Pulmonary embolism  Loosening of the implant Pain that persists even after surgery  Infection is especially devastating complication of knee surgery although rare. If infection does occur your implant will usually have to be removed and several surgeries and antibiotics will be needed to eradicate the infection prior to performing a repeat replacement.   In some cases amputation is required to eradicate the infection. In other rare cases a knee fusion is needed   In compliance with recent [place] law in federal regulation regarding opioid use and abuse and addiction, we will taper (stop) opioid medication after 2 weeks.  If you're not comfortable with these risks and would like to continue with nonoperative treatment please let Dr. Romeo Apple know prior to your surgery.  Total Knee Replacement  Total knee replacement is a procedure to replace the damaged knee joint with an artificial (prosthetic) knee joint. The purpose of this surgery is to reduce knee pain and improve knee function. The prosthetic knee joint (prosthesis) may be made of metal, plastic, or ceramic. It replaces parts of the thigh bone (femur), lower leg bone (tibia), and kneecap (patella) that are removed during the procedure. Tell a health care provider about:  Any allergies you have.  All medicines you are taking, including vitamins, herbs, eye drops, creams, and over-the-counter medicines.  Any problems you or family members have had with anesthetic medicines.  Any  blood disorders you have.  Any surgeries you have had.  Any medical conditions you have.  Whether you are pregnant or may be pregnant. What are the risks? Generally, this is a safe procedure. However, problems may occur, including:  Infection.  Bleeding.  Blood clot.  Allergic reactions to medicines.  Damage to nerves or other structures.  Decreased range of motion of the knee.  Instability of the knee.  Loosening of the prosthetic joint.  Knee pain that does not go away (chronic pain). What happens before the procedure? Staying hydrated Follow instructions from your health care provider about hydration, which may include:  Up to 2 hours before the procedure - you may continue to drink clear liquids, such as water, clear fruit juice, black coffee, and plain tea.  Eating and drinking restrictions Follow instructions from your health care provider about eating and drinking, which may include:  8 hours before the procedure - stop eating heavy meals or foods, such as meat, fried foods, or fatty foods.  6 hours before the procedure - stop eating light meals or foods, such as toast or cereal.  6 hours before the procedure - stop drinking milk or drinks that contain milk.  2 hours before the procedure - stop drinking clear liquids. Medicines Ask your health care provider about:  Changing or stopping your regular medicines. This is especially important if you are taking diabetes medicines or blood thinners.  Taking medicines such as aspirin and ibuprofen. These medicines can thin your blood. Do not take these medicines unless your health care provider tells you to take them.  Taking over-the-counter medicines, vitamins, herbs, and supplements. Tests and exams  You may have a physical exam.  You may have tests, such as: ? X-rays. ? MRI. ? CT scan. ? Bone scans.  You may have a blood or urine sample taken. Lifestyle   If your health care provider prescribes  physical therapy, do exercises as instructed.  Maintain good body and oral hygiene. Germs from anywhere in your body can travel to your new joint and infect it. Tell your health care provider if you: ? Plan to have dental care and routine cleanings. ? Develop any skin infections.  If you are overweight, work with your health care provider to reach a safe weight. Extra weight can put pressure on your knee.  Do not use any products that contain nicotine or tobacco, such as cigarettes, e-cigarettes, and chewing tobacco. These can delay healing after surgery. If you need help quitting, ask your health care provider. General instructions  Plan to have someone take you home from the hospital or clinic.  Plan to have a responsible adult care for you for at least 24 hours after you leave the hospital or clinic. This is important. It is recommended that you have someone to help care for you for at least 4-6 weeks after your procedure.  Do not shave your legs just before surgery. If hair removal is needed, it will be done in the hospital.  Ask your health care provider how your surgical site will be marked or identified.  Ask your health care provider what steps will be taken to prevent infection. These may include: ? Removing hair at the surgery site. ? Washing skin with a germ-killing soap. What happens during the procedure?  An IV will be inserted into one of your veins.  You will be given one or more of the following: ? A medicine to help you relax (sedative). ? A medicine that is injected into an area of your body near the nerves to numb everything below the injection site (peripheral nerve block). ? A medicine that is injected into your spine to numb the area below and slightly above the injection site (spinal anesthetic). ? A medicine to make you fall asleep (general anesthetic).  An incision will be made in your knee.  Damaged cartilage and bone will be removed from your femur, tibia,  and patella.  Parts of the prosthesis (liners) will be placed over the areas of bone and cartilage that were removed. A metal liner will be placed over your femur, and plastic liners will be placed over your tibia and the underside of your patella.  One or more small tubes (drains) may be placed near your incision to help drain extra fluid from your surgery site.  Your incision will be closed with stitches (sutures), skin glue, or adhesive strips.  A bandage (dressing) will be placed over your incision. The procedure may vary among health care providers and hospitals. What happens after the procedure?  Your blood pressure, heart rate, breathing rate, and blood oxygen level will be monitored until you leave the hospital or clinic.  You will be given medicines to help manage pain.  You may: ? Continue to receive fluids through an IV. ? Have fluid coming from one or more drains in your incision. ? Have to wear compression stockings. These stockings help to prevent blood clots and reduce swelling in your legs. ? Be given a continuous passive motion machine to use. You will be shown how to use this machine.  You will be encouraged to move. A physical therapist will teach you how to use crutches  or a walker. He or she will also teach you how to exercise at home.  Do not drive until your health care provider approves. Summary  Total knee replacement is a procedure to replace the knee joint with an artificial (prosthetic) knee joint.  Before the procedure, follow instructions from your health care provider about eating and drinking.  Plan to have someone take you home from the hospital or clinic. This information is not intended to replace advice given to you by your health care provider. Make sure you discuss any questions you have with your health care provider. Document Released: 05/18/2000 Document Revised: 09/23/2017 Document Reviewed: 09/23/2017 Elsevier Patient Education  2020  [place].  Preparing for Knee Replacement Getting prepared before knee replacement surgery can make your recovery easier and more comfortable. This document provides some tips and guidelines that will help you prepare for your surgery. Talk with your health care provider so you can learn what to expect before, during, and after surgery. Ask questions if you do not understand something. To ease concerns about your financial responsibilities, call your insurance company as soon as you decide to have surgery. Ask how much of your surgery and hospital stay will be covered. Also ask about coverage for medical equipment, rehabilitation facilities, and home care. How should I arrange for help? In the first couple weeks after surgery, it will likely be harder for you to do some of your regular activities. You may get tired easily, and you will have limited movement in your leg. Follow these guidelines to make sure you have all the help you need after your surgery:  Plan to have someone take you home from the hospital. Your health care provider will tell you how many days you can expect to be in the hospital.  Cancel all your work, caregiving, and volunteer responsibilities for at least 4-6 weeks after your surgery.  Plan to have someone stay with you day and night for the first week. This person should be someone you are comfortable with. You may need this person to help you with your exercises and personal care, such as bathing and using the toilet.  If you live alone, arrange for someone to take care of your home and pets for the first 4-6 weeks after surgery.  Arrange for drivers to take you to and from follow-up appointments, the grocery store, and other places you may need to go for at least 4-6 weeks.  Consider applying for a disabled parking permit. To get an application, contact the Department of Motor Vehicles or your health care provider's office. How should I prepare my home?       Pick a recovery spot, but do not plan on recovering in bed. Sitting upright is better for your health. You may want to use a recliner with a small table nearby. Place the items you use most frequently on that table. These may include the TV remote, a cordless phone, a cell phone, a book or laptop computer, and a water glass.  To see if you will be able to move around in your home with a walker, hold your hands out about 6 inches (15 cm) from your sides, and walk from your recovery spot to your kitchen and bathroom. Then walk from your bed to the bathroom. If you do not hit anything with your hands, you will have enough room for a walker.  Minimize the use of stairs after you return home to reduce your risk of falling or  tripping.  Remove all clutter from your floors. Also remove any throw rugs. This will help you avoid tripping after your surgery.  Move the items you use most often in your kitchen, bathroom, and bedroom to shelves and drawers that are at countertop height.  Prepare a few meals to freeze and reheat later.  Consider getting safety equipment that will be helpful during your recovery, such as: ? Grab bars added in the shower and near the toilet. ? A raised toilet seat to help you get on and off the toilet more easily. ? A tub or shower bench. How should I prepare my body?  Have a preoperative exam. ? During the exam, your health care provider will make sure that your body is healthy enough to safely have the surgery. ? When you go to the exam, bring a complete list of all your medicines and supplements, including herbs and vitamins. ? You may need to have additional tests to ensure your safety.  Have elective dental care and routine cleanings done before your surgery. Germs from anywhere in your body, including your mouth, can travel to your new joint and infect it. It is important that you do not have any dental work done for at least 3 months after your  surgery.  Maintain a healthy diet. Do not change your diet before surgery unless your health care provider tells you to do that.  Do not use any products that contain nicotine or tobacco, such as cigarettes and e-cigarettes. These can delay bone healing after surgery. If you need help quitting, ask your health care provider.  Tell your health care provider if: ? You develop any skin infections or skin irritations. You may need to improve the condition of your skin before surgery. ? You have a fever, a cold, or any other illness in the week before your surgery.  Do not drink any alcohol for at least 48 hours before surgery.  The day before your surgery, follow instructions from your health care provider about showering, eating, drinking, and taking medicines. These directions are for your safety.  Talk to your health care provider about doing exercises before your surgery. ? Be sure to follow the exercise program only as directed by your health care provider. ? Doing these exercises in the weeks before your surgery may help reduce pain and improve function after surgery. Summary  Getting prepared before knee replacement surgery can make your recovery easier and more comfortable.  Prepare your home and arrange for help at home.  Keep all of your preoperative appointments to ensure that you are ready for your surgery.  Plan to have someone take you home from the hospital and stay with you day and night for the first week. This information is not intended to replace advice given to you by your health care provider. Make sure you discuss any questions you have with your health care provider. Document Released: 05/16/2010 Document Revised: 03/29/2017 Document Reviewed: 03/29/2017 Elsevier Patient Education  2020 [employer]

## 2019-01-25 ENCOUNTER — Telehealth: Payer: Self-pay | Admitting: Radiology

## 2019-01-25 DIAGNOSIS — M1711 Unilateral primary osteoarthritis, right knee: Secondary | ICD-10-CM

## 2019-01-25 NOTE — Telephone Encounter (Signed)
-----   Message from Carole Civil, MD sent at 01/24/2019  4:41 PM EST ----- Set up feb 1 outpx pt at [facility]

## 2019-02-07 ENCOUNTER — Telehealth: Payer: Self-pay | Admitting: Radiology

## 2019-02-07 NOTE — Telephone Encounter (Signed)
Left message for patient to call me back, have to cancel surgery for now, no beds due to Covid

## 2019-02-10 ENCOUNTER — Telehealth: Payer: Self-pay

## 2019-02-10 NOTE — Telephone Encounter (Signed)
Patient called and I relayed your message to her. She is asking if you would give her a return call when you have a chance she has some questions.  [phone]

## 2019-02-10 NOTE — Telephone Encounter (Signed)
I called her to discuss she is trying to do the surgery before her deductible starts again. I have advised as soon as I hear something I will let her know.

## 2019-02-21 ENCOUNTER — Encounter: Payer: Self-pay | Admitting: Internal Medicine

## 2019-02-21 ENCOUNTER — Telehealth: Payer: Self-pay | Admitting: Gastroenterology

## 2019-02-21 ENCOUNTER — Ambulatory Visit: Payer: BC Managed Care – PPO | Admitting: Gastroenterology

## 2019-02-21 NOTE — Telephone Encounter (Signed)
PATIENT WAS A NO SHOW AND LETTER SENT  °

## 2019-03-01 ENCOUNTER — Telehealth: Payer: Self-pay | Admitting: Orthopedic Surgery

## 2019-03-01 ENCOUNTER — Other Ambulatory Visit (HOSPITAL_COMMUNITY): Payer: BC Managed Care – PPO

## 2019-03-01 NOTE — Telephone Encounter (Signed)
Patient called to inquire about surgery date - cancelled?  Please advise regarding re-schedule. Phone [phone]

## 2019-03-01 NOTE — Telephone Encounter (Signed)
Not able to RS now.  I have advised patient.

## 2019-03-03 ENCOUNTER — Other Ambulatory Visit (HOSPITAL_COMMUNITY): Payer: BC Managed Care – PPO

## 2019-03-07 ENCOUNTER — Ambulatory Visit (HOSPITAL_COMMUNITY)
Admission: RE | Admit: 2019-03-07 | Payer: BC Managed Care – PPO | Source: Home / Self Care | Admitting: Orthopedic Surgery

## 2019-03-07 ENCOUNTER — Encounter (HOSPITAL_COMMUNITY): Admission: RE | Payer: Self-pay | Source: Home / Self Care

## 2019-03-07 SURGERY — ARTHROPLASTY, KNEE, TOTAL
Anesthesia: Choice | Site: Knee | Laterality: Right

## 2019-03-10 ENCOUNTER — Telehealth: Payer: Self-pay | Admitting: Internal Medicine

## 2019-03-10 MED ORDER — PANTOPRAZOLE SODIUM 40 MG PO TBEC: 40.0000 mg | DELAYED_RELEASE_TABLET | Freq: Two times a day (BID) | ORAL | 3 refills | Status: DC

## 2019-03-10 NOTE — Telephone Encounter (Signed)
Completed.

## 2019-03-10 NOTE — Telephone Encounter (Signed)
Noted. Pt is aware 

## 2019-03-10 NOTE — Telephone Encounter (Signed)
Pt said Dexilant was not helping her and it was too expensive and wants to go back to what she was taking. She doesn't know the name just that it's an acid pill. She uses [facility]. Please advise [phone]

## 2019-03-10 NOTE — Telephone Encounter (Signed)
Spoke with pt. Pt feels the Dexilant isn't working as well as the Pantoprazole 40 mg. Pt would like to go back on the Pantoprazole and would need a prescription sent into her pharmacy.  Pt also wants to apologize for missing her apt in December. Pt was very sick and her daughter forgot to call and cancel the apt.

## 2019-03-23 DIAGNOSIS — M79602 Pain in left arm: Secondary | ICD-10-CM | POA: Diagnosis not present

## 2019-03-23 DIAGNOSIS — S4992XA Unspecified injury of left shoulder and upper arm, initial encounter: Secondary | ICD-10-CM | POA: Diagnosis not present

## 2019-03-24 ENCOUNTER — Ambulatory Visit: Payer: BC Managed Care – PPO | Admitting: Orthopedic Surgery

## 2019-03-29 ENCOUNTER — Telehealth (HOSPITAL_COMMUNITY): Payer: Self-pay | Admitting: Physical Therapy

## 2019-03-29 NOTE — Telephone Encounter (Signed)
pt cancelled appts because her surgery was cancelled

## 2019-03-30 ENCOUNTER — Ambulatory Visit (HOSPITAL_COMMUNITY): Payer: BC Managed Care – PPO | Admitting: Physical Therapy

## 2019-03-30 ENCOUNTER — Telehealth: Payer: Self-pay | Admitting: Radiology

## 2019-03-30 DIAGNOSIS — L989 Disorder of the skin and subcutaneous tissue, unspecified: Secondary | ICD-10-CM | POA: Diagnosis not present

## 2019-03-30 DIAGNOSIS — K219 Gastro-esophageal reflux disease without esophagitis: Secondary | ICD-10-CM | POA: Diagnosis not present

## 2019-03-30 DIAGNOSIS — Z0000 Encounter for general adult medical examination without abnormal findings: Secondary | ICD-10-CM | POA: Diagnosis not present

## 2019-03-30 DIAGNOSIS — N959 Unspecified menopausal and perimenopausal disorder: Secondary | ICD-10-CM | POA: Diagnosis not present

## 2019-03-30 DIAGNOSIS — I10 Essential (primary) hypertension: Secondary | ICD-10-CM | POA: Diagnosis not present

## 2019-03-30 DIAGNOSIS — Z6836 Body mass index (BMI) 36.0-36.9, adult: Secondary | ICD-10-CM | POA: Diagnosis not present

## 2019-03-30 DIAGNOSIS — E6609 Other obesity due to excess calories: Secondary | ICD-10-CM | POA: Diagnosis not present

## 2019-03-30 DIAGNOSIS — R7309 Other abnormal glucose: Secondary | ICD-10-CM | POA: Diagnosis not present

## 2019-03-30 NOTE — Telephone Encounter (Signed)
Patient was scheduled for total knee replacement in January  Had to cancel I would like you to look at surgery schedule and see if there is any way we can get her rescheduled before end of February, as her deductible starts over end of February.  Thanks

## 2019-03-31 ENCOUNTER — Other Ambulatory Visit: Payer: Self-pay | Admitting: Orthopedic Surgery

## 2019-03-31 NOTE — Telephone Encounter (Signed)
Left another message.

## 2019-03-31 NOTE — Telephone Encounter (Signed)
FEB 23RD

## 2019-03-31 NOTE — Telephone Encounter (Signed)
Patient returned call regarding the message received regarding surgery. Said cannot always pick up if at work-said just heading there now (9:47am) - please call back at [phone].

## 2019-03-31 NOTE — Telephone Encounter (Signed)
Left message for her to call me back. 

## 2019-03-31 NOTE — Telephone Encounter (Signed)
Patient returned call; I relayed the surgery date as noted: FEB 23RD, which patient said will be fine. States her 2020 insurance card is in effect through 04/23/19, and she will then receive a new card.

## 2019-04-04 ENCOUNTER — Ambulatory Visit (HOSPITAL_COMMUNITY): Payer: BC Managed Care – PPO | Admitting: Physical Therapy

## 2019-04-06 ENCOUNTER — Encounter (HOSPITAL_COMMUNITY): Payer: BC Managed Care – PPO | Admitting: Physical Therapy

## 2019-04-06 NOTE — Patient Instructions (Signed)
[name]  04/06/2019     @PREFPERIOPPHARMACY @   Your procedure is scheduled on  04/18/2019.  Report to 04/20/2019 at  [phone]  A.M.  Call this number if you have problems the morning of surgery:  [phone]   Remember:  Do not eat or drink after midnight.                        Take these medicines the morning of surgery with A SIP OF WATER  Voltaren(if needed) or hydrocodone (if needed), oxybutynin, protonix.    Do not wear jewelry, make-up or nail polish.  Do not wear lotions, powders, or perfumes. Please wear deodorant and brush your teeth.  Do not shave 48 hours prior to surgery.  Men may shave face and neck.  Do not bring valuables to the hospital.  [facility] is not responsible for any belongings or valuables.  Contacts, dentures or bridgework may not be worn into surgery.  Leave your suitcase in the car.  After surgery it may be brought to your room.  For patients admitted to the hospital, discharge time will be determined by your treatment team.  Patients discharged the day of surgery will not be allowed to drive home.   Name and phone number of your driver:   family Special instructions:  DO NOT smoke the morning of your surgery.  Please read over the following fact sheets that you were given. Pain Booklet, Coughing and Deep Breathing, Blood Transfusion Information, Total Joint Packet, MRSA Information, Surgical Site Infection Prevention, Anesthesia Post-op Instructions and Care and Recovery After Surgery       Total Knee Replacement, Care After This sheet gives you information about how to care for yourself after your procedure. Your health care provider may also give you more specific instructions. If you have problems or questions, contact your health care provider. What can I expect after the procedure? After the procedure, it is common to have:  Pain.  Swelling.  A small amount of blood or clear fluid coming from your  incision.  Limited range of motion. Follow these instructions at home: Medicines  Take over-the-counter and prescription medicines only as told by your health care provider.  If you were prescribed a blood thinner (anticoagulant), take it as told by your health care provider.  Ask your health care provider if the medicine prescribed to you: ? Requires you to avoid driving or using heavy machinery. ? Can cause constipation. You may need to take actions to prevent or treat constipation, such as:  Drink enough fluid to keep your urine pale yellow.  Take over-the-counter or prescription medicines.  Eat foods that are high in fiber, such as beans, whole grains, and fresh fruits and vegetables.  Limit foods that are high in fat and processed sugars, such as fried or sweet foods. Bathing  Do not take baths, swim, or use a hot tub until your health care provider approves. Ask your health care provider if you may take showers. You may only be allowed to take sponge baths.  Keep your bandage (dressing) dry until your health care provider says it can be removed. Incision care and drain care   Follow instructions from your health care provider about how to take care of your incision. Make sure you: ? Wash your hands with soap and water before and after you change your dressing. If soap and water are  not available, use hand sanitizer. ? Change your dressing as told by your health care provider. ? Leave stitches (sutures), skin glue, or adhesive strips in place. These skin closures may need to stay in place for 2 weeks or longer. If adhesive strip edges start to loosen and curl up, you may trim the loose edges. Do not remove adhesive strips completely unless your health care provider tells you to do that.  Check your incision area and drain site every day for signs of infection. Check for: ? More redness, swelling, or pain. ? More fluid or blood. ? Warmth. ? Pus or a bad smell.  If you have  a drain, follow instructions from your health care provider about caring for it. Managing pain, stiffness, and swelling      If directed, put ice on your knee. ? Put ice in a plastic bag or use the icing device (cold flow pad or cryocuff) that you were given. Follow instructions from your health care provider about how to use the icing device. ? Place a towel between your skin and the bag or between your skin and the icing device. ? Leave the ice on for 20 minutes, 2-3 times per day.  If directed, apply heat to the affected area before you exercise. Use the heat source that your health care provider recommends, such as a moist heat pack or a heating pad. ? Place a towel between your skin and the heat source. ? Leave the heat on for 20-30 minutes. ? Remove the heat if your skin turns bright red. This is especially important if you are unable to feel pain, heat, or cold. You may have a greater risk of getting burned.  Move your toes often to avoid stiffness and to lessen swelling.  Raise (elevate) your leg above the level of your heart while you are sitting or lying down. ? Use several pillows to keep your leg straight. ? Do not put a pillow just under the knee. If the knee is bent for a long time, this may lead to stiffness.  Wear elastic knee support as told by your health care provider. Activity  Rest as told by your health care provider.  Avoid sitting for a long time without moving. Get up to take short walks every 1-2 hours. This is important to improve blood flow and breathing. Ask for help if you feel weak or unsteady.  Ask your health care provider what activities are safe for you.  Avoid high-impact activities, including running, jumping rope, and jumping jacks.  Do not play contact sports until your health care provider approves.  Do exercises as told by your physical therapist.  If you have been sent home with a continuous passive motion machine, use it as told by your  health care provider. Safety   Do not use your leg to support your body weight until your health care provider approves. Use crutches or a walker as told by your health care provider.  Do not drive until your health care provider approves. Ask your health care provider when it is safe to drive. General instructions  Do not use any products that contain nicotine or tobacco, such as cigarettes, e-cigarettes, and chewing tobacco. These can delay healing after surgery. If you need help quitting, ask your health care provider.  Wear compression stockings as told by your health care provider.  Tell your health care provider if you plan to have dental work. Also: ? Tell your dentist about your joint  replacement. ? Ask your health care provider if there are any special instructions you need to follow before having dental care and routine cleanings.  Keep all follow-up visits as told by your health care provider. This is important. Contact a health care provider if you have:  More redness, swelling, or pain around your incision or drain.  More fluid or blood coming from your incision or drain.  Pus or a bad smell coming from your incision or drain.  Warmth on your incision or drain site.  A fever.  An incision that breaks open.  Knee pain that does not go away.  Range of motion in your knee that is getting worse.  A prosthesis that feels loose. Get help right away if you have:  Pain or swelling in your calf or thigh.  Shortness of breath or difficulty breathing.  Chest pain. Summary  After the procedure, it is common to have pain and swelling, blood or fluid coming from your incision, and limited range of motion.  Follow instructions from your health care provider about how to take care of your incision.  Use crutches or a walker as told by your health care provider.  If you were prescribed a blood thinner (anticoagulant), take it as told by your health care  provider.  Keep all follow-up visits as told by your health care provider. This is important. This information is not intended to replace advice given to you by your health care provider. Make sure you discuss any questions you have with your health care provider. Document Revised: 06/20/2018 Document Reviewed: 09/23/2017 Elsevier Patient Education  2020 [employer]  Spinal Anesthesia and Epidural Anesthesia, Care After This sheet gives you information about how to care for yourself after your procedure. Your doctor may also give you more specific instructions. If you have problems or questions, call your doctor. Follow these instructions at home: For at least 24 hours after the procedure:   Have a responsible adult stay with you. It is important to have someone help care for you until you are awake and alert.  Rest as needed.  Do not do activities where you could fall or get hurt (injured).  Do not drive.  Do not use heavy machinery.  Do not drink alcohol.  Do not take sleeping pills or medicines that make you sleepy (drowsy).  Do not make important decisions.  Do not sign legal documents.  Do not take care of children on your own. Eating and drinking  If you throw up (vomit), drink water, juice, or soup when nausea and vomiting stop.  Drink enough fluid to keep your pee (urine) pale yellow.  Make sure you do not feel like throwing up (nauseous) before you eat solid foods.  Follow the diet that your doctor recommends. General instructions  Return to your normal activities as told by your doctor. Ask your doctor what activities are safe for you.  Take over-the-counter and prescription medicines only as told by your doctor.  If you have sleep apnea, surgery and certain medicines can raise your risk for breathing problems. Follow instructions from your doctor about when to wear your sleep device. Your doctor may tell you to wear your sleep device: ? Anytime you are  sleeping, including during daytime naps. ? While taking prescription pain medicines, sleeping pills, or medicines that make you sleepy.  Do not use any products that contain nicotine or tobacco. This includes cigarettes and e-cigarettes. ? If you need help quitting, ask your doctor. ?  If you smoke, do not smoke by yourself. Make sure someone is nearby in case you need help.  Keep all follow-up visits as told by your doctor. This is important. Contact a doctor if:  It has been more than one day since your procedure and you feel like throwing up.  It has been more than one day since your procedure and you throw up.  You have a rash. Get help right away if:  You have a fever.  You have a headache that lasts a long time.  You have a very bad headache.  Your vision is blurry.  You see two of a single object (double vision).  You are dizzy or light-headed.  You faint.  Your arms or legs tingle, feel weak, or get numb.  You have trouble breathing.  You cannot pee (urinate). Summary  After the procedure, have a responsible adult stay with you at home until you are fully awake and alert.  Do not do activities that might get you injured. Do not drive, use heavy machinery, drink alcohol, or make important decisions for 24 hours after the procedure.  Take medicines as told by your doctor. Do not use products that contain nicotine or tobacco.  Get help right away if you have a fever, blurry vision, difficulty breathing or passing urine, or weakness or numbness in arms or legs. This information is not intended to replace advice given to you by your health care provider. Make sure you discuss any questions you have with your health care provider. Document Revised: 01/22/2017 Document Reviewed: 06/03/2015 Elsevier Patient Education  2020 [employer]  General Anesthesia, Adult, Care After This sheet gives you information about how to care for yourself after your procedure. Your  health care provider may also give you more specific instructions. If you have problems or questions, contact your health care provider. What can I expect after the procedure? After the procedure, the following side effects are common:  Pain or discomfort at the IV site.  Nausea.  Vomiting.  Sore throat.  Trouble concentrating.  Feeling cold or chills.  Weak or tired.  Sleepiness and fatigue.  Soreness and body aches. These side effects can affect parts of the body that were not involved in surgery. Follow these instructions at home:  For at least 24 hours after the procedure:  Have a responsible adult stay with you. It is important to have someone help care for you until you are awake and alert.  Rest as needed.  Do not: ? Participate in activities in which you could fall or become injured. ? Drive. ? Use heavy machinery. ? Drink alcohol. ? Take sleeping pills or medicines that cause drowsiness. ? Make important decisions or sign legal documents. ? Take care of children on your own. Eating and drinking  Follow any instructions from your health care provider about eating or drinking restrictions.  When you feel hungry, start by eating small amounts of foods that are soft and easy to digest (bland), such as toast. Gradually return to your regular diet.  Drink enough fluid to keep your urine pale yellow.  If you vomit, rehydrate by drinking water, juice, or clear broth. General instructions  If you have sleep apnea, surgery and certain medicines can increase your risk for breathing problems. Follow instructions from your health care provider about wearing your sleep device: ? Anytime you are sleeping, including during daytime naps. ? While taking prescription pain medicines, sleeping medicines, or medicines that make you drowsy.  Return to your normal activities as told by your health care provider. Ask your health care provider what activities are safe for  you.  Take over-the-counter and prescription medicines only as told by your health care provider.  If you smoke, do not smoke without supervision.  Keep all follow-up visits as told by your health care provider. This is important. Contact a health care provider if:  You have nausea or vomiting that does not get better with medicine.  You cannot eat or drink without vomiting.  You have pain that does not get better with medicine.  You are unable to pass urine.  You develop a skin rash.  You have a fever.  You have redness around your IV site that gets worse. Get help right away if:  You have difficulty breathing.  You have chest pain.  You have blood in your urine or stool, or you vomit blood. Summary  After the procedure, it is common to have a sore throat or nausea. It is also common to feel tired.  Have a responsible adult stay with you for the first 24 hours after general anesthesia. It is important to have someone help care for you until you are awake and alert.  When you feel hungry, start by eating small amounts of foods that are soft and easy to digest (bland), such as toast. Gradually return to your regular diet.  Drink enough fluid to keep your urine pale yellow.  Return to your normal activities as told by your health care provider. Ask your health care provider what activities are safe for you. This information is not intended to replace advice given to you by your health care provider. Make sure you discuss any questions you have with your health care provider. Document Revised: 02/12/2017 Document Reviewed: 09/25/2016 Elsevier Patient Education  [place]. How to Use Chlorhexidine for Bathing Chlorhexidine gluconate (CHG) is a germ-killing (antiseptic) solution that is used to clean the skin. It can get rid of the bacteria that normally live on the skin and can keep them away for about 24 hours. To clean your skin with CHG, you may be given:  A CHG  solution to use in the shower or as part of a sponge bath.  A prepackaged cloth that contains CHG. Cleaning your skin with CHG may help lower the risk for infection:  While you are staying in the intensive care unit of the hospital.  If you have a vascular access, such as a central line, to provide short-term or long-term access to your veins.  If you have a catheter to drain urine from your bladder.  If you are on a ventilator. A ventilator is a machine that helps you breathe by moving air in and out of your lungs.  After surgery. What are the risks? Risks of using CHG include:  A skin reaction.  Hearing loss, if CHG gets in your ears.  Eye injury, if CHG gets in your eyes and is not rinsed out.  The CHG product catching fire. Make sure that you avoid smoking and flames after applying CHG to your skin. Do not use CHG:  If you have a chlorhexidine allergy or have previously reacted to chlorhexidine.  On babies younger than 41 months of age. How to use CHG solution  Use CHG only as told by your health care provider, and follow the instructions on the label.  Use the full amount of CHG as directed. Usually, this is one bottle. During a  shower Follow these steps when using CHG solution during a shower (unless your health care provider gives you different instructions): 1. Start the shower. 2. Use your normal soap and shampoo to wash your face and hair. 3. Turn off the shower or move out of the shower stream. 4. Pour the CHG onto a clean washcloth. Do not use any type of brush or rough-edged sponge. 5. Starting at your neck, lather your body down to your toes. Make sure you follow these instructions: ? If you will be having surgery, pay special attention to the part of your body where you will be having surgery. Scrub this area for at least 1 minute. ? Do not use CHG on your head or face. If the solution gets into your ears or eyes, rinse them well with water. ? Avoid your  genital area. ? Avoid any areas of skin that have broken skin, cuts, or scrapes. ? Scrub your back and under your arms. Make sure to wash skin folds. 6. Let the lather sit on your skin for 1-2 minutes or as long as told by your health care provider. 7. Thoroughly rinse your entire body in the shower. Make sure that all body creases and crevices are rinsed well. 8. Dry off with a clean towel. Do not put any substances on your body afterward--such as powder, lotion, or perfume--unless you are told to do so by your health care provider. Only use lotions that are recommended by the manufacturer. 9. Put on clean clothes or pajamas. 10. If it is the night before your surgery, sleep in clean sheets.  During a sponge bath Follow these steps when using CHG solution during a sponge bath (unless your health care provider gives you different instructions): 1. Use your normal soap and shampoo to wash your face and hair. 2. Pour the CHG onto a clean washcloth. 3. Starting at your neck, lather your body down to your toes. Make sure you follow these instructions: ? If you will be having surgery, pay special attention to the part of your body where you will be having surgery. Scrub this area for at least 1 minute. ? Do not use CHG on your head or face. If the solution gets into your ears or eyes, rinse them well with water. ? Avoid your genital area. ? Avoid any areas of skin that have broken skin, cuts, or scrapes. ? Scrub your back and under your arms. Make sure to wash skin folds. 4. Let the lather sit on your skin for 1-2 minutes or as long as told by your health care provider. 5. Using a different clean, wet washcloth, thoroughly rinse your entire body. Make sure that all body creases and crevices are rinsed well. 6. Dry off with a clean towel. Do not put any substances on your body afterward--such as powder, lotion, or perfume--unless you are told to do so by your health care provider. Only use lotions  that are recommended by the manufacturer. 7. Put on clean clothes or pajamas. 8. If it is the night before your surgery, sleep in clean sheets. How to use CHG prepackaged cloths  Only use CHG cloths as told by your health care provider, and follow the instructions on the label.  Use the CHG cloth on clean, dry skin.  Do not use the CHG cloth on your head or face unless your health care provider tells you to.  When washing with the CHG cloth: ? Avoid your genital area. ? Avoid any areas  of skin that have broken skin, cuts, or scrapes. Before surgery Follow these steps when using a CHG cloth to clean before surgery (unless your health care provider gives you different instructions): 1. Using the CHG cloth, vigorously scrub the part of your body where you will be having surgery. Scrub using a back-and-forth motion for 3 minutes. The area on your body should be completely wet with CHG when you are done scrubbing. 2. Do not rinse. Discard the cloth and let the area air-dry. Do not put any substances on the area afterward, such as powder, lotion, or perfume. 3. Put on clean clothes or pajamas. 4. If it is the night before your surgery, sleep in clean sheets.  For general bathing Follow these steps when using CHG cloths for general bathing (unless your health care provider gives you different instructions). 1. Use a separate CHG cloth for each area of your body. Make sure you wash between any folds of skin and between your fingers and toes. Wash your body in the following order, switching to a new cloth after each step: ? The front of your neck, shoulders, and chest. ? Both of your arms, under your arms, and your hands. ? Your stomach and groin area, avoiding the genitals. ? Your right leg and foot. ? Your left leg and foot. ? The back of your neck, your back, and your buttocks. 2. Do not rinse. Discard the cloth and let the area air-dry. Do not put any substances on your body afterward--such  as powder, lotion, or perfume--unless you are told to do so by your health care provider. Only use lotions that are recommended by the manufacturer. 3. Put on clean clothes or pajamas. Contact a health care provider if:  Your skin gets irritated after scrubbing.  You have questions about using your solution or cloth. Get help right away if:  Your eyes become very red or swollen.  Your eyes itch badly.  Your skin itches badly and is red or swollen.  Your hearing changes.  You have trouble seeing.  You have swelling or tingling in your mouth or throat.  You have trouble breathing.  You swallow any chlorhexidine. Summary  Chlorhexidine gluconate (CHG) is a germ-killing (antiseptic) solution that is used to clean the skin. Cleaning your skin with CHG may help to lower your risk for infection.  You may be given CHG to use for bathing. It may be in a bottle or in a prepackaged cloth to use on your skin. Carefully follow your health care provider's instructions and the instructions on the product label.  Do not use CHG if you have a chlorhexidine allergy.  Contact your health care provider if your skin gets irritated after scrubbing. This information is not intended to replace advice given to you by your health care provider. Make sure you discuss any questions you have with your health care provider. Document Revised: 04/28/2018 Document Reviewed: 01/07/2017 Elsevier Patient Education  2020 [employer]

## 2019-04-07 ENCOUNTER — Encounter: Payer: Self-pay | Admitting: Orthopedic Surgery

## 2019-04-11 ENCOUNTER — Encounter (HOSPITAL_COMMUNITY): Payer: BC Managed Care – PPO | Admitting: Physical Therapy

## 2019-04-13 ENCOUNTER — Encounter (HOSPITAL_COMMUNITY): Payer: BC Managed Care – PPO | Admitting: Physical Therapy

## 2019-04-13 ENCOUNTER — Encounter (HOSPITAL_COMMUNITY)
Admission: RE | Admit: 2019-04-13 | Discharge: 2019-04-13 | Disposition: A | Discharge status: Home / Self Care | Payer: BC Managed Care – PPO | Source: Ambulatory Visit | Attending: Orthopedic Surgery | Admitting: Orthopedic Surgery

## 2019-04-13 ENCOUNTER — Encounter (HOSPITAL_COMMUNITY): Admission: RE | Admit: 2019-04-13 | Payer: BC Managed Care – PPO | Source: Ambulatory Visit

## 2019-04-14 ENCOUNTER — Encounter (HOSPITAL_COMMUNITY): Payer: BC Managed Care – PPO

## 2019-04-17 ENCOUNTER — Other Ambulatory Visit (HOSPITAL_COMMUNITY)
Admission: RE | Admit: 2019-04-17 | Discharge: 2019-04-17 | Disposition: A | Discharge status: Home / Self Care | Payer: BC Managed Care – PPO | Source: Ambulatory Visit | Attending: Orthopedic Surgery | Admitting: Orthopedic Surgery

## 2019-04-17 ENCOUNTER — Other Ambulatory Visit: Payer: Self-pay

## 2019-04-17 ENCOUNTER — Encounter (HOSPITAL_COMMUNITY)
Admission: RE | Admit: 2019-04-17 | Discharge: 2019-04-17 | Disposition: A | Discharge status: Home / Self Care | Payer: BC Managed Care – PPO | Source: Ambulatory Visit | Attending: Orthopedic Surgery | Admitting: Orthopedic Surgery

## 2019-04-17 ENCOUNTER — Other Ambulatory Visit: Payer: Self-pay | Admitting: Orthopedic Surgery

## 2019-04-17 DIAGNOSIS — M1711 Unilateral primary osteoarthritis, right knee: Secondary | ICD-10-CM

## 2019-04-17 DIAGNOSIS — Z01810 Encounter for preprocedural cardiovascular examination: Secondary | ICD-10-CM | POA: Insufficient documentation

## 2019-04-17 DIAGNOSIS — Z20822 Contact with and (suspected) exposure to covid-19: Secondary | ICD-10-CM | POA: Insufficient documentation

## 2019-04-17 DIAGNOSIS — R9431 Abnormal electrocardiogram [ECG] [EKG]: Secondary | ICD-10-CM | POA: Insufficient documentation

## 2019-04-17 DIAGNOSIS — Z01818 Encounter for other preprocedural examination: Secondary | ICD-10-CM | POA: Insufficient documentation

## 2019-04-17 DIAGNOSIS — Z01812 Encounter for preprocedural laboratory examination: Secondary | ICD-10-CM | POA: Insufficient documentation

## 2019-04-17 LAB — BASIC METABOLIC PANEL
Anion gap: 7 (ref 5–15)
BUN: 17 mg/dL (ref 6–20)
CO2: 25 mmol/L (ref 22–32)
Calcium: 8.7 mg/dL — ABNORMAL LOW (ref 8.9–10.3)
Chloride: 106 mmol/L (ref 98–111)
Creatinine, Ser: 0.54 mg/dL (ref 0.44–1.00)
GFR calc Af Amer: >60 mL/min (ref >60)
GFR calc non Af Amer: >60 mL/min (ref >60)
Glucose, Bld: 101 mg/dL — ABNORMAL HIGH (ref 70–99)
Potassium: 3.6 mmol/L (ref 3.5–5.1)
Sodium: 138 mmol/L (ref 135–145)

## 2019-04-17 LAB — SARS CORONAVIRUS 2 (TAT 6-24 HRS): SARS Coronavirus 2: NEGATIVE

## 2019-04-17 LAB — CBC WITH DIFFERENTIAL/PLATELET
Abs Immature Granulocytes: 0.02 10*3/uL (ref 0.00–0.07)
Basophils Absolute: 0.1 10*3/uL (ref 0.0–0.1)
Basophils Relative: 1 %
Eosinophils Absolute: 0.2 10*3/uL (ref 0.0–0.5)
Eosinophils Relative: 2 %
HCT: 38.6 % (ref 36.0–46.0)
Hemoglobin: 12.5 g/dL (ref 12.0–15.0)
Immature Granulocytes: 0 %
Lymphocytes Relative: 27 %
Lymphs Abs: 2 10*3/uL (ref 0.7–4.0)
MCH: 29.8 pg (ref 26.0–34.0)
MCHC: 32.4 g/dL (ref 30.0–36.0)
MCV: 91.9 fL (ref 80.0–100.0)
Monocytes Absolute: 0.4 10*3/uL (ref 0.1–1.0)
Monocytes Relative: 5 %
Neutro Abs: 4.7 10*3/uL (ref 1.7–7.7)
Neutrophils Relative %: 65 %
Platelets: 273 10*3/uL (ref 150–400)
RBC: 4.2 MIL/uL (ref 3.87–5.11)
RDW: 12.8 % (ref 11.5–15.5)
WBC: 7.3 10*3/uL (ref 4.0–10.5)
nRBC: 0 % (ref 0.0–0.2)

## 2019-04-17 LAB — ABO/RH: ABO/RH(D): O NEG

## 2019-04-17 LAB — PREPARE RBC (CROSSMATCH)

## 2019-04-17 NOTE — Progress Notes (Signed)
[id number] OP observation [name] no prior approval is required/ if over 48 hours then approval will be needed.

## 2019-04-17 NOTE — H&P (Signed)
PREOP CONSULT/REFERRAL INTRA-OFFICE FROM DR Tyrone Apple         Chief Complaint  Patient presents with  . Knee Pain      surgical consult right knee       59 year old female presents as a consultation into her office from Dr. Luna Glasgow complains of severe pain right knee.   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  She had a knee scope 10 years ago she has had pain in the knee for approximately 10 years with severe pain requiring oral analgesics using hydrocodone.   Her ADLs are difficult for her such as getting in and out of a car getting out of a chair walking and even working.  Her BMI is 38.  She also complains of limited motion unable to bend the knee past 90 degrees.   She has a husband and 3 daughters.  She has steps into the house x4 no steps in the house.   She is agreeable to the 23-hour protocol discharge     Review of Systems  Gastrointestinal: Positive for heartburn.  Musculoskeletal: Positive for joint pain.  All other systems reviewed and are negative.           Past Medical History:  Diagnosis Date  . Anxiety    . Arthritis    . GERD (gastroesophageal reflux disease)    . Hypertension    . S/P endoscopy      2008: noncritical Schatzki ring s/p dilation, normal esophagus and stomach, 2003: normal esophagus, s/p Maloney dilation, multiple antral erosions consistent with chronic gastritis, no H.pylori,            Past Surgical History:  Procedure Laterality Date  . CESAREAN SECTION      . CHOLECYSTECTOMY      . COLONOSCOPY   10/17/2010    normal   . ESOPHAGOGASTRODUODENOSCOPY   10/17/2010    non-critical Schatzki's ring s/p dilation, patulous EG junction, small hiatal hernia, otherwise normal stomach, first and second portion of duodenum  . ESOPHAGOGASTRODUODENOSCOPY N/A 10/26/2018    Procedure: ESOPHAGOGASTRODUODENOSCOPY (EGD);  Surgeon: Daneil Dolin, MD;  Location: [facility];  Service:  Endoscopy;  Laterality: N/A;  9:15am  . FOOT SURGERY Right      heel spur  . HAND SURGERY Left      carpal tunnel  . KNEE ARTHROSCOPY WITH MEDIAL MENISECTOMY   01/07/2012    Procedure: KNEE ARTHROSCOPY WITH MEDIAL MENISECTOMY;  Surgeon: Sanjuana Kava, MD;  Location: [facility];  Service: Orthopedics;  Laterality: Right;  . MALONEY DILATION   10/17/2010    Procedure: Venia Minks DILATION;  Surgeon: Daneil Dolin, MD;  Location: [facility];  Service: Endoscopy;  Laterality: N/A;  . Venia Minks DILATION N/A 10/26/2018    Procedure: Venia Minks DILATION;  Surgeon: Daneil Dolin, MD;  Location: [facility];  Service: Endoscopy;  Laterality: N/A;  . TRIGGER FINGER RELEASE Right 08/20/2016    Procedure: RELEASE A-1 PULLEY RIGHT LONG FINGER;  Surgeon: Carole Civil, MD;  Location: [facility];  Service: Orthopedics;  Laterality: Right;           Family History  Problem Relation Age of Onset  . Cancer Father          esophagus ca, living  . Colon cancer Paternal Uncle          in 57s, early 66s  . Colon polyps Neg Hx  Social History         Tobacco Use  . Smoking status: Never Smoker  . Smokeless tobacco: Never Used  Substance Use Topics  . Alcohol use: Yes      Comment: drinks gin on weekends   . Drug use: No      Allergies  Allergen Reactions  . Acetaminophen-Codeine Rash      Tylenol with Codeine Tylenol #3,        Active Medications      Current Meds  Medication Sig  . Biotin w/ Vitamins C & E (HAIR/SKIN/NAILS PO) Take 2 tablets by mouth daily.   . Camphor-Menthol-Methyl Sal (TIGER BALM MUSCLE RUB EX) Apply 1 application topically daily as needed (muscle pain).  Marland Kitchen diclofenac (VOLTAREN) 75 MG EC tablet Take 75 mg by mouth 2 (two) times daily.  . hydrochlorothiazide (HYDRODIURIL) 25 MG tablet Take 25 mg by mouth daily.  . naproxen sodium (ALEVE) 220 MG tablet Take 440 mg by mouth 2 (two) times daily as needed (pain.).  Marland Kitchen oxybutynin (DITROPAN XL) 15 MG 24 hr tablet Take 15 mg by  mouth 2 (two) times daily.  . pantoprazole (PROTONIX) 40 MG tablet Take 1 tablet (40 mg total) by mouth 2 (two) times daily before a meal.  . Phenylephrine-Acetaminophen (SUDAFED PE PRESSURE + PAIN PO) Take 2 tablets by mouth at bedtime as needed (sinus pain).  . vitamin B-12 (CYANOCOBALAMIN) 500 MCG tablet Take 1,000 mcg by mouth daily.        Pulse 97   Ht 5\' 1"  (1.549 m)   Wt 202 lb (91.6 kg)   BMI 38.17 kg/m    Physical Exam General appearance is normal her body habitus is mesomorphic   Oriented x3   Mood affect normal     Ortho Exam    Gait varus thrust with limping favoring the right knee   Right knee is tender on the medial side she has full extension but 90 degrees of flexion.  Ligaments are stable in all planes.  Muscle tone and strength are normal.  Skin is clean without erythema.  Distal pulses are intact there is no edema.  Sensation is normal.  Balance and coordination are normal.       MEDICAL DECISION SECTION  xrays ordered?  X-rays previously taken show a varus knee with severe disease and joint space narrowing osteophytes the varus deformity itself is moderate   My independent reading of xrays: As noted         Encounter Diagnosis  Name Primary?  . Primary osteoarthritis of right knee Yes        PLAN:    Surgical procedure planned: Right total knee arthroplasty 23-hour protocol patient aware and agrees   The procedure has been fully reviewed with the patient; The risks and benefits of surgery have been discussed and explained and understood. Alternative treatment has also been reviewed, questions were encouraged and answered. The postoperative plan is also been reviewed.   Nonsurgical treatment as described in the history and physical section was attempted and unsuccessful and the patient has agreed to proceed with surgical intervention to improve their situation.

## 2019-04-18 ENCOUNTER — Ambulatory Visit (HOSPITAL_COMMUNITY): Payer: BC Managed Care – PPO | Admitting: Anesthesiology

## 2019-04-18 ENCOUNTER — Encounter (HOSPITAL_COMMUNITY): Payer: BC Managed Care – PPO | Admitting: Physical Therapy

## 2019-04-18 ENCOUNTER — Ambulatory Visit (HOSPITAL_COMMUNITY): Payer: BC Managed Care – PPO

## 2019-04-18 ENCOUNTER — Encounter (HOSPITAL_COMMUNITY): Payer: Self-pay | Admitting: Orthopedic Surgery

## 2019-04-18 ENCOUNTER — Observation Stay (HOSPITAL_COMMUNITY)
Admission: RE | Admit: 2019-04-18 | Discharge: 2019-04-19 | Disposition: A | Discharge status: Home / Self Care | Payer: BC Managed Care – PPO | Attending: Orthopedic Surgery | Admitting: Orthopedic Surgery

## 2019-04-18 ENCOUNTER — Encounter (HOSPITAL_COMMUNITY)
Admission: RE | Disposition: A | Discharge status: Home / Self Care | Payer: Self-pay | Source: Home / Self Care | Attending: Orthopedic Surgery

## 2019-04-18 DIAGNOSIS — Z800 Family history of malignant neoplasm of digestive organs: Secondary | ICD-10-CM | POA: Diagnosis not present

## 2019-04-18 DIAGNOSIS — M1990 Unspecified osteoarthritis, unspecified site: Secondary | ICD-10-CM | POA: Insufficient documentation

## 2019-04-18 DIAGNOSIS — Z885 Allergy status to narcotic agent status: Secondary | ICD-10-CM | POA: Diagnosis not present

## 2019-04-18 DIAGNOSIS — K219 Gastro-esophageal reflux disease without esophagitis: Secondary | ICD-10-CM | POA: Diagnosis not present

## 2019-04-18 DIAGNOSIS — M1711 Unilateral primary osteoarthritis, right knee: Secondary | ICD-10-CM | POA: Diagnosis not present

## 2019-04-18 DIAGNOSIS — F419 Anxiety disorder, unspecified: Secondary | ICD-10-CM | POA: Insufficient documentation

## 2019-04-18 DIAGNOSIS — I10 Essential (primary) hypertension: Secondary | ICD-10-CM | POA: Diagnosis not present

## 2019-04-18 DIAGNOSIS — Z9049 Acquired absence of other specified parts of digestive tract: Secondary | ICD-10-CM | POA: Diagnosis not present

## 2019-04-18 DIAGNOSIS — Z96651 Presence of right artificial knee joint: Secondary | ICD-10-CM

## 2019-04-18 DIAGNOSIS — Z471 Aftercare following joint replacement surgery: Secondary | ICD-10-CM | POA: Diagnosis not present

## 2019-04-18 DIAGNOSIS — Z79899 Other long term (current) drug therapy: Secondary | ICD-10-CM | POA: Insufficient documentation

## 2019-04-18 HISTORY — PX: TOTAL KNEE ARTHROPLASTY: SHX125

## 2019-04-18 LAB — GLUCOSE, CAPILLARY: Glucose-Capillary: 131 mg/dL — ABNORMAL HIGH (ref 70–99)

## 2019-04-18 SURGERY — ARTHROPLASTY, KNEE, TOTAL
Anesthesia: General | Site: Knee | Laterality: Right

## 2019-04-18 MED ORDER — PANTOPRAZOLE SODIUM 40 MG PO TBEC
40.0000 mg | DELAYED_RELEASE_TABLET | Freq: Two times a day (BID) | ORAL | Status: DC
  Administered 2019-04-18 – 2019-04-19 (×3): 40 mg via ORAL
  Filled 2019-04-18 (×3): qty 1

## 2019-04-18 MED ORDER — ROCURONIUM 10MG/ML (10ML) SYRINGE FOR MEDFUSION PUMP - OPTIME
INTRAVENOUS | Status: DC | PRN
  Administered 2019-04-18: 10 mg via INTRAVENOUS
  Administered 2019-04-18: 30 mg via INTRAVENOUS

## 2019-04-18 MED ORDER — OXYCODONE HCL 5 MG PO TABS
5.0000 mg | ORAL_TABLET | Freq: Once | ORAL | Status: AC
  Administered 2019-04-18: 5 mg via ORAL
  Filled 2019-04-18: qty 1

## 2019-04-18 MED ORDER — BISACODYL 5 MG PO TBEC: 5.0000 mg | DELAYED_RELEASE_TABLET | Freq: Every day | ORAL | Status: DC | PRN

## 2019-04-18 MED ORDER — CELECOXIB 400 MG PO CAPS
400.0000 mg | ORAL_CAPSULE | Freq: Once | ORAL | Status: AC
  Administered 2019-04-18: 11:00:00 400 mg via ORAL
  Filled 2019-04-18: qty 1

## 2019-04-18 MED ORDER — TRANEXAMIC ACID-NACL 1000-0.7 MG/100ML-% IV SOLN
1000.0000 mg | INTRAVENOUS | Status: AC
  Administered 2019-04-18: 1000 mg via INTRAVENOUS
  Filled 2019-04-18: qty 100

## 2019-04-18 MED ORDER — PROPOFOL 10 MG/ML IV BOLUS
INTRAVENOUS | Status: DC | PRN
  Administered 2019-04-18: 160 mg via INTRAVENOUS

## 2019-04-18 MED ORDER — MENTHOL 3 MG MT LOZG: 1.0000 | LOZENGE | OROMUCOSAL | Status: DC | PRN

## 2019-04-18 MED ORDER — ROPIVACAINE HCL 5 MG/ML IJ SOLN
INTRAMUSCULAR | Status: AC
  Filled 2019-04-18: qty 30

## 2019-04-18 MED ORDER — MIDAZOLAM HCL 2 MG/2ML IJ SOLN
INTRAMUSCULAR | Status: AC
  Filled 2019-04-18: qty 2

## 2019-04-18 MED ORDER — METHOCARBAMOL 500 MG PO TABS
500.0000 mg | ORAL_TABLET | Freq: Four times a day (QID) | ORAL | Status: DC | PRN
  Administered 2019-04-19: 500 mg via ORAL
  Filled 2019-04-18: qty 1

## 2019-04-18 MED ORDER — ASCORBIC ACID 500 MG PO TABS
500.0000 mg | ORAL_TABLET | Freq: Every day | ORAL | Status: DC
  Administered 2019-04-18 – 2019-04-19 (×2): 500 mg via ORAL
  Filled 2019-04-18 (×2): qty 1

## 2019-04-18 MED ORDER — EZETIMIBE 10 MG PO TABS
10.0000 mg | ORAL_TABLET | Freq: Every day | ORAL | Status: DC
  Administered 2019-04-19: 09:00:00 10 mg via ORAL
  Filled 2019-04-18: qty 1

## 2019-04-18 MED ORDER — BUPIVACAINE LIPOSOME 1.3 % IJ SUSP
INTRAMUSCULAR | Status: DC | PRN
  Administered 2019-04-18: 20 mL

## 2019-04-18 MED ORDER — POLYETHYLENE GLYCOL 3350 17 G PO PACK
17.0000 g | PACK | Freq: Every day | ORAL | Status: DC
  Administered 2019-04-19: 17 g via ORAL
  Filled 2019-04-18: qty 1

## 2019-04-18 MED ORDER — CEFAZOLIN SODIUM-DEXTROSE 2-4 GM/100ML-% IV SOLN
2.0000 g | INTRAVENOUS | Status: AC
  Administered 2019-04-18: 2 g via INTRAVENOUS
  Filled 2019-04-18: qty 100

## 2019-04-18 MED ORDER — HYDROMORPHONE HCL 1 MG/ML IJ SOLN
0.2500 mg | INTRAMUSCULAR | Status: DC | PRN
  Administered 2019-04-18 (×3): 0.5 mg via INTRAVENOUS
  Filled 2019-04-18 (×2): qty 0.5

## 2019-04-18 MED ORDER — POVIDONE-IODINE 10 % EX SWAB: 2.0000 "application " | Freq: Once | CUTANEOUS | Status: DC

## 2019-04-18 MED ORDER — FENTANYL CITRATE (PF) 100 MCG/2ML IJ SOLN
INTRAMUSCULAR | Status: AC
  Filled 2019-04-18: qty 2

## 2019-04-18 MED ORDER — SODIUM CHLORIDE 0.9 % IV SOLN: INTRAVENOUS | Status: DC

## 2019-04-18 MED ORDER — GABAPENTIN 300 MG PO CAPS
300.0000 mg | ORAL_CAPSULE | Freq: Three times a day (TID) | ORAL | Status: DC
  Administered 2019-04-18 – 2019-04-19 (×4): 300 mg via ORAL
  Filled 2019-04-18 (×4): qty 1

## 2019-04-18 MED ORDER — ONDANSETRON HCL 4 MG/2ML IJ SOLN
INTRAMUSCULAR | Status: DC | PRN
  Administered 2019-04-18: 4 mg via INTRAVENOUS

## 2019-04-18 MED ORDER — FENTANYL CITRATE (PF) 250 MCG/5ML IJ SOLN
INTRAMUSCULAR | Status: AC
  Filled 2019-04-18: qty 5

## 2019-04-18 MED ORDER — ONDANSETRON HCL 4 MG/2ML IJ SOLN
INTRAMUSCULAR | Status: AC
  Filled 2019-04-18: qty 2

## 2019-04-18 MED ORDER — SUCCINYLCHOLINE 20MG/ML (10ML) SYRINGE FOR MEDFUSION PUMP - OPTIME
INTRAMUSCULAR | Status: DC | PRN
  Administered 2019-04-18: 120 mg via INTRAVENOUS

## 2019-04-18 MED ORDER — OMEGA-3-ACID ETHYL ESTERS 1 G PO CAPS
1.0000 g | ORAL_CAPSULE | Freq: Every day | ORAL | Status: DC
  Administered 2019-04-19: 09:00:00 1 g via ORAL
  Filled 2019-04-18: qty 1

## 2019-04-18 MED ORDER — HYDROMORPHONE HCL 1 MG/ML IJ SOLN: 0.5000 mg | INTRAMUSCULAR | Status: DC | PRN

## 2019-04-18 MED ORDER — DEXAMETHASONE SODIUM PHOSPHATE 4 MG/ML IJ SOLN
INTRAMUSCULAR | Status: DC | PRN
  Administered 2019-04-18: 10 mg via INTRAVENOUS

## 2019-04-18 MED ORDER — ASPIRIN 81 MG PO CHEW
81.0000 mg | CHEWABLE_TABLET | Freq: Two times a day (BID) | ORAL | Status: DC
  Administered 2019-04-18 – 2019-04-19 (×2): 81 mg via ORAL
  Filled 2019-04-18 (×2): qty 1

## 2019-04-18 MED ORDER — VITAMIN D 25 MCG (1000 UNIT) PO TABS
2000.0000 [IU] | ORAL_TABLET | Freq: Every day | ORAL | Status: DC
  Administered 2019-04-19: 09:00:00 2000 [IU] via ORAL
  Filled 2019-04-18: qty 2

## 2019-04-18 MED ORDER — PHENOL 1.4 % MT LIQD: 1.0000 | OROMUCOSAL | Status: DC | PRN

## 2019-04-18 MED ORDER — CEFAZOLIN SODIUM-DEXTROSE 2-4 GM/100ML-% IV SOLN
2.0000 g | Freq: Four times a day (QID) | INTRAVENOUS | Status: AC
  Administered 2019-04-18 (×2): 2 g via INTRAVENOUS
  Filled 2019-04-18 (×2): qty 100

## 2019-04-18 MED ORDER — HYDROMORPHONE HCL 1 MG/ML IJ SOLN
INTRAMUSCULAR | Status: AC
  Filled 2019-04-18: qty 0.5

## 2019-04-18 MED ORDER — PROMETHAZINE HCL 25 MG/ML IJ SOLN: 6.2500 mg | INTRAMUSCULAR | Status: DC | PRN

## 2019-04-18 MED ORDER — METHOCARBAMOL 1000 MG/10ML IJ SOLN
500.0000 mg | Freq: Four times a day (QID) | INTRAVENOUS | Status: DC | PRN
  Filled 2019-04-18: qty 5

## 2019-04-18 MED ORDER — PREGABALIN 50 MG PO CAPS
50.0000 mg | ORAL_CAPSULE | Freq: Once | ORAL | Status: AC
  Administered 2019-04-18: 11:00:00 50 mg via ORAL
  Filled 2019-04-18: qty 1

## 2019-04-18 MED ORDER — SUGAMMADEX SODIUM 200 MG/2ML IV SOLN
INTRAVENOUS | Status: DC | PRN
  Administered 2019-04-18: 150 mg via INTRAVENOUS

## 2019-04-18 MED ORDER — DIPHENHYDRAMINE HCL 12.5 MG/5ML PO ELIX: 12.5000 mg | ORAL_SOLUTION | ORAL | Status: DC | PRN

## 2019-04-18 MED ORDER — MAGNESIUM CITRATE PO SOLN: 1.0000 | Freq: Once | ORAL | Status: DC | PRN

## 2019-04-18 MED ORDER — 0.9 % SODIUM CHLORIDE (POUR BTL) OPTIME
TOPICAL | Status: DC | PRN
  Administered 2019-04-18: 08:00:00 1000 mL

## 2019-04-18 MED ORDER — DOCUSATE SODIUM 100 MG PO CAPS
100.0000 mg | ORAL_CAPSULE | Freq: Two times a day (BID) | ORAL | Status: DC
  Administered 2019-04-18 – 2019-04-19 (×2): 100 mg via ORAL
  Filled 2019-04-18 (×2): qty 1

## 2019-04-18 MED ORDER — BUPIVACAINE LIPOSOME 1.3 % IJ SUSP
INTRAMUSCULAR | Status: AC
  Filled 2019-04-18: qty 20

## 2019-04-18 MED ORDER — ALUM & MAG HYDROXIDE-SIMETH 200-200-20 MG/5ML PO SUSP: 30.0000 mL | ORAL | Status: DC | PRN

## 2019-04-18 MED ORDER — CHLORHEXIDINE GLUCONATE 4 % EX LIQD: 60.0000 mL | Freq: Once | CUTANEOUS | Status: DC

## 2019-04-18 MED ORDER — ACETAMINOPHEN 500 MG PO TABS
1000.0000 mg | ORAL_TABLET | Freq: Four times a day (QID) | ORAL | Status: AC
  Administered 2019-04-18 – 2019-04-19 (×4): 1000 mg via ORAL
  Filled 2019-04-18 (×4): qty 2

## 2019-04-18 MED ORDER — LIDOCAINE HCL (CARDIAC) PF 50 MG/5ML IV SOSY
PREFILLED_SYRINGE | INTRAVENOUS | Status: DC | PRN
  Administered 2019-04-18: 60 mg via INTRAVENOUS

## 2019-04-18 MED ORDER — MIDAZOLAM HCL 2 MG/2ML IJ SOLN: 0.5000 mg | Freq: Once | INTRAMUSCULAR | Status: DC | PRN

## 2019-04-18 MED ORDER — BUPIVACAINE-EPINEPHRINE (PF) 0.5% -1:200000 IJ SOLN
INTRAMUSCULAR | Status: DC | PRN
  Administered 2019-04-18: 20 mL via PERINEURAL

## 2019-04-18 MED ORDER — DEXAMETHASONE SODIUM PHOSPHATE 10 MG/ML IJ SOLN
10.0000 mg | Freq: Once | INTRAMUSCULAR | Status: AC
  Administered 2019-04-19: 09:00:00 10 mg via INTRAVENOUS
  Filled 2019-04-18: qty 1

## 2019-04-18 MED ORDER — SEVOFLURANE IN SOLN
RESPIRATORY_TRACT | Status: AC
  Filled 2019-04-18: qty 250

## 2019-04-18 MED ORDER — ROPIVACAINE HCL 5 MG/ML IJ SOLN
INTRAMUSCULAR | Status: DC | PRN
  Administered 2019-04-18: 27 mL via EPIDURAL

## 2019-04-18 MED ORDER — TRANEXAMIC ACID-NACL 1000-0.7 MG/100ML-% IV SOLN
1000.0000 mg | Freq: Once | INTRAVENOUS | Status: AC
  Administered 2019-04-18: 1000 mg via INTRAVENOUS
  Filled 2019-04-18: qty 100

## 2019-04-18 MED ORDER — OXYBUTYNIN CHLORIDE ER 5 MG PO TB24
15.0000 mg | ORAL_TABLET | Freq: Two times a day (BID) | ORAL | Status: DC
  Administered 2019-04-18 – 2019-04-19 (×2): 15 mg via ORAL
  Filled 2019-04-18 (×2): qty 3

## 2019-04-18 MED ORDER — ZINC SULFATE 220 (50 ZN) MG PO CAPS
220.0000 mg | ORAL_CAPSULE | Freq: Every day | ORAL | Status: DC
  Administered 2019-04-19: 220 mg via ORAL
  Filled 2019-04-18: qty 1

## 2019-04-18 MED ORDER — ACETAMINOPHEN 500 MG PO TABS
500.0000 mg | ORAL_TABLET | Freq: Once | ORAL | Status: AC
  Administered 2019-04-18: 500 mg via ORAL
  Filled 2019-04-18: qty 1

## 2019-04-18 MED ORDER — FENTANYL CITRATE (PF) 100 MCG/2ML IJ SOLN
INTRAMUSCULAR | Status: DC | PRN
  Administered 2019-04-18 (×5): 50 ug via INTRAVENOUS
  Administered 2019-04-18: 25 ug via INTRAVENOUS
  Administered 2019-04-18: 50 ug via INTRAVENOUS
  Administered 2019-04-18 (×2): 25 ug via INTRAVENOUS
  Administered 2019-04-18: 50 ug via INTRAVENOUS
  Administered 2019-04-18: 100 ug via INTRAVENOUS
  Administered 2019-04-18: 25 ug via INTRAVENOUS
  Administered 2019-04-18: 50 ug via INTRAVENOUS

## 2019-04-18 MED ORDER — CAMPHOR-MENTHOL-METHYL SAL 3-5-15 % EX CREA: TOPICAL_CREAM | Freq: Every day | CUTANEOUS | Status: DC | PRN

## 2019-04-18 MED ORDER — ONDANSETRON HCL 4 MG/2ML IJ SOLN
4.0000 mg | Freq: Once | INTRAMUSCULAR | Status: AC
  Administered 2019-04-18: 4 mg via INTRAVENOUS
  Filled 2019-04-18: qty 2

## 2019-04-18 MED ORDER — PROPOFOL 10 MG/ML IV BOLUS
INTRAVENOUS | Status: AC
  Filled 2019-04-18: qty 40

## 2019-04-18 MED ORDER — METOCLOPRAMIDE HCL 5 MG/ML IJ SOLN: 5.0000 mg | Freq: Three times a day (TID) | INTRAMUSCULAR | Status: DC | PRN

## 2019-04-18 MED ORDER — SODIUM CHLORIDE 0.9 % IR SOLN
Status: DC | PRN
  Administered 2019-04-18: 3000 mL

## 2019-04-18 MED ORDER — HYDROCHLOROTHIAZIDE 25 MG PO TABS
25.0000 mg | ORAL_TABLET | Freq: Every day | ORAL | Status: DC
  Administered 2019-04-19: 25 mg via ORAL
  Filled 2019-04-18: qty 1

## 2019-04-18 MED ORDER — METOCLOPRAMIDE HCL 10 MG PO TABS: 5.0000 mg | ORAL_TABLET | Freq: Three times a day (TID) | ORAL | Status: DC | PRN

## 2019-04-18 MED ORDER — MIDAZOLAM HCL 5 MG/5ML IJ SOLN
INTRAMUSCULAR | Status: DC | PRN
  Administered 2019-04-18: 2 mg via INTRAVENOUS

## 2019-04-18 MED ORDER — BUPIVACAINE-EPINEPHRINE (PF) 0.5% -1:200000 IJ SOLN
INTRAMUSCULAR | Status: AC
  Filled 2019-04-18: qty 30

## 2019-04-18 MED ORDER — CELECOXIB 100 MG PO CAPS
200.0000 mg | ORAL_CAPSULE | Freq: Two times a day (BID) | ORAL | Status: DC
  Administered 2019-04-18 – 2019-04-19 (×2): 200 mg via ORAL
  Filled 2019-04-18 (×2): qty 2

## 2019-04-18 MED ORDER — ONDANSETRON HCL 4 MG/2ML IJ SOLN: 4.0000 mg | Freq: Four times a day (QID) | INTRAMUSCULAR | Status: DC | PRN

## 2019-04-18 MED ORDER — OXYCODONE HCL 5 MG PO TABS
5.0000 mg | ORAL_TABLET | ORAL | Status: DC | PRN
  Administered 2019-04-18 (×2): 10 mg via ORAL
  Administered 2019-04-19 (×2): 5 mg via ORAL
  Administered 2019-04-19: 10 mg via ORAL
  Filled 2019-04-18 (×2): qty 2
  Filled 2019-04-18: qty 1
  Filled 2019-04-18: qty 2
  Filled 2019-04-18: qty 1

## 2019-04-18 MED ORDER — METHOCARBAMOL 1000 MG/10ML IJ SOLN
500.0000 mg | Freq: Once | INTRAVENOUS | Status: AC
  Administered 2019-04-18: 11:00:00 500 mg via INTRAVENOUS
  Filled 2019-04-18: qty 5

## 2019-04-18 MED ORDER — LACTATED RINGERS IV SOLN: INTRAVENOUS | Status: DC

## 2019-04-18 MED ORDER — ONDANSETRON HCL 4 MG PO TABS: 4.0000 mg | ORAL_TABLET | Freq: Four times a day (QID) | ORAL | Status: DC | PRN

## 2019-04-18 SURGICAL SUPPLY — 75 items
ATTUNE PS FEM RT SZ 4 CEM KNEE (Femur) ×1 IMPLANT
BANDAGE ELASTIC 6 VELCRO NS (GAUZE/BANDAGES/DRESSINGS) ×1 IMPLANT
BANDAGE ESMARK 6X9 LF (GAUZE/BANDAGES/DRESSINGS) ×1 IMPLANT
BASEPLATE TIB CMT FB PCKT SZ4 (Stem) ×1 IMPLANT
BIT DRILL 3.2X128 (BIT) ×1 IMPLANT
BLADE HEX COATED 2.75 (ELECTRODE) ×2 IMPLANT
BLADE SAGITTAL 25.0X1.27X90 (BLADE) ×2 IMPLANT
BLADE SAW SGTL 11.0X1.19X90.0M (BLADE) ×1 IMPLANT
BNDG CMPR 9X6 STRL LF SNTH (GAUZE/BANDAGES/DRESSINGS) ×1
BNDG CMPR STD VLCR NS LF 5.8X4 (GAUZE/BANDAGES/DRESSINGS) ×2
BNDG CMPR STD VLCR NS LF 5.8X6 (GAUZE/BANDAGES/DRESSINGS) ×1
BNDG ELASTIC 4X5.8 VLCR NS LF (GAUZE/BANDAGES/DRESSINGS) ×6 IMPLANT
BNDG ELASTIC 6X5.8 VLCR NS LF (GAUZE/BANDAGES/DRESSINGS) ×3 IMPLANT
BNDG ESMARK 6X9 LF (GAUZE/BANDAGES/DRESSINGS) ×2
BSPLAT TIB 4 CMNT FX BRNG STRL (Stem) ×1 IMPLANT
CEMENT HV SMART SET (Cement) ×4 IMPLANT
CLOTH BEACON ORANGE TIMEOUT ST (SAFETY) ×2 IMPLANT
COOLER CRYO CUFF IC AND MOTOR (MISCELLANEOUS) ×2 IMPLANT
COVER LIGHT HANDLE STERIS (MISCELLANEOUS) ×4 IMPLANT
COVER WAND RF STERILE (DRAPES) ×2 IMPLANT
CUFF CRYO KNEE18X23 MED (MISCELLANEOUS) ×1 IMPLANT
CUFF TOURN SGL QUICK 34 (TOURNIQUET CUFF) ×2
CUFF TRNQT CYL 34X4.125X (TOURNIQUET CUFF) ×1 IMPLANT
DECANTER SPIKE VIAL GLASS SM (MISCELLANEOUS) ×2 IMPLANT
DRAPE BACK TABLE (DRAPES) ×2 IMPLANT
DRAPE EXTREMITY T 121X128X90 (DISPOSABLE) ×2 IMPLANT
DRSG MEPILEX BORDER 4X12 (GAUZE/BANDAGES/DRESSINGS) ×2 IMPLANT
DURAPREP 26ML APPLICATOR (WOUND CARE) ×4 IMPLANT
ELECT REM PT RETURN 9FT ADLT (ELECTROSURGICAL) ×2
ELECTRODE REM PT RTRN 9FT ADLT (ELECTROSURGICAL) ×1 IMPLANT
GLOVE BIO SURGEON STRL SZ7 (GLOVE) ×2 IMPLANT
GLOVE BIOGEL PI IND STRL 7.0 (GLOVE) ×2 IMPLANT
GLOVE BIOGEL PI INDICATOR 7.0 (GLOVE) ×4
GLOVE ECLIPSE 6.5 STRL STRAW (GLOVE) ×1 IMPLANT
GLOVE SKINSENSE NS SZ8.0 LF (GLOVE) ×2
GLOVE SKINSENSE STRL SZ8.0 LF (GLOVE) ×2 IMPLANT
GLOVE SS N UNI LF 8.5 STRL (GLOVE) ×2 IMPLANT
GOWN STRL REUS W/TWL LRG LVL3 (GOWN DISPOSABLE) ×6 IMPLANT
GOWN STRL REUS W/TWL XL LVL3 (GOWN DISPOSABLE) ×2 IMPLANT
HANDPIECE INTERPULSE COAX TIP (DISPOSABLE) ×2
HOOD W/PEELAWAY (MISCELLANEOUS) ×8 IMPLANT
INSERT TIB ATTUNE FB SZ4X5 (Insert) ×1 IMPLANT
INST SET MAJOR BONE (KITS) ×2 IMPLANT
IV NS IRRIG 3000ML ARTHROMATIC (IV SOLUTION) ×2 IMPLANT
KIT BLADEGUARD II DBL (SET/KITS/TRAYS/PACK) ×2 IMPLANT
KIT TURNOVER KIT A (KITS) ×2 IMPLANT
MANIFOLD NEPTUNE II (INSTRUMENTS) ×2 IMPLANT
MARKER SKIN DUAL TIP RULER LAB (MISCELLANEOUS) ×2 IMPLANT
NDL HYPO 18GX1.5 BLUNT FILL (NEEDLE) ×1 IMPLANT
NDL HYPO 21X1.5 SAFETY (NEEDLE) ×1 IMPLANT
NEEDLE HYPO 18GX1.5 BLUNT FILL (NEEDLE) ×2 IMPLANT
NEEDLE HYPO 21X1.5 SAFETY (NEEDLE) ×2 IMPLANT
NS IRRIG 1000ML POUR BTL (IV SOLUTION) ×2 IMPLANT
PACK TOTAL JOINT (CUSTOM PROCEDURE TRAY) ×2 IMPLANT
PAD ARMBOARD 7.5X6 YLW CONV (MISCELLANEOUS) ×2 IMPLANT
PATELLA MEDIAL ATTUN 35MM KNEE (Knees) ×1 IMPLANT
PENCIL SMOKE EVACUATOR (MISCELLANEOUS) ×1 IMPLANT
PILLOW KNEE EXTENSION 0 DEG (MISCELLANEOUS) ×2 IMPLANT
PIN THREADED HEADED SIGMA (PIN) ×1 IMPLANT
PIN/DRILL PACK ORTHO 1/8X3.0 (PIN) ×1 IMPLANT
SAW OSC TIP CART 19.5X105X1.3 (SAW) ×2 IMPLANT
SET BASIN LINEN APH (SET/KITS/TRAYS/PACK) ×2 IMPLANT
SET HNDPC FAN SPRY TIP SCT (DISPOSABLE) ×1 IMPLANT
STAPLER VISISTAT 35W (STAPLE) ×2 IMPLANT
SUT BRALON NAB BRD #1 30IN (SUTURE) ×2 IMPLANT
SUT MNCRL 0 VIOLET CTX 36 (SUTURE) ×1 IMPLANT
SUT MON AB 0 CT1 (SUTURE) ×2 IMPLANT
SUT MONOCRYL 0 CTX 36 (SUTURE) ×1
SYR 20ML LL LF (SYRINGE) ×6 IMPLANT
SYR BULB IRRIGATION 50ML (SYRINGE) ×2 IMPLANT
TOWEL OR 17X26 4PK STRL BLUE (TOWEL DISPOSABLE) ×2 IMPLANT
TOWER CARTRIDGE SMART MIX (DISPOSABLE) ×2 IMPLANT
TRAY FOLEY MTR SLVR 16FR STAT (SET/KITS/TRAYS/PACK) ×2 IMPLANT
WATER STERILE IRR 1000ML POUR (IV SOLUTION) ×4 IMPLANT
YANKAUER SUCT 12FT TUBE ARGYLE (SUCTIONS) ×2 IMPLANT

## 2019-04-18 NOTE — Progress Notes (Addendum)
Walked with PT and sat up in chair after surgery.  Back in bed now. Ice, bone foam and scds applied. Toes warm , no edema, color appropriate, no numbness.  Has been drinking well and now eating supper.  Has received prn pain meds. Vitals stable

## 2019-04-18 NOTE — Plan of Care (Signed)
  Problem: Acute Rehab PT Goals(only PT should resolve) Goal: Pt Will Go Supine/Side To Sit Outcome: Progressing Flowsheets (Taken 04/18/2019 1543) Pt will go Supine/Side to Sit: with modified independence Goal: Patient Will Perform Sitting Balance Outcome: Progressing Flowsheets (Taken 04/18/2019 1543) Patient will perform sitting balance: with modified independence Goal: Patient Will Transfer Sit To/From Stand Outcome: Progressing Flowsheets (Taken 04/18/2019 1543) Patient will transfer sit to/from stand: with modified independence Goal: Pt Will Transfer Bed To Chair/Chair To Bed Outcome: Progressing Flowsheets (Taken 04/18/2019 1543) Pt will Transfer Bed to Chair/Chair to Bed: with modified independence Goal: Pt Will Ambulate Outcome: Progressing Flowsheets (Taken 04/18/2019 1543) Pt will Ambulate:  100 feet  with supervision  with modified independence  with rolling walker   3:44 PM, 04/18/19 Ocie Bob, MPT Physical Therapist with [facility] [phone] office [phone] mobile phone

## 2019-04-18 NOTE — Interval H&P Note (Signed)
History and Physical Interval Note:  04/18/2019 7:24 AM  [name]  has presented today for surgery, with the diagnosis of right knee osteoarthritis.  The various methods of treatment have been discussed with the patient and family. After consideration of risks, benefits and other options for treatment, the patient has consented to  Procedure(s): RIGHT TOTAL KNEE ARTHROPLASTY (Right) as a surgical intervention.  The patient's history has been reviewed, patient examined, no change in status, stable for surgery.  I have reviewed the patient's chart and labs.  Questions were answered to the patient's satisfaction.     Fuller Canada

## 2019-04-18 NOTE — Anesthesia Procedure Notes (Signed)
Anesthesia Regional Block: Adductor canal block   Pre-Anesthetic Checklist: ,, timeout performed, Correct Patient, Correct Site, Correct Laterality, Correct Procedure, Correct Position, site marked, Risks and benefits discussed,  Surgical consent,  Pre-op evaluation,  At surgeon's request and post-op pain management  Laterality: Right  Prep: chloraprep       Needles:  Injection technique: Single-shot  Needle Type: Stimulator Needle - 40     Needle Length: 10cm  Needle Gauge: 20   Needle insertion depth: 6 cm   Additional Needles:   Procedures:,,,, ultrasound used (permanent image in chart),,,,   Nerve Stimulator or Paresthesia:  Response: Twitch elicited, 0.8 mA,   Additional Responses:   Narrative:  Start time: 04/18/2019 7:36 AM End time: 04/18/2019 7:45 AM Injection made incrementally with aspirations every 5 mL.  Performed by: Personally  Anesthesiologist: Dorena Cookey, MD  Additional Notes: Pt consented , +Q&A, plan Saphenous N. Block for POPM -PSR BP cuff, EKG monitors applied. 2 mg versed, 75 ucg fent given  27cc 0.5% Ropiv incrementally , No POI, neg aspirations. Tolerated well. Pt awake and conversant throughout. To OR after block awake -plan GETA

## 2019-04-18 NOTE — Transfer of Care (Signed)
Immediate Anesthesia Transfer of Care Note  Patient: [name]  Procedure(s) Performed: RIGHT TOTAL KNEE ARTHROPLASTY (Right Knee)  Patient Location: PACU  Anesthesia Type:General  Level of Consciousness: awake and alert   Airway & Oxygen Therapy: Patient Spontanous Breathing and Patient connected to nasal cannula oxygen  Post-op Assessment: Report given to RN  Post vital signs: Reviewed and stable  Last Vitals:  Vitals Value Taken Time  BP 142/67 04/18/19 1011  Temp    Pulse 80 04/18/19 1014  Resp 15 04/18/19 1014  SpO2 94 % 04/18/19 1014  Vitals shown include unvalidated device data.  Last Pain:  Vitals:   04/18/19 0657  PainSc: 5          Complications: No apparent anesthesia complications

## 2019-04-18 NOTE — Anesthesia Procedure Notes (Signed)
Procedure Name: Intubation Date/Time: 04/18/2019 7:57 AM Performed by: Ollen Bowl, CRNA Pre-anesthesia Checklist: Patient identified, Patient being monitored, Timeout performed, Emergency Drugs available and Suction available Patient Re-evaluated:Patient Re-evaluated prior to induction Oxygen Delivery Method: Circle system utilized Preoxygenation: Pre-oxygenation with 100% oxygen Induction Type: IV induction Ventilation: Mask ventilation without difficulty Laryngoscope Size: Mac and 3 Grade View: Grade I Tube type: Oral Tube size: 7.0 mm Number of attempts: 1 Airway Equipment and Method: Stylet Placement Confirmation: ETT inserted through vocal cords under direct vision,  positive ETCO2 and breath sounds checked- equal and bilateral Secured at: 21 cm Tube secured with: Tape Dental Injury: Teeth and Oropharynx as per pre-operative assessment

## 2019-04-18 NOTE — Addendum Note (Signed)
Addendum  created 04/18/19 1407 by Franco Nones, CRNA   Charge Capture section accepted

## 2019-04-18 NOTE — Progress Notes (Signed)
Called report to [facility] on dept 300. Stated she will take report for Nancy,Rn who is in a room at this time. Patient to be transported to room 308.

## 2019-04-18 NOTE — Brief Op Note (Addendum)
04/18/2019  10:06 AM  PATIENT:  [name]  59 y.o. female  PRE-OPERATIVE DIAGNOSIS:  right knee osteoarthritis  POST-OPERATIVE DIAGNOSIS:  right knee osteoarthritis  PROCEDURE:  Procedure(s): RIGHT TOTAL KNEE ARTHROPLASTY (Right)   Attune DePuy total knee Size 4 femur Size 4 tibia Size 5 polyethylene insert Size 35 patella  Distal femoral cut 10 mm Proximal tibial cut 9 mm from the medial side 11 mm of bone was removed from the patella  Findings severe arthritis of the right knee varus deformity was moderate, patient had peripheral osteophytes that were large and significant in the posterior femur posterior tibia medial tibia and patella  She had complete wear of the medial compartment femoral and tibial side and complete wear of the medial patellar facet  Assisted by Valetta Close and Canary Brim  General anesthesia with saphenous nerve block  SURGEON:  Surgeon(s) and Role:    Vickki Hearing, MD - Primary  EBL:  30 mL   BLOOD ADMINISTERED:none  DRAINS: none   LOCAL MEDICATIONS USED: Full-strength Exparel 20 cc and 20 cc of Marcaine with epinephrine   SPECIMEN:  No Specimen  DISPOSITION OF SPECIMEN:  N/A  COUNTS:  YES  TOURNIQUET:   Total Tourniquet Time Documented: Thigh (Right) - 95 minutes Total: Thigh (Right) - 95 minutes   DICTATION: .Reubin Milan Dictation  PLAN OF CARE: Admit for overnight observation  PATIENT DISPOSITION:  PACU - hemodynamically stable.   Delay start of Pharmacological VTE agent (>24hrs) due to surgical blood loss or risk of bleeding: not applicable  Operative report for a right total knee arthroplasty  Surgeon Romeo Apple [phone]   Antibiotic: 2 g Ancef    The patient was identified in the preop holding area and the surgical site was confirmed as the right knee. Chart review and update were completed. The patient was taken to the operating room for general anesthesia after saphenous nerve block after successful  spinal anesthesia Foley catheter was inserted. The patient was placed supine on the operating table.   the right leg was prepped with DuraPrep and draped sterilely. Timeout was completed. The limb was then exsanguinated a  6 inch Esmarch. The tourniquet was elevated to 300 mmHg.   A midline incision was made and taken down to the extensor mechanism followed by medial arthrotomy. The patella was everted. A synovectomy was performed as needed. The osteophytes were resected.  Anterior cruciate ligament and PCL and medial and lateral meniscus were resected.   a 3/8 inch drill bit was used to enter the femoral canal which was suctioned and irrigated until the fluid was clear. The distal femoral cut was set for 10 millimeter resection with a 5  Right Valgus angle. This cut was completed and checked for flatness.   the femur was then measured to a size 4.    the tibia was subluxated forward and the external alignment guide was placed. We removed 9 mm of bone from the higher medial side. We set the guide for neutral varus valgus cut related to the  Mechanical axis of the tibia and for slope matching the patient's anatomy. Rotational alignment was set using the tibial tubercle, tibial spine and second metatarsal. The cutting block was pinned and the proximal tibia was resected.   A spacer block was placed starting with a 5 mm insert to check the extension gap.  The gap was symmetric and stable   The 4-in-1 femoral cutting block was placed to match the femoral epicondyles and the  4 distal cuts were made.   A spacer block was placed starting with a 5 mm insert to check the flexion and extension gap. The 5 spacer block confirmed proper balance in flexion and extension.   We placed the femoral notch cutting guide size 4  and resected the notch.   Trial implants were placed using appropriate size femur , appropriate size tibial baseplate which was measured after the proximal tibia resection. Tibial rotation  was set patella tracking was normal   The tibia was then punched per manufacture technique making sure to avoid internal rotation.  We drilled several holes in the medial tibial plateau to aid in cement fixation   The patella measured a size 21 mm   we resected down to a size 11 mm using a size 35 button. Total patella thickness was 20.5   Final range of motion check was performed with the appropriate size trials as mentioned above. Satisfactory reduction and motion were obtained.   Trial implants were removed. The bone was irrigated and dried and the cement was mixed on the back table  exparel was injected in the soft tissues   These implants were then cemented in place. Excess cement was removed. The cement was allowed to cure. Second irrigation was performed.    FInal range of motion check and stability check was completed.  The preop range of motion was 10-85 the postop range of motion was 0-120 passive flexion test  The wound was irrigated a third time the extensor mechanism was closed with interrupted #1 Nurolon followed by 0 Monocryl and staples   Sterile dressing and cryocuff  was applied  The patient was taken recovery in stable condition

## 2019-04-18 NOTE — Op Note (Signed)
04/18/2019  10:06 AM  PATIENT:  [name]  59 y.o. female  PRE-OPERATIVE DIAGNOSIS:  right knee osteoarthritis  POST-OPERATIVE DIAGNOSIS:  right knee osteoarthritis  PROCEDURE:  Procedure(s): RIGHT TOTAL KNEE ARTHROPLASTY (Right)   Attune DePuy total knee Size 4 femur Size 4 tibia Size 5 polyethylene insert Size 35 patella  Distal femoral cut 10 mm Proximal tibial cut 9 mm from the medial side 11 mm of bone was removed from the patella  Findings severe arthritis of the right knee varus deformity was moderate, patient had peripheral osteophytes that were large and significant in the posterior femur posterior tibia medial tibia and patella  She had complete wear of the medial compartment femoral and tibial side and complete wear of the medial patellar facet  Assisted by Valetta Close and Canary Brim  General anesthesia with saphenous nerve block  SURGEON:  Surgeon(s) and Role:    Vickki Hearing, MD - Primary  EBL:  30 mL   BLOOD ADMINISTERED:none  DRAINS: none   LOCAL MEDICATIONS USED: Full-strength Exparel 20 cc and 20 cc of Marcaine with epinephrine   SPECIMEN:  No Specimen  DISPOSITION OF SPECIMEN:  N/A  COUNTS:  YES  TOURNIQUET:   Total Tourniquet Time Documented: Thigh (Right) - 95 minutes Total: Thigh (Right) - 95 minutes   DICTATION: .Reubin Milan Dictation  PLAN OF CARE: Admit for overnight observation  PATIENT DISPOSITION:  PACU - hemodynamically stable.   Delay start of Pharmacological VTE agent (>24hrs) due to surgical blood loss or risk of bleeding: not applicable  Operative report for a right total knee arthroplasty  Surgeon Romeo Apple [phone]   Antibiotic: 2 g Ancef    The patient was identified in the preop holding area and the surgical site was confirmed as the right knee. Chart review and update were completed. The patient was taken to the operating room for general anesthesia after saphenous nerve block after successful  spinal anesthesia Foley catheter was inserted. The patient was placed supine on the operating table.   the right leg was prepped with DuraPrep and draped sterilely. Timeout was completed. The limb was then exsanguinated a  6 inch Esmarch. The tourniquet was elevated to 300 mmHg.   A midline incision was made and taken down to the extensor mechanism followed by medial arthrotomy. The patella was everted. A synovectomy was performed as needed. The osteophytes were resected.  Anterior cruciate ligament and PCL and medial and lateral meniscus were resected.  The dissection was carried to the posterior medial corner of the knee due to extensive varus deformity.   a 3/8 inch drill bit was used to enter the femoral canal which was suctioned and irrigated until the fluid was clear. The distal femoral cut was set for 10 millimeter resection with a 5  Right Valgus angle. This cut was completed and checked for flatness.   the femur was then measured to a size 4.    the tibia was subluxated forward and the external alignment guide was placed. We removed 9 mm of bone from the higher medial side. We set the guide for neutral varus valgus cut related to the  Mechanical axis of the tibia and for slope matching the patient's anatomy. Rotational alignment was set using the tibial tubercle, tibial spine and second metatarsal. The cutting block was pinned and the proximal tibia was resected.   A spacer block was placed starting with a 5 mm insert to check the extension gap.  The gap was symmetric  and stable   The 4-in-1 femoral cutting block was placed to match the femoral epicondyles and the 4 distal cuts were made.   A spacer block was placed starting with a 5 mm insert to check the flexion and extension gap. The 5 spacer block confirmed proper balance in flexion and extension.   We placed the femoral notch cutting guide size 4  and resected the notch.   Trial implants were placed using appropriate size femur ,  appropriate size tibial baseplate which was measured after the proximal tibia resection. Tibial rotation was set patella tracking was normal   The tibia was then punched per manufacture technique making sure to avoid internal rotation.  We drilled several holes in the medial tibial plateau to aid in cement fixation   The patella measured a size 21 mm   we resected down to a size 11 mm using a size 35 button. Total patella thickness was 20.5   Final range of motion check was performed with the appropriate size trials as mentioned above. Satisfactory reduction and motion were obtained.   Trial implants were removed. The bone was irrigated and dried and the cement was mixed on the back table  exparel was injected in the soft tissues   These implants were then cemented in place. Excess cement was removed. The cement was allowed to cure. Second irrigation was performed.    FInal range of motion check and stability check was completed.  The preop range of motion was 10-85 the postop range of motion was 0-120 passive flexion test  The wound was irrigated a third time the extensor mechanism was closed with interrupted #1 Nurolon followed by 0 Monocryl and staples   Sterile dressing and cryocuff  was applied  The patient was taken recovery in stable condition

## 2019-04-18 NOTE — Anesthesia Postprocedure Evaluation (Signed)
Anesthesia Post Note  Patient: [name]  Procedure(s) Performed: RIGHT TOTAL KNEE ARTHROPLASTY (Right Knee)  Patient location during evaluation: PACU Anesthesia Type: General Level of consciousness: awake and alert and oriented Pain management: pain level controlled Vital Signs Assessment: post-procedure vital signs reviewed and stable Respiratory status: spontaneous breathing Cardiovascular status: blood pressure returned to baseline and stable Postop Assessment: no apparent nausea or vomiting Anesthetic complications: no     Last Vitals:  Vitals:   04/18/19 1115 04/18/19 1130  BP: (!) 122/54 118/63  Pulse: 63 63  Resp: 14 14  Temp: 36.6 C   SpO2: 100% 97%    Last Pain:  Vitals:   04/18/19 1130  PainSc: Asleep                 Ante Arredondo

## 2019-04-18 NOTE — Progress Notes (Signed)
0736-Time out completed for right knee nerve block.  0737-Right knee nerve block started per Dr Lemont Fillers. 0745-Right knee nerve block completed. Tolerated well.

## 2019-04-18 NOTE — Evaluation (Signed)
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  Clinical Impression  Patient demonstrates labored movement for sitting up at bedside requiring use of bed rail, demonstrates good return for completing terminal knee extensions while supine in bed able to lift against gravity, slow labored cadence with fair tolerance for heel to toe stepping due to increasing right knee pain and tolerated sitting up in chair with RLE dangling after therapy - RN present in room.  Patient will benefit from continued physical therapy in hospital and recommended venue below to increase strength, balance, endurance for safe ADLs and gait.    Follow Up Recommendations Home health PT;Supervision for mobility/OOB;Supervision - Intermittent    Equipment Recommendations  Rolling walker with 5" wheels    Recommendations for Other Services       Precautions / Restrictions Precautions Precautions: Fall Restrictions Weight Bearing Restrictions: Yes RLE Weight Bearing: Weight bearing as tolerated      Mobility  Bed Mobility Overal bed mobility: Needs Assistance Bed Mobility: Supine to Sit     Supine to sit: Supervision;Min guard     General bed mobility comments: increased time, labored movement  Transfers Overall transfer level: Needs assistance Equipment used: Rolling walker (2 wheeled) Transfers: Sit to/from [facility] Sit to Stand: Supervision;Min guard Stand pivot transfers: Supervision;Min guard       General transfer comment: difficulty for sit to stands due to right pain  Ambulation/Gait Ambulation/Gait assistance: Supervision;Min guard Gait Distance  (Feet): 65 Feet Assistive device: Rolling walker (2 wheeled) Gait Pattern/deviations: Decreased step length - right;Decreased stance time - right;Decreased stride length;Antalgic Gait velocity: decreased   General Gait Details: slow labored cadence with fair return for right heel to toe stepping due to increasing right knee pain, no loss of balance, limited secondary to fatigue/right knee pain  Stairs            Wheelchair Mobility    Modified Rankin (Stroke Patients Only)       Balance Overall balance assessment: Needs assistance Sitting-balance support: Feet supported;No upper extremity supported Sitting balance-Leahy Scale: Good Sitting balance - Comments: seated at EOB   Standing balance support: During functional activity;Bilateral upper extremity supported Standing balance-Leahy Scale: Fair Standing balance comment: using RW                             Pertinent Vitals/Pain Pain Assessment: 0-10 Pain Score: 8  Pain Location: right knee with movement Pain Descriptors / Indicators: Sore;Guarding;Grimacing Pain Intervention(s): Limited activity within patient's tolerance;Monitored during session;RN gave pain meds during session    [place] expects to be discharged to:: Private residence Living Arrangements: Spouse/significant other Available Help at Discharge: Family;Available 24 hours/day Type of Home: House Home Access: Stairs to enter Entrance Stairs-Rails: (to wide to reach both) Entrance Stairs-Number of Steps: 3 Home Layout: Two level;Able to live on main level with bedroom/bathroom;Full bath on main level Home Equipment: Walker - 4 wheels;Shower seat      Prior Function Level of Independence: Independent         Comments: Household and short distanced community ambulator, drives     Journalist, newspaper  Extremity/Trunk Assessment   Upper Extremity Assessment Upper Extremity Assessment: Overall WFL for tasks  assessed    Lower Extremity Assessment Lower Extremity Assessment: Overall WFL for tasks assessed;RLE deficits/detail RLE Deficits / Details: grossly -4/5 RLE: Unable to fully assess due to pain RLE Sensation: WNL RLE Coordination: WNL    Cervical / Trunk Assessment Cervical / Trunk Assessment: Normal  Communication   Communication: No difficulties  Cognition Arousal/Alertness: Awake/alert Behavior During Therapy: WFL for tasks assessed/performed Overall Cognitive Status: Within Functional Limits for tasks assessed                                        General Comments      Exercises Total Joint Exercises Ankle Circles/Pumps: Supine;5 reps;Right;Strengthening;AROM Quad Sets: Supine;5 reps;Right;Strengthening;AROM Gluteal Sets: Supine;5 reps;Both;Strengthening;AROM Short Arc Quad: Supine;10 reps;Right;Strengthening;AROM Heel Slides: Supine;10 reps;Right;Strengthening;AROM Goniometric ROM: right knee: 0-75 degrees   Assessment/Plan    PT Assessment Patient needs continued PT services  PT Problem List Decreased strength;Decreased activity tolerance;Decreased range of motion;Decreased balance;Decreased mobility       PT Treatment Interventions Balance training;Gait training;Stair training;Functional mobility training;Therapeutic activities;Patient/family education;Therapeutic exercise    PT Goals (Current goals can be found in the Care Plan section)  Acute Rehab PT Goals Patient Stated Goal: return home with family to assist PT Goal Formulation: With patient Time For Goal Achievement: 04/21/19 Potential to Achieve Goals: Good    Frequency BID   Barriers to discharge        Co-evaluation               AM-PAC PT "6 Clicks" Mobility  Outcome Measure Help needed turning from your back to your side while in a flat bed without using bedrails?: None Help needed moving from lying on your back to sitting on the side of a flat bed without using  bedrails?: A Little Help needed moving to and from a bed to a chair (including a wheelchair)?: A Little Help needed standing up from a chair using your arms (e.g., wheelchair or bedside chair)?: A Little Help needed to walk in hospital room?: A Little Help needed climbing 3-5 steps with a railing? : A Lot 6 Click Score: 18    End of Session   Activity Tolerance: Patient tolerated treatment well;Patient limited by fatigue;Patient limited by pain Patient left: in chair;with call bell/phone within reach;with nursing/sitter in room Nurse Communication: Mobility status PT Visit Diagnosis: Unsteadiness on feet (R26.81);Other abnormalities of gait and mobility (R26.89);Muscle weakness (generalized) (M62.81)    Time: 2353-6144 PT Time Calculation (min) (ACUTE ONLY): 33 min   Charges:   PT Evaluation $PT Eval Moderate Complexity: 1 Mod PT Treatments $Therapeutic Activity: 23-37 mins        3:42 PM, 04/18/19 Ocie Bob, MPT Physical Therapist with [facility] [phone] office [phone] mobile phone

## 2019-04-18 NOTE — Anesthesia Preprocedure Evaluation (Signed)
Anesthesia Evaluation  Patient identified by MRN, date of birth, ID band Patient awake    Reviewed: Allergy & Precautions, NPO status , Patient's Chart, lab work & pertinent test results  Airway Mallampati: II  TM Distance: >3 FB Neck ROM: Full    Dental no notable dental hx. (+) Teeth Intact   Pulmonary neg pulmonary ROS,    Pulmonary exam normal breath sounds clear to auscultation       Cardiovascular Exercise Tolerance: Good hypertension, negative cardio ROS Normal cardiovascular examI Rhythm:Regular Rate:Normal     Neuro/Psych Anxiety negative neurological ROS  negative psych ROS   GI/Hepatic Neg liver ROS, GERD  Medicated and Controlled,  Endo/Other  negative endocrine ROS  Renal/GU negative Renal ROS  negative genitourinary   Musculoskeletal  (+) Arthritis , Osteoarthritis,    Abdominal   Peds negative pediatric ROS (+)  Hematology negative hematology ROS (+)   Anesthesia Other Findings   Reproductive/Obstetrics negative OB ROS                             Anesthesia Physical Anesthesia Plan  ASA: II  Anesthesia Plan: General   Post-op Pain Management:  Regional for Post-op pain   Induction: Intravenous  PONV Risk Score and Plan: 3 and Ondansetron, Dexamethasone, Midazolam and Treatment may vary due to age or medical condition  Airway Management Planned: Oral ETT  Additional Equipment:   Intra-op Plan:   Post-operative Plan: Extubation in OR  Informed Consent: I have reviewed the patients History and Physical, chart, labs and discussed the procedure including the risks, benefits and alternatives for the proposed anesthesia with the patient or authorized representative who has indicated his/her understanding and acceptance.     Dental advisory given  Plan Discussed with: CRNA  Anesthesia Plan Comments: (Plan Full PPE use Plan GETA D/W PT -WTP with same after  Q&A  Offered SAB -WTP with GETA  Also offered R S[employer] for POPM-PSR - WTP with same in addition to GETA)        Anesthesia Quick Evaluation

## 2019-04-19 DIAGNOSIS — Z79899 Other long term (current) drug therapy: Secondary | ICD-10-CM | POA: Diagnosis not present

## 2019-04-19 DIAGNOSIS — M1711 Unilateral primary osteoarthritis, right knee: Secondary | ICD-10-CM | POA: Diagnosis not present

## 2019-04-19 DIAGNOSIS — Z96651 Presence of right artificial knee joint: Secondary | ICD-10-CM | POA: Diagnosis not present

## 2019-04-19 DIAGNOSIS — M1990 Unspecified osteoarthritis, unspecified site: Secondary | ICD-10-CM | POA: Diagnosis not present

## 2019-04-19 DIAGNOSIS — I10 Essential (primary) hypertension: Secondary | ICD-10-CM | POA: Diagnosis not present

## 2019-04-19 DIAGNOSIS — K219 Gastro-esophageal reflux disease without esophagitis: Secondary | ICD-10-CM | POA: Diagnosis not present

## 2019-04-19 DIAGNOSIS — Z9049 Acquired absence of other specified parts of digestive tract: Secondary | ICD-10-CM | POA: Diagnosis not present

## 2019-04-19 DIAGNOSIS — Z800 Family history of malignant neoplasm of digestive organs: Secondary | ICD-10-CM | POA: Diagnosis not present

## 2019-04-19 DIAGNOSIS — F419 Anxiety disorder, unspecified: Secondary | ICD-10-CM | POA: Diagnosis not present

## 2019-04-19 DIAGNOSIS — Z885 Allergy status to narcotic agent status: Secondary | ICD-10-CM | POA: Diagnosis not present

## 2019-04-19 LAB — CBC
HCT: 32.5 % — ABNORMAL LOW (ref 36.0–46.0)
Hemoglobin: 10.4 g/dL — ABNORMAL LOW (ref 12.0–15.0)
MCH: 29.5 pg (ref 26.0–34.0)
MCHC: 32 g/dL (ref 30.0–36.0)
MCV: 92.1 fL (ref 80.0–100.0)
Platelets: 235 10*3/uL (ref 150–400)
RBC: 3.53 MIL/uL — ABNORMAL LOW (ref 3.87–5.11)
RDW: 12.7 % (ref 11.5–15.5)
WBC: 12 10*3/uL — ABNORMAL HIGH (ref 4.0–10.5)
nRBC: 0 % (ref 0.0–0.2)

## 2019-04-19 LAB — BASIC METABOLIC PANEL
Anion gap: 8 (ref 5–15)
BUN: 15 mg/dL (ref 6–20)
CO2: 24 mmol/L (ref 22–32)
Calcium: 8.4 mg/dL — ABNORMAL LOW (ref 8.9–10.3)
Chloride: 107 mmol/L (ref 98–111)
Creatinine, Ser: 0.54 mg/dL (ref 0.44–1.00)
GFR calc Af Amer: >60 mL/min (ref >60)
GFR calc non Af Amer: >60 mL/min (ref >60)
Glucose, Bld: 128 mg/dL — ABNORMAL HIGH (ref 70–99)
Potassium: 4 mmol/L (ref 3.5–5.1)
Sodium: 139 mmol/L (ref 135–145)

## 2019-04-19 MED ORDER — DOCUSATE SODIUM 100 MG PO CAPS: 100.0000 mg | ORAL_CAPSULE | Freq: Two times a day (BID) | ORAL | 0 refills | Status: DC

## 2019-04-19 MED ORDER — METHOCARBAMOL 500 MG PO TABS: 500.0000 mg | ORAL_TABLET | Freq: Four times a day (QID) | ORAL | 1 refills | Status: DC | PRN

## 2019-04-19 MED ORDER — BISACODYL 5 MG PO TBEC: 5.0000 mg | DELAYED_RELEASE_TABLET | Freq: Every day | ORAL | 0 refills | Status: DC | PRN

## 2019-04-19 MED ORDER — OXYCODONE HCL 5 MG PO TABS: 5.0000 mg | ORAL_TABLET | ORAL | 0 refills | Status: DC | PRN

## 2019-04-19 MED ORDER — GABAPENTIN 300 MG PO CAPS: 300.0000 mg | ORAL_CAPSULE | Freq: Three times a day (TID) | ORAL | 0 refills | Status: DC

## 2019-04-19 MED ORDER — ASPIRIN 81 MG PO CHEW: 81.0000 mg | CHEWABLE_TABLET | Freq: Two times a day (BID) | ORAL | 0 refills | Status: DC

## 2019-04-19 NOTE — Progress Notes (Signed)
Physical Therapy Treatment Patient Details Name: [name] MRN: [id number] DOB: 1960-08-13 Today's Date: 04/19/2019  RIGHT KNEE ROM:  0 - 80 degrees AMBULATION DISTANCE: 130 feet using RW with Mod Indep/Supervision   History of Present Illness [name] is a 60 y/o female, s/p Right TKA on 04/18/19  with the diagnosis of right knee osteoarthritis.    PT Comments    Patient demonstrates increased endurance/distance for gait training with improvement for tolerating weightbearing on RLE with fair/good return for right heel to toe stepping, no loss of balance, limited secondary to fatigue and tolerated CPM at 0-70 degrees after therapy.  Patient will benefit from continued physical therapy in hospital and recommended venue below to increase strength, balance, endurance for safe ADLs and gait.   Follow Up Recommendations  Home health PT;Supervision for mobility/OOB;Supervision - Intermittent     Equipment Recommendations  Rolling walker with 5" wheels    Recommendations for Other Services       Precautions / Restrictions Precautions Precautions: Fall Restrictions Weight Bearing Restrictions: No RLE Weight Bearing: Weight bearing as tolerated    Mobility  Bed Mobility Overal bed mobility: Modified Independent Bed Mobility: Supine to Sit           General bed mobility comments: demonstrates improvement for propping up onto elbows to hands without use of bed rail  Transfers Overall transfer level: Needs assistance Equipment used: Rolling walker (2 wheeled) Transfers: Sit to/from [facility] Sit to Stand: Modified independent (Device/Increase time);Supervision Stand pivot transfers: Modified independent (Device/Increase time);Supervision       General transfer comment: good return for proper hand placement during sit to stands  Ambulation/Gait Ambulation/Gait assistance: Modified independent (Device/Increase time);Supervision Gait Distance  (Feet): 130 Feet Assistive device: Rolling walker (2 wheeled) Gait Pattern/deviations: Decreased step length - right;Decreased stance time - right;Decreased stride length;Antalgic Gait velocity: decreased   General Gait Details: demonstrates increased endurance/distance with slightly labored cadence with fair/good return for right heel to toe stepping without loss of balance, limited secondary to fatigue   Stairs             Wheelchair Mobility    Modified Rankin (Stroke Patients Only)       Balance Overall balance assessment: Needs assistance Sitting-balance support: Feet supported;No upper extremity supported Sitting balance-Leahy Scale: Good Sitting balance - Comments: seated at EOB   Standing balance support: During functional activity;Bilateral upper extremity supported Standing balance-Leahy Scale: Fair Standing balance comment: fair/good using RW                            Cognition Arousal/Alertness: Awake/alert Behavior During Therapy: WFL for tasks assessed/performed Overall Cognitive Status: Within Functional Limits for tasks assessed                                        Exercises Total Joint Exercises Ankle Circles/Pumps: Supine;Right;Strengthening;AROM;10 reps Quad Sets: Supine;Right;Strengthening;AROM;10 reps Short Arc Quad: Supine;10 reps;Right;Strengthening;AROM Heel Slides: Supine;10 reps;Right;Strengthening;AROM    General Comments        Pertinent Vitals/Pain Pain Assessment: 0-10 Pain Score: 3  Pain Location: right knee Pain Descriptors / Indicators: Sore Pain Intervention(s): Limited activity within patient's tolerance;Monitored during session;Premedicated before session    Home Living                      Prior  Function            PT Goals (current goals can now be found in the care plan section) Acute Rehab PT Goals Patient Stated Goal: return home with family to assist PT Goal  Formulation: With patient Time For Goal Achievement: 04/21/19 Potential to Achieve Goals: Good Progress towards PT goals: Progressing toward goals    Frequency    BID      PT Plan Current plan remains appropriate    Co-evaluation              AM-PAC PT "6 Clicks" Mobility   Outcome Measure  Help needed turning from your back to your side while in a flat bed without using bedrails?: None Help needed moving from lying on your back to sitting on the side of a flat bed without using bedrails?: None Help needed moving to and from a bed to a chair (including a wheelchair)?: A Little Help needed standing up from a chair using your arms (e.g., wheelchair or bedside chair)?: None Help needed to walk in hospital room?: A Little Help needed climbing 3-5 steps with a railing? : A Lot 6 Click Score: 20    End of Session   Activity Tolerance: Patient tolerated treatment well;Patient limited by fatigue;Patient limited by pain Patient left: in bed;in CPM;with call bell/phone within reach Nurse Communication: Mobility status PT Visit Diagnosis: Unsteadiness on feet (R26.81);Other abnormalities of gait and mobility (R26.89);Muscle weakness (generalized) (M62.81)     Time: 1610-9604 PT Time Calculation (min) (ACUTE ONLY): 31 min  Charges:  $Gait Training: 8-22 mins $Therapeutic Exercise: 8-22 mins                     12:31 PM, 04/19/19 Ocie Bob, MPT Physical Therapist with [facility] [phone] office [phone] mobile phone

## 2019-04-19 NOTE — Progress Notes (Signed)
Physical Therapy Treatment Patient Details Name: [name] MRN: [id number] DOB: 11-04-1960 Today's Date: 04/19/2019  RIGHT KNEE ROM:  0-80 degrees AMBULATION DISTANCE: 15 feet using RW with Mod Indep/supervision    History of Present Illness [name] is a 59 y/o female, s/p Right TKA on 04/18/19  with the diagnosis of right knee osteoarthritis.    PT Comments    Limited patient's ambulation to room secondary to focusing on stair training.  Patient demonstrates labored movement having to hold onto 1 side rail with both hands with step to pattern going up/down steps.  Patient instructed in proper sequence and position of helper with good return demonstrated and understanding acknowldged.  Patient will benefit from continued physical therapy in hospital and recommended venue below to increase strength, balance, endurance for safe ADLs and gait.   Follow Up Recommendations  Home health PT;Supervision for mobility/OOB;Supervision - Intermittent     Equipment Recommendations  Rolling walker with 5" wheels    Recommendations for Other Services       Precautions / Restrictions Precautions Precautions: Fall Restrictions Weight Bearing Restrictions: Yes RLE Weight Bearing: Weight bearing as tolerated    Mobility  Bed Mobility Overal bed mobility: Modified Independent Bed Mobility: Supine to Sit           General bed mobility comments: demonstrates good return for sitting up at bedside  Transfers Overall transfer level: Modified independent Equipment used: Rolling walker (2 wheeled) Transfers: Sit to/from [employer] Sit to Stand: Modified independent (Device/Increase time);Supervision Stand pivot transfers: Modified independent (Device/Increase time);Supervision       General transfer comment: good return for proper hand placement during sit to stands  Ambulation/Gait Ambulation/Gait assistance: Modified independent (Device/Increase  time);Supervision Gait Distance (Feet): 15 Feet Assistive device: Rolling walker (2 wheeled) Gait Pattern/deviations: Decreased step length - right;Decreased stance time - right;Decreased stride length;Antalgic Gait velocity: decreased   General Gait Details: limited patient to a few steps in room to not wear out before stair training   Stairs Stairs: Yes Stairs assistance: Min assist Stair Management: One rail Right;One rail Left;Step to pattern Number of Stairs: 3 General stair comments: Patient demonstrates slow labored movemnt having to use both hands to hold onto side rail while going up/down steps in stairwell with Min assist   Wheelchair Mobility    Modified Rankin (Stroke Patients Only)       Balance Overall balance assessment: Needs assistance Sitting-balance support: Feet supported;No upper extremity supported Sitting balance-Leahy Scale: Good Sitting balance - Comments: seated at EOB   Standing balance support: During functional activity;Bilateral upper extremity supported Standing balance-Leahy Scale: Fair Standing balance comment: fair/good using RW                            Cognition Arousal/Alertness: Awake/alert Behavior During Therapy: WFL for tasks assessed/performed Overall Cognitive Status: Within Functional Limits for tasks assessed                                        Exercises Total Joint Exercises Ankle Circles/Pumps: Supine;Right;Strengthening;AROM;10 reps Quad Sets: Supine;Right;Strengthening;AROM;10 reps Short Arc Quad: Supine;10 reps;Right;Strengthening;AROM Heel Slides: Supine;10 reps;Right;Strengthening;AROM    General Comments        Pertinent Vitals/Pain Pain Assessment: Faces Pain Score: 3  Faces Pain Scale: Hurts even more Pain Location: right knee Pain Descriptors / Indicators: Sore Pain  Intervention(s): Limited activity within patient's tolerance;Monitored during session;Repositioned;Patient  requesting pain meds-RN notified    Home Living                      Prior Function            PT Goals (current goals can now be found in the care plan section) Acute Rehab PT Goals Patient Stated Goal: return home with family to assist PT Goal Formulation: With patient Time For Goal Achievement: 04/21/19 Potential to Achieve Goals: Good Progress towards PT goals: Progressing toward goals    Frequency    BID      PT Plan Current plan remains appropriate    Co-evaluation              AM-PAC PT "6 Clicks" Mobility   Outcome Measure  Help needed turning from your back to your side while in a flat bed without using bedrails?: None Help needed moving from lying on your back to sitting on the side of a flat bed without using bedrails?: None Help needed moving to and from a bed to a chair (including a wheelchair)?: None Help needed standing up from a chair using your arms (e.g., wheelchair or bedside chair)?: None Help needed to walk in hospital room?: A Little Help needed climbing 3-5 steps with a railing? : A Lot 6 Click Score: 21    End of Session   Activity Tolerance: Patient tolerated treatment well;Patient limited by fatigue;Patient limited by pain Patient left: in chair;with call bell/phone within reach Nurse Communication: Mobility status PT Visit Diagnosis: Unsteadiness on feet (R26.81);Other abnormalities of gait and mobility (R26.89);Muscle weakness (generalized) (M62.81)     Time: 6195-0932 PT Time Calculation (min) (ACUTE ONLY): 17 min  Charges:  $Gait Training: 8-22 mins $Therapeutic Exercise: 8-22 mins                     3:45 PM, 04/19/19 Lonell Grandchild, MPT Physical Therapist with [facility] 336 [phone] office [phone] mobile phone

## 2019-04-19 NOTE — Care Management (Signed)
Patient discharging today. Referred to [facility] for home health. DME will be delivered to room by Olegario Messier with [employer].

## 2019-04-19 NOTE — Discharge Summary (Signed)
Physician Discharge Summary  Patient ID: [name] MRN: [id number] DOB/AGE: Jul 12, 1960 59 y.o.  Admit date: 04/18/2019 Discharge date: 04/19/2019  Admission Diagnoses: Osteoarthritis right knee  Discharge Diagnoses: Same  Discharged Condition: good  Procedure: Right total knee DePuy attune posterior stabilized total knee size 4 femur size 4 tibia sized 5 insert   Hospital Course: Admission on February 23 right total knee with saphenous nerve block general anesthesia no complications  Completed physical therapy and discharged home on February 24  CBC Latest Ref Rng & Units 04/19/2019 04/17/2019 08/18/2016  WBC 4.0 - 10.5 K/uL 12.0(H) 7.3 9.2  Hemoglobin 12.0 - 15.0 g/dL 10.4(L) 12.5 13.4  Hematocrit 36.0 - 46.0 % 32.5(L) 38.6 39.2  Platelets 150 - 400 K/uL 235 273 296   BMP Latest Ref Rng & Units 04/19/2019 04/17/2019 08/18/2016  Glucose 70 - 99 mg/dL 128(H) 101(H) 120(H)  BUN 6 - 20 mg/dL 15 17 17   Creatinine 0.44 - 1.00 mg/dL 0.54 0.54 0.52  Sodium 135 - 145 mmol/L 139 138 136  Potassium 3.5 - 5.1 mmol/L 4.0 3.6 3.6  Chloride 98 - 111 mmol/L 107 106 104  CO2 22 - 32 mmol/L 24 25 23   Calcium 8.9 - 10.3 mg/dL 8.4(L) 8.7(L) 9.1     Discharge Exam: BP (!) 116/55 (BP Location: Right Arm)   Pulse 60   Temp (!) 97.4 F (36.3 C) (Oral)   Resp 18   Ht 5\' 1"  (1.549 m)   Wt 96.2 kg   SpO2 99%   BMI 40.07 kg/m  Physical Exam  Awake alert oriented pain controlled wound clean calf soft supple negative Homans' sign  Disposition:   Discharge Instructions    Ambulatory referral to [facility]   Complete by: As directed    Please evaluate [name] for admission to [facility].  Disciplines requested: Physical Therapy  Services to provide: Strengthening Exercises and Evaluate  Physician to follow patient's care (the person listed here will be responsible for signing ongoing orders): Referring Provider  Requested Start of Care Date: Tomorrow  I certify that  this patient is under my care and that I, or a Nurse Practitioner or Physician's Assistant working with me, had a face-to-face encounter that meets the physician face-to-face requirements with patient on 2.24.2021. The encounter with the patient was in whole, or in part for the following medical condition(s) which is the primary reason for home health care (List medical condition). Knee replacement   Special Instructions:  no   Does the patient have Medicare or Medicaid?: No   The encounter with the patient was in whole, or in part, for the following medical condition, which is the primary reason for home health care: knee replacement   Reason for Medically Necessary Home Health Services: Therapy- Personnel officer, Training and development officer and Stair Training   My clinical findings support the need for the above services: Pain interferes with ambulation/mobility   I certify that, based on my findings, the following services are medically necessary home health services: Physical therapy   Further, I certify that my clinical findings support that this patient is homebound due to: Ambulates short distances less than 300 feet     Allergies as of 04/19/2019      Reactions   Acetaminophen-codeine Rash   Tylenol with Codeine Tylenol #3,      Medication List    STOP taking these medications   HYDROcodone-acetaminophen 5-325 MG tablet Commonly known as: NORCO/VICODIN   naproxen sodium 220 MG tablet  Commonly known as: ALEVE     TAKE these medications   aspirin 81 MG chewable tablet Chew 1 tablet (81 mg total) by mouth 2 (two) times daily.   bisacodyl 5 MG EC tablet Commonly known as: DULCOLAX Take 1 tablet (5 mg total) by mouth daily as needed for moderate constipation.   diclofenac 75 MG EC tablet Commonly known as: VOLTAREN Take 75 mg by mouth 2 (two) times daily.   docusate sodium 100 MG capsule Commonly known as: COLACE Take 1 capsule (100 mg total) by mouth 2 (two) times daily.   ezetimibe  10 MG tablet Commonly known as: ZETIA Take 10 mg by mouth daily.   Fish Oil 1000 MG Caps Take 2,000 mg by mouth daily.   gabapentin 300 MG capsule Commonly known as: NEURONTIN Take 1 capsule (300 mg total) by mouth 3 (three) times daily.   HM Vitamin D3 50 MCG (2000 UT) Caps Generic drug: Cholecalciferol Take 2,000 Units by mouth daily.   hydrochlorothiazide 25 MG tablet Commonly known as: HYDRODIURIL Take 25 mg by mouth daily.   methocarbamol 500 MG tablet Commonly known as: ROBAXIN Take 1 tablet (500 mg total) by mouth every 6 (six) hours as needed for muscle spasms.   oxybutynin 15 MG 24 hr tablet Commonly known as: DITROPAN XL Take 15 mg by mouth 2 (two) times daily.   oxyCODONE 5 MG immediate release tablet Commonly known as: Oxy IR/ROXICODONE Take 1 tablet (5 mg total) by mouth every 4 (four) hours as needed for up to 7 days for severe pain.   pantoprazole 40 MG tablet Commonly known as: PROTONIX Take 1 tablet (40 mg total) by mouth 2 (two) times daily before a meal.   SUDAFED PE PRESSURE + PAIN PO Take 2 tablets by mouth at bedtime as needed (sinus pain).   TIGER BALM MUSCLE RUB EX Apply 1 application topically daily as needed (muscle pain).   VITA-C PO Take 3 tablets by mouth daily. Gummie   zinc sulfate 220 (50 Zn) MG capsule Take 220 mg by mouth daily.            Durable Medical Equipment  (From admission, onward)         Start     Ordered   04/19/19 1323  For home use only DME Bedside commode  Once    Question:  Patient needs a bedside commode to treat with the following condition  Answer:  H/O total knee replacement   04/19/19 1325   04/19/19 1323  For home use only DME Walker  Once    Question:  Patient needs a walker to treat with the following condition  Answer:  History of total right knee replacement   04/19/19 1325         Follow-up Information    Vickki Hearing, MD Follow up on 05/03/2019.   Specialties: Orthopedic Surgery,  Radiology Contact information: [address] [place] [address] [phone]           Signed: Fuller Canada 04/19/2019, 1:26 PM

## 2019-04-20 ENCOUNTER — Encounter (HOSPITAL_COMMUNITY): Payer: BC Managed Care – PPO | Admitting: Physical Therapy

## 2019-04-21 LAB — TYPE AND SCREEN
ABO/RH(D): O NEG
Antibody Screen: NEGATIVE
Unit division: 0
Unit division: 0

## 2019-04-21 LAB — BPAM RBC
Blood Product Expiration Date: 202103282359
Blood Product Expiration Date: 202103292359
Unit Type and Rh: 9500
Unit Type and Rh: 9500

## 2019-04-24 ENCOUNTER — Telehealth: Payer: Self-pay | Admitting: Radiology

## 2019-04-24 DIAGNOSIS — M1711 Unilateral primary osteoarthritis, right knee: Secondary | ICD-10-CM | POA: Diagnosis not present

## 2019-04-24 DIAGNOSIS — Z96651 Presence of right artificial knee joint: Secondary | ICD-10-CM | POA: Diagnosis not present

## 2019-04-24 NOTE — Telephone Encounter (Signed)
The hospital has failed to arrange her home physical therapy, sent me a message when I was out of the office to set this up. She lives in [place] I spoke to her, she will go for outpatient in [place] I have sent order and asked them to call her  To you [facility]

## 2019-04-25 ENCOUNTER — Encounter (HOSPITAL_COMMUNITY): Payer: BC Managed Care – PPO | Admitting: Physical Therapy

## 2019-04-26 ENCOUNTER — Other Ambulatory Visit: Payer: Self-pay | Admitting: Orthopedic Surgery

## 2019-04-26 MED ORDER — OXYCODONE HCL 5 MG PO TABS: 5.0000 mg | ORAL_TABLET | Freq: Four times a day (QID) | ORAL | 0 refills | Status: DC | PRN

## 2019-04-26 NOTE — Telephone Encounter (Signed)
Patient requests refill oxyCODONE (OXY IR/ROXICODONE) 5 MG immediate release tablet 42 tablet  -[facility] in [place]

## 2019-04-26 NOTE — Progress Notes (Signed)
Meds ordered this encounter  Medications  . oxyCODONE (OXY IR/ROXICODONE) 5 MG immediate release tablet    Sig: Take 1 tablet (5 mg total) by mouth every 6 (six) hours as needed for up to 5 days for severe pain.    Dispense:  20 tablet    Refill:  0

## 2019-04-27 ENCOUNTER — Encounter (HOSPITAL_COMMUNITY): Payer: BC Managed Care – PPO | Admitting: Physical Therapy

## 2019-05-02 ENCOUNTER — Encounter (HOSPITAL_COMMUNITY): Payer: BC Managed Care – PPO | Admitting: Physical Therapy

## 2019-05-02 DIAGNOSIS — M25561 Pain in right knee: Secondary | ICD-10-CM | POA: Diagnosis not present

## 2019-05-03 ENCOUNTER — Other Ambulatory Visit: Payer: Self-pay

## 2019-05-03 ENCOUNTER — Encounter: Payer: Self-pay | Admitting: Orthopedic Surgery

## 2019-05-03 ENCOUNTER — Ambulatory Visit (INDEPENDENT_AMBULATORY_CARE_PROVIDER_SITE_OTHER): Payer: BC Managed Care – PPO | Admitting: Orthopedic Surgery

## 2019-05-03 DIAGNOSIS — Z471 Aftercare following joint replacement surgery: Secondary | ICD-10-CM

## 2019-05-03 DIAGNOSIS — M25561 Pain in right knee: Secondary | ICD-10-CM | POA: Diagnosis not present

## 2019-05-03 DIAGNOSIS — Z96651 Presence of right artificial knee joint: Secondary | ICD-10-CM

## 2019-05-03 MED ORDER — OXYCODONE HCL 5 MG PO TABS: 5.0000 mg | ORAL_TABLET | Freq: Three times a day (TID) | ORAL | 0 refills | Status: AC | PRN

## 2019-05-03 NOTE — Patient Instructions (Signed)
Take all of the medications prescribed after surgery   Taper away from the oxycodone

## 2019-05-03 NOTE — Progress Notes (Signed)
Chief Complaint  Patient presents with  . Post-op Follow-up    right total knee replacement 04/18/19     No PT,  no home therapy available   Incision was clean calf was soft   Continue PT  Continue therapy   Return in 4 weeks   Encounter Diagnoses  Name Primary?  . 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 Aftercare following right knee joint replacement surgery    Meds ordered this encounter  Medications  . oxyCODONE (OXY IR/ROXICODONE) 5 MG immediate release tablet    Sig: Take 1 tablet (5 mg total) by mouth every 8 (eight) hours as needed for up to 7 days for severe pain.    Dispense:  21 tablet    Refill:  0

## 2019-05-04 ENCOUNTER — Encounter (HOSPITAL_COMMUNITY): Payer: BC Managed Care – PPO | Admitting: Physical Therapy

## 2019-05-05 ENCOUNTER — Telehealth: Payer: Self-pay

## 2019-05-05 NOTE — Telephone Encounter (Signed)
[name] called stating that she noticed a place on her eyelid night before last after taking a shower and thought it might have been a mosquito bite and has broken out with a rash that is covering her neck, face, ears, arms,hands that itches and her fingers are swollen. Stated that she was wondering what to take over the counter because she didn't want it to get to her incision. I suggested to her that she could talk with her pharmacist and they could look at her meds and suggest what she may can take. I also suggested that she could call her PCP and talk to him/her to see what they suggest.

## 2019-05-06 DIAGNOSIS — I10 Essential (primary) hypertension: Secondary | ICD-10-CM | POA: Diagnosis not present

## 2019-05-06 DIAGNOSIS — R21 Rash and other nonspecific skin eruption: Secondary | ICD-10-CM | POA: Diagnosis not present

## 2019-05-06 DIAGNOSIS — Z885 Allergy status to narcotic agent status: Secondary | ICD-10-CM | POA: Diagnosis not present

## 2019-05-06 DIAGNOSIS — X58XXXA Exposure to other specified factors, initial encounter: Secondary | ICD-10-CM | POA: Diagnosis not present

## 2019-05-06 DIAGNOSIS — Z79899 Other long term (current) drug therapy: Secondary | ICD-10-CM | POA: Diagnosis not present

## 2019-05-06 DIAGNOSIS — L509 Urticaria, unspecified: Secondary | ICD-10-CM | POA: Diagnosis not present

## 2019-05-06 DIAGNOSIS — R202 Paresthesia of skin: Secondary | ICD-10-CM | POA: Diagnosis not present

## 2019-05-06 DIAGNOSIS — T781XXA Other adverse food reactions, not elsewhere classified, initial encounter: Secondary | ICD-10-CM | POA: Diagnosis not present

## 2019-05-06 DIAGNOSIS — L500 Allergic urticaria: Secondary | ICD-10-CM | POA: Diagnosis not present

## 2019-05-08 DIAGNOSIS — M25561 Pain in right knee: Secondary | ICD-10-CM | POA: Diagnosis not present

## 2019-05-09 ENCOUNTER — Encounter (HOSPITAL_COMMUNITY): Payer: BC Managed Care – PPO | Admitting: Physical Therapy

## 2019-05-11 ENCOUNTER — Encounter (HOSPITAL_COMMUNITY): Payer: BC Managed Care – PPO | Admitting: Physical Therapy

## 2019-05-11 DIAGNOSIS — M25561 Pain in right knee: Secondary | ICD-10-CM | POA: Diagnosis not present

## 2019-05-15 DIAGNOSIS — M25561 Pain in right knee: Secondary | ICD-10-CM | POA: Diagnosis not present

## 2019-05-16 ENCOUNTER — Other Ambulatory Visit: Payer: Self-pay | Admitting: Orthopedic Surgery

## 2019-05-16 ENCOUNTER — Telehealth: Payer: Self-pay | Admitting: Orthopedic Surgery

## 2019-05-16 MED ORDER — METHOCARBAMOL 500 MG PO TABS: 500.0000 mg | ORAL_TABLET | Freq: Four times a day (QID) | ORAL | 1 refills | Status: DC | PRN

## 2019-05-16 MED ORDER — OXYCODONE HCL 5 MG PO CAPS: 5.0000 mg | ORAL_CAPSULE | Freq: Three times a day (TID) | ORAL | 0 refills | Status: DC | PRN

## 2019-05-16 MED ORDER — GABAPENTIN 300 MG PO CAPS: 300.0000 mg | ORAL_CAPSULE | Freq: Three times a day (TID) | ORAL | 0 refills | Status: DC

## 2019-05-16 NOTE — Telephone Encounter (Addendum)
Patient requests refill on Methocarbamol 500 mgs.  Qty 60  Sig: Take 1 tablet (500 mg total) by mouth every 6 (six) hours as needed for muscle spasms.  Patient states she uses [facility] in [place]

## 2019-05-16 NOTE — Telephone Encounter (Signed)
Pharmacist Angie called regarding prescription at [facility], [facility] - states Oxycodone 5 does not come in capsules - please call [phone]

## 2019-05-16 NOTE — Telephone Encounter (Signed)
Patient requests refill on Gabapentin 300 mgs.  Qty 60  Sig: Take 1 capsule (300 mg total) by mouth 3 (three) times daily.  Patient states she uses Psychologist, forensic in [place]

## 2019-05-16 NOTE — Telephone Encounter (Signed)
Patient requests refill on Oxycodone 5 mgs. Q 21  Sig: Take 1 tablet (5 mg total) by mouth every 8 (eight) hours as needed   Patient state she uses Psychologist, forensic in [place]

## 2019-05-18 DIAGNOSIS — M25561 Pain in right knee: Secondary | ICD-10-CM | POA: Diagnosis not present

## 2019-05-23 DIAGNOSIS — M25561 Pain in right knee: Secondary | ICD-10-CM | POA: Diagnosis not present

## 2019-05-25 DIAGNOSIS — M25561 Pain in right knee: Secondary | ICD-10-CM | POA: Diagnosis not present

## 2019-05-30 DIAGNOSIS — M25561 Pain in right knee: Secondary | ICD-10-CM | POA: Diagnosis not present

## 2019-05-31 ENCOUNTER — Other Ambulatory Visit: Payer: Self-pay

## 2019-05-31 ENCOUNTER — Encounter: Payer: Self-pay | Admitting: Orthopedic Surgery

## 2019-05-31 ENCOUNTER — Ambulatory Visit (INDEPENDENT_AMBULATORY_CARE_PROVIDER_SITE_OTHER): Payer: BC Managed Care – PPO | Admitting: Orthopedic Surgery

## 2019-05-31 DIAGNOSIS — Z96651 Presence of right artificial knee joint: Secondary | ICD-10-CM

## 2019-05-31 DIAGNOSIS — Z471 Aftercare following joint replacement surgery: Secondary | ICD-10-CM

## 2019-05-31 MED ORDER — HYDROCODONE-ACETAMINOPHEN 10-325 MG PO TABS: 1.0000 | ORAL_TABLET | Freq: Four times a day (QID) | ORAL | 0 refills | Status: DC | PRN

## 2019-05-31 NOTE — Progress Notes (Signed)
Chief Complaint  Patient presents with  . Routine Post Op    04/18/19 right knee replacement going for therapy bending to 95    Encounter Diagnoses  Name Primary?  . S/P total knee replacement, right 04/18/19   . Aftercare following right knee joint replacement surgery Yes   Last visit Status post right total knee when we saw her last she had not started therapy so she is here after therapy has been initiated  Pain med: Oxycodone 5 mg IR every 8 hours   Note: Pain mostly at night   Therapy going slowly patient is ambulating with a cane therapy notes indicate 95 degrees of flexion with force I indicate or observe 90 degrees with full extension she does have some scarring in the suprapatellar pouch she is 6 weeks postop  Recommend physical therapy for 5 weeks and then reassess if no improvement consider manipulation change pain medication opioid precautions reviewed with patient  Meds ordered this encounter  Medications  . HYDROcodone-acetaminophen (NORCO) 10-325 MG tablet    Sig: Take 1 tablet by mouth every 6 (six) hours as needed.    Dispense:  30 tablet    Refill:  0

## 2019-06-01 DIAGNOSIS — M25561 Pain in right knee: Secondary | ICD-10-CM | POA: Diagnosis not present

## 2019-06-06 DIAGNOSIS — M25561 Pain in right knee: Secondary | ICD-10-CM | POA: Diagnosis not present

## 2019-06-08 DIAGNOSIS — M25561 Pain in right knee: Secondary | ICD-10-CM | POA: Diagnosis not present

## 2019-06-13 DIAGNOSIS — M25561 Pain in right knee: Secondary | ICD-10-CM | POA: Diagnosis not present

## 2019-06-15 ENCOUNTER — Other Ambulatory Visit: Payer: Self-pay | Admitting: Orthopedic Surgery

## 2019-06-15 DIAGNOSIS — Z96651 Presence of right artificial knee joint: Secondary | ICD-10-CM

## 2019-06-15 DIAGNOSIS — Z471 Aftercare following joint replacement surgery: Secondary | ICD-10-CM

## 2019-06-15 DIAGNOSIS — M25561 Pain in right knee: Secondary | ICD-10-CM | POA: Diagnosis not present

## 2019-06-15 MED ORDER — HYDROCODONE-ACETAMINOPHEN 5-325 MG PO TABS: 1.0000 | ORAL_TABLET | Freq: Four times a day (QID) | ORAL | 0 refills | Status: AC | PRN

## 2019-06-15 NOTE — Progress Notes (Signed)
Meds ordered this encounter  Medications  . HYDROcodone-acetaminophen (NORCO/VICODIN) 5-325 MG tablet    Sig: Take 1 tablet by mouth every 6 (six) hours as needed for up to 7 days for moderate pain.    Dispense:  28 tablet    Refill:  0    Reduction

## 2019-06-15 NOTE — Telephone Encounter (Signed)
Patient requests refill on Hydrocodone/Acetaminophen 10-325 mgs.  Qty 30  Sig: Take 1 tablet by mouth every 6 (six) hours as needed.  Patient states he uses Statistic[facility] in [place]

## 2019-06-20 DIAGNOSIS — M25561 Pain in right knee: Secondary | ICD-10-CM | POA: Diagnosis not present

## 2019-06-21 DIAGNOSIS — R921 Mammographic calcification found on diagnostic imaging of breast: Secondary | ICD-10-CM | POA: Diagnosis not present

## 2019-06-23 DIAGNOSIS — M25561 Pain in right knee: Secondary | ICD-10-CM | POA: Diagnosis not present

## 2019-06-27 DIAGNOSIS — M25561 Pain in right knee: Secondary | ICD-10-CM | POA: Diagnosis not present

## 2019-06-29 DIAGNOSIS — M25561 Pain in right knee: Secondary | ICD-10-CM | POA: Diagnosis not present

## 2019-07-05 ENCOUNTER — Encounter: Payer: Self-pay | Admitting: Orthopedic Surgery

## 2019-07-05 ENCOUNTER — Other Ambulatory Visit: Payer: Self-pay

## 2019-07-05 ENCOUNTER — Ambulatory Visit (INDEPENDENT_AMBULATORY_CARE_PROVIDER_SITE_OTHER): Payer: BC Managed Care – PPO | Admitting: Orthopedic Surgery

## 2019-07-05 DIAGNOSIS — Z96651 Presence of right artificial knee joint: Secondary | ICD-10-CM

## 2019-07-05 MED ORDER — HYDROCODONE-ACETAMINOPHEN 5-325 MG PO TABS: 1.0000 | ORAL_TABLET | Freq: Four times a day (QID) | ORAL | 0 refills | Status: AC | PRN

## 2019-07-05 MED ORDER — PREDNISONE 10 MG (48) PO TBPK: ORAL_TABLET | ORAL | 0 refills | Status: DC

## 2019-07-05 NOTE — Progress Notes (Signed)
Chief Complaint  Patient presents with  . Routine Post Op    04/18/19 right total knee replacement / improving with therapy    Be 59 years old 3 months after her right total knee she has 2 main complaints #1 she has pain in her shin she has pain over the medial soft tissues involving the pes tendons with swelling  Her range of motion is 2-110 degrees  Examination shows no signs of infection she does have pain and swelling over the flexor tendons on the medial side of the knee and she has some pain and tenderness in the shin  Otherwise doing well  Recommend tapering her hydrocodone off  Brief period of steroids to address the shin pain  Ice over the area of soft tissue swelling   Request return to work on June 1  3 months return

## 2019-07-05 NOTE — Patient Instructions (Addendum)
Ice the area several times a day   Start prednisone   Take less hydrocodone, its an opioid and can lead to addiction; use more tylenol and advil   Needs RTW (her choice)

## 2019-07-20 ENCOUNTER — Other Ambulatory Visit: Payer: Self-pay | Admitting: Orthopedic Surgery

## 2019-07-20 MED ORDER — TRAMADOL-ACETAMINOPHEN 37.5-325 MG PO TABS: 1.0000 | ORAL_TABLET | ORAL | 0 refills | Status: AC | PRN

## 2019-07-20 NOTE — Telephone Encounter (Signed)
Patient requests refill on Hydrocodone/Acetaminophen 5-325  Mgs.  Qty  20  Sig: Take 1 tablet by mouth every 6 (six) hours as needed for up to 5 days for moderate pain.  Patient states she uses [facility] in [place] 

## 2019-08-07 ENCOUNTER — Other Ambulatory Visit: Payer: Self-pay | Admitting: Orthopedic Surgery

## 2019-08-07 NOTE — Telephone Encounter (Signed)
Patient requests refill on Tramadol 37.5-325  Mgs.  Qty  30      Sig: Take 1 tablet by mouth every 4 (four) hours as needed for up to 5 days.    Patient uses [facility] in [place]

## 2019-08-08 MED ORDER — TRAMADOL-ACETAMINOPHEN 37.5-325 MG PO TABS: 1.0000 | ORAL_TABLET | ORAL | 0 refills | Status: DC | PRN

## 2019-08-30 ENCOUNTER — Other Ambulatory Visit: Payer: Self-pay | Admitting: Orthopedic Surgery

## 2019-08-30 MED ORDER — TRAMADOL-ACETAMINOPHEN 37.5-325 MG PO TABS: 1.0000 | ORAL_TABLET | ORAL | 0 refills | Status: DC | PRN

## 2019-08-30 NOTE — Telephone Encounter (Signed)
Patient requests refill:  traMADol-acetaminophen (ULTRACET) 37.5-325 MG tablet 30 tablet  - [facility], [place]

## 2019-09-13 DIAGNOSIS — Z6837 Body mass index (BMI) 37.0-37.9, adult: Secondary | ICD-10-CM | POA: Diagnosis not present

## 2019-10-06 ENCOUNTER — Encounter: Payer: Self-pay | Admitting: Orthopedic Surgery

## 2019-10-06 ENCOUNTER — Other Ambulatory Visit: Payer: Self-pay

## 2019-10-06 ENCOUNTER — Ambulatory Visit (INDEPENDENT_AMBULATORY_CARE_PROVIDER_SITE_OTHER): Payer: BC Managed Care – PPO | Admitting: Orthopedic Surgery

## 2019-10-06 VITALS — BP 142/84 | HR 76 | Ht 61.0 in | Wt 195.0 lb

## 2019-10-06 DIAGNOSIS — R202 Paresthesia of skin: Secondary | ICD-10-CM | POA: Diagnosis not present

## 2019-10-06 DIAGNOSIS — Z96651 Presence of right artificial knee joint: Secondary | ICD-10-CM

## 2019-10-06 MED ORDER — GABAPENTIN 300 MG PO CAPS: 300.0000 mg | ORAL_CAPSULE | Freq: Two times a day (BID) | ORAL | 1 refills | Status: DC

## 2019-10-06 NOTE — Progress Notes (Signed)
Chief Complaint  Patient presents with  . Foot Pain    right foot "electric shock"  . Routine Post Op    04/18/19 right knee replacement / feels better    76-month checkup status post right total knee patient says she is doing well she is able to work and is happy with her knee  Her incision healed nicely she has good flexion extension with no instability she is walking unsupported without a limp  She is complaining of some tingling on the top of her foot   BP (!) 142/84   Pulse 76   Ht 5\' 1"  (1.549 m)   Wt 195 lb (88.5 kg)   BMI 36.84 kg/m   Examination reveals hyperesthesias from the ankle joint down mainly on the top of the foot foot alignment is otherwise normal there is no evidence of flatfoot deformity no atrophy  Ankle range of motion normal   We did decide to give her some medication on an as-needed basis  Encounter Diagnoses  Name Primary?  . S/P total knee replacement, right 04/18/19   . Paresthesia of right foot Yes     Meds ordered this encounter  Medications  . gabapentin (NEURONTIN) 300 MG capsule    Sig: Take 1 capsule (300 mg total) by mouth 2 (two) times daily.    Dispense:  60 capsule    Refill:  1   Acute uncomplicated/medication management

## 2019-11-20 DIAGNOSIS — S39012A Strain of muscle, fascia and tendon of lower back, initial encounter: Secondary | ICD-10-CM | POA: Diagnosis not present

## 2019-11-20 DIAGNOSIS — M545 Low back pain: Secondary | ICD-10-CM | POA: Diagnosis not present

## 2019-12-04 ENCOUNTER — Encounter (HOSPITAL_COMMUNITY): Payer: Self-pay

## 2019-12-04 ENCOUNTER — Other Ambulatory Visit: Payer: Self-pay

## 2019-12-04 ENCOUNTER — Encounter (HOSPITAL_COMMUNITY)
Admission: EM | Disposition: A | Discharge status: Nursing Home (Includes ICF) | Payer: Self-pay | Source: Home / Self Care | Attending: General Surgery

## 2019-12-04 ENCOUNTER — Emergency Department (HOSPITAL_COMMUNITY): Payer: BC Managed Care – PPO | Admitting: Anesthesiology

## 2019-12-04 ENCOUNTER — Inpatient Hospital Stay (HOSPITAL_COMMUNITY)
Admission: EM | Admit: 2019-12-04 | Discharge: 2019-12-18 | DRG: 329 | Disposition: A | Discharge status: Other Acute Inpatient Hospital | Payer: BC Managed Care – PPO | Attending: General Surgery | Admitting: General Surgery

## 2019-12-04 ENCOUNTER — Inpatient Hospital Stay (HOSPITAL_COMMUNITY): Payer: BC Managed Care – PPO

## 2019-12-04 ENCOUNTER — Emergency Department (HOSPITAL_COMMUNITY): Payer: BC Managed Care – PPO

## 2019-12-04 DIAGNOSIS — E876 Hypokalemia: Secondary | ICD-10-CM

## 2019-12-04 DIAGNOSIS — D6489 Other specified anemias: Secondary | ICD-10-CM | POA: Diagnosis present

## 2019-12-04 DIAGNOSIS — E871 Hypo-osmolality and hyponatremia: Secondary | ICD-10-CM | POA: Diagnosis not present

## 2019-12-04 DIAGNOSIS — Z4682 Encounter for fitting and adjustment of non-vascular catheter: Secondary | ICD-10-CM | POA: Diagnosis not present

## 2019-12-04 DIAGNOSIS — D62 Acute posthemorrhagic anemia: Secondary | ICD-10-CM | POA: Diagnosis not present

## 2019-12-04 DIAGNOSIS — R69 Illness, unspecified: Secondary | ICD-10-CM | POA: Diagnosis not present

## 2019-12-04 DIAGNOSIS — R112 Nausea with vomiting, unspecified: Secondary | ICD-10-CM | POA: Diagnosis not present

## 2019-12-04 DIAGNOSIS — E8809 Other disorders of plasma-protein metabolism, not elsewhere classified: Secondary | ICD-10-CM | POA: Diagnosis present

## 2019-12-04 DIAGNOSIS — K660 Peritoneal adhesions (postprocedural) (postinfection): Secondary | ICD-10-CM | POA: Diagnosis not present

## 2019-12-04 DIAGNOSIS — K3533 Acute appendicitis with perforation and localized peritonitis, with abscess: Secondary | ICD-10-CM | POA: Diagnosis not present

## 2019-12-04 DIAGNOSIS — K9189 Other postprocedural complications and disorders of digestive system: Secondary | ICD-10-CM | POA: Diagnosis not present

## 2019-12-04 DIAGNOSIS — Z20822 Contact with and (suspected) exposure to covid-19: Secondary | ICD-10-CM | POA: Diagnosis present

## 2019-12-04 DIAGNOSIS — K6389 Other specified diseases of intestine: Secondary | ICD-10-CM | POA: Diagnosis not present

## 2019-12-04 DIAGNOSIS — K631 Perforation of intestine (nontraumatic): Secondary | ICD-10-CM

## 2019-12-04 DIAGNOSIS — K219 Gastro-esophageal reflux disease without esophagitis: Secondary | ICD-10-CM | POA: Diagnosis not present

## 2019-12-04 DIAGNOSIS — Z9911 Dependence on respirator [ventilator] status: Secondary | ICD-10-CM | POA: Diagnosis not present

## 2019-12-04 DIAGNOSIS — B961 Klebsiella pneumoniae [K. pneumoniae] as the cause of diseases classified elsewhere: Secondary | ICD-10-CM | POA: Diagnosis present

## 2019-12-04 DIAGNOSIS — K650 Generalized (acute) peritonitis: Secondary | ICD-10-CM | POA: Diagnosis not present

## 2019-12-04 DIAGNOSIS — B9620 Unspecified Escherichia coli [E. coli] as the cause of diseases classified elsewhere: Secondary | ICD-10-CM | POA: Diagnosis present

## 2019-12-04 DIAGNOSIS — R739 Hyperglycemia, unspecified: Secondary | ICD-10-CM | POA: Diagnosis not present

## 2019-12-04 DIAGNOSIS — F419 Anxiety disorder, unspecified: Secondary | ICD-10-CM | POA: Diagnosis not present

## 2019-12-04 DIAGNOSIS — R000 Tachycardia, unspecified: Secondary | ICD-10-CM | POA: Diagnosis not present

## 2019-12-04 DIAGNOSIS — R531 Weakness: Secondary | ICD-10-CM | POA: Diagnosis not present

## 2019-12-04 DIAGNOSIS — R21 Rash and other nonspecific skin eruption: Secondary | ICD-10-CM | POA: Diagnosis not present

## 2019-12-04 DIAGNOSIS — I10 Essential (primary) hypertension: Secondary | ICD-10-CM | POA: Diagnosis not present

## 2019-12-04 DIAGNOSIS — K651 Peritoneal abscess: Secondary | ICD-10-CM | POA: Diagnosis not present

## 2019-12-04 DIAGNOSIS — Z452 Encounter for adjustment and management of vascular access device: Secondary | ICD-10-CM

## 2019-12-04 DIAGNOSIS — E46 Unspecified protein-calorie malnutrition: Secondary | ICD-10-CM | POA: Diagnosis not present

## 2019-12-04 DIAGNOSIS — E6609 Other obesity due to excess calories: Secondary | ICD-10-CM | POA: Diagnosis not present

## 2019-12-04 DIAGNOSIS — B952 Enterococcus as the cause of diseases classified elsewhere: Secondary | ICD-10-CM | POA: Diagnosis not present

## 2019-12-04 DIAGNOSIS — K659 Peritonitis, unspecified: Secondary | ICD-10-CM | POA: Diagnosis not present

## 2019-12-04 DIAGNOSIS — Z01818 Encounter for other preprocedural examination: Secondary | ICD-10-CM

## 2019-12-04 DIAGNOSIS — N12 Tubulo-interstitial nephritis, not specified as acute or chronic: Secondary | ICD-10-CM | POA: Diagnosis not present

## 2019-12-04 DIAGNOSIS — G8918 Other acute postprocedural pain: Secondary | ICD-10-CM | POA: Diagnosis not present

## 2019-12-04 DIAGNOSIS — K529 Noninfective gastroenteritis and colitis, unspecified: Secondary | ICD-10-CM | POA: Diagnosis not present

## 2019-12-04 DIAGNOSIS — D649 Anemia, unspecified: Secondary | ICD-10-CM | POA: Diagnosis not present

## 2019-12-04 DIAGNOSIS — K912 Postsurgical malabsorption, not elsewhere classified: Secondary | ICD-10-CM | POA: Diagnosis not present

## 2019-12-04 DIAGNOSIS — T8132XA Disruption of internal operation (surgical) wound, not elsewhere classified, initial encounter: Secondary | ICD-10-CM | POA: Diagnosis not present

## 2019-12-04 DIAGNOSIS — R5381 Other malaise: Secondary | ICD-10-CM | POA: Diagnosis not present

## 2019-12-04 DIAGNOSIS — K632 Fistula of intestine: Secondary | ICD-10-CM | POA: Diagnosis present

## 2019-12-04 DIAGNOSIS — Z9049 Acquired absence of other specified parts of digestive tract: Secondary | ICD-10-CM

## 2019-12-04 DIAGNOSIS — R1110 Vomiting, unspecified: Secondary | ICD-10-CM | POA: Diagnosis not present

## 2019-12-04 DIAGNOSIS — G894 Chronic pain syndrome: Secondary | ICD-10-CM | POA: Diagnosis not present

## 2019-12-04 DIAGNOSIS — Z6831 Body mass index (BMI) 31.0-31.9, adult: Secondary | ICD-10-CM | POA: Diagnosis not present

## 2019-12-04 DIAGNOSIS — E860 Dehydration: Secondary | ICD-10-CM | POA: Diagnosis not present

## 2019-12-04 DIAGNOSIS — R109 Unspecified abdominal pain: Secondary | ICD-10-CM | POA: Diagnosis not present

## 2019-12-04 DIAGNOSIS — L89153 Pressure ulcer of sacral region, stage 3: Secondary | ICD-10-CM | POA: Diagnosis not present

## 2019-12-04 HISTORY — PX: LAPAROTOMY: SHX154

## 2019-12-04 HISTORY — PX: PARTIAL COLECTOMY: SHX5273

## 2019-12-04 LAB — URINALYSIS, ROUTINE W REFLEX MICROSCOPIC
Bilirubin Urine: NEGATIVE
Glucose, UA: NEGATIVE mg/dL
Glucose, UA: NEGATIVE mg/dL
Hgb urine dipstick: NEGATIVE
Hgb urine dipstick: NEGATIVE
Ketones, ur: NEGATIVE mg/dL
Ketones, ur: 5 mg/dL — AB
Leukocytes,Ua: NEGATIVE
Leukocytes,Ua: NEGATIVE
Nitrite: NEGATIVE
Nitrite: NEGATIVE
Protein, ur: 100 mg/dL — AB
Protein, ur: NEGATIVE mg/dL
Specific Gravity, Urine: 1.023 (ref 1.005–1.030)
Specific Gravity, Urine: >1.046 — ABNORMAL HIGH (ref 1.005–1.030)
pH: 5 (ref 5.0–8.0)
pH: 6 (ref 5.0–8.0)

## 2019-12-04 LAB — COMPREHENSIVE METABOLIC PANEL
ALT: 24 U/L (ref 0–44)
AST: 24 U/L (ref 15–41)
Albumin: 2.4 g/dL — ABNORMAL LOW (ref 3.5–5.0)
Alkaline Phosphatase: 77 U/L (ref 38–126)
Anion gap: 13 (ref 5–15)
BUN: 24 mg/dL — ABNORMAL HIGH (ref 6–20)
CO2: 26 mmol/L (ref 22–32)
Calcium: 8.2 mg/dL — ABNORMAL LOW (ref 8.9–10.3)
Chloride: 93 mmol/L — ABNORMAL LOW (ref 98–111)
Creatinine, Ser: 0.91 mg/dL (ref 0.44–1.00)
GFR, Estimated: >60 mL/min (ref >60)
Glucose, Bld: 130 mg/dL — ABNORMAL HIGH (ref 70–99)
Potassium: 2.5 mmol/L — CL (ref 3.5–5.1)
Sodium: 132 mmol/L — ABNORMAL LOW (ref 135–145)
Total Bilirubin: 1.6 mg/dL — ABNORMAL HIGH (ref 0.3–1.2)
Total Protein: 7.3 g/dL (ref 6.5–8.1)

## 2019-12-04 LAB — CBC WITH DIFFERENTIAL/PLATELET
Abs Immature Granulocytes: 0.22 10*3/uL — ABNORMAL HIGH (ref 0.00–0.07)
Basophils Absolute: 0 10*3/uL (ref 0.0–0.1)
Basophils Relative: 0 %
Eosinophils Absolute: 0 10*3/uL (ref 0.0–0.5)
Eosinophils Relative: 0 %
HCT: 38.2 % (ref 36.0–46.0)
Hemoglobin: 12.6 g/dL (ref 12.0–15.0)
Immature Granulocytes: 2 %
Lymphocytes Relative: 10 %
Lymphs Abs: 1.3 10*3/uL (ref 0.7–4.0)
MCH: 28.8 pg (ref 26.0–34.0)
MCHC: 33 g/dL (ref 30.0–36.0)
MCV: 87.4 fL (ref 80.0–100.0)
Monocytes Absolute: 0.8 10*3/uL (ref 0.1–1.0)
Monocytes Relative: 6 %
Neutro Abs: 10.8 10*3/uL — ABNORMAL HIGH (ref 1.7–7.7)
Neutrophils Relative %: 82 %
Platelets: 437 10*3/uL — ABNORMAL HIGH (ref 150–400)
RBC: 4.37 MIL/uL (ref 3.87–5.11)
RDW: 14.2 % (ref 11.5–15.5)
WBC: 13.2 10*3/uL — ABNORMAL HIGH (ref 4.0–10.5)
nRBC: 0 % (ref 0.0–0.2)

## 2019-12-04 LAB — LIPASE, BLOOD: Lipase: 26 U/L (ref 11–51)

## 2019-12-04 LAB — RESPIRATORY PANEL BY RT PCR (FLU A&B, COVID)
Influenza A by PCR: NEGATIVE
Influenza B by PCR: NEGATIVE
SARS Coronavirus 2 by RT PCR: NEGATIVE

## 2019-12-04 LAB — MRSA PCR SCREENING: MRSA by PCR: NEGATIVE

## 2019-12-04 LAB — PREPARE RBC (CROSSMATCH)

## 2019-12-04 SURGERY — LAPAROTOMY, EXPLORATORY
Anesthesia: General | Laterality: Right

## 2019-12-04 MED ORDER — ONDANSETRON 4 MG PO TBDP: 4.0000 mg | ORAL_TABLET | Freq: Four times a day (QID) | ORAL | Status: DC | PRN

## 2019-12-04 MED ORDER — EPHEDRINE 5 MG/ML INJ
INTRAVENOUS | Status: AC
  Filled 2019-12-04: qty 10

## 2019-12-04 MED ORDER — FENTANYL CITRATE (PF) 250 MCG/5ML IJ SOLN
INTRAMUSCULAR | Status: DC | PRN
  Administered 2019-12-04: 100 ug via INTRAVENOUS
  Administered 2019-12-04: 50 ug via INTRAVENOUS
  Administered 2019-12-04: 100 ug via INTRAVENOUS
  Administered 2019-12-04: 50 ug via INTRAVENOUS
  Administered 2019-12-04: 100 ug via INTRAVENOUS
  Administered 2019-12-04 (×2): 50 ug via INTRAVENOUS

## 2019-12-04 MED ORDER — IOHEXOL 300 MG/ML  SOLN
100.0000 mL | Freq: Once | INTRAMUSCULAR | Status: AC | PRN
  Administered 2019-12-04: 100 mL via INTRAVENOUS

## 2019-12-04 MED ORDER — LIDOCAINE 2% (20 MG/ML) 5 ML SYRINGE
INTRAMUSCULAR | Status: DC | PRN
  Administered 2019-12-04: 100 mg via INTRAVENOUS

## 2019-12-04 MED ORDER — LIDOCAINE 2% (20 MG/ML) 5 ML SYRINGE
INTRAMUSCULAR | Status: AC
  Filled 2019-12-04: qty 5

## 2019-12-04 MED ORDER — ROCURONIUM BROMIDE 10 MG/ML (PF) SYRINGE
PREFILLED_SYRINGE | INTRAVENOUS | Status: DC | PRN
  Administered 2019-12-04 (×2): 50 mg via INTRAVENOUS

## 2019-12-04 MED ORDER — ONDANSETRON HCL 4 MG/2ML IJ SOLN
INTRAMUSCULAR | Status: AC
  Filled 2019-12-04: qty 2

## 2019-12-04 MED ORDER — LACTATED RINGERS IV SOLN: INTRAVENOUS | Status: DC | PRN

## 2019-12-04 MED ORDER — ROCURONIUM BROMIDE 10 MG/ML (PF) SYRINGE
PREFILLED_SYRINGE | INTRAVENOUS | Status: AC
  Filled 2019-12-04: qty 10

## 2019-12-04 MED ORDER — CHLORHEXIDINE GLUCONATE 0.12% ORAL RINSE (MEDLINE KIT)
15.0000 mL | Freq: Two times a day (BID) | OROMUCOSAL | Status: DC
  Administered 2019-12-04 – 2019-12-08 (×8): 15 mL via OROMUCOSAL

## 2019-12-04 MED ORDER — SODIUM CHLORIDE 0.9 % IR SOLN
Status: DC | PRN
  Administered 2019-12-04: 3000 mL
  Administered 2019-12-04: 1000 mL

## 2019-12-04 MED ORDER — POTASSIUM CHLORIDE 10 MEQ/100ML IV SOLN
10.0000 meq | INTRAVENOUS | Status: AC
  Administered 2019-12-04 – 2019-12-05 (×5): 10 meq via INTRAVENOUS
  Filled 2019-12-04 (×5): qty 100

## 2019-12-04 MED ORDER — MIDAZOLAM HCL 2 MG/2ML IJ SOLN
INTRAMUSCULAR | Status: AC
  Filled 2019-12-04: qty 2

## 2019-12-04 MED ORDER — SUCCINYLCHOLINE CHLORIDE 200 MG/10ML IV SOSY
PREFILLED_SYRINGE | INTRAVENOUS | Status: DC | PRN
  Administered 2019-12-04: 120 mg via INTRAVENOUS

## 2019-12-04 MED ORDER — PHENYLEPHRINE 40 MCG/ML (10ML) SYRINGE FOR IV PUSH (FOR BLOOD PRESSURE SUPPORT)
PREFILLED_SYRINGE | INTRAVENOUS | Status: DC | PRN
  Administered 2019-12-04: 40 ug via INTRAVENOUS
  Administered 2019-12-04: 120 ug via INTRAVENOUS
  Administered 2019-12-04: 200 ug via INTRAVENOUS
  Administered 2019-12-04 (×2): 120 ug via INTRAVENOUS

## 2019-12-04 MED ORDER — MIDAZOLAM HCL 2 MG/2ML IJ SOLN: 2.0000 mg | INTRAMUSCULAR | Status: DC | PRN

## 2019-12-04 MED ORDER — PROPOFOL 10 MG/ML IV BOLUS
INTRAVENOUS | Status: DC | PRN
  Administered 2019-12-04: 100 mg via INTRAVENOUS

## 2019-12-04 MED ORDER — CHLORHEXIDINE GLUCONATE CLOTH 2 % EX PADS: 6.0000 | MEDICATED_PAD | Freq: Once | CUTANEOUS | Status: DC

## 2019-12-04 MED ORDER — SODIUM CHLORIDE 0.9 % IV BOLUS
1000.0000 mL | Freq: Once | INTRAVENOUS | Status: AC
  Administered 2019-12-04: 1000 mL via INTRAVENOUS

## 2019-12-04 MED ORDER — DEXAMETHASONE SODIUM PHOSPHATE 10 MG/ML IJ SOLN
INTRAMUSCULAR | Status: AC
  Filled 2019-12-04: qty 1

## 2019-12-04 MED ORDER — MIDAZOLAM HCL 5 MG/5ML IJ SOLN
INTRAMUSCULAR | Status: DC | PRN
  Administered 2019-12-04 (×2): 2 mg via INTRAVENOUS

## 2019-12-04 MED ORDER — PROPOFOL 10 MG/ML IV BOLUS
INTRAVENOUS | Status: AC
  Filled 2019-12-04: qty 20

## 2019-12-04 MED ORDER — ONDANSETRON HCL 4 MG/2ML IJ SOLN
4.0000 mg | Freq: Once | INTRAMUSCULAR | Status: AC
  Administered 2019-12-04: 4 mg via INTRAVENOUS
  Filled 2019-12-04: qty 2

## 2019-12-04 MED ORDER — LACTATED RINGERS IV BOLUS
1000.0000 mL | Freq: Once | INTRAVENOUS | Status: AC
  Administered 2019-12-04: 1000 mL via INTRAVENOUS

## 2019-12-04 MED ORDER — DIPHENHYDRAMINE HCL 50 MG/ML IJ SOLN
INTRAMUSCULAR | Status: DC | PRN
  Administered 2019-12-04 (×2): 25 mg via INTRAVENOUS

## 2019-12-04 MED ORDER — SODIUM CHLORIDE 0.9 % IV SOLN: INTRAVENOUS | Status: DC

## 2019-12-04 MED ORDER — SUCCINYLCHOLINE CHLORIDE 200 MG/10ML IV SOSY
PREFILLED_SYRINGE | INTRAVENOUS | Status: AC
  Filled 2019-12-04: qty 10

## 2019-12-04 MED ORDER — PROPOFOL 1000 MG/100ML IV EMUL
0.0000 ug/kg/min | INTRAVENOUS | Status: DC
  Administered 2019-12-04: 5 ug/kg/min via INTRAVENOUS
  Administered 2019-12-05: 30 ug/kg/min via INTRAVENOUS
  Administered 2019-12-05: 20 ug/kg/min via INTRAVENOUS
  Administered 2019-12-06 (×2): 30 ug/kg/min via INTRAVENOUS
  Administered 2019-12-06 – 2019-12-08 (×7): 40 ug/kg/min via INTRAVENOUS
  Filled 2019-12-04 (×2): qty 100
  Filled 2019-12-04: qty 200
  Filled 2019-12-04 (×9): qty 100

## 2019-12-04 MED ORDER — FENTANYL CITRATE (PF) 100 MCG/2ML IJ SOLN: 50.0000 ug | INTRAMUSCULAR | Status: DC | PRN

## 2019-12-04 MED ORDER — ENOXAPARIN SODIUM 40 MG/0.4ML ~~LOC~~ SOLN
40.0000 mg | SUBCUTANEOUS | Status: DC
  Administered 2019-12-05 – 2019-12-18 (×13): 40 mg via SUBCUTANEOUS
  Filled 2019-12-04 (×13): qty 0.4

## 2019-12-04 MED ORDER — FENTANYL CITRATE (PF) 250 MCG/5ML IJ SOLN
INTRAMUSCULAR | Status: AC
  Filled 2019-12-04: qty 10

## 2019-12-04 MED ORDER — ONDANSETRON HCL 4 MG/2ML IJ SOLN
4.0000 mg | Freq: Four times a day (QID) | INTRAMUSCULAR | Status: DC | PRN
  Administered 2019-12-05 – 2019-12-18 (×6): 4 mg via INTRAVENOUS
  Filled 2019-12-04 (×7): qty 2

## 2019-12-04 MED ORDER — PANTOPRAZOLE SODIUM 40 MG IV SOLR
40.0000 mg | Freq: Every day | INTRAVENOUS | Status: DC
  Administered 2019-12-04 – 2019-12-17 (×14): 40 mg via INTRAVENOUS
  Filled 2019-12-04 (×14): qty 40

## 2019-12-04 MED ORDER — DEXAMETHASONE SODIUM PHOSPHATE 10 MG/ML IJ SOLN
INTRAMUSCULAR | Status: DC | PRN
  Administered 2019-12-04: 10 mg via INTRAVENOUS

## 2019-12-04 MED ORDER — POTASSIUM CHLORIDE 10 MEQ/100ML IV SOLN
10.0000 meq | Freq: Once | INTRAVENOUS | Status: AC
  Administered 2019-12-04: 10 meq via INTRAVENOUS
  Filled 2019-12-04: qty 100

## 2019-12-04 MED ORDER — ORAL CARE MOUTH RINSE
15.0000 mL | OROMUCOSAL | Status: DC
  Administered 2019-12-04 – 2019-12-08 (×34): 15 mL via OROMUCOSAL

## 2019-12-04 MED ORDER — POTASSIUM CHLORIDE CRYS ER 20 MEQ PO TBCR
40.0000 meq | EXTENDED_RELEASE_TABLET | Freq: Once | ORAL | Status: AC
  Administered 2019-12-04: 40 meq via ORAL
  Filled 2019-12-04: qty 2

## 2019-12-04 MED ORDER — MORPHINE SULFATE (PF) 4 MG/ML IV SOLN
4.0000 mg | Freq: Once | INTRAVENOUS | Status: AC
  Administered 2019-12-04: 4 mg via INTRAVENOUS
  Filled 2019-12-04: qty 1

## 2019-12-04 MED ORDER — PHENYLEPHRINE 40 MCG/ML (10ML) SYRINGE FOR IV PUSH (FOR BLOOD PRESSURE SUPPORT)
PREFILLED_SYRINGE | INTRAVENOUS | Status: AC
  Filled 2019-12-04: qty 10

## 2019-12-04 MED ORDER — METRONIDAZOLE IN NACL 5-0.79 MG/ML-% IV SOLN
500.0000 mg | Freq: Once | INTRAVENOUS | Status: AC
  Administered 2019-12-04: 500 mg via INTRAVENOUS
  Filled 2019-12-04: qty 100

## 2019-12-04 MED ORDER — FENTANYL CITRATE (PF) 100 MCG/2ML IJ SOLN
50.0000 ug | INTRAMUSCULAR | Status: DC | PRN
  Administered 2019-12-04: 100 ug via INTRAVENOUS
  Administered 2019-12-05: 50 ug via INTRAVENOUS
  Filled 2019-12-04: qty 2
  Filled 2019-12-04: qty 4

## 2019-12-04 MED ORDER — DIPHENHYDRAMINE HCL 50 MG/ML IJ SOLN
INTRAMUSCULAR | Status: AC
  Filled 2019-12-04: qty 1

## 2019-12-04 MED ORDER — LORAZEPAM 2 MG/ML IJ SOLN
1.0000 mg | INTRAMUSCULAR | Status: DC | PRN
  Administered 2019-12-05 – 2019-12-14 (×12): 1 mg via INTRAVENOUS
  Filled 2019-12-04 (×12): qty 1

## 2019-12-04 MED ORDER — HYDROCORTISONE NA SUCCINATE PF 100 MG IJ SOLR
100.0000 mg | Freq: Once | INTRAMUSCULAR | Status: AC
  Administered 2019-12-04: 100 mg via INTRAVENOUS
  Filled 2019-12-04: qty 2

## 2019-12-04 MED ORDER — PIPERACILLIN-TAZOBACTAM 3.375 G IVPB
3.3750 g | Freq: Three times a day (TID) | INTRAVENOUS | Status: DC
  Administered 2019-12-04 – 2019-12-18 (×42): 3.375 g via INTRAVENOUS
  Filled 2019-12-04 (×43): qty 50

## 2019-12-04 MED ORDER — SODIUM CHLORIDE 0.9 % IV SOLN
2.0000 g | Freq: Once | INTRAVENOUS | Status: AC
  Administered 2019-12-04: 2 g via INTRAVENOUS
  Filled 2019-12-04: qty 2

## 2019-12-04 SURGICAL SUPPLY — 50 items
CANISTER WOUND CARE 500ML ATS (WOUND CARE) ×1 IMPLANT
CLOTH BEACON ORANGE TIMEOUT ST (SAFETY) ×3 IMPLANT
COVER LIGHT HANDLE STERIS (MISCELLANEOUS) ×6 IMPLANT
COVER WAND RF STERILE (DRAPES) ×3 IMPLANT
DRAPE WARM FLUID 44X44 (DRAPES) ×3 IMPLANT
DRSG OPSITE POSTOP 4X10 (GAUZE/BANDAGES/DRESSINGS) ×3 IMPLANT
DRSG VAC ATS LRG SENSATRAC (GAUZE/BANDAGES/DRESSINGS) ×1 IMPLANT
DRSG VERSA FOAM LRG 10X15 (GAUZE/BANDAGES/DRESSINGS) ×2 IMPLANT
DURAPREP 26ML APPLICATOR (WOUND CARE) ×2 IMPLANT
ELECT BLADE 6 FLAT ULTRCLN (ELECTRODE) IMPLANT
ELECT REM PT RETURN 9FT ADLT (ELECTROSURGICAL) ×3
ELECTRODE REM PT RTRN 9FT ADLT (ELECTROSURGICAL) ×2 IMPLANT
GLOVE BIOGEL M 7.0 STRL (GLOVE) ×1 IMPLANT
GLOVE BIOGEL PI IND STRL 6.5 (GLOVE) IMPLANT
GLOVE BIOGEL PI IND STRL 7.0 (GLOVE) ×4 IMPLANT
GLOVE BIOGEL PI INDICATOR 6.5 (GLOVE) ×1
GLOVE BIOGEL PI INDICATOR 7.0 (GLOVE) ×4
GLOVE ECLIPSE 7.0 STRL STRAW (GLOVE) ×1 IMPLANT
GLOVE SURG SS PI 6.5 STRL IVOR (GLOVE) ×1 IMPLANT
GLOVE SURG SS PI 7.5 STRL IVOR (GLOVE) ×3 IMPLANT
GOWN STRL REUS W/TWL LRG LVL3 (GOWN DISPOSABLE) ×10 IMPLANT
HANDLE SUCTION POOLE (INSTRUMENTS) IMPLANT
INST SET MAJOR GENERAL (KITS) ×3 IMPLANT
KIT TURNOVER KIT A (KITS) ×3 IMPLANT
LIGASURE IMPACT 36 18CM CVD LR (INSTRUMENTS) ×3 IMPLANT
MANIFOLD NEPTUNE II (INSTRUMENTS) ×3 IMPLANT
NDL HYPO 18GX1.5 BLUNT FILL (NEEDLE) ×2 IMPLANT
NDL HYPO 21X1.5 SAFETY (NEEDLE) ×2 IMPLANT
NEEDLE HYPO 18GX1.5 BLUNT FILL (NEEDLE) ×3 IMPLANT
NEEDLE HYPO 21X1.5 SAFETY (NEEDLE) ×3 IMPLANT
NS IRRIG 1000ML POUR BTL (IV SOLUTION) ×8 IMPLANT
PACK ABDOMINAL MAJOR (CUSTOM PROCEDURE TRAY) ×1 IMPLANT
PAD ARMBOARD 7.5X6 YLW CONV (MISCELLANEOUS) ×3 IMPLANT
PENCIL SMOKE EVACUATOR (MISCELLANEOUS) ×1 IMPLANT
RELOAD PROXIMATE 75MM BLUE (ENDOMECHANICALS) ×9 IMPLANT
RELOAD STAPLE 75 3.8 BLU REG (ENDOMECHANICALS) IMPLANT
SET BASIN LINEN APH (SET/KITS/TRAYS/PACK) ×3 IMPLANT
SPONGE LAP 18X18 RF (DISPOSABLE) ×3 IMPLANT
STAPLER PROXIMATE 75MM BLUE (STAPLE) ×1 IMPLANT
STAPLER VISISTAT (STAPLE) ×2 IMPLANT
SUCTION POOLE HANDLE (INSTRUMENTS)
SUT CHROMIC 0 SH (SUTURE) IMPLANT
SUT CHROMIC 2 0 SH (SUTURE) IMPLANT
SUT CHROMIC 3 0 SH 27 (SUTURE) IMPLANT
SUT PDS AB 0 CTX 60 (SUTURE) IMPLANT
SUT SILK 2 0 (SUTURE)
SUT SILK 2-0 18XBRD TIE 12 (SUTURE) IMPLANT
SUT SILK 3 0 SH CR/8 (SUTURE) ×1 IMPLANT
SYR 20ML LL LF (SYRINGE) ×6 IMPLANT
TRAY FOLEY MTR SLVR 16FR STAT (SET/KITS/TRAYS/PACK) ×3 IMPLANT

## 2019-12-04 NOTE — ED Notes (Signed)
Anethesiologist in to see pt. Verbal order for bolus liter of LR.

## 2019-12-04 NOTE — Anesthesia Preprocedure Evaluation (Addendum)
Anesthesia Evaluation  Patient identified by MRN, date of birth, ID band Patient awake    Reviewed: Allergy & Precautions, NPO status , Patient's Chart, lab work & pertinent test results  History of Anesthesia Complications Negative for: history of anesthetic complications  Airway Mallampati: II  TM Distance: >3 FB Neck ROM: Full    Dental  (+) Dental Advisory Given, Teeth Intact   Pulmonary neg pulmonary ROS,    Pulmonary exam normal breath sounds clear to auscultation       Cardiovascular Exercise Tolerance: Good hypertension, Pt. on medications  Rhythm:Regular Rate:Tachycardia     Neuro/Psych negative neurological ROS  negative psych ROS   GI/Hepatic Neg liver ROS, GERD  Medicated,Bowel obstruction/perforation    Endo/Other  negative endocrine ROS  Renal/GU negative Renal ROS     Musculoskeletal  (+) Arthritis , Osteoarthritis,    Abdominal   Peds  Hematology negative hematology ROS (+)   Anesthesia Other Findings Patient has hives, itching and swelling all over her extremities. Benadryl 50 mg iv and hydrocortisone 100 mg iv were given and patient started feeling better.  Reproductive/Obstetrics                            Anesthesia Physical Anesthesia Plan  ASA: IV and emergent  Anesthesia Plan: General   Post-op Pain Management:    Induction: Intravenous, Rapid sequence and Cricoid pressure planned  PONV Risk Score and Plan: 4 or greater and Ondansetron, Dexamethasone and Midazolam  Airway Management Planned: Oral ETT  Additional Equipment:   Intra-op Plan:   Post-operative Plan: Possible Post-op intubation/ventilation  Informed Consent: I have reviewed the patients History and Physical, chart, labs and discussed the procedure including the risks, benefits and alternatives for the proposed anesthesia with the patient or authorized representative who has indicated  his/her understanding and acceptance.     Dental advisory given  Plan Discussed with: CRNA and Surgeon  Anesthesia Plan Comments:         Anesthesia Quick Evaluation

## 2019-12-04 NOTE — ED Provider Notes (Signed)
[facility] EMERGENCY DEPARTMENT Provider Note   CSN: [id number] Arrival date & time: 12/04/19  9983     History Chief Complaint  Patient presents with  . Abdominal Pain    [name] is a 59 y.o. female.  HPI      [name] is a 59 y.o. female with past medical history of GERD and hypertension who presents to the Emergency Department complaining of diffuse abdominal pain, nausea, vomiting, and diarrhea.  She states symptoms have been present for nearly 2 weeks.  She also endorses generalized weakness and states her stools have been watery and brown in color.  No mucus or blood.  She reports multiple episodes of vomiting and diarrhea, she called her PCP last week and was given a prescription for Zofran which has helped with her vomiting.  She notes nausea but no further vomiting x3 days.  Continues to have watery brown stools and decreased appetite.  She also complains of some dysuria and pain that occasionally radiates to the right flank.  No fever or chills.  No known Covid exposures, and she has not been vaccinated for the COVID-19 virus.  No recent surgeries of her abdomen. Last meal was last evening.    Past Medical History:  Diagnosis Date  . Anxiety   . Arthritis   . GERD (gastroesophageal reflux disease)   . Hypertension   . S/P endoscopy    2008: noncritical Schatzki ring s/p dilation, normal esophagus and stomach, 2003: normal esophagus, s/p Maloney dilation, multiple antral erosions consistent with chronic gastritis, no H.pylori,     Patient Active Problem List   Diagnosis Date Noted  . 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 Primary osteoarthritis of right knee   . Chest pain 03/02/2018  . Acquired trigger finger of right middle finger   . Knee pain, right 04/06/2015  . Dysphagia 10/09/2010  . GERD (gastroesophageal reflux disease) 10/09/2010  . Encounter for screening colonoscopy 10/09/2010    Past Surgical History:  Procedure  Laterality Date  . CESAREAN SECTION    . CHOLECYSTECTOMY    . COLONOSCOPY  10/17/2010   normal   . ESOPHAGOGASTRODUODENOSCOPY  10/17/2010   non-critical Schatzki's ring s/p dilation, patulous EG junction, small hiatal hernia, otherwise normal stomach, first and second portion of duodenum  . ESOPHAGOGASTRODUODENOSCOPY N/A 10/26/2018   Procedure: ESOPHAGOGASTRODUODENOSCOPY (EGD);  Surgeon: Daneil Dolin, MD;  Location: [facility];  Service: Endoscopy;  Laterality: N/A;  9:15am  . FOOT SURGERY Right    heel spur  . HAND SURGERY Left    carpal tunnel  . KNEE ARTHROSCOPY WITH MEDIAL MENISECTOMY  01/07/2012   Procedure: KNEE ARTHROSCOPY WITH MEDIAL MENISECTOMY;  Surgeon: Sanjuana Kava, MD;  Location: [facility] ORS;  Service: Orthopedics;  Laterality: Right;  . MALONEY DILATION  10/17/2010   Procedure: Venia Minks DILATION;  Surgeon: Daneil Dolin, MD;  Location: [facility] END[facility]ITE;  Service: Endoscopy;  Laterality: N/A;  . Venia Minks DILATION N/A 10/26/2018   Procedure: Venia Minks DILATION;  Surgeon: Daneil Dolin, MD;  Location: [facility];  Service: Endoscopy;  Laterality: N/A;  . TOTAL KNEE ARTHROPLASTY Right 04/18/2019   Procedure: RIGHT TOTAL KNEE ARTHROPLASTY;  Surgeon: Carole Civil, MD;  Location: [facility] ORS;  Service: Orthopedics;  Laterality: Right;  . TRIGGER FINGER RELEASE Right 08/20/2016   Procedure: RELEASE A-1 PULLEY RIGHT LONG FINGER;  Surgeon: Carole Civil, MD;  Location: [facility] ORS;  Service: Orthopedics;  Laterality: Right;     OB  History   No obstetric history on file.     Family History  Problem Relation Age of Onset  . Cancer Father        esophagus ca, living  . Colon cancer Paternal Uncle        in 19s, early 12s  . Colon polyps Neg Hx     Social History   Tobacco Use  . Smoking status: Never Smoker  . Smokeless tobacco: Never Used  Vaping Use  . Vaping Use: Never used  Substance Use Topics  . Alcohol use: Yes    Comment: drinks gin on weekends   . Drug use: No     Home Medications Prior to Admission medications   Medication Sig Start Date End Date Taking? Authorizing Provider  Ascorbic Acid (VITA-C PO) Take 3 tablets by mouth daily. Gummie    [provider]  aspirin 81 MG chewable tablet Chew 1 tablet (81 mg total) by mouth 2 (two) times daily. 04/19/19   Carole Civil, MD  bisacodyl (DULCOLAX) 5 MG EC tablet Take 1 tablet (5 mg total) by mouth daily as needed for moderate constipation. 04/19/19   Carole Civil, MD  Camphor-Menthol-Methyl Sal (TIGER BALM MUSCLE RUB EX) Apply 1 application topically daily as needed (muscle pain).    [provider]  Cholecalciferol (HM VITAMIN D3) 50 MCG (2000 UT) CAPS Take 2,000 Units by mouth daily.    [provider]  diclofenac (VOLTAREN) 75 MG EC tablet Take 75 mg by mouth 2 (two) times daily. Patient not taking: Reported on 10/06/2019    [provider]  docusate sodium (COLACE) 100 MG capsule Take 1 capsule (100 mg total) by mouth 2 (two) times daily. 04/19/19   Carole Civil, MD  ezetimibe (ZETIA) 10 MG tablet Take 10 mg by mouth daily. 04/03/19   [provider]  gabapentin (NEURONTIN) 300 MG capsule Take 1 capsule (300 mg total) by mouth 2 (two) times daily. Patient not taking: Reported on 10/06/2019 10/06/19   Carole Civil, MD  hydrochlorothiazide (HYDRODIURIL) 25 MG tablet Take 25 mg by mouth daily.    [provider]  methocarbamol (ROBAXIN) 500 MG tablet Take 1 tablet (500 mg total) by mouth every 6 (six) hours as needed for muscle spasms. 05/16/19   Carole Civil, MD  Omega-3 Fatty Acids (FISH OIL) 1000 MG CAPS Take 2,000 mg by mouth daily.    [provider]  oxybutynin (DITROPAN XL) 15 MG 24 hr tablet Take 15 mg by mouth 2 (two) times daily.    [provider]  pantoprazole (PROTONIX) 40 MG tablet Take 1 tablet (40 mg total) by mouth 2 (two) times daily before a meal. 03/10/19   Annitta Needs, NP  WEGOVY 0.25  MG/0.5ML SOAJ INJECT 0.25 MG SUBCUTANEOUSLY ONCE WEEKLY ON THE SAME DAY OF [facility] WEEK 09/22/19   [provider]  zinc sulfate 220 (50 Zn) MG capsule Take 220 mg by mouth daily.    [provider]    Allergies    Acetaminophen-codeine  Review of Systems   Review of Systems  Constitutional: Negative for appetite change, chills and fever.  Respiratory: Negative for shortness of breath.   Cardiovascular: Negative for chest pain.  Gastrointestinal: Positive for abdominal pain, diarrhea, nausea and vomiting. Negative for blood in stool.  Genitourinary: Positive for dysuria and flank pain. Negative for decreased urine volume, difficulty urinating, vaginal bleeding and vaginal discharge.  Musculoskeletal: Negative for arthralgias, back pain, neck  pain and neck stiffness.  Skin: Negative for color change and rash.  Neurological: Positive for weakness (Generalized weakness). Negative for dizziness and numbness.  Hematological: Negative for adenopathy.  Psychiatric/Behavioral: Negative for confusion.    Physical Exam Updated Vital Signs BP 101/63   Pulse 98   Temp (!) 97.4 F (36.3 C) (Oral)   Resp 18   Ht 5' 1"  (1.549 m)   Wt 74.8 kg   SpO2 100%   BMI 31.18 kg/m   Physical Exam Vitals and nursing note reviewed.  Constitutional:      Appearance: She is not toxic-appearing.  HENT:     Mouth/Throat:     Mouth: Mucous membranes are dry.     Comments: Mucous membranes are dry Cardiovascular:     Rate and Rhythm: Regular rhythm. Tachycardia present.     Pulses: Normal pulses.  Pulmonary:     Effort: Pulmonary effort is normal.     Breath sounds: Normal breath sounds.  Chest:     Chest wall: No tenderness.  Abdominal:     Tenderness: There is abdominal tenderness. There is no right CVA tenderness, left CVA tenderness or guarding.     Comments: Abdomen is diffusely ttp.  No guarding.  No distention.    Musculoskeletal:        General: Normal range of motion.      Cervical back: Normal range of motion. No rigidity.     Right lower leg: No edema.     Left lower leg: No edema.  Lymphadenopathy:     Cervical: No cervical adenopathy.  Skin:    General: Skin is warm.     Capillary Refill: Capillary refill takes less than 2 seconds.     Findings: No rash.  Neurological:     General: No focal deficit present.     Mental Status: She is alert.     Sensory: No sensory deficit.     Motor: No weakness.     ED Results / Procedures / Treatments   Labs (all labs ordered are listed, but only abnormal results are displayed) Labs Reviewed  COMPREHENSIVE METABOLIC PANEL - Abnormal; Notable for the following components:      Result Value   Sodium 132 (*)    Potassium 2.5 (*)    Chloride 93 (*)    Glucose, Bld 130 (*)    BUN 24 (*)    Calcium 8.2 (*)    Albumin 2.4 (*)    Total Bilirubin 1.6 (*)    All other components within normal limits  URINALYSIS, ROUTINE W REFLEX MICROSCOPIC - Abnormal; Notable for the following components:   Color, Urine AMBER (*)    APPearance CLOUDY (*)    Bilirubin Urine MODERATE (*)    Protein, ur 100 (*)    Bacteria, UA MANY (*)    All other components within normal limits  CBC WITH DIFFERENTIAL/PLATELET - Abnormal; Notable for the following components:   WBC 13.2 (*)    Platelets 437 (*)    Neutro Abs 10.8 (*)    Abs Immature Granulocytes 0.22 (*)    All other components within normal limits  RESPIRATORY PANEL BY RT PCR (FLU A&B, COVID)  CULTURE, BLOOD (SINGLE)  URINE CULTURE  LIPASE, BLOOD    EKG EKG Interpretation  Date/Time:  Monday December 04 2019 15:16:30 EDT Ventricular Rate:  98 PR Interval:    QRS Duration: 94 QT Interval:  400 QTC Calculation: 511 R Axis:   -9 Text Interpretation: Sinus rhythm  Inferior infarct, age indeterminate Prolonged QT interval No significant change since last tracing Confirmed by Fredia Sorrow [phone]) on 12/04/2019 3:40:43 PM   Radiology CT ABDOMEN PELVIS W  CONTRAST  Result Date: 12/04/2019 CLINICAL DATA:  Nausea, vomiting and abdominal pain over the last week. EXAM: CT ABDOMEN AND PELVIS WITH CONTRAST TECHNIQUE: Multidetector CT imaging of the abdomen and pelvis was performed using the standard protocol following bolus administration of intravenous contrast. CONTRAST:  162mL OMNIPAQUE IOHEXOL 300 MG/ML  SOLN COMPARISON:  01/13/2008 FINDINGS: Lower chest: Normal Hepatobiliary: Liver parenchyma appears normal. Previous cholecystectomy. Pancreas: Normal Spleen: Normal Adrenals/Urinary Tract: Adrenal glands are normal. Kidneys are normal. No cyst, mass, stone or hydronephrosis. Bladder is normal. Stomach/Bowel: Bowel perforation with free intraperitoneal air. Abnormal air bubbles within the mesentery. Dilated fluid and air-filled loops of small intestine probably representing small bowel obstruction. Vascular/Lymphatic: Blur no abnormal vascular finding. No adenopathy. Reproductive: No pelvic mass. Other: None Musculoskeletal: Ordinary lumbar degenerative changes. IMPRESSION: Dilated fluid and air-filled loops of small intestine probably representing small bowel obstruction or ileus reactive to the overall process. Bowel perforation with free intraperitoneal air. Abnormal air bubbles within the mesentery. Fluid collections suggesting interloop abscesses. Concern for devitalized bowel, either at the distal small intestine or the right colon. These results were called by telephone at the time of interpretation on 12/04/2019 at 2:35 pm to provider [facility] Ellicia Alix , who verbally acknowledged these results. Electronically Signed   By: Nelson Chimes M.D.   On: 12/04/2019 14:36    Procedures Procedures (including critical care time)  Medications Ordered in ED Medications  sodium chloride 0.9 % bolus 1,000 mL (0 mLs Intravenous Stopped 12/04/19 1253)  ondansetron (ZOFRAN) injection 4 mg (4 mg Intravenous Given 12/04/19 1148)  potassium chloride SA (KLOR-CON) CR tablet  40 mEq (40 mEq Oral Given 12/04/19 1341)  potassium chloride 10 mEq in 100 mL IVPB (10 mEq Intravenous New Bag/Given 12/04/19 1344)  morphine 4 MG/ML injection 4 mg (4 mg Intravenous Given 12/04/19 1341)  iohexol (OMNIPAQUE) 300 MG/ML solution 100 mL (100 mLs Intravenous Contrast Given 12/04/19 1400)    ED Course  I have reviewed the triage vital signs and the nursing notes.  Pertinent labs & imaging results that were available during my care of the patient were reviewed by me and considered in my medical decision making (see chart for details).    MDM Rules/Calculators/A&P                         Patient here with 2-week history of diffuse abdominal pain, nausea, vomiting and diarrhea.  She also reports some intermittent dysuria and right flank pain.  No history of kidney stone.  Initially, she is tachycardic, mucous membranes are dry  She is felt to be dehydrates likely secondary to vomiting diarrhea.  On exam she has diffuse tenderness of her abdomen without notable distention.  Abdomen is without guarding. No recent abdominal surgeries.  Patient does not appear septic.  Labs interpreted by me, shows mild leukocytosis, with left shift.  Hypokalemic with potassium of 2.5.  BUN slightly elevated 24 creatinine within normal limits.  Total bilirubin elevated at 1.6, AST, ALT and alk phos or unremarkable.  Urinalysis shows urine cloudy with moderate bilirubin, proteinuria 21-50 white cells many bacteria, epithelial cells also present.  Is a likely represent contamination.  Urine culture has been sent.  Patient given oral potassium and 10 mEq IV.    1435 spoke with the radiologist concerning  CT findings.  CT shows likely small bowel obstruction with secondary perforation with intraperitoneal air.  Concerning for devitalized bowel likely at the distal small intestine or right colon.  IV Maxipime and Flagyl ordered.  Patient hemodynamically stable.  Initial tachycardia resolved after IV fluids,  systolic blood pressure greater than 100.  She is afebrile.  Covid test, influenza negative.  1500  Consulted general surgery, Dr. Arnoldo Morale and discussed findings.  Will see pt in ER and admit.     Final Clinical Impression(s) / ED Diagnoses Final diagnoses:  Small bowel perforation ([place])  Hypokalemia    Rx / DC Orders ED Discharge Orders    None       Kem Parkinson, PA-C 12/04/19 1641    Truddie Hidden, MD 12/05/19 [phone]

## 2019-12-04 NOTE — Op Note (Addendum)
Patient:  [name]  DOB:  May 22, 1960  MRN:  [id number]   Preop Diagnosis: Pneumoperitoneum, intra-abdominal abscess, possible bowel perforation  Postop Diagnosis: Same, ascending colon perforation, multiple intraloop abscesses  Procedure: Exploratory laparotomy, right hemicolectomy with terminal ileum resection, abdominal washout  Surgeon: Franky Macho, MD  Anes: General endotracheal  Indications: Patient presents to the emergency room with a 2-week history of worsening lower and right-sided abdominal pain, diarrhea, and nausea and vomiting.  CT scan of the abdomen reveals multiple interloop abscesses with free air and evidence of possible perforation of the small or large bowel.  The patient now comes emergently to the operating room for exploratory laparotomy.  The risks and benefits of the procedure including bleeding, infection, the possibility of a bowel resection, the possibility of an ostomy, and the possibility of needing secondary surgery to reconnect the bowel were fully explained to the patient, who gave informed consent.  Procedure note: The patient was placed in the supine position.  After induction of general endotracheal anesthesia, the abdomen was prepped and draped using the usual sterile technique with ChloraPrep.  Surgical site confirmation was performed.  Midline incision was made along most of the abdominal wall.  The peritoneal cavity was entered into without difficulty and purulence chunky fluid was obtained.  Patient did have multiple abscesses within the abdomen consistent with perforation of the bowel.  Aerobic and anaerobic cultures were taken and sent to microbiology.  I continue to try to explore the abdominal cavity but there were multiple interloop's of adhesions and abscess present.  I did start from ligament of Treitz and I was able to finally get to the terminal ileum.  In the proximal ascending colon at the ileocecal juncture, there was an area of multiple  perforations present.  This seemed to be the area of origin.  I then mobilized the right colon along the peritoneal reflection up to the proximal transverse colon.  I also mobilized the small bowel from the ligament of Treitz to the terminal ileum.  There are multiple areas of inflammation and inflamed serosa.  Given the findings, I felt the best thing to do would be to resect the ascending colon as well as the terminal ileum and then bring the patient back in several days for a second look operation.  A GIA stapler was placed across the proximal transverse colon and fired.  This was likewise done across the terminal ileum, 45 cm from the juncture.  Mesentery was then divided using the LigaSure.  The specimen was then removed from the operative field.  The bowel was very edematous and dilated at the end of the procedure.  The abdominal cavity was copiously irrigated with normal saline.  As the patient is coming back to the operating room in 2 days, a white sponge and then black sponge was placed in the surgical incision and a wound VAC was applied. The patient will be transferred back up to the intensive care unit intubated in guarded but stable condition.  Complications: None  EBL: 500 cc  Specimen: Ascending colon and terminal ileum  Aerobic and anaerobic cultures of abdominal deep cavity.  Media Information   Document Information  Photos    12/04/2019 18:43  Attached To:  Hospital Encounter on 12/04/19  Source Information  Franky Macho, MD  [facility]   Media Information   Document Information  Photos    12/04/2019 19:02  Attached To:  Hospital Encounter on 12/04/19  Source Information  Franky Macho, MD  [facility]

## 2019-12-04 NOTE — Transfer of Care (Signed)
Immediate Anesthesia Transfer of Care Note  Patient: [name]  Procedure(s) Performed: EXPLORATORY LAPAROTOMY (N/A ) LAPAROSCOPIC RIGHT HEMI COLECTOMY (Right )  Patient Location: ICU  Anesthesia Type:General  Level of Consciousness: sedated and Patient remains intubated per anesthesia plan  Airway & Oxygen Therapy: Patient remains intubated per anesthesia plan  Post-op Assessment: Report given to RN and Post -op Vital signs reviewed and stable  Post vital signs: Reviewed and stable  Last Vitals:  Vitals Value Taken Time  BP 117/86 12/04/19 1940  Temp 35.9 C 12/04/19 1945  Pulse 124 12/04/19 2004  Resp 14 12/04/19 2004  SpO2 100 % 12/04/19 2004  Vitals shown include unvalidated device data.  Last Pain:  Vitals:   12/04/19 1650  TempSrc: Oral  PainSc: 8       Patients Stated Pain Goal: 5 (12/04/19 1650)  Complications: No complications documented.

## 2019-12-04 NOTE — Anesthesia Procedure Notes (Signed)
Arterial Line Insertion Start/End10/12/2019 7:05 PM, 12/04/2019 7:10 PM Performed by: Molli Barrows, MD, anesthesiologist  Preanesthetic checklist: patient identified, IV checked, site marked, risks and benefits discussed, surgical consent, monitors and equipment checked, pre-op evaluation, timeout performed and anesthesia consent radial was placed Catheter size: 20 G Hand hygiene performed  and maximum sterile barriers used   Attempts: 1 Procedure performed without using ultrasound guided technique. Following insertion, dressing applied. Post procedure assessment: decreased circulation

## 2019-12-04 NOTE — ED Notes (Signed)
CRITICAL VALUE ALERT  Critical Value:  Potassium 2.5  Date & Time Notied: 12/04/19 & 1250hr  Provider Notified: Tammy PA  Orders Received/Actions taken: Notified

## 2019-12-04 NOTE — Anesthesia Procedure Notes (Signed)
Procedure Name: Intubation Date/Time: 12/04/2019 5:35 PM Performed by: Denese Killings, MD Pre-anesthesia Checklist: Patient identified, Emergency Drugs available, Suction available and Patient being monitored Patient Re-evaluated:Patient Re-evaluated prior to induction Oxygen Delivery Method: Circle system utilized Preoxygenation: Pre-oxygenation with 100% oxygen Induction Type: IV induction and Rapid sequence Laryngoscope Size: Mac and 3 Grade View: Grade II Tube type: Oral Tube size: 7.0 mm Number of attempts: 1 Airway Equipment and Method: Stylet Placement Confirmation: ETT inserted through vocal cords under direct vision,  positive ETCO2 and breath sounds checked- equal and bilateral Secured at: 21 cm Tube secured with: Tape Dental Injury: Teeth and Oropharynx as per pre-operative assessment

## 2019-12-04 NOTE — ED Triage Notes (Signed)
Pt reports abdominal pain for over a week with vomiting and diarrhea. She hasn't vomited in 3 days, has had diarrhea every day. States she is very weak

## 2019-12-04 NOTE — H&P (Signed)
[name] is an 59 y.o. female.   Chief Complaint: Abdominal pain, nausea, vomiting HPI: Patient is a 59 year old white female who presented the emergency room with worsening abdominal pain, nausea, vomiting, diarrhea.  She states she has been sick for approximately 2 weeks.  A CT scan of the abdomen was performed which revealed perforated bowel with multiple interloop abscesses and free air.  She states it hurts to move around.  Past Medical History:  Diagnosis Date  . Anxiety   . Arthritis   . GERD (gastroesophageal reflux disease)   . Hypertension   . S/P endoscopy    2008: noncritical Schatzki ring s/p dilation, normal esophagus and stomach, 2003: normal esophagus, s/p Maloney dilation, multiple antral erosions consistent with chronic gastritis, no H.pylori,     Past Surgical History:  Procedure Laterality Date  . CESAREAN SECTION    . CHOLECYSTECTOMY    . COLONOSCOPY  10/17/2010   normal   . ESOPHAGOGASTRODUODENOSCOPY  10/17/2010   non-critical Schatzki's ring s/p dilation, patulous EG junction, small hiatal hernia, otherwise normal stomach, first and second portion of duodenum  . ESOPHAGOGASTRODUODENOSCOPY N/A 10/26/2018   Procedure: ESOPHAGOGASTRODUODENOSCOPY (EGD);  Surgeon: Corbin Ade, MD;  [facility];  Service: Endoscopy;  Laterality: N/A;  9:15am  . FOOT SURGERY Right    heel spur  . HAND SURGERY Left    carpal tunnel  . KNEE ARTHROSCOPY WITH MEDIAL MENISECTOMY  01/07/2012   Procedure: KNEE ARTHROSCOPY WITH MEDIAL MENISECTOMY;  Surgeon: Darreld Mclean, MD;  Location: [facility];  Service: Orthopedics;  Laterality: Right;  . MALONEY DILATION  10/17/2010   Procedure: [name] DILATION;  Surgeon: Corbin Ade, MD;  Location[facility];  Service: Endoscopy;  Laterality: N/A;  . [name] DILATION N/A 10/26/2018   Procedure: [name] DILATION;  Surgeon: Corbin Ade, MD;  Location[facility];  Service: Endoscopy;  Laterality: N/A;  . TOTAL KNEE  ARTHROPLASTY Right 04/18/2019   Procedure: RIGHT TOTAL KNEE ARTHROPLASTY;  Surgeon: Vickki Hearing, MD;  Location: [facility];  Service: Orthopedics;  Laterality: Right;  . TRIGGER FINGER RELEASE Right 08/20/2016   Procedure: RELEASE A-1 PULLEY RIGHT LONG FINGER;  Surgeon: Vickki Hearing, MD;  Location: [facility];  Service: Orthopedics;  Laterality: Right;    Family History  Problem Relation Age of Onset  . Cancer Father        esophagus ca, living  . Colon cancer Paternal Uncle        in 78s, early 85s  . Colon polyps Neg Hx    Social History:  reports that she has never smoked. She has never used smokeless tobacco. She reports current alcohol use. She reports that she does not use drugs.  Allergies:  Allergies  Allergen Reactions  . Acetaminophen-Codeine Rash    Tylenol with Codeine Tylenol #3,    (Not in a hospital admission)   Results for orders placed or performed during the hospital encounter of 12/04/19 (from the past 48 hour(s))  Lipase, blood     Status: None   Collection Time: 12/04/19 10:55 AM  Result Value Ref Range   Lipase 26 11 - 51 U/L    Comment: Performed at [facility], [address] [place], [place] [address]  Comprehensive metabolic panel     Status: Abnormal   Collection Time: 12/04/19 10:55 AM  Result Value Ref Range   Sodium 132 (L) 135 - 145 mmol/L   Potassium 2.5 (LL) 3.5 - 5.1 mmol/L  Comment: CRITICAL RESULT CALLED TO, READ BACK BY AND VERIFIED WITH: LONG,L AT 1253 ON 10.11.21 BY ISLEY,B    Chloride 93 (L) 98 - 111 mmol/L   CO2 26 22 - 32 mmol/L   Glucose, Bld 130 (H) 70 - 99 mg/dL    Comment: Glucose reference range applies only to samples taken after fasting for at least 8 hours.   BUN 24 (H) 6 - 20 mg/dL   Creatinine, Ser 4.69 0.44 - 1.00 mg/dL   Calcium 8.2 (L) 8.9 - 10.3 mg/dL   Total Protein 7.3 6.5 - 8.1 g/dL   Albumin 2.4 (L) 3.5 - 5.0 g/dL   AST 24 15 - 41 U/L   ALT 24 0 - 44 U/L   Alkaline Phosphatase 77 38 - 126 U/L    Total Bilirubin 1.6 (H) 0.3 - 1.2 mg/dL   GFR, Estimated >62 >95 mL/min   Anion gap 13 5 - 15    Comment: Performed at [facility], [address], [place], [place] [address]  Urinalysis, Routine w reflex microscopic Urine, Random     Status: Abnormal   Collection Time: 12/04/19 10:55 AM  Result Value Ref Range   Color, Urine AMBER (A) YELLOW    Comment: BIOCHEMICALS MAY BE AFFECTED BY COLOR   APPearance CLOUDY (A) CLEAR   Specific Gravity, Urine 1.023 1.005 - 1.030   pH 5.0 5.0 - 8.0   Glucose, UA NEGATIVE NEGATIVE mg/dL   Hgb urine dipstick NEGATIVE NEGATIVE   Bilirubin Urine MODERATE (A) NEGATIVE   Ketones, ur NEGATIVE NEGATIVE mg/dL   Protein, ur 244 (A) NEGATIVE mg/dL   Nitrite NEGATIVE NEGATIVE   Leukocytes,Ua NEGATIVE NEGATIVE   RBC / HPF 0-5 0 - 5 RBC/hpf   WBC, UA 21-50 0 - 5 WBC/hpf   Bacteria, UA MANY (A) NONE SEEN   Squamous Epithelial / LPF 21-50 0 - 5   WBC Clumps PRESENT    Mucus PRESENT    Hyaline Casts, UA PRESENT     Comment: Performed at [facility], [address], [place], [place] [address]  CBC with Differential     Status: Abnormal   Collection Time: 12/04/19 11:20 AM  Result Value Ref Range   WBC 13.2 (H) 4.0 - 10.5 K/uL   RBC 4.37 3.87 - 5.11 MIL/uL   Hemoglobin 12.6 12.0 - 15.0 g/dL   HCT 25.3 36 - 46 %   MCV 87.4 80.0 - 100.0 fL   MCH 28.8 26.0 - 34.0 pg   MCHC 33.0 30.0 - 36.0 g/dL   RDW 66.4 40.3 - 47.4 %   Platelets 437 (H) 150 - 400 K/uL   nRBC 0.0 0.0 - 0.2 %   Neutrophils Relative % 82 %   Neutro Abs 10.8 (H) 1.7 - 7.7 K/uL   Lymphocytes Relative 10 %   Lymphs Abs 1.3 0.7 - 4.0 K/uL   Monocytes Relative 6 %   Monocytes Absolute 0.8 0.1 - 1.0 K/uL   Eosinophils Relative 0 %   Eosinophils Absolute 0.0 0 - 0 K/uL   Basophils Relative 0 %   Basophils Absolute 0.0 0 - 0 K/uL   WBC Morphology MILD LEFT SHIFT (1-5% METAS, OCC MYELO, OCC BANDS)    Immature Granulocytes 2 %   Abs Immature Granulocytes 0.22 (H) 0.00 - 0.07 K/uL    Comment:  Performed at [facility], [address] [place], [place] [address]  Respiratory Panel by RT PCR (Flu A&B, Covid) - Nasopharyngeal Swab  Status: None   Collection Time: 12/04/19 11:22 AM   Specimen: Nasopharyngeal Swab  Result Value Ref Range   SARS Coronavirus 2 by RT PCR NEGATIVE NEGATIVE    Comment: (NOTE) SARS-CoV-2 target nucleic acids are NOT DETECTED.  The SARS-CoV-2 RNA is generally detectable in upper respiratoy specimens during the acute phase of infection. The lowest concentration of SARS-CoV-2 viral copies this assay can detect is 131 copies/mL. A negative result does not preclude SARS-Cov-2 infection and should not be used as the sole basis for treatment or other patient management decisions. A negative result may occur with  improper specimen collection/handling, submission of specimen other than nasopharyngeal swab, presence of viral mutation(s) within the areas targeted by this assay, and inadequate number of viral copies (<131 copies/mL). A negative result must be combined with clinical observations, patient history, and epidemiological information. The expected result is Negative.  Fact Sheet for Patients:  https://www.moore.com/https://www.fda.gov/media/142436/download  Fact Sheet for Healthcare Providers:  https://www.young.biz/https://www.fda.gov/media/142435/download  This test is no t yet approved or cleared by the [place] FDA and  has been authorized for detection and/or diagnosis of SARS-CoV-2 by FDA under an Emergency Use Authorization (EUA). This EUA will remain  in effect (meaning this test can be used) for the duration of the COVID-19 declaration under Section 564(b)(1) of the Act, 21 U.S.C. section 360bbb-3(b)(1), unless the authorization is terminated or revoked sooner.     Influenza A by PCR NEGATIVE NEGATIVE   Influenza B by PCR NEGATIVE NEGATIVE    Comment: (NOTE) The Xpert Xpress SARS-CoV-2/FLU/RSV assay is intended as an aid in  the diagnosis of influenza from  Nasopharyngeal swab specimens and  should not be used as a sole basis for treatment. Nasal washings and  aspirates are unacceptable for Xpert Xpress SARS-CoV-2/FLU/RSV  testing.  Fact Sheet for Patients: https://www.moore.com/https://www.fda.gov/media/142436/download  Fact Sheet for Healthcare Providers: https://www.young.biz/https://www.fda.gov/media/142435/download  This test is not yet approved or cleared by the [place] FDA and  has been authorized for detection and/or diagnosis of SARS-CoV-2 by  FDA under an Emergency Use Authorization (EUA). This EUA will remain  in effect (meaning this test can be used) for the duration of the  Covid-19 declaration under Section 564(b)(1) of the Act, 21  U.S.C. section 360bbb-3(b)(1), unless the authorization is  terminated or revoked. Performed at [facility], [address] [place], [place] [id number]    CT ABDOMEN PELVIS W CONTRAST  Result Date: 12/04/2019 CLINICAL DATA:  Nausea, vomiting and abdominal pain over the last week. EXAM: CT ABDOMEN AND PELVIS WITH CONTRAST TECHNIQUE: Multidetector CT imaging of the abdomen and pelvis was performed using the standard protocol following bolus administration of intravenous contrast. CONTRAST:  100mL OMNIPAQUE IOHEXOL 300 MG/ML  SOLN COMPARISON:  01/13/2008 FINDINGS: Lower chest: Normal Hepatobiliary: Liver parenchyma appears normal. Previous cholecystectomy. Pancreas: Normal Spleen: Normal Adrenals/Urinary Tract: Adrenal glands are normal. Kidneys are normal. No cyst, mass, stone or hydronephrosis. Bladder is normal. Stomach/Bowel: Bowel perforation with free intraperitoneal air. Abnormal air bubbles within the mesentery. Dilated fluid and air-filled loops of small intestine probably representing small bowel obstruction. Vascular/Lymphatic: Blur no abnormal vascular finding. No adenopathy. Reproductive: No pelvic mass. Other: None Musculoskeletal: Ordinary lumbar degenerative changes. IMPRESSION: Dilated fluid and air-filled loops of small  intestine probably representing small bowel obstruction or ileus reactive to the overall process. Bowel perforation with free intraperitoneal air. Abnormal air bubbles within the mesentery. Fluid collections suggesting interloop abscesses. Concern for devitalized bowel, either at the distal small intestine or the right colon.  These results were called by telephone at the time of interpretation on 12/04/2019 at 2:35 pm to provider [facility] TRIPLETT , who verbally acknowledged these results. Electronically Signed   By: Paulina Fusi M.D.   On: 12/04/2019 14:36    Review of Systems  Constitutional: Positive for chills, fatigue and fever.  HENT: Negative.   Eyes: Negative.   Respiratory: Negative.   Cardiovascular: Negative.   Gastrointestinal: Positive for abdominal pain, diarrhea, nausea and vomiting.  Endocrine: Negative.   Genitourinary: Negative.   Musculoskeletal: Negative.   Skin: Negative.   Allergic/Immunologic: Negative.   Neurological: Negative.   Hematological: Negative.   Psychiatric/Behavioral: Negative.     Blood pressure 107/67, pulse 99, temperature (!) 97.4 F (36.3 C), temperature source Oral, resp. rate 20, height 5\' 1"  (1.549 m), weight 74.8 kg, SpO2 100 %. Physical Exam Vitals reviewed.  Constitutional:      Appearance: She is well-developed. She is ill-appearing.  HENT:     Head: Normocephalic and atraumatic.  Cardiovascular:     Rate and Rhythm: Normal rate and regular rhythm.     Heart sounds: Normal heart sounds. No murmur heard.  No friction rub. No gallop.   Pulmonary:     Effort: Pulmonary effort is normal. No respiratory distress.     Breath sounds: Normal breath sounds. No stridor. No wheezing, rhonchi or rales.  Abdominal:     General: Bowel sounds are decreased. There is no distension.     Tenderness: There is abdominal tenderness.     Hernia: No hernia is present.     Comments: Diffuse tenderness is noted throughout the abdomen.  Skin:    General:  Skin is warm and dry.  Neurological:     Mental Status: She is alert.   CT scan images personally reviewed  Assessment/Plan Impression: Peritonitis with free fluid, free air consistent with bowel perforation. Plan: We will take patient emergently to the operating room for an exploratory laparotomy.  This may include bowel resection, return to the OR in a few days, and/or ostomy given the findings.  The risks and benefits of the procedure including bleeding, infection, cardiopulmonary difficulties, the possibility of return to the operating room were fully explained to the patient, who gave informed consent.  She was told I will have to leave her wound open given the extent of the infection. , MD 12/04/2019, 3:56 PM

## 2019-12-04 NOTE — OR Nursing (Signed)
Transported to Preop via stretcher from ER .  Up to bathroom to void. complaints of hand and feet swelling.  Hives noted .  Complaints of itching .  Hives noted all over arms legs and moving up to abdomen.  Ddr. Pilar Plate in to see patient meds given to counteract   Hives ,  itching

## 2019-12-05 LAB — BLOOD GAS, ARTERIAL
Acid-base deficit: 9.6 mmol/L — ABNORMAL HIGH (ref 0.0–2.0)
Bicarbonate: 17.3 mmol/L — ABNORMAL LOW (ref 20.0–28.0)
FIO2: 40
O2 Saturation: 97.6 %
Patient temperature: 37
pCO2 arterial: 23.9 mmHg — ABNORMAL LOW (ref 32.0–48.0)
pH, Arterial: 7.395 (ref 7.350–7.450)
pO2, Arterial: 156 mmHg — ABNORMAL HIGH (ref 83.0–108.0)

## 2019-12-05 LAB — GLUCOSE, CAPILLARY
Glucose-Capillary: 131 mg/dL — ABNORMAL HIGH (ref 70–99)
Glucose-Capillary: 141 mg/dL — ABNORMAL HIGH (ref 70–99)
Glucose-Capillary: 170 mg/dL — ABNORMAL HIGH (ref 70–99)
Glucose-Capillary: 170 mg/dL — ABNORMAL HIGH (ref 70–99)
Glucose-Capillary: 177 mg/dL — ABNORMAL HIGH (ref 70–99)
Glucose-Capillary: 179 mg/dL — ABNORMAL HIGH (ref 70–99)

## 2019-12-05 LAB — COMPREHENSIVE METABOLIC PANEL
ALT: 21 U/L (ref 0–44)
AST: 31 U/L (ref 15–41)
Albumin: 1.6 g/dL — ABNORMAL LOW (ref 3.5–5.0)
Alkaline Phosphatase: 64 U/L (ref 38–126)
Anion gap: 14 (ref 5–15)
BUN: 18 mg/dL (ref 6–20)
CO2: 15 mmol/L — ABNORMAL LOW (ref 22–32)
Calcium: 7.1 mg/dL — ABNORMAL LOW (ref 8.9–10.3)
Chloride: 106 mmol/L (ref 98–111)
Creatinine, Ser: 0.61 mg/dL (ref 0.44–1.00)
GFR, Estimated: >60 mL/min (ref >60)
Glucose, Bld: 128 mg/dL — ABNORMAL HIGH (ref 70–99)
Potassium: 4.7 mmol/L (ref 3.5–5.1)
Sodium: 135 mmol/L (ref 135–145)
Total Bilirubin: 1.9 mg/dL — ABNORMAL HIGH (ref 0.3–1.2)
Total Protein: 4.7 g/dL — ABNORMAL LOW (ref 6.5–8.1)

## 2019-12-05 LAB — CBC
HCT: 34.1 % — ABNORMAL LOW (ref 36.0–46.0)
Hemoglobin: 11.2 g/dL — ABNORMAL LOW (ref 12.0–15.0)
MCH: 28.9 pg (ref 26.0–34.0)
MCHC: 32.8 g/dL (ref 30.0–36.0)
MCV: 88.1 fL (ref 80.0–100.0)
Platelets: 424 10*3/uL — ABNORMAL HIGH (ref 150–400)
RBC: 3.87 MIL/uL (ref 3.87–5.11)
RDW: 14.6 % (ref 11.5–15.5)
WBC: 17 10*3/uL — ABNORMAL HIGH (ref 4.0–10.5)
nRBC: 0 % (ref 0.0–0.2)

## 2019-12-05 LAB — PHOSPHORUS: Phosphorus: 3.7 mg/dL (ref 2.5–4.6)

## 2019-12-05 LAB — HEMOGLOBIN AND HEMATOCRIT, BLOOD
HCT: 31.3 % — ABNORMAL LOW (ref 36.0–46.0)
Hemoglobin: 10.2 g/dL — ABNORMAL LOW (ref 12.0–15.0)

## 2019-12-05 LAB — MAGNESIUM: Magnesium: 1.7 mg/dL (ref 1.7–2.4)

## 2019-12-05 LAB — TRIGLYCERIDES: Triglycerides: 70 mg/dL (ref <150)

## 2019-12-05 MED ORDER — CHLORHEXIDINE GLUCONATE CLOTH 2 % EX PADS
6.0000 | MEDICATED_PAD | Freq: Every day | CUTANEOUS | Status: DC
  Administered 2019-12-05 – 2019-12-18 (×13): 6 via TOPICAL

## 2019-12-05 NOTE — Progress Notes (Signed)
Patient's BP began to drop approximately 2315, as low as 75/40. Patient has also had 375 mL output in hemovac since arrival to unit. Sedation on hold intermittently to avoid further pressure decrease. Contacted Dr. Lovell Sheehan to make him aware of situation. Stat H&H as well as LR bolus ordered. BP currently more stable in 95/50 range (65). Sedation restarted at low rate. Awaiting results of lab draw. Will continue to monitor.

## 2019-12-05 NOTE — Progress Notes (Signed)
1 Day Post-Op  Subjective: Intubated but awake.  Objective: Vital signs in last 24 hours: Temp:  [96.7 F (35.9 C)-98.7 F (37.1 C)] 98.7 F (37.1 C) (10/12 0431) Pulse Rate:  [84-126] 123 (10/12 0730) Resp:  [14-33] 33 (10/12 0730) BP: (94-118)/(51-86) 118/52 (10/11 2100) SpO2:  [98 %-100 %] 99 % (10/12 0755) Arterial Line BP: (80-132)/(40-76) 101/51 (10/12 0730) FiO2 (%):  [40 %-60 %] 40 % (10/12 0755) Weight:  [74.8 kg-80.5 kg] 80.5 kg (10/12 0431)    Intake/Output from previous day: 10/11 0701 - 10/12 0700 In: 6680.4 [I.V.:3732.5; IV Piggyback:2947.9] Out: 1475 [Urine:400; Drains:475; Blood:500] Intake/Output this shift: Total I/O In: 760.1 [I.V.:724.6; IV Piggyback:35.5] Out: -   General appearance: cooperative, no distress and Intubated Resp: clear to auscultation bilaterally Cardio: Sinus tachycardia.  No S3 or S4. GI: Soft, wound VAC in place.  Lab Results:  Recent Labs    12/04/19 1120 12/04/19 1120 12/05/19 0020 12/05/19 0414  WBC 13.2*  --   --  17.0*  HGB 12.6   < > 10.2* 11.2*  HCT 38.2   < > 31.3* 34.1*  PLT 437*  --   --  424*   < > = values in this interval not displayed.   BMET Recent Labs    12/04/19 1055 12/05/19 0414  NA 132* 135  K 2.5* 4.7  CL 93* 106  CO2 26 15*  GLUCOSE 130* 128*  BUN 24* 18  CREATININE 0.91 0.61  CALCIUM 8.2* 7.1*   PT/INR No results for input(s): LABPROT, INR in the last 72 hours.  Studies/Results: CT ABDOMEN PELVIS W CONTRAST  Result Date: 12/04/2019 CLINICAL DATA:  Nausea, vomiting and abdominal pain over the last week. EXAM: CT ABDOMEN AND PELVIS WITH CONTRAST TECHNIQUE: Multidetector CT imaging of the abdomen and pelvis was performed using the standard protocol following bolus administration of intravenous contrast. CONTRAST:  OMNIPAQUE IOHEXOL 300 MG/ML  SOLN COMPARISON:  01/13/2008 FINDINGS: Lower chest: Normal Hepatobiliary: Liver parenchyma appears normal. Previous cholecystectomy. Pancreas:  Normal Spleen: Normal Adrenals/Urinary Tract: Adrenal glands are normal. Kidneys are normal. No cyst, mass, stone or hydronephrosis. Bladder is normal. Stomach/Bowel: Bowel perforation with free intraperitoneal air. Abnormal air bubbles within the mesentery. Dilated fluid and air-filled loops of small intestine probably representing small bowel obstruction. Vascular/Lymphatic: Blur no abnormal vascular finding. No adenopathy. Reproductive: No pelvic mass. Other: None Musculoskeletal: Ordinary lumbar degenerative changes. IMPRESSION: Dilated fluid and air-filled loops of small intestine probably representing small bowel obstruction or ileus reactive to the overall process. Bowel perforation with free intraperitoneal air. Abnormal air bubbles within the mesentery. Fluid collections suggesting interloop abscesses. Concern for devitalized bowel, either at the distal small intestine or the right colon. These results were called by telephone at the time of interpretation on 12/04/2019 at 2:35 pm to provider [facility] TRIPLETT , who verbally acknowledged these results. Electronically Signed   By: Paulina Fusi M.D.   On: 12/04/2019 14:36   DG CHEST PORT 1 VIEW  Result Date: 12/04/2019 CLINICAL DATA:  ETT and OG tube placement. Negative covid-19 test today. Hx of HTN. EXAM: PORTABLE CHEST 1 VIEW COMPARISON:  Chest x-ray 10/22/2015 report without images FINDINGS: Endotracheal tube with tip approximately 6 cm above the carina. Enteric tube noted coursing below the diaphragm with tip and side port collimated off view. The heart size and mediastinal contours are within normal limits. No focal consolidation. No pulmonary edema. No pleural effusion. No pneumothorax. No acute osseous abnormality. Degenerative changes of bilateral shoulders. Degenerative  changes of the spine. IMPRESSION: 1. Endotracheal tube with tip approximately 6 cm above the carina. 2. Enteric tube courses below the diaphragm with tip and side port collimated  off view. 3. No acute cardiopulmonary abnormality. Electronically Signed   By: Tish Frederickson M.D.   On: 12/04/2019 20:50    Anti-infectives: Anti-infectives (From admission, onward)   Start     Dose/Rate Route Frequency Ordered Stop   12/04/19 2030  piperacillin-tazobactam (ZOSYN) IVPB 3.375 g        3.375 g 12.5 mL/hr over 240 Minutes Intravenous Every 8 hours 12/04/19 2004     12/04/19 1500  ceFEPIme (MAXIPIME) 2 g in sodium chloride 0.9 % 100 mL IVPB        2 g 200 mL/hr over 30 Minutes Intravenous  Once 12/04/19 1455 12/04/19 1602   12/04/19 1500  metroNIDAZOLE (FLAGYL) IVPB 500 mg        500 mg 100 mL/hr over 60 Minutes Intravenous  Once 12/04/19 1455 12/04/19 1635      Assessment/Plan: s/p Procedure(s): EXPLORATORY LAPAROTOMY Impression: Stable, status post exploratory laparotomy with right hemicolectomy, though anastomosis could not be performed.  Patient was left intubated as she will be returning to the operating room tomorrow for exploratory laparotomy and hopefully reconnected.  Will place central line at that time.  Blood pressure remains soft, but she does not need pressor support at this time.  Will get ABG.  LOS: 1 day    Franky Macho 12/05/2019

## 2019-12-05 NOTE — Anesthesia Postprocedure Evaluation (Signed)
Anesthesia Post Note  Patient: [name]  Procedure(s) Performed: EXPLORATORY LAPAROTOMY (N/A ) LAPAROSCOPIC RIGHT HEMI COLECTOMY (Right )  Patient location during evaluation: ICU Anesthesia Type: General Level of consciousness: patient remains intubated per anesthesia plan Pain management: pain level controlled Vital Signs Assessment: post-procedure vital signs reviewed and stable Respiratory status: patient remains intubated per anesthesia plan Cardiovascular status: stable Anesthetic complications: no   No complications documented.   Last Vitals:  Vitals:   12/04/19 2200 12/04/19 2230  BP:    Pulse: (!) 121 (!) 105  Resp: (!) 26 (!) 25  Temp:    SpO2: 100% 100%    Last Pain:  Vitals:   12/04/19 2231  TempSrc:   PainSc: Asleep                 Madeliene Tejera C Crystale Giannattasio

## 2019-12-05 NOTE — H&P (View-Only) (Signed)
1 Day Post-Op  Subjective: Intubated but awake.  Objective: Vital signs in last 24 hours: Temp:  [96.7 F (35.9 C)-98.7 F (37.1 C)] 98.7 F (37.1 C) (10/12 0431) Pulse Rate:  [84-126] 123 (10/12 0730) Resp:  [14-33] 33 (10/12 0730) BP: (94-118)/(51-86) 118/52 (10/11 2100) SpO2:  [98 %-100 %] 99 % (10/12 0755) Arterial Line BP: (80-132)/(40-76) 101/51 (10/12 0730) FiO2 (%):  [40 %-60 %] 40 % (10/12 0755) Weight:  [74.8 kg-80.5 kg] 80.5 kg (10/12 0431)    Intake/Output from previous day: 10/11 0701 - 10/12 0700 In: 6680.4 [I.V.:3732.5; IV Piggyback:2947.9] Out: 1475 [Urine:400; Drains:475; Blood:500] Intake/Output this shift: Total I/O In: 760.1 [I.V.:724.6; IV Piggyback:35.5] Out: -   General appearance: cooperative, no distress and Intubated Resp: clear to auscultation bilaterally Cardio: Sinus tachycardia.  No S3 or S4. GI: Soft, wound VAC in place.  Lab Results:  Recent Labs    12/04/19 1120 12/04/19 1120 12/05/19 0020 12/05/19 0414  WBC 13.2*  --   --  17.0*  HGB 12.6   < > 10.2* 11.2*  HCT 38.2   < > 31.3* 34.1*  PLT 437*  --   --  424*   < > = values in this interval not displayed.   BMET Recent Labs    12/04/19 1055 12/05/19 0414  NA 132* 135  K 2.5* 4.7  CL 93* 106  CO2 26 15*  GLUCOSE 130* 128*  BUN 24* 18  CREATININE 0.91 0.61  CALCIUM 8.2* 7.1*   PT/INR No results for input(s): LABPROT, INR in the last 72 hours.  Studies/Results: CT ABDOMEN PELVIS W CONTRAST  Result Date: 12/04/2019 CLINICAL DATA:  Nausea, vomiting and abdominal pain over the last week. EXAM: CT ABDOMEN AND PELVIS WITH CONTRAST TECHNIQUE: Multidetector CT imaging of the abdomen and pelvis was performed using the standard protocol following bolus administration of intravenous contrast. CONTRAST:  100mL OMNIPAQUE IOHEXOL 300 MG/ML  SOLN COMPARISON:  01/13/2008 FINDINGS: Lower chest: Normal Hepatobiliary: Liver parenchyma appears normal. Previous cholecystectomy. Pancreas:  Normal Spleen: Normal Adrenals/Urinary Tract: Adrenal glands are normal. Kidneys are normal. No cyst, mass, stone or hydronephrosis. Bladder is normal. Stomach/Bowel: Bowel perforation with free intraperitoneal air. Abnormal air bubbles within the mesentery. Dilated fluid and air-filled loops of small intestine probably representing small bowel obstruction. Vascular/Lymphatic: Blur no abnormal vascular finding. No adenopathy. Reproductive: No pelvic mass. Other: None Musculoskeletal: Ordinary lumbar degenerative changes. IMPRESSION: Dilated fluid and air-filled loops of small intestine probably representing small bowel obstruction or ileus reactive to the overall process. Bowel perforation with free intraperitoneal air. Abnormal air bubbles within the mesentery. Fluid collections suggesting interloop abscesses. Concern for devitalized bowel, either at the distal small intestine or the right colon. These results were called by telephone at the time of interpretation on 12/04/2019 at 2:35 pm to provider TAMMY TRIPLETT , who verbally acknowledged these results. Electronically Signed   By: Mang Hazelrigg  Shogry M.D.   On: 12/04/2019 14:36   DG CHEST PORT 1 VIEW  Result Date: 12/04/2019 CLINICAL DATA:  ETT and OG tube placement. Negative covid-19 test today. Hx of HTN. EXAM: PORTABLE CHEST 1 VIEW COMPARISON:  Chest x-ray 10/22/2015 report without images FINDINGS: Endotracheal tube with tip approximately 6 cm above the carina. Enteric tube noted coursing below the diaphragm with tip and side port collimated off view. The heart size and mediastinal contours are within normal limits. No focal consolidation. No pulmonary edema. No pleural effusion. No pneumothorax. No acute osseous abnormality. Degenerative changes of bilateral shoulders. Degenerative   changes of the spine. IMPRESSION: 1. Endotracheal tube with tip approximately 6 cm above the carina. 2. Enteric tube courses below the diaphragm with tip and side port collimated  off view. 3. No acute cardiopulmonary abnormality. Electronically Signed   By: Tish Frederickson M.D.   On: 12/04/2019 20:50    Anti-infectives: Anti-infectives (From admission, onward)   Start     Dose/Rate Route Frequency Ordered Stop   12/04/19 2030  piperacillin-tazobactam (ZOSYN) IVPB 3.375 g        3.375 g 12.5 mL/hr over 240 Minutes Intravenous Every 8 hours 12/04/19 2004     12/04/19 1500  ceFEPIme (MAXIPIME) 2 g in sodium chloride 0.9 % 100 mL IVPB        2 g 200 mL/hr over 30 Minutes Intravenous  Once 12/04/19 1455 12/04/19 1602   12/04/19 1500  metroNIDAZOLE (FLAGYL) IVPB 500 mg        500 mg 100 mL/hr over 60 Minutes Intravenous  Once 12/04/19 1455 12/04/19 1635      Assessment/Plan: s/p Procedure(s): EXPLORATORY LAPAROTOMY Impression: Stable, status post exploratory laparotomy with right hemicolectomy, though anastomosis could not be performed.  Patient was left intubated as she will be returning to the operating room tomorrow for exploratory laparotomy and hopefully reconnected.  Will place central line at that time.  Blood pressure remains soft, but she does not need pressor support at this time.  Will get ABG.  LOS: 1 day    Franky Macho 12/05/2019

## 2019-12-06 ENCOUNTER — Inpatient Hospital Stay (HOSPITAL_COMMUNITY): Payer: BC Managed Care – PPO

## 2019-12-06 ENCOUNTER — Encounter (HOSPITAL_COMMUNITY): Payer: Self-pay | Admitting: General Surgery

## 2019-12-06 ENCOUNTER — Inpatient Hospital Stay (HOSPITAL_COMMUNITY): Payer: BC Managed Care – PPO | Admitting: Anesthesiology

## 2019-12-06 ENCOUNTER — Encounter (HOSPITAL_COMMUNITY)
Admission: EM | Disposition: A | Discharge status: Nursing Home (Includes ICF) | Payer: Self-pay | Source: Home / Self Care | Attending: General Surgery

## 2019-12-06 DIAGNOSIS — K631 Perforation of intestine (nontraumatic): Secondary | ICD-10-CM

## 2019-12-06 HISTORY — PX: BOWEL RESECTION: SHX1257

## 2019-12-06 HISTORY — PX: CENTRAL LINE INSERTION: CATH118232

## 2019-12-06 HISTORY — PX: LAPAROTOMY: SHX154

## 2019-12-06 LAB — GLUCOSE, CAPILLARY
Glucose-Capillary: 137 mg/dL — ABNORMAL HIGH (ref 70–99)
Glucose-Capillary: 146 mg/dL — ABNORMAL HIGH (ref 70–99)
Glucose-Capillary: 154 mg/dL — ABNORMAL HIGH (ref 70–99)
Glucose-Capillary: 158 mg/dL — ABNORMAL HIGH (ref 70–99)
Glucose-Capillary: 167 mg/dL — ABNORMAL HIGH (ref 70–99)

## 2019-12-06 LAB — COMPREHENSIVE METABOLIC PANEL
ALT: 19 U/L (ref 0–44)
AST: 27 U/L (ref 15–41)
Albumin: 1.2 g/dL — ABNORMAL LOW (ref 3.5–5.0)
Alkaline Phosphatase: 56 U/L (ref 38–126)
Anion gap: 9 (ref 5–15)
BUN: 25 mg/dL — ABNORMAL HIGH (ref 6–20)
CO2: 18 mmol/L — ABNORMAL LOW (ref 22–32)
Calcium: 6.8 mg/dL — ABNORMAL LOW (ref 8.9–10.3)
Chloride: 111 mmol/L (ref 98–111)
Creatinine, Ser: 0.8 mg/dL (ref 0.44–1.00)
GFR, Estimated: >60 mL/min (ref >60)
Glucose, Bld: 158 mg/dL — ABNORMAL HIGH (ref 70–99)
Potassium: 4 mmol/L (ref 3.5–5.1)
Sodium: 138 mmol/L (ref 135–145)
Total Bilirubin: 0.9 mg/dL (ref 0.3–1.2)
Total Protein: 3.9 g/dL — ABNORMAL LOW (ref 6.5–8.1)

## 2019-12-06 LAB — CBC
HCT: 27.8 % — ABNORMAL LOW (ref 36.0–46.0)
Hemoglobin: 8.9 g/dL — ABNORMAL LOW (ref 12.0–15.0)
MCH: 28.7 pg (ref 26.0–34.0)
MCHC: 32 g/dL (ref 30.0–36.0)
MCV: 89.7 fL (ref 80.0–100.0)
Platelets: 319 10*3/uL (ref 150–400)
RBC: 3.1 MIL/uL — ABNORMAL LOW (ref 3.87–5.11)
RDW: 15 % (ref 11.5–15.5)
WBC: 14.9 10*3/uL — ABNORMAL HIGH (ref 4.0–10.5)
nRBC: 0 % (ref 0.0–0.2)

## 2019-12-06 LAB — PHOSPHORUS: Phosphorus: 3.8 mg/dL (ref 2.5–4.6)

## 2019-12-06 LAB — TRIGLYCERIDES: Triglycerides: 122 mg/dL (ref <150)

## 2019-12-06 LAB — MAGNESIUM: Magnesium: 1.7 mg/dL (ref 1.7–2.4)

## 2019-12-06 LAB — URINE CULTURE: Culture: NO GROWTH

## 2019-12-06 LAB — SURGICAL PATHOLOGY

## 2019-12-06 SURGERY — LAPAROTOMY, EXPLORATORY
Anesthesia: General | Site: Neck

## 2019-12-06 MED ORDER — MIDAZOLAM HCL 2 MG/2ML IJ SOLN
INTRAMUSCULAR | Status: DC | PRN
  Administered 2019-12-06: 2 mg via INTRAVENOUS

## 2019-12-06 MED ORDER — ONDANSETRON HCL 4 MG/2ML IJ SOLN
INTRAMUSCULAR | Status: DC | PRN
  Administered 2019-12-06: 4 mg via INTRAVENOUS

## 2019-12-06 MED ORDER — FENTANYL CITRATE (PF) 100 MCG/2ML IJ SOLN
INTRAMUSCULAR | Status: AC
  Filled 2019-12-06: qty 2

## 2019-12-06 MED ORDER — SUGAMMADEX SODIUM 200 MG/2ML IV SOLN
INTRAVENOUS | Status: DC | PRN
  Administered 2019-12-06: 200 mg via INTRAVENOUS

## 2019-12-06 MED ORDER — SODIUM CHLORIDE 0.9% FLUSH
10.0000 mL | Freq: Two times a day (BID) | INTRAVENOUS | Status: DC
  Administered 2019-12-06 – 2019-12-16 (×17): 10 mL

## 2019-12-06 MED ORDER — FENTANYL CITRATE (PF) 100 MCG/2ML IJ SOLN
INTRAMUSCULAR | Status: DC | PRN
  Administered 2019-12-06: 50 ug via INTRAVENOUS
  Administered 2019-12-06: 100 ug via INTRAVENOUS
  Administered 2019-12-06 (×3): 50 ug via INTRAVENOUS

## 2019-12-06 MED ORDER — ROCURONIUM BROMIDE 10 MG/ML (PF) SYRINGE
PREFILLED_SYRINGE | INTRAVENOUS | Status: AC
  Filled 2019-12-06: qty 10

## 2019-12-06 MED ORDER — SODIUM CHLORIDE 0.9 % IR SOLN
Status: DC | PRN
  Administered 2019-12-06 (×5): 1000 mL

## 2019-12-06 MED ORDER — ROCURONIUM BROMIDE 10 MG/ML (PF) SYRINGE
PREFILLED_SYRINGE | INTRAVENOUS | Status: DC | PRN
  Administered 2019-12-06: 20 mg via INTRAVENOUS
  Administered 2019-12-06: 50 mg via INTRAVENOUS

## 2019-12-06 MED ORDER — MIDAZOLAM HCL 2 MG/2ML IJ SOLN
INTRAMUSCULAR | Status: AC
  Filled 2019-12-06: qty 2

## 2019-12-06 MED ORDER — SODIUM CHLORIDE 0.9% FLUSH: 10.0000 mL | INTRAVENOUS | Status: DC | PRN

## 2019-12-06 MED ORDER — ALBUMIN HUMAN 25 % IV SOLN
50.0000 g | Freq: Once | INTRAVENOUS | Status: AC
  Administered 2019-12-06: 50 g via INTRAVENOUS
  Filled 2019-12-06: qty 200

## 2019-12-06 MED ORDER — ONDANSETRON HCL 4 MG/2ML IJ SOLN
INTRAMUSCULAR | Status: AC
  Filled 2019-12-06: qty 2

## 2019-12-06 MED ORDER — LACTATED RINGERS IV SOLN: INTRAVENOUS | Status: DC | PRN

## 2019-12-06 SURGICAL SUPPLY — 80 items
APL PRP STRL LF DISP 70% ISPRP (MISCELLANEOUS)
APPLIER CLIP 11 MED OPEN (CLIP)
APPLIER CLIP 13 LRG OPEN (CLIP)
APR CLP LRG 13 20 CLIP (CLIP)
APR CLP MED 11 20 MLT OPN (CLIP)
BARRIER SKIN 2 3/4 (OSTOMY) ×3 IMPLANT
BARRIER SKIN OD2.25 2 3/4 FLNG (OSTOMY) IMPLANT
BLADE SURG 15 STRL LF DISP TIS (BLADE) IMPLANT
BLADE SURG 15 STRL SS (BLADE) ×3
BNDG CONFORM 2 STRL LF (GAUZE/BANDAGES/DRESSINGS) ×1 IMPLANT
BNDG GAUZE ELAST 4 BULKY (GAUZE/BANDAGES/DRESSINGS) ×2 IMPLANT
BRR SKN FLT 2.75X2.25 2 PC (OSTOMY) ×2
CANISTER WOUND CARE 500ML ATS (WOUND CARE) IMPLANT
CATH ROBINSON RED A/P 16FR (CATHETERS) ×2 IMPLANT
CELLS DAT CNTRL 66122 CELL SVR (MISCELLANEOUS) ×2 IMPLANT
CHLORAPREP W/TINT 26 (MISCELLANEOUS) ×2 IMPLANT
CLAMP POUCH DRAINAGE QUIET (OSTOMY) IMPLANT
CLIP APPLIE 11 MED OPEN (CLIP) IMPLANT
CLIP APPLIE 13 LRG OPEN (CLIP) IMPLANT
CLOTH BEACON ORANGE TIMEOUT ST (SAFETY) ×3 IMPLANT
COVER LIGHT HANDLE STERIS (MISCELLANEOUS) ×6 IMPLANT
COVER WAND RF STERILE (DRAPES) ×3 IMPLANT
DRAPE WARM FLUID 44X44 (DRAPES) ×3 IMPLANT
DRSG OPSITE POSTOP 4X10 (GAUZE/BANDAGES/DRESSINGS) ×3 IMPLANT
ELECT BLADE 6 FLAT ULTRCLN (ELECTRODE) IMPLANT
ELECT REM PT RETURN 9FT ADLT (ELECTROSURGICAL) ×3
ELECTRODE REM PT RTRN 9FT ADLT (ELECTROSURGICAL) ×2 IMPLANT
EVACUATOR DRAINAGE 10X20 100CC (DRAIN) IMPLANT
EVACUATOR SILICONE 100CC (DRAIN) ×6
GLOVE BIOGEL PI IND STRL 7.0 (GLOVE) ×4 IMPLANT
GLOVE BIOGEL PI INDICATOR 7.0 (GLOVE) ×2
GLOVE SURG SS PI 7.5 STRL IVOR (GLOVE) ×3 IMPLANT
GOWN STRL REUS W/TWL LRG LVL3 (GOWN DISPOSABLE) ×9 IMPLANT
HANDLE SUCTION POOLE (INSTRUMENTS) IMPLANT
INST SET MAJOR GENERAL (KITS) ×3 IMPLANT
KIT TURNOVER KIT A (KITS) ×3 IMPLANT
LIGASURE IMPACT 36 18CM CVD LR (INSTRUMENTS) ×3 IMPLANT
MANIFOLD NEPTUNE II (INSTRUMENTS) ×3 IMPLANT
NDL HYPO 18GX1.5 BLUNT FILL (NEEDLE) ×2 IMPLANT
NDL HYPO 21X1.5 SAFETY (NEEDLE) ×2 IMPLANT
NEEDLE HYPO 18GX1.5 BLUNT FILL (NEEDLE) IMPLANT
NEEDLE HYPO 21X1.5 SAFETY (NEEDLE) ×3 IMPLANT
NS IRRIG 1000ML POUR BTL (IV SOLUTION) ×9 IMPLANT
PACK ABDOMINAL MAJOR (CUSTOM PROCEDURE TRAY) ×1 IMPLANT
PAD ABD 5X9 TENDERSORB (GAUZE/BANDAGES/DRESSINGS) ×2 IMPLANT
PAD ARMBOARD 7.5X6 YLW CONV (MISCELLANEOUS) ×3 IMPLANT
PENCIL SMOKE EVACUATOR (MISCELLANEOUS) ×3 IMPLANT
POUCH OSTOMY 2 3/4  H 3804 (WOUND CARE) ×3
POUCH OSTOMY 2 3/4 H 3804 (WOUND CARE) ×2
POUCH OSTOMY 2 PC DRNBL 2.75 (WOUND CARE) IMPLANT
RELOAD LINEAR CUT PROX 55 BLUE (ENDOMECHANICALS) IMPLANT
RELOAD PROXIMATE 75MM BLUE (ENDOMECHANICALS) IMPLANT
RELOAD STAPLE 55 3.8 BLU REG (ENDOMECHANICALS) IMPLANT
RELOAD STAPLE 75 3.8 BLU REG (ENDOMECHANICALS) IMPLANT
RETRACTOR WND ALEXIS 18 MED (MISCELLANEOUS) ×2 IMPLANT
RETRACTOR WND ALEXIS 25 LRG (MISCELLANEOUS) IMPLANT
RTRCTR WOUND ALEXIS 18CM MED (MISCELLANEOUS) ×3
RTRCTR WOUND ALEXIS 25CM LRG (MISCELLANEOUS)
SET BASIN LINEN APH (SET/KITS/TRAYS/PACK) ×3 IMPLANT
SPONGE DRAIN TRACH 4X4 STRL 2S (GAUZE/BANDAGES/DRESSINGS) ×2 IMPLANT
SPONGE LAP 18X18 RF (DISPOSABLE) ×3 IMPLANT
STAPLER GUN LINEAR PROX 60 (STAPLE) IMPLANT
STAPLER PROXIMATE 55 BLUE (STAPLE) IMPLANT
STAPLER PROXIMATE 75MM BLUE (STAPLE) ×1 IMPLANT
STAPLER VISISTAT (STAPLE) ×3 IMPLANT
SUCTION POOLE HANDLE (INSTRUMENTS)
SUT CHROMIC 0 SH (SUTURE) IMPLANT
SUT CHROMIC 2 0 SH (SUTURE) IMPLANT
SUT CHROMIC 3 0 PS 2 (SUTURE) ×2 IMPLANT
SUT CHROMIC 3 0 SH 27 (SUTURE) IMPLANT
SUT ETHIBOND 5 LR DA (SUTURE) ×5 IMPLANT
SUT ETHILON 3 0 PS 1 (SUTURE) ×3 IMPLANT
SUT NOVA NAB GS-26 0 60 (SUTURE) ×4 IMPLANT
SUT PDS AB 0 CTX 60 (SUTURE) IMPLANT
SUT SILK 2 0 (SUTURE)
SUT SILK 2-0 18XBRD TIE 12 (SUTURE) IMPLANT
SUT SILK 3 0 SH CR/8 (SUTURE) ×2 IMPLANT
SYR 20ML LL LF (SYRINGE) ×5 IMPLANT
TAPE CLOTH SURG 6X10 WHT LF (GAUZE/BANDAGES/DRESSINGS) ×1 IMPLANT
TRAY FOLEY MTR SLVR 16FR STAT (SET/KITS/TRAYS/PACK) ×2 IMPLANT

## 2019-12-06 NOTE — Interval H&P Note (Signed)
History and Physical Interval Note:  12/06/2019 10:40 AM  [name]  has presented today for surgery, with the diagnosis of bowel perforation.  The various methods of treatment have been discussed with the patient and family. After consideration of risks, benefits and other options for treatment, the patient has consented to  Procedure(s): EXPLORATORY LAPAROTOMY (N/A) as a surgical intervention.  The patient's history has been reviewed, patient examined, no change in status, stable for surgery.  I have reviewed the patient's chart and labs.  Questions were answered to the patient's satisfaction.     Franky Macho

## 2019-12-06 NOTE — Op Note (Signed)
Patient:  [name]  DOB:  11-10-1960  MRN:  [id number]   Preop Diagnosis: Status post right hemicolectomy due to perforated bowel, need for second look operation  Postop Diagnosis: Same, perforated small bowel  Procedure: Exploratory laparotomy, partial small bowel resection with ileostomy, central line placement  Surgeon: Franky Macho, MD  Assistant: Algis Greenhouse, MD  Anes: General endotracheal  Indications: Patient is a 59 year old white female status post right hemicolectomy with wound VAC placement 2 days ago due to multiple interloop abscesses as a result of the perforation of the ascending colon.  At that time, she could not be reconnected and now we are going back for second look.  The risks and benefits of the procedure were fully explained to the patient's husband, who gave informed consent for the patient as the patient was intubated and sedated.  Procedure note: The patient was transferred from the intensive care unit intubated and placed on the operating room table.  The wound VAC was removed and Betadine was used to prep the abdomen.  Surgical site confirmation was performed.  There were areas within the abdominal cavity that had some cloudy fluid present.  Multiple liters of warm irrigation were then used.  The small bowel was then inspected from the ligament of Treitz to the stapled end.  There was an area of an elongated serosal tear with a punctate wound with enteric contents emanating from it.  After further inspection of multiple areas of friable small bowel, it was elected to proceed with an extension of the previous small bowel resection margin.  A GIA-75 stapler was placed across this segment of small bowel that was proximal to the original resection line and fired.  That distal segment of bowel was then removed using the LigaSure.  Greater than 150 cm of small bowel was remaining in the abdominal cavity.  The remaining colon was inspected noted with normal  limits.  We continued to irrigate out the abdominal cavity.  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  This was done using 3-0 Chromic Gut interrupted sutures.  For closure, we decided to use retention sutures.  #1 Ethibond retention sutures were placed full-thickness.  The fascia was reapproximated using an 0 Novafil interrupted suture.  The subcutaneous layer was irrigated with normal saline.  The skin was packed between the retention sutures with Betadine impregnated gauze.  A dry sterile dressing was then applied.  Next, a left subclavian triple-lumen central line was placed to the 20 cm Marlow Berenguer without difficulty.  It was secured at the skin level using a 3-0 silk suture.  Good backflow of venous blood was noted on aspiration of all 3 ports.  All 3 ports were flushed with saline.  All tape and needle counts were correct at the end of the procedure.  The patient was transferred back to the intensive care unit intubated in guarded but stable condition.  Complications: None  EBL: 50 cc  Specimen: Small bowel

## 2019-12-06 NOTE — Anesthesia Procedure Notes (Signed)
Date/Time: 12/06/2019 11:13 AM Performed by: Lucinda Dell, CRNA Pre-anesthesia Checklist: Patient identified, Emergency Drugs available, Suction available and Patient being monitored Patient Re-evaluated:Patient Re-evaluated prior to induction Oxygen Delivery Method: Circle system utilized Induction Type: Inhalational induction with existing ETT Placement Confirmation: positive ETCO2 and breath sounds checked- equal and bilateral Tube secured with: Tape

## 2019-12-06 NOTE — Transfer of Care (Signed)
Immediate Anesthesia Transfer of Care Note  Patient: [name]  Procedure(s) Performed: EXPLORATORY LAPAROTOMY (N/A Abdomen) CENTRAL LINE INSERTION (N/A Neck) PARTIAL SMALL BOWEL RESECTION WITH ILEOSTOMY (N/A Abdomen)  Patient Location: ICU  Anesthesia Type:General  Level of Consciousness: sedated, unresponsive and Patient remains intubated per anesthesia plan  Airway & Oxygen Therapy: Patient remains intubated per anesthesia plan and Patient placed on Ventilator (see vital sign flow sheet for setting)  Post-op Assessment: Report given to RN and Post -op Vital signs reviewed and stable  Post vital signs: Reviewed and stable  Last Vitals:  Vitals Value Taken Time  BP    Temp    Pulse    Resp    SpO2      Last Pain:  Vitals:   12/06/19 0809  TempSrc: Oral  PainSc:       Patients Stated Pain Goal: 5 (12/04/19 1650)  Complications: No complications documented.

## 2019-12-06 NOTE — Anesthesia Preprocedure Evaluation (Signed)
Anesthesia Evaluation  Patient identified by MRN, date of birth, ID bandGeneral Assessment Comment:S/p exp. Lap, on vent and sedated  Reviewed: Allergy & Precautions, NPO status , Patient's Chart, lab work & pertinent test results  History of Anesthesia Complications Negative for: history of anesthetic complications  Airway Mallampati: Intubated       Dental  (+) Dental Advisory Given, Teeth Intact   Pulmonary  On vent    Pulmonary exam normal breath sounds clear to auscultation       Cardiovascular Exercise Tolerance: Good hypertension, Pt. on medications  Rhythm:Regular Rate:Tachycardia     Neuro/Psych Anxiety negative neurological ROS  negative psych ROS   GI/Hepatic Neg liver ROS, GERD  Medicated,Bowel obstruction/perforation    Endo/Other  negative endocrine ROS  Renal/GU negative Renal ROS     Musculoskeletal  (+) Arthritis , Osteoarthritis,    Abdominal   Peds  Hematology negative hematology ROS (+)   Anesthesia Other Findings   Reproductive/Obstetrics                             Anesthesia Physical  Anesthesia Plan  ASA: IV  Anesthesia Plan: General   Post-op Pain Management:    Induction: Intravenous and Inhalational  PONV Risk Score and Plan: 4 or greater  Airway Management Planned: Oral ETT  Additional Equipment: Arterial line  Intra-op Plan:   Post-operative Plan: Post-operative intubation/ventilation  Informed Consent: I have reviewed the patients History and Physical, chart, labs and discussed the procedure including the risks, benefits and alternatives for the proposed anesthesia with the patient or authorized representative who has indicated his/her understanding and acceptance.     Dental advisory given and Consent reviewed with POA  Plan Discussed with: CRNA and Surgeon  Anesthesia Plan Comments: (S/p exp. Lap, on vent and sedated)         Anesthesia Quick Evaluation

## 2019-12-06 NOTE — Addendum Note (Signed)
Addendum  created 12/06/19 1539 by Moshe Salisbury, CRNA   Charge Capture section accepted

## 2019-12-06 NOTE — Anesthesia Postprocedure Evaluation (Signed)
Anesthesia Post Note  Patient: [name]  Procedure(s) Performed: EXPLORATORY LAPAROTOMY (N/A Abdomen) CENTRAL LINE INSERTION (N/A Neck) PARTIAL SMALL BOWEL RESECTION WITH ILEOSTOMY (N/A Abdomen)  Patient location during evaluation: ICU Anesthesia Type: General Level of consciousness: sedated and patient remains intubated per anesthesia plan Pain management: pain level controlled Vital Signs Assessment: post-procedure vital signs reviewed and stable Respiratory status: patient remains intubated per anesthesia plan Cardiovascular status: blood pressure returned to baseline and stable Postop Assessment: no apparent nausea or vomiting Anesthetic complications: no   No complications documented.   Last Vitals:  Vitals:   12/06/19 0809 12/06/19 1336  BP:    Pulse: 100   Resp: (!) 25   Temp: 37 C   SpO2: 100% 100%    Last Pain:  Vitals:   12/06/19 0809  TempSrc: Oral  PainSc:                  [name] C [name]

## 2019-12-07 ENCOUNTER — Encounter (HOSPITAL_COMMUNITY): Payer: Self-pay | Admitting: General Surgery

## 2019-12-07 LAB — CBC
HCT: 25.2 % — ABNORMAL LOW (ref 36.0–46.0)
Hemoglobin: 7.9 g/dL — ABNORMAL LOW (ref 12.0–15.0)
MCH: 28.6 pg (ref 26.0–34.0)
MCHC: 31.3 g/dL (ref 30.0–36.0)
MCV: 91.3 fL (ref 80.0–100.0)
Platelets: 239 10*3/uL (ref 150–400)
RBC: 2.76 MIL/uL — ABNORMAL LOW (ref 3.87–5.11)
RDW: 15.2 % (ref 11.5–15.5)
WBC: 8.5 10*3/uL (ref 4.0–10.5)
nRBC: 0 % (ref 0.0–0.2)

## 2019-12-07 LAB — TRIGLYCERIDES: Triglycerides: 211 mg/dL — ABNORMAL HIGH (ref <150)

## 2019-12-07 LAB — COMPREHENSIVE METABOLIC PANEL
ALT: 14 U/L (ref 0–44)
AST: 20 U/L (ref 15–41)
Albumin: 2.1 g/dL — ABNORMAL LOW (ref 3.5–5.0)
Alkaline Phosphatase: 40 U/L (ref 38–126)
Anion gap: 11 (ref 5–15)
BUN: 21 mg/dL — ABNORMAL HIGH (ref 6–20)
CO2: 18 mmol/L — ABNORMAL LOW (ref 22–32)
Calcium: 6.9 mg/dL — ABNORMAL LOW (ref 8.9–10.3)
Chloride: 112 mmol/L — ABNORMAL HIGH (ref 98–111)
Creatinine, Ser: 0.59 mg/dL (ref 0.44–1.00)
GFR, Estimated: >60 mL/min (ref >60)
Glucose, Bld: 121 mg/dL — ABNORMAL HIGH (ref 70–99)
Potassium: 3.5 mmol/L (ref 3.5–5.1)
Sodium: 141 mmol/L (ref 135–145)
Total Bilirubin: 0.7 mg/dL (ref 0.3–1.2)
Total Protein: 4.8 g/dL — ABNORMAL LOW (ref 6.5–8.1)

## 2019-12-07 LAB — GLUCOSE, CAPILLARY
Glucose-Capillary: 109 mg/dL — ABNORMAL HIGH (ref 70–99)
Glucose-Capillary: 113 mg/dL — ABNORMAL HIGH (ref 70–99)
Glucose-Capillary: 119 mg/dL — ABNORMAL HIGH (ref 70–99)
Glucose-Capillary: 130 mg/dL — ABNORMAL HIGH (ref 70–99)
Glucose-Capillary: 143 mg/dL — ABNORMAL HIGH (ref 70–99)
Glucose-Capillary: 92 mg/dL (ref 70–99)
Glucose-Capillary: 97 mg/dL (ref 70–99)

## 2019-12-07 LAB — MAGNESIUM: Magnesium: 1.7 mg/dL (ref 1.7–2.4)

## 2019-12-07 LAB — PREALBUMIN: Prealbumin: <5 mg/dL — ABNORMAL LOW (ref 18–38)

## 2019-12-07 LAB — PHOSPHORUS: Phosphorus: 2.7 mg/dL (ref 2.5–4.6)

## 2019-12-07 MED ORDER — SODIUM CHLORIDE 0.9 % IV SOLN: INTRAVENOUS | Status: AC

## 2019-12-07 MED ORDER — TRAVASOL 10 % IV SOLN
INTRAVENOUS | Status: AC
  Filled 2019-12-07: qty 475.2

## 2019-12-07 MED ORDER — INSULIN ASPART 100 UNIT/ML ~~LOC~~ SOLN
0.0000 [IU] | Freq: Three times a day (TID) | SUBCUTANEOUS | Status: DC
  Administered 2019-12-08 (×2): 1 [IU] via SUBCUTANEOUS
  Administered 2019-12-08: 2 [IU] via SUBCUTANEOUS
  Administered 2019-12-08: 1 [IU] via SUBCUTANEOUS

## 2019-12-07 NOTE — Progress Notes (Signed)
Initial Nutrition Assessment  DOCUMENTATION CODES:   Obesity unspecified  INTERVENTION:  TPN per pharmacy   NUTRITION DIAGNOSIS:   Inadequate oral intake related to inability to eat as evidenced by NPO status.   GOAL:   Provide needs based on ASPEN/SCCM guidelines   MONITOR:   Weight trends, Labs, I & O's, Diet advancement, Vent status, Skin  REASON FOR ASSESSMENT:   Consult, Ventilator New TPN/TNA  ASSESSMENT:  RD working remotely.  59 year old female with history of GERD and HTN presented with 2 weeks of diffuse abdominal pain, nausea, vomiting, and diarrhea. CT scan revealed peritonitis with free fluid and free air consistent with perforated bowel.  10/11- intubated  Pt is s/p ex-lap, right hemicolectomy with terminal ileum resection, abdominal washout on 10/11. Anastomosis could not be performed, pt remained intubated and returned to OR for second look yesterday.  Per chart, weights stable over the past 8 months. Weights up ~18 lbs since admit. Non-pitting BLE; RUE edema per flowsheets. Will use admit wt 80.5 kg for estimating needs.  I/Os: +9984.7 ml since admit NG output: 1000 ml x 24 hrs Drains: 170 ml x 24 hrs  Patient is currently intubated on ventilator support MV: 10.6 L/min Temp (24hrs), Avg:98.3 F (36.8 C), Min:97.7 F (36.5 C), Max:98.7 F (37.1 C)  Propofol: 17.95 ml/hr provides 474 kcal/day   Medications reviewed and include: Zosyn IVF: NaCl @ 150 ml/hr  Labs: CBGs 113,109,119,137, TGs 211 (H), Hgb 7.9 (L), HCT 25.2 (L)  NUTRITION - FOCUSED PHYSICAL EXAM: Unable to complete at this time, RD working remotely.  Diet Order:   Diet Order            Diet NPO time specified  Diet effective now                 EDUCATION NEEDS:   No education needs have been identified at this time  Skin:  Skin Assessment: Skin Integrity Issues: Skin Integrity Issues:: Incisions Incisions: closed; abdomen (10/13)  Last BM:  10/14 -125 ml  ostomy  Height:   Ht Readings from Last 1 Encounters:  12/07/19 5\' 1"  (1.549 m)    Weight:   Wt Readings from Last 1 Encounters:  12/07/19 88.8 kg   BMI:  Body mass index is 36.99 kg/m.  Estimated Nutritional Needs:   Kcal:  12/09/19  Protein:  95  Fluid:  >/= 1.2 L    [id number], RD, LDN Clinical Nutrition After Hours/Weekend Pager # in Amion

## 2019-12-07 NOTE — Progress Notes (Signed)
1 Day Post-Op  Subjective: Intubated on ventilator, sedated  Objective: Vital signs in last 24 hours: Temp:  [97.7 F (36.5 C)-98.7 F (37.1 C)] 98.5 F (36.9 C) (10/14 0819) Pulse Rate:  [87-117] 92 (10/14 0819) Resp:  [19-29] 27 (10/14 0819) BP: (106-132)/(46-60) 126/49 (10/13 2200) SpO2:  [98 %-100 %] 100 % (10/14 0819) Arterial Line BP: (109-144)/(39-137) 121/42 (10/14 0630) FiO2 (%):  [30 %] 30 % (10/14 0430)    Intake/Output from previous day: 10/13 0701 - 10/14 0700 In: 3753 [I.V.:3530; IV Piggyback:223] Out: 2195 [Urine:850; Emesis/NG output:1000; Drains:170; Stool:125; Blood:50] Intake/Output this shift: No intake/output data recorded.  General appearance: Sedated on ventilator Resp: clear to auscultation bilaterally Cardio: regular rate and rhythm, S1, S2 normal, no murmur, click, rub or gallop GI: Soft, dressing intact.  Ileostomy pink.  JP drainage serosanguineous in nature.  Lab Results:  Recent Labs    12/06/19 0723 12/07/19 0514  WBC 14.9* 8.5  HGB 8.9* 7.9*  HCT 27.8* 25.2*  PLT 319 239   BMET Recent Labs    12/06/19 0723 12/07/19 0514  NA 138 141  K 4.0 3.5  CL 111 112*  CO2 18* 18*  GLUCOSE 158* 121*  BUN 25* 21*  CREATININE 0.80 0.59  CALCIUM 6.8* 6.9*   PT/INR No results for input(s): LABPROT, INR in the last 72 hours.  Studies/Results: DG Chest Port 1 View  Result Date: 12/06/2019 CLINICAL DATA:  59 year old female with central line placement. EXAM: PORTABLE CHEST 1 VIEW COMPARISON:  Chest radiograph dated 12/04/2019. FINDINGS: Endotracheal tube remains above the carina and enteric tube with tip in the distal stomach. Interval placement of a left subclavian central venous line with tip at the cavoatrial junction. There is no focal consolidation, pleural effusion, or pneumothorax. The cardiac silhouette is within limits. No acute osseous pathology. IMPRESSION: Interval placement of a left subclavian central venous line with tip at the  cavoatrial junction. No pneumothorax. Electronically Signed   By: Elgie Collard M.D.   On: 12/06/2019 15:56    Anti-infectives: Anti-infectives (From admission, onward)   Start     Dose/Rate Route Frequency Ordered Stop   12/04/19 2030  piperacillin-tazobactam (ZOSYN) IVPB 3.375 g        3.375 g 12.5 mL/hr over 240 Minutes Intravenous Every 8 hours 12/04/19 2004     12/04/19 1500  ceFEPIme (MAXIPIME) 2 g in sodium chloride 0.9 % 100 mL IVPB        2 g 200 mL/hr over 30 Minutes Intravenous  Once 12/04/19 1455 12/04/19 1602   12/04/19 1500  metroNIDAZOLE (FLAGYL) IVPB 500 mg        500 mg 100 mL/hr over 60 Minutes Intravenous  Once 12/04/19 1455 12/04/19 1635      Assessment/Plan: s/p Procedure(s): EXPLORATORY LAPAROTOMY CENTRAL LINE INSERTION PARTIAL SMALL BOWEL RESECTION WITH ILEOSTOMY Impression: Postoperative day 1 and 3.  Patient remained stable overnight.  Initial intra-abdominal cultures showed multiple organisms including Klebsiella.  We will continue Zosyn for now.  Pathology showed no malignancy, but no appendix was visualized.  Patient's husband states she has never had an appendectomy.  I suspect patient had a perforated appendicitis and this caused her intra-abdominal abscesses.  Will start TPN today.  Have consulted pulmonology for ventilatory management.  May wean as able.  Husband updated.  LOS: 3 days    Franky Macho 12/07/2019

## 2019-12-07 NOTE — Progress Notes (Signed)
PHARMACY - TOTAL PARENTERAL NUTRITION CONSULT NOTE    Indication: Massive bowel obstruction   Patient Measurements: Height: 5\' 1"  (154.9 cm) Weight: 86.8 kg (191 lb 5.8 oz) IBW/kg (Calculated) : 47.8 TPN AdjBW (KG): 54.6 Body mass index is 36.16 kg/m.   Assessment: Status post right hemicolectomy due to perforated bowel, need for second look operation  Glucose / Insulin: stable- not on insulin Electrolytes: K 3.5   Ca 6.9 (corrected 8.4) Renal: Stable LFTs / TGs: WNL Prealbumin / albumin: albumin 2.1 Intake / Output; MIVF: I/Os: +9984.7 ml since admit NG output: 1000 ml x 24 hrs Drains: 170 ml x 24 hrs  NS @ 150 mL/hr   Surgeries / Procedures: Exploratory laparotomy, partial small bowel resection with ileostomy, central line placement  Central access: 12/06/19 left subclavian triple-lumen central line TPN start date: 12/07/19  Nutritional Goals (per RD recommendation on 10/14):  Kcal:  1208-1328  (424 kcal provided from propofol) Protein:  95g Fluid:  >/= 1.2 L   Goal TPN rate is 60 mL/hr (provides 95 g of protein and 918 kcals per day. Patient currently receiving 475 kcal/day of propofol for total 1393 kcal/day)  Current Nutrition:  NPO   Patient on propofol currently at 40 mcg/kg/min  (17.95 mL/hr) providing 474 kcal/day  Plan:  Start TPN at 30 mL/hr at 1800 Electrolytes in TPN: 55mEq/L of Na, 59mEq/L of K, 34mEq/L of Ca, 42mEq/L of Mg, and 44mmol/L of Phos. Cl:Ac ratio 1:2 Add standard MVI and trace elements to TPN Initiate Sensitive q8h SSI and adjust as needed  Reduce MIVF to 120 mL/hr at 1800 Monitor TPN labs on Mon/Thurs  12m 12/07/2019,8:49 AM

## 2019-12-08 DIAGNOSIS — K659 Peritonitis, unspecified: Secondary | ICD-10-CM

## 2019-12-08 DIAGNOSIS — K631 Perforation of intestine (nontraumatic): Secondary | ICD-10-CM

## 2019-12-08 DIAGNOSIS — Z9911 Dependence on respirator [ventilator] status: Secondary | ICD-10-CM

## 2019-12-08 LAB — CBC WITH DIFFERENTIAL/PLATELET
Band Neutrophils: 10 %
Basophils Absolute: 0 10*3/uL (ref 0.0–0.1)
Basophils Relative: 0 %
Eosinophils Absolute: 0 10*3/uL (ref 0.0–0.5)
Eosinophils Relative: 0 %
HCT: 21.8 % — ABNORMAL LOW (ref 36.0–46.0)
Hemoglobin: 6.9 g/dL — CL (ref 12.0–15.0)
Lymphocytes Relative: 12 %
Lymphs Abs: 1.1 10*3/uL (ref 0.7–4.0)
MCH: 29.1 pg (ref 26.0–34.0)
MCHC: 31.7 g/dL (ref 30.0–36.0)
MCV: 92 fL (ref 80.0–100.0)
Metamyelocytes Relative: 2 %
Monocytes Absolute: 0 10*3/uL — ABNORMAL LOW (ref 0.1–1.0)
Monocytes Relative: 0 %
Neutro Abs: 7.7 10*3/uL (ref 1.7–7.7)
Neutrophils Relative %: 76 %
Platelets: 289 10*3/uL (ref 150–400)
RBC: 2.37 MIL/uL — ABNORMAL LOW (ref 3.87–5.11)
RDW: 15.2 % (ref 11.5–15.5)
WBC: 9 10*3/uL (ref 4.0–10.5)
nRBC: 0 % (ref 0.0–0.2)

## 2019-12-08 LAB — GLUCOSE, CAPILLARY
Glucose-Capillary: 153 mg/dL — ABNORMAL HIGH (ref 70–99)
Glucose-Capillary: 150 mg/dL — ABNORMAL HIGH (ref 70–99)
Glucose-Capillary: 148 mg/dL — ABNORMAL HIGH (ref 70–99)
Glucose-Capillary: 130 mg/dL — ABNORMAL HIGH (ref 70–99)
Glucose-Capillary: 154 mg/dL — ABNORMAL HIGH (ref 70–99)
Glucose-Capillary: 165 mg/dL — ABNORMAL HIGH (ref 70–99)

## 2019-12-08 LAB — BPAM RBC
Blood Product Expiration Date: 202111102359
Blood Product Expiration Date: 202111102359
Unit Type and Rh: 9500
Unit Type and Rh: 9500

## 2019-12-08 LAB — PHOSPHORUS: Phosphorus: 2.5 mg/dL (ref 2.5–4.6)

## 2019-12-08 LAB — TYPE AND SCREEN
ABO/RH(D): O NEG
Antibody Screen: NEGATIVE
Unit division: 0
Unit division: 0

## 2019-12-08 LAB — SURGICAL PATHOLOGY

## 2019-12-08 LAB — HEMOGLOBIN AND HEMATOCRIT, BLOOD
HCT: 26.8 % — ABNORMAL LOW (ref 36.0–46.0)
Hemoglobin: 8.6 g/dL — ABNORMAL LOW (ref 12.0–15.0)

## 2019-12-08 LAB — AEROBIC/ANAEROBIC CULTURE W GRAM STAIN (SURGICAL/DEEP WOUND): Gram Stain: NONE SEEN

## 2019-12-08 LAB — COMPREHENSIVE METABOLIC PANEL
ALT: 12 U/L (ref 0–44)
AST: 21 U/L (ref 15–41)
Albumin: 1.8 g/dL — ABNORMAL LOW (ref 3.5–5.0)
Alkaline Phosphatase: 47 U/L (ref 38–126)
Anion gap: 6 (ref 5–15)
BUN: 17 mg/dL (ref 6–20)
CO2: 20 mmol/L — ABNORMAL LOW (ref 22–32)
Calcium: 6.9 mg/dL — ABNORMAL LOW (ref 8.9–10.3)
Chloride: 117 mmol/L — ABNORMAL HIGH (ref 98–111)
Creatinine, Ser: 0.4 mg/dL — ABNORMAL LOW (ref 0.44–1.00)
GFR, Estimated: >60 mL/min (ref >60)
Glucose, Bld: 157 mg/dL — ABNORMAL HIGH (ref 70–99)
Potassium: 3.5 mmol/L (ref 3.5–5.1)
Sodium: 143 mmol/L (ref 135–145)
Total Bilirubin: 0.7 mg/dL (ref 0.3–1.2)
Total Protein: 4.6 g/dL — ABNORMAL LOW (ref 6.5–8.1)

## 2019-12-08 LAB — MAGNESIUM: Magnesium: 1.9 mg/dL (ref 1.7–2.4)

## 2019-12-08 LAB — PREPARE RBC (CROSSMATCH)

## 2019-12-08 LAB — TRIGLYCERIDES: Triglycerides: 223 mg/dL — ABNORMAL HIGH (ref <150)

## 2019-12-08 MED ORDER — SODIUM CHLORIDE 0.9% IV SOLUTION: Freq: Once | INTRAVENOUS | Status: AC

## 2019-12-08 MED ORDER — SODIUM CHLORIDE 0.9 % IV SOLN: INTRAVENOUS | Status: DC

## 2019-12-08 MED ORDER — FENTANYL CITRATE (PF) 100 MCG/2ML IJ SOLN
50.0000 ug | INTRAMUSCULAR | Status: DC | PRN
  Administered 2019-12-08 – 2019-12-17 (×48): 50 ug via INTRAVENOUS
  Filled 2019-12-08 (×49): qty 2

## 2019-12-08 MED ORDER — TRAVASOL 10 % IV SOLN
INTRAVENOUS | Status: AC
  Filled 2019-12-08: qty 950.4

## 2019-12-08 NOTE — Consult Note (Signed)
NAME:  [name], MRN:  [id number], DOB:  1960/11/11, LOS: 4 ADMISSION DATE:  12/04/2019, CONSULTATION DATE:  12/08/2019 REFERRING MD:  Dr. Lovell Sheehan, Surgery, CHIEF COMPLAINT:  Abdominal pain  Brief History   59 yo female presented with vomiting, diarrhea and abdominal pain for 1 week prior to admission.  Found to have perforated bowel with free air an abscesses.  She had emergent laparotomy on 10/11.  Returned to OR on 10/13.  Remained on vent post-op and PCCM asked to assist with vent management.  Past Medical History  HTN, GERD, Anxiety, OA  Significant Hospital Events   10/11 Admit, exploratory laparotomy, Rt hemicolectomy with terminal ileum resection 10/13 exploratory laparotomy, partial small bowel resection with ileostomy 1014 start TPN 10/15 transfuse 1 unit PRBC, extubate  Consults:    Procedures:  ETT 10/11 >> Lt [place] CVL 10/13 >>  Radial a line 10/13 >>   Significant Diagnostic Tests:   CT abd/pelvis 10/11 >> bowel perforation with free air, bubbles in mesentery, probably SBO, interloop abscesses  Micro Data:  COVID 10/11 >> negative MRSA PCR 10/11 >> negative Blood 10/11 >>  Peritoneal abscess 10/11 >> Klebsiella oxytoca  Antimicrobials:  Cefepime 10/11 Flagyl 10/11 Zosyn 10/11 >>  Interim history/subjective:    Objective   Blood pressure (!) 126/39, pulse 84, temperature 98.6 F (37 C), resp. rate (!) 22, height 5\' 1"  (1.549 m), weight 88.8 kg, SpO2 99 %.    Vent Mode: PRVC FiO2 (%):  [30 %] 30 % Set Rate:  [18 bmp] 18 bmp Vt Set:  [380 mL] 380 mL PEEP:  [5 cmH20] 5 cmH20 Plateau Pressure:  [12 cmH20-19 cmH20] 17 cmH20   Intake/Output Summary (Last 24 hours) at 12/08/2019 12/10/2019 Last data filed at 12/08/2019 0600 Gross per 24 hour  Intake 3067.63 ml  Output 3285 ml  Net -217.37 ml   Filed Weights   12/05/19 0431 12/06/19 0443 12/07/19 0824  Weight: 80.5 kg 86.8 kg 88.8 kg    Examination:  General - sedated Eyes - pupils  reactive ENT - NG tube, ETT in place Cardiac - regular rate/rhythm, no murmur Chest - equal breath sounds b/l, no wheezing or rales Abdomen - ileostomy in place, wound dressing clean Extremities - 1+ edema Skin - no rashes Neuro - RASS 0, follows commands, moves all extremities   Resolved Hospital Problem list     Assessment & Plan:   Sepsis from peritonitis in setting of perforated abdominal viscus s/p laparotomy. - day 5 of Abx, currently on zosyn - TPN, post op care per surgery  Ventilator requirement after surgery. - extubation trial 10/15 - goal SpO2 > 92% - f/u CXR as needed - bronchial hygiene  Anemia of critical illness. - f/u CBC - transfuse for Hb < 7; transfuse 1 unit PRBC on 10/15  Hyperglycemia. - SSI  Best practice:  Diet: TPN DVT prophylaxis: Lovenox GI prophylaxis: Protonix Mobility: bed rest Code Status: full  Disposition: ICU  Labs   CBC: Recent Labs  Lab 12/04/19 1120 12/04/19 1120 12/05/19 0020 12/05/19 0414 12/06/19 0723 12/07/19 0514 12/08/19 0647  WBC 13.2*  --   --  17.0* 14.9* 8.5 9.0  NEUTROABS 10.8*  --   --   --   --   --  7.7  HGB 12.6   < > 10.2* 11.2* 8.9* 7.9* 6.9*  HCT 38.2   < > 31.3* 34.1* 27.8* 25.2* 21.8*  MCV 87.4  --   --  88.1 89.7 91.3 92.0  PLT 437*  --   --  424* 319 239 289   < > = values in this interval not displayed.    Basic Metabolic Panel: Recent Labs  Lab 12/04/19 1055 12/05/19 0414 12/06/19 0723 12/07/19 0514 12/08/19 0359  NA 132* 135 138 141 143  K 2.5* 4.7 4.0 3.5 3.5  CL 93* 106 111 112* 117*  CO2 26 15* 18* 18* 20*  GLUCOSE 130* 128* 158* 121* 157*  BUN 24* 18 25* 21* 17  CREATININE 0.91 0.61 0.80 0.59 0.40*  CALCIUM 8.2* 7.1* 6.8* 6.9* 6.9*  MG  --  1.7 1.7 1.7 1.9  PHOS  --  3.7 3.8 2.7 2.5   GFR: Estimated Creatinine Clearance: 76.7 mL/min (A) (by C-G formula based on SCr of 0.4 mg/dL (L)). Recent Labs  Lab 12/05/19 0414 12/06/19 0723 12/07/19 0514 12/08/19 0647  WBC  17.0* 14.9* 8.5 9.0    Liver Function Tests: Recent Labs  Lab 12/04/19 1055 12/05/19 0414 12/06/19 0723 12/07/19 0514 12/08/19 0359  AST 24 31 27 20 21   ALT 24 21 19 14 12   ALKPHOS 77 64 56 40 47  BILITOT 1.6* 1.9* 0.9 0.7 0.7  PROT 7.3 4.7* 3.9* 4.8* 4.6*  ALBUMIN 2.4* 1.6* 1.2* 2.1* 1.8*   Recent Labs  Lab 12/04/19 1055  LIPASE 26   No results for input(s): AMMONIA in the last 168 hours.  ABG    Component Value Date/Time   PHART 7.395 12/05/2019 0908   PCO2ART 23.9 (L) 12/05/2019 0908   PO2ART 156 (H) 12/05/2019 0908   HCO3 17.3 (L) 12/05/2019 0908   ACIDBASEDEF 9.6 (H) 12/05/2019 0908   O2SAT 97.6 12/05/2019 0908     Coagulation Profile: No results for input(s): INR, PROTIME in the last 168 hours.  Cardiac Enzymes: No results for input(s): CKTOTAL, CKMB, CKMBINDEX, TROPONINI in the last 168 hours.  HbA1C: No results found for: HGBA1C  CBG: Recent Labs  Lab 12/07/19 1653 12/07/19 2029 12/07/19 2354 12/08/19 0456 12/08/19 0757  GLUCAP 92 130* 143* 153* 150*    Review of Systems:   Unable to obtain.  Past Medical History  She,  has a past medical history of Anxiety, Arthritis, GERD (gastroesophageal reflux disease), Hypertension, and S/P endoscopy.   Surgical History    Past Surgical History:  Procedure Laterality Date  . BOWEL RESECTION N/A 12/06/2019   Procedure: PARTIAL SMALL BOWEL RESECTION WITH ILEOSTOMY;  Surgeon: 12/10/19, MD;  Location: [facility];  Service: General;  Laterality: N/A;  . CENTRAL LINE INSERTION N/A 12/06/2019   Procedure: CENTRAL LINE INSERTION;  Surgeon: Franky Macho, MD;  Location: [facility];  Service: General;  Laterality: N/A;  . CESAREAN SECTION    . CHOLECYSTECTOMY    . COLONOSCOPY  10/17/2010   normal   . ESOPHAGOGASTRODUODENOSCOPY  10/17/2010   non-critical Schatzki's ring s/p dilation, patulous EG junction, small hiatal hernia, otherwise normal stomach, first and second portion of duodenum  .  ESOPHAGOGASTRODUODENOSCOPY N/A 10/26/2018   Procedure: ESOPHAGOGASTRODUODENOSCOPY (EGD);  Surgeon: 10/19/2010, MD;  Location: [facility];  Service: Endoscopy;  Laterality: N/A;  9:15am  . FOOT SURGERY Right    heel spur  . HAND SURGERY Left    carpal tunnel  . HERNIA REPAIR N/A    approx.    10 or 12 years ago  . KNEE ARTHROSCOPY WITH MEDIAL MENISECTOMY  01/07/2012   Procedure: KNEE ARTHROSCOPY WITH MEDIAL MENISECTOMY;  Surgeon: Corbin Ade, MD;  Location: [facility];  Service: Orthopedics;  Laterality: Right;  . LAPAROTOMY N/A 12/04/2019   Procedure: EXPLORATORY LAPAROTOMY;  Surgeon: Franky Macho, MD;  Location: [facility];  Service: General;  Laterality: N/A;  . LAPAROTOMY N/A 12/06/2019   Procedure: EXPLORATORY LAPAROTOMY;  Surgeon: Franky Macho, MD;  Location: [facility];  Service: General;  Laterality: N/A;  [name] DILATION  10/17/2010   Procedure: Alvy Beal;  Surgeon: Corbin Ade, MD;  [facility]: [facility];  Service: Endoscopy;  Laterality: N/A;  . [name] DILATION N/A 10/26/2018   Procedure: [name] DILATION;  Surgeon: Corbin Ade, MD;  Location: [facility];  Service: Endoscopy;  Laterality: N/A;  . PARTIAL COLECTOMY Right 12/04/2019   Procedure: RIGHT HEMI COLECTOMY WITH TERMINAL ILEUM RESECTION;  Surgeon: Franky Macho, MD;  Location: [facility];  Service: General;  Laterality: Right;  . TOTAL KNEE ARTHROPLASTY Right 04/18/2019   Procedure: RIGHT TOTAL KNEE ARTHROPLASTY;  Surgeon: Vickki Hearing, MD;  Location: [facility];  Service: Orthopedics;  Laterality: Right;  . TRIGGER FINGER RELEASE Right 08/20/2016   Procedure: RELEASE A-1 PULLEY RIGHT LONG FINGER;  Surgeon: Vickki Hearing, MD;  Location: [facility];  Service: Orthopedics;  Laterality: Right;     Social History   reports that she has never smoked. She has never used smokeless tobacco. She reports current alcohol use. She reports that she does not use drugs.   Family History   Her family history includes  Cancer in her father; Colon cancer in her paternal uncle. There is no history of Colon polyps.   Allergies Allergies  Allergen Reactions  . 5-Alpha Reductase Inhibitors   . Maxipime [Cefepime]     Hives    . Metronidazole     hives  . Acetaminophen-Codeine Rash    Tylenol with Codeine Tylenol #3,     Home Medications  Prior to Admission medications   Not on File     Critical care time: 36 minutes  Coralyn Helling, MD [facility] Pulmonary/Critical Care Pager - [phone] 12/08/2019, 8:57 AM

## 2019-12-08 NOTE — Progress Notes (Signed)
Consent for blood obtained from husband at 0920 on 10/15. Patient to receive one unit of PRBCs for Hgb 6.9.

## 2019-12-08 NOTE — Progress Notes (Signed)
2 Days Post-Op  Subjective: Patient was intubated when I saw her earlier today, but she is now extubated.  Objective: Vital signs in last 24 hours: Temp:  [97.7 F (36.5 C)-98.8 F (37.1 C)] 98.6 F (37 C) (10/15 0700) Pulse Rate:  [84-100] 90 (10/15 1029) Resp:  [20-25] 23 (10/15 1029) BP: (126-141)/(33-49) 141/49 (10/15 1000) SpO2:  [98 %-100 %] 100 % (10/15 1029) Arterial Line BP: (86-142)/(40-56) 129/50 (10/15 0900) FiO2 (%):  [30 %-35 %] 35 % (10/15 0853)    Intake/Output from previous day: 10/14 0701 - 10/15 0700 In: 3932.9 [I.V.:3798; IV Piggyback:135] Out: 3285 [Urine:1000; Emesis/NG output:2000; Drains:35; Stool:250] Intake/Output this shift: Total I/O In: 659.3 [I.V.:613.7; IV Piggyback:45.6] Out: -   General appearance: no distress Resp: clear to auscultation bilaterally Cardio: regular rate and rhythm, S1, S2 normal, no murmur, click, rub or gallop GI: Soft, ileostomy pink and patent.  Midline incision packing removed and replaced with normal saline cleaning.  No purulent drainage present.  JP drainage serosanguineous in nature.  Lab Results:  Recent Labs    12/07/19 0514 12/08/19 0647  WBC 8.5 9.0  HGB 7.9* 6.9*  HCT 25.2* 21.8*  PLT 239 289   BMET Recent Labs    12/07/19 0514 12/08/19 0359  NA 141 143  K 3.5 3.5  CL 112* 117*  CO2 18* 20*  GLUCOSE 121* 157*  BUN 21* 17  CREATININE 0.59 0.40*  CALCIUM 6.9* 6.9*   PT/INR No results for input(s): LABPROT, INR in the last 72 hours.  Studies/Results: DG Chest Port 1 View  Result Date: 12/06/2019 CLINICAL DATA:  59 year old female with central line placement. EXAM: PORTABLE CHEST 1 VIEW COMPARISON:  Chest radiograph dated 12/04/2019. FINDINGS: Endotracheal tube remains above the carina and enteric tube with tip in the distal stomach. Interval placement of a left subclavian central venous line with tip at the cavoatrial junction. There is no focal consolidation, pleural effusion, or pneumothorax.  The cardiac silhouette is within limits. No acute osseous pathology. IMPRESSION: Interval placement of a left subclavian central venous line with tip at the cavoatrial junction. No pneumothorax. Electronically Signed   By: Elgie Collard M.D.   On: 12/06/2019 15:56    Anti-infectives: Anti-infectives (From admission, onward)   Start     Dose/Rate Route Frequency Ordered Stop   12/04/19 2030  piperacillin-tazobactam (ZOSYN) IVPB 3.375 g        3.375 g 12.5 mL/hr over 240 Minutes Intravenous Every 8 hours 12/04/19 2004     12/04/19 1500  ceFEPIme (MAXIPIME) 2 g in sodium chloride 0.9 % 100 mL IVPB        2 g 200 mL/hr over 30 Minutes Intravenous  Once 12/04/19 1455 12/04/19 1602   12/04/19 1500  metroNIDAZOLE (FLAGYL) IVPB 500 mg        500 mg 100 mL/hr over 60 Minutes Intravenous  Once 12/04/19 1455 12/04/19 1635      Assessment/Plan: s/p Procedure(s): EXPLORATORY LAPAROTOMY CENTRAL LINE INSERTION PARTIAL SMALL BOWEL RESECTION WITH ILEOSTOMY Impression: Postoperative days 2 and 4.  Patient progressing well.  She has been extubated.  She is already been started on TPN and she has had some ileostomy output.  She still has high NG tube output, not unexpected at this point.  Hemoglobin was low but she is being transfused 1 unit of packed red blood cells.  She did grow out Klebsiella but it is sensitive to Zosyn.  No leukocytosis present. Plan: Continue current management.  Dr. Henreitta Leber will be  following her this weekend.  LOS: 4 days    Franky Macho 12/08/2019

## 2019-12-08 NOTE — Progress Notes (Signed)
PHARMACY - TOTAL PARENTERAL NUTRITION CONSULT NOTE    Indication: Massive bowel obstruction   Patient Measurements: Height: 5\' 1"  (154.9 cm) Weight: 88.8 kg (195 lb 12.3 oz) IBW/kg (Calculated) : 47.8 TPN AdjBW (KG): 54.6 Body mass index is 36.99 kg/m.   Assessment: Status post right hemicolectomy due to perforated bowel, need for second look operation  Glucose / Insulin: 130-157. 1 unit given Electrolytes: K 3.5   Ca 6.9 (corrected 8.7) Phos 2.5 Renal: Stable LFTs / TGs: WNL TG 223 Prealbumin / albumin: albumin 1.8 Intake / Output; MIVF: I/Os: +9984.7 ml since admit NG output: 1000 ml x 24 hrs Drains: 170 ml x 24 hrs  NS @ 120 mL/hr   Surgeries / Procedures: Exploratory laparotomy, partial small bowel resection with ileostomy, central line placement  Central access: 12/06/19 left subclavian triple-lumen central line TPN start date: 12/07/19  Nutritional Goals (per RD recommendation on 10/14):  Kcal:  11/14  kcal Protein:  95g Fluid:  >/= 1.2 L   Goal TPN rate is 60 mL/hr (provides 95 g of protein and 1304 kcals per day.   Current Nutrition:  NPO   10/15: Patient extubated and taken off propofol    Plan:  Increase TPN to 60 mL/hr at 1800 Add lipids to TPN since propofol discontinued  Electrolytes in TPN: 43mEq/L of Na, 43mEq/L of K, 20mEq/L of Ca, 49mEq/L of Mg, and 40mmol/L of Phos. Cl:Ac ratio 1:2 Add standard MVI and trace elements to TPN Continue Sensitive q8h SSI and adjust as needed  Reduce MIVF to 90 mL/hr at 1800 Monitor TPN labs on Mon/Thurs  Roe Wilner C Azure Barrales 12/08/2019,8:51 AM

## 2019-12-08 NOTE — Progress Notes (Signed)
AT 1020 HR RT extubated patient to 4L O2 via nasal cannula. Patient tolerated well, RN at bedside. No complications noted. Patient maintaining SATs 100%, HR 92 and RR 29. RT will continue to monitor and assess.

## 2019-12-09 LAB — CBC
HCT: 32.9 % — ABNORMAL LOW (ref 36.0–46.0)
Hemoglobin: 9.9 g/dL — ABNORMAL LOW (ref 12.0–15.0)
MCH: 28.4 pg (ref 26.0–34.0)
MCHC: 30.1 g/dL (ref 30.0–36.0)
MCV: 94.3 fL (ref 80.0–100.0)
Platelets: 295 10*3/uL (ref 150–400)
RBC: 3.49 MIL/uL — ABNORMAL LOW (ref 3.87–5.11)
RDW: 15.5 % (ref 11.5–15.5)
WBC: 8.9 10*3/uL (ref 4.0–10.5)
nRBC: 0 % (ref 0.0–0.2)

## 2019-12-09 LAB — GLUCOSE, CAPILLARY
Glucose-Capillary: 157 mg/dL — ABNORMAL HIGH (ref 70–99)
Glucose-Capillary: 178 mg/dL — ABNORMAL HIGH (ref 70–99)
Glucose-Capillary: 166 mg/dL — ABNORMAL HIGH (ref 70–99)
Glucose-Capillary: 150 mg/dL — ABNORMAL HIGH (ref 70–99)

## 2019-12-09 LAB — TYPE AND SCREEN
ABO/RH(D): O NEG
Antibody Screen: NEGATIVE
Unit division: 0

## 2019-12-09 LAB — CULTURE, BLOOD (SINGLE)
Culture: NO GROWTH
Special Requests: ADEQUATE

## 2019-12-09 LAB — BASIC METABOLIC PANEL
Anion gap: 8 (ref 5–15)
BUN: 14 mg/dL (ref 6–20)
CO2: 23 mmol/L (ref 22–32)
Calcium: 7.1 mg/dL — ABNORMAL LOW (ref 8.9–10.3)
Chloride: 115 mmol/L — ABNORMAL HIGH (ref 98–111)
Creatinine, Ser: 0.44 mg/dL (ref 0.44–1.00)
GFR, Estimated: >60 mL/min (ref >60)
Glucose, Bld: 175 mg/dL — ABNORMAL HIGH (ref 70–99)
Potassium: 3.1 mmol/L — ABNORMAL LOW (ref 3.5–5.1)
Sodium: 146 mmol/L — ABNORMAL HIGH (ref 135–145)

## 2019-12-09 LAB — BPAM RBC
Blood Product Expiration Date: 202111102359
ISSUE DATE / TIME: 202110151152
Unit Type and Rh: 9500

## 2019-12-09 LAB — TRIGLYCERIDES: Triglycerides: 196 mg/dL — ABNORMAL HIGH (ref <150)

## 2019-12-09 MED ORDER — POTASSIUM CHLORIDE 10 MEQ/100ML IV SOLN
10.0000 meq | INTRAVENOUS | Status: AC
  Administered 2019-12-09 (×4): 10 meq via INTRAVENOUS
  Filled 2019-12-09 (×4): qty 100

## 2019-12-09 MED ORDER — TRAVASOL 10 % IV SOLN
INTRAVENOUS | Status: AC
  Filled 2019-12-09: qty 936

## 2019-12-09 MED ORDER — INSULIN ASPART 100 UNIT/ML ~~LOC~~ SOLN
0.0000 [IU] | Freq: Four times a day (QID) | SUBCUTANEOUS | Status: DC
  Administered 2019-12-09: 2 [IU] via SUBCUTANEOUS
  Administered 2019-12-09: 3 [IU] via SUBCUTANEOUS
  Administered 2019-12-10 – 2019-12-16 (×12): 2 [IU] via SUBCUTANEOUS

## 2019-12-09 NOTE — Progress Notes (Signed)
PHARMACY - TOTAL PARENTERAL NUTRITION CONSULT NOTE    Indication: Massive bowel obstruction   Patient Measurements: Height: 5\' 1"  (154.9 cm) Weight: 88.8 kg (195 lb 12.3 oz) IBW/kg (Calculated) : 47.8 TPN AdjBW (KG): 54.6 Body mass index is 36.99 kg/m.   Assessment: Status post right hemicolectomy due to perforated bowel, need for second look operation  Glucose / Insulin: 154-178. 4 units given in past 24 hours. Electrolytes: K 3.1   Ca 7.1 (corrected 8.9) Phos 2.5 Renal: Stable LFTs / TGs: WNL TG 196 Prealbumin / albumin: albumin 1.8 Intake / Output; MIVF: I/Os: +9984.7 ml since admit NG output: 1000 ml x 24 hrs Drains: 170 ml x 24 hrs  NS @ 90 mL/hr   Surgeries / Procedures: Exploratory laparotomy, partial small bowel resection with ileostomy, central line placement  Central access: 12/06/19 left subclavian triple-lumen central line TPN start date: 12/07/19  Nutritional Goals (per RD recommendation on 10/14):  Kcal:  11/14  kcal Protein:  95g Fluid:  >/= 1.2 L   Goal TPN rate is 60 mL/hr (provides 95 g of protein and 1304 kcals per day.   Current Nutrition:  NPO   10/15: Patient extubated and taken off propofol    Plan:  Potassium Chloride 40 mEq IV total  Continue TPN to 60 mL/hr at 1800 Continue lipids to TPN since propofol discontinued  Electrolytes in TPN: 33mEq/L of Na, 45mEq/L of K, 65mEq/L of Ca, 72mEq/L of Mg, and 24mmol/L of Phos. Cl:Ac ratio 1:2 Add standard MVI and trace elements to TPN Change to moderate every 8 hours SSI and adjust as needed  Continue MIVF at 90 mL/hr at 1800 Monitor TPN labs on Mon/Thurs  Idamae Coccia C Gianni Fuchs 12/09/2019,8:48 AM

## 2019-12-09 NOTE — Progress Notes (Addendum)
[facility] Progress Note  3 Days Post-Op  Subjective:  Extubated yesterday and doing fair. Awake but minimal on IS and weak cough. Midline dressing on the superficial surface and not deep. JP on the right with tan material, JP on left with SS fluid. Ostomy with green fluid in bag, NG with green dark output.   Objective: Vital signs in last 24 hours: Temp:  [99.1 F (37.3 C)-99.7 F (37.6 C)] 99.1 F (37.3 C) (10/16 0400) Pulse Rate:  [81-102] 98 (10/16 1000) Resp:  [15-28] 25 (10/16 1000) BP: (135-159)/(42-70) 150/58 (10/16 1000) SpO2:  [98 %-100 %] 100 % (10/16 1000)    Intake/Output from previous day: 10/15 0701 - 10/16 0700 In: 3710.1 [I.V.:3256.8; Blood:315; IV Piggyback:138.3] Out: 2415 [Urine:1400; Emesis/NG output:550; Drains:40; Stool:425] Intake/Output this shift: Total I/O In: 1052.3 [I.V.:997.1; IV Piggyback:55.2] Out: 695 [Urine:45; Stool:650]  General appearance: alert, cooperative and mild distress Resp: normal work of breathing, poor IS < 250, weak cough GI: soft, nondistended, tender, midline with retention sutures, packing changed and placed down to the fascia, JP drains stripped, left SS and right tan fluid, ABd pads and drain dressing placed  Lab Results:  Recent Labs    12/08/19 0647 12/08/19 0647 12/08/19 1538 12/09/19 0542  WBC 9.0  --   --  8.9  HGB 6.9*   < > 8.6* 9.9*  HCT 21.8*   < > 26.8* 32.9*  PLT 289  --   --  295   < > = values in this interval not displayed.   BMET Recent Labs    12/08/19 0359 12/09/19 0542  NA 143 146*  K 3.5 3.1*  CL 117* 115*  CO2 20* 23  GLUCOSE 157* 175*  BUN 17 14  CREATININE 0.40* 0.44  CALCIUM 6.9* 7.1*    Anti-infectives: Anti-infectives (From admission, onward)   Start     Dose/Rate Route Frequency Ordered Stop   12/04/19 2030  piperacillin-tazobactam (ZOSYN) IVPB 3.375 g        3.375 g 12.5 mL/hr over 240 Minutes Intravenous Every 8 hours 12/04/19 2004     12/04/19 1500   ceFEPIme (MAXIPIME) 2 g in sodium chloride 0.9 % 100 mL IVPB        2 g 200 mL/hr over 30 Minutes Intravenous  Once 12/04/19 1455 12/04/19 1602   12/04/19 1500  metroNIDAZOLE (FLAGYL) IVPB 500 mg        500 mg 100 mL/hr over 60 Minutes Intravenous  Once 12/04/19 1455 12/04/19 1635      Assessment/Plan: [name] is a 59 yo with complicated course s/p Ex lap SBR 10/11 left in discontinuity and Re exploration SBR and ileostomy 10/13. Doing fair extubated.  -Fentanyl PRN -IS< OOB / to chair position needs to work on pulmonary toilet  -HD ok, HR up a little this AM, monitor  -TPN, K replaced by pharmacy in and increased -NPO, NG, ileostomy having some output, SSI  -No leukocytosis, on Post op zosyn antibiotics for intraperitoneal contamination -H&H up after transfusion -UOP adequate, foley in place, will see if we can change to pure wick tomorrow -SCDs, lovenox -PT ordered to get moving -Dressing needs packed down to the fascia  -Will keep in ICU today  Husband updated at bedside.  CVL L subclavian 10/13    LOS: 5 days    [name] 12/09/2019

## 2019-12-10 LAB — BASIC METABOLIC PANEL
Anion gap: 7 (ref 5–15)
BUN: 15 mg/dL (ref 6–20)
CO2: 24 mmol/L (ref 22–32)
Calcium: 7 mg/dL — ABNORMAL LOW (ref 8.9–10.3)
Chloride: 112 mmol/L — ABNORMAL HIGH (ref 98–111)
Creatinine, Ser: 0.39 mg/dL — ABNORMAL LOW (ref 0.44–1.00)
GFR, Estimated: >60 mL/min (ref >60)
Glucose, Bld: 159 mg/dL — ABNORMAL HIGH (ref 70–99)
Potassium: 3.3 mmol/L — ABNORMAL LOW (ref 3.5–5.1)
Sodium: 143 mmol/L (ref 135–145)

## 2019-12-10 LAB — TRIGLYCERIDES: Triglycerides: 174 mg/dL — ABNORMAL HIGH (ref <150)

## 2019-12-10 LAB — GLUCOSE, CAPILLARY
Glucose-Capillary: 143 mg/dL — ABNORMAL HIGH (ref 70–99)
Glucose-Capillary: 145 mg/dL — ABNORMAL HIGH (ref 70–99)
Glucose-Capillary: 144 mg/dL — ABNORMAL HIGH (ref 70–99)
Glucose-Capillary: 123 mg/dL — ABNORMAL HIGH (ref 70–99)
Glucose-Capillary: 139 mg/dL — ABNORMAL HIGH (ref 70–99)

## 2019-12-10 LAB — PHOSPHORUS: Phosphorus: 2.2 mg/dL — ABNORMAL LOW (ref 2.5–4.6)

## 2019-12-10 LAB — MAGNESIUM: Magnesium: 1.5 mg/dL — ABNORMAL LOW (ref 1.7–2.4)

## 2019-12-10 MED ORDER — TRAVASOL 10 % IV SOLN
INTRAVENOUS | Status: AC
  Filled 2019-12-10: qty 936

## 2019-12-10 MED ORDER — POTASSIUM PHOSPHATES 15 MMOLE/5ML IV SOLN
15.0000 mmol | Freq: Once | INTRAVENOUS | Status: AC
  Administered 2019-12-10: 15 mmol via INTRAVENOUS
  Filled 2019-12-10: qty 5

## 2019-12-10 MED ORDER — ALUM & MAG HYDROXIDE-SIMETH 200-200-20 MG/5ML PO SUSP
30.0000 mL | Freq: Once | ORAL | Status: DC
  Filled 2019-12-10: qty 30

## 2019-12-10 MED ORDER — ALUM & MAG HYDROXIDE-SIMETH 200-200-20 MG/5ML PO SUSP
30.0000 mL | Freq: Once | ORAL | Status: AC
  Administered 2019-12-10: 30 mL

## 2019-12-10 MED ORDER — MAGNESIUM SULFATE 2 GM/50ML IV SOLN
2.0000 g | Freq: Once | INTRAVENOUS | Status: AC
  Administered 2019-12-10: 2 g via INTRAVENOUS
  Filled 2019-12-10: qty 50

## 2019-12-10 MED ORDER — POTASSIUM CHLORIDE 10 MEQ/100ML IV SOLN
10.0000 meq | INTRAVENOUS | Status: AC
  Administered 2019-12-10 (×2): 10 meq via INTRAVENOUS
  Filled 2019-12-10 (×2): qty 100

## 2019-12-10 MED ORDER — POTASSIUM CHLORIDE 10 MEQ/100ML IV SOLN: 10.0000 meq | INTRAVENOUS | Status: DC

## 2019-12-10 NOTE — Progress Notes (Signed)
[facility] Progress Note  4 Days Post-Op  Subjective: Await and up in bed today. Talking. Ng output remains high. Ostomy working. IS up to 500 today. Encouraged.   Objective: Vital signs in last 24 hours: Temp:  [98.2 F (36.8 C)-100 F (37.8 C)] 99.3 F (37.4 C) (10/17 0800) Pulse Rate:  [100-115] 106 (10/17 0800) Resp:  [21-29] 21 (10/17 0800) BP: (133-159)/(52-82) 145/64 (10/17 0800) SpO2:  [94 %-100 %] 96 % (10/17 0813)    Intake/Output from previous day: 10/16 0701 - 10/17 0700 In: 3819.6 [I.V.:3257.4; IV Piggyback:562.2] Out: 3245 [Urine:1105; Emesis/NG output:900; Drains:90; Stool:1150] Intake/Output this shift: Total I/O In: 867.4 [I.V.:821; IV Piggyback:46.5] Out: 310 [Stool:310]  General appearance: alert, cooperative and mild distress Resp: low lung volumes, IS 500 GI: soft, tender, midline with dressing, right JP with tan fluid, left with serous fluid  Lab Results:  Recent Labs    12/08/19 0647 12/08/19 0647 12/08/19 1538 12/09/19 0542  WBC 9.0  --   --  8.9  HGB 6.9*   < > 8.6* 9.9*  HCT 21.8*   < > 26.8* 32.9*  PLT 289  --   --  295   < > = values in this interval not displayed.   BMET Recent Labs    12/09/19 0542 12/10/19 0522  NA 146* 143  K 3.1* 3.3*  CL 115* 112*  CO2 23 24  GLUCOSE 175* 159*  BUN 14 15  CREATININE 0.44 0.39*  CALCIUM 7.1* 7.0*    Anti-infectives: Anti-infectives (From admission, onward)   Start     Dose/Rate Route Frequency Ordered Stop   12/04/19 2030  piperacillin-tazobactam (ZOSYN) IVPB 3.375 g        3.375 g 12.5 mL/hr over 240 Minutes Intravenous Every 8 hours 12/04/19 2004     12/04/19 1500  ceFEPIme (MAXIPIME) 2 g in sodium chloride 0.9 % 100 mL IVPB        2 g 200 mL/hr over 30 Minutes Intravenous  Once 12/04/19 1455 12/04/19 1602   12/04/19 1500  metroNIDAZOLE (FLAGYL) IVPB 500 mg        500 mg 100 mL/hr over 60 Minutes Intravenous  Once 12/04/19 1455 12/04/19 1635       Assessment/Plan: [name] is a 59 yo with complicated course s/p Ex lap SBR 10/11 left in discontinuity and Re exploration SBR and ileostomy 10/13. Doing better with IS. Encouraged her today to continue every hour.  -Fentanyl PRN -IS, to chair position, work on pulmonary toilet  -HD ok -TPN, K replaced by pharmacy -NPO, NG, ileostomy having some output, SSI  -Post op zosyn antibiotics for intraperitoneal contamination -CBC tomorrow  -UOP adequate, foley out and pure wick today -SCDs, lovenox -PT ordered to get moving -Dressing needs packed down to the fascia, RN aware and will change this shift  -Made stepdown status   CVL L subclavian 10/13    LOS: 6 days    Lucretia Roers 12/10/2019

## 2019-12-10 NOTE — Progress Notes (Signed)
PHARMACY - TOTAL PARENTERAL NUTRITION CONSULT NOTE    Indication: Massive bowel obstruction   Patient Measurements: Height: 5\' 1"  (154.9 cm) Weight: 88.8 kg (195 lb 12.3 oz) IBW/kg (Calculated) : 47.8 TPN AdjBW (KG): 54.6 Body mass index is 36.99 kg/m.   Assessment: Status post right hemicolectomy due to perforated bowel, need for second look operation  Glucose / Insulin: 144-159. 9 units given in past 24 hours. Electrolytes: K 3.3   Ca 7.1 (corrected 8.9) Phos 2.2 Mag 1.5  Renal: Stable LFTs / TGs: WNL TG 174 Prealbumin / albumin: albumin 1.8 Intake / Output; MIVF: I/Os: +9984.7 ml since admit NG output: 1000 ml x 24 hrs Drains: 170 ml x 24 hrs  NS @ 90 mL/hr   Surgeries / Procedures: Exploratory laparotomy, partial small bowel resection with ileostomy, central line placement  Central access: 12/06/19 left subclavian triple-lumen central line TPN start date: 12/07/19  Nutritional Goals (per RD recommendation on 10/14):  Kcal:  11/14  kcal Protein:  95g Fluid:  >/= 1.2 L   Goal TPN rate is 60 mL/hr (provides 95 g of protein and 1304 kcals per day.   Current Nutrition:  NPO   10/15: Patient extubated and taken off propofol    Plan:  Potassium Chloride 20 mEq IV total Mag sulfate 2gm IV x 1  Potassium phosphate 15 mmol IV x 1  Continue TPN to 60 mL/hr at 1800 Continue lipids to TPN since propofol discontinued  Electrolytes in TPN: 17mEq/L of Na, 38mEq/L of K, 44mEq/L of Ca, 54mEq/L of Mg, and 57mmol/L of Phos. Cl:Ac ratio 1:2 Add standard MVI and trace elements to TPN Continue moderate every 8 hours SSI and adjust as needed  Continue MIVF at 90 mL/hr at 1800 Monitor TPN labs on Mon/Thurs  30m, PharmD Clinical Pharmacist 12/10/2019 8:40 AM

## 2019-12-11 LAB — COMPREHENSIVE METABOLIC PANEL
ALT: 21 U/L (ref 0–44)
AST: 32 U/L (ref 15–41)
Albumin: 1.5 g/dL — ABNORMAL LOW (ref 3.5–5.0)
Alkaline Phosphatase: 68 U/L (ref 38–126)
Anion gap: 8 (ref 5–15)
BUN: 17 mg/dL (ref 6–20)
CO2: 25 mmol/L (ref 22–32)
Calcium: 7.2 mg/dL — ABNORMAL LOW (ref 8.9–10.3)
Chloride: 108 mmol/L (ref 98–111)
Creatinine, Ser: 0.4 mg/dL — ABNORMAL LOW (ref 0.44–1.00)
GFR, Estimated: >60 mL/min (ref >60)
Glucose, Bld: 162 mg/dL — ABNORMAL HIGH (ref 70–99)
Potassium: 4.3 mmol/L (ref 3.5–5.1)
Sodium: 141 mmol/L (ref 135–145)
Total Bilirubin: 0.8 mg/dL (ref 0.3–1.2)
Total Protein: 5.1 g/dL — ABNORMAL LOW (ref 6.5–8.1)

## 2019-12-11 LAB — CBC
HCT: 28.9 % — ABNORMAL LOW (ref 36.0–46.0)
Hemoglobin: 9.4 g/dL — ABNORMAL LOW (ref 12.0–15.0)
MCH: 29.3 pg (ref 26.0–34.0)
MCHC: 32.5 g/dL (ref 30.0–36.0)
MCV: 90 fL (ref 80.0–100.0)
Platelets: 216 10*3/uL (ref 150–400)
RBC: 3.21 MIL/uL — ABNORMAL LOW (ref 3.87–5.11)
RDW: 14.6 % (ref 11.5–15.5)
WBC: 10.4 10*3/uL (ref 4.0–10.5)
nRBC: 0 % (ref 0.0–0.2)

## 2019-12-11 LAB — TRIGLYCERIDES: Triglycerides: 180 mg/dL — ABNORMAL HIGH (ref <150)

## 2019-12-11 LAB — GLUCOSE, CAPILLARY
Glucose-Capillary: 116 mg/dL — ABNORMAL HIGH (ref 70–99)
Glucose-Capillary: 125 mg/dL — ABNORMAL HIGH (ref 70–99)
Glucose-Capillary: 142 mg/dL — ABNORMAL HIGH (ref 70–99)
Glucose-Capillary: 142 mg/dL — ABNORMAL HIGH (ref 70–99)

## 2019-12-11 LAB — DIFFERENTIAL
Abs Immature Granulocytes: 0.25 10*3/uL — ABNORMAL HIGH (ref 0.00–0.07)
Band Neutrophils: 1 %
Basophils Absolute: 0 10*3/uL (ref 0.0–0.1)
Basophils Relative: 0 %
Eosinophils Absolute: 0 10*3/uL (ref 0.0–0.5)
Eosinophils Relative: 0 %
Lymphocytes Relative: 9 %
Lymphs Abs: 0.9 10*3/uL (ref 0.7–4.0)
Monocytes Absolute: 0.2 10*3/uL (ref 0.1–1.0)
Monocytes Relative: 2 %
Neutro Abs: 9.3 10*3/uL — ABNORMAL HIGH (ref 1.7–7.7)
Neutrophils Relative %: 88 %

## 2019-12-11 LAB — PREALBUMIN: Prealbumin: 6.6 mg/dL — ABNORMAL LOW (ref 18–38)

## 2019-12-11 LAB — MAGNESIUM: Magnesium: 2 mg/dL (ref 1.7–2.4)

## 2019-12-11 LAB — PHOSPHORUS: Phosphorus: 3 mg/dL (ref 2.5–4.6)

## 2019-12-11 MED ORDER — TRAVASOL 10 % IV SOLN
INTRAVENOUS | Status: AC
  Filled 2019-12-11 (×6): qty 936

## 2019-12-11 NOTE — Evaluation (Signed)
Physical Therapy Evaluation Patient Details Name: [name] MRN: [id number] DOB: [birth year] Today's Date: 12/11/2019   History of Present Illness  [name] is an 59 y.o. female a 59 year old white female s/p whoExploratory laparotomy, right hemicolectomy with terminal ileum resection, abdominal washout on 12/04/19 and Exploratory laparotomy, partial small bowel resection with ileostomy, central line placement on 12/06/19  presented the emergency room with worsening abdominal pain, nausea, vomiting, diarrhea.  She states she has been sick for approximately 2 weeks.  A CT scan of the abdomen was performed which revealed perforated bowel with multiple interloop abscesses and free air.  She states it hurts to move around.    Clinical Impression  Patient at high risk for falls, very unsteady on feet having to lean over RW with trunk flexed in order to maintain standing balance, limited to a few sides due to fatigue and generalized weakness. Patient tolerated sitting up in chair after therapy - RN notified.  Patient will benefit from continued physical therapy in hospital and recommended venue below to increase strength, balance, endurance for safe ADLs and gait.      Follow Up Recommendations SNF    Equipment Recommendations  None recommended by PT    Recommendations for Other Services       Precautions / Restrictions Precautions Precautions: Fall Restrictions Weight Bearing Restrictions: No      Mobility  Bed Mobility Overal bed mobility: Needs Assistance Bed Mobility: Rolling;Sidelying to Sit Rolling: Mod assist Sidelying to sit: Mod assist;Max assist Supine to sit: Max assist     General bed mobility comments: slow labored movement, had difficulty propping up on elbow due to weakness  Transfers Overall transfer level: Needs assistance Equipment used: Rolling walker (2 wheeled) Transfers: Sit to/from [employer] Sit to Stand: Mod assist Stand  pivot transfers: Mod assist;Max assist       General transfer comment: slow labored movement  Ambulation/Gait Ambulation/Gait assistance: Min assist;Mod assist Gait Distance (Feet): 4 Feet Assistive device: Rolling walker (2 wheeled) Gait Pattern/deviations: Decreased step length - left;Decreased stance time - right;Decreased stride length;Trunk flexed Gait velocity: decreased   General Gait Details: limited to 4-5 slow labored side steps at bedside due to weakness, has to lean over RW with flexed trunk to maintain standing balance  Stairs            Wheelchair Mobility    Modified Rankin (Stroke Patients Only)       Balance Overall balance assessment: Needs assistance Sitting-balance support: Feet supported;No upper extremity supported Sitting balance-Leahy Scale: Fair Sitting balance - Comments: seated at EOB   Standing balance support: During functional activity;Bilateral upper extremity supported Standing balance-Leahy Scale: Poor Standing balance comment: using RW                             Pertinent Vitals/Pain Pain Assessment: 0-10 Pain Score: 8  Pain Location: chronic low back Pain Descriptors / Indicators: Aching Pain Intervention(s): Limited activity within patient's tolerance;Monitored during session;Repositioned    Home Living Family/patient expects to be discharged to:: Private residence Living Arrangements: Spouse/significant other Available Help at Discharge: Family;Available 24 hours/day Type of Home: House Home Access: Stairs to enter Entrance Stairs-Rails: None Entrance Stairs-Number of Steps: 3-4 Home Layout: Two level;Able to live on main level with bedroom/bathroom;Full bath on main level Home Equipment: Walker - 4 wheels;Shower seat;Cane - single point;Bedside commode      Prior Function  Hand Dominance        Extremity/Trunk Assessment   Upper Extremity Assessment Upper Extremity Assessment:  Generalized weakness    Lower Extremity Assessment Lower Extremity Assessment: Generalized weakness    Cervical / Trunk Assessment Cervical / Trunk Assessment: Kyphotic  Communication   Communication: No difficulties  Cognition Arousal/Alertness: Awake/alert Behavior During Therapy: WFL for tasks assessed/performed Overall Cognitive Status: Within Functional Limits for tasks assessed                                        General Comments      Exercises     Assessment/Plan    PT Assessment Patient needs continued PT services  PT Problem List Decreased strength;Decreased activity tolerance;Decreased balance;Decreased mobility;Decreased knowledge of use of DME       PT Treatment Interventions DME instruction;Gait training;Stair training;Functional mobility training;Therapeutic activities;Therapeutic exercise;Patient/family education;Balance training    PT Goals (Current goals can be found in the Care Plan section)  Acute Rehab PT Goals Patient Stated Goal: return home after rehab PT Goal Formulation: With patient Time For Goal Achievement: 12/25/19 Potential to Achieve Goals: Good    Frequency Min 3X/week   Barriers to discharge        Co-evaluation               AM-PAC PT "6 Clicks" Mobility  Outcome Measure Help needed turning from your back to your side while in a flat bed without using bedrails?: A Lot Help needed moving from lying on your back to sitting on the side of a flat bed without using bedrails?: A Lot Help needed moving to and from a bed to a chair (including a wheelchair)?: A Lot Help needed standing up from a chair using your arms (e.g., wheelchair or bedside chair)?: A Lot Help needed to walk in hospital room?: A Lot Help needed climbing 3-5 steps with a railing? : Total 6 Click Score: 11    End of Session   Activity Tolerance: Patient tolerated treatment well;Patient limited by fatigue Patient left: in chair;with call  bell/phone within reach Nurse Communication: Mobility status PT Visit Diagnosis: Unsteadiness on feet (R26.81);Other abnormalities of gait and mobility (R26.89);Muscle weakness (generalized) (M62.81)    Time: 0867-6195 PT Time Calculation (min) (ACUTE ONLY): 33 min   Charges:   PT Evaluation $PT Eval Moderate Complexity: 1 Mod PT Treatments $Therapeutic Activity: 23-37 mins        2:20 PM, 12/11/19 Ocie Bob, MPT Physical Therapist with [facility] [phone] office [phone] mobile phone

## 2019-12-11 NOTE — Plan of Care (Signed)
  Problem: Acute Rehab PT Goals(only PT should resolve) Goal: Pt Will Go Supine/Side To Sit Outcome: Progressing Flowsheets (Taken 12/11/2019 1422) Pt will go Supine/Side to Sit:  with minimal assist  with moderate assist Goal: Patient Will Transfer Sit To/From Stand Outcome: Progressing Flowsheets (Taken 12/11/2019 1422) Patient will transfer sit to/from stand:  with minimal assist  with moderate assist Goal: Pt Will Transfer Bed To Chair/Chair To Bed Outcome: Progressing Flowsheets (Taken 12/11/2019 1422) Pt will Transfer Bed to Chair/Chair to Bed:  with min assist  with mod assist Goal: Pt Will Ambulate Outcome: Progressing Flowsheets (Taken 12/11/2019 1422) Pt will Ambulate:  15 feet  with moderate assist  with rolling walker   2:23 PM, 12/11/19 Ocie Bob, MPT Physical Therapist with [facility] [phone] office [phone] mobile phone

## 2019-12-11 NOTE — Progress Notes (Signed)
PHARMACY - TOTAL PARENTERAL NUTRITION CONSULT NOTE    Indication: Massive bowel obstruction   Patient Measurements: Height: 5\' 1"  (154.9 cm) Weight: 88.8 kg (195 lb 12.3 oz) IBW/kg (Calculated) : 47.8 TPN AdjBW (KG): 54.6 Body mass index is 36.99 kg/m.   Assessment: Status post right hemicolectomy due to perforated bowel, need for second look operation  Glucose / Insulin: 144-162. 10 units given in past 24 hours. Electrolytes: WNL  Renal: Stable LFTs / TGs: WNL TG 180 Prealbumin / albumin: albumin 1.8 Intake / Output; MIVF: I/Os: +9984.7 ml since admit NG output: 1000 ml x 24 hrs Drains: 170 ml x 24 hrs  NS @ 90 mL/hr   Surgeries / Procedures: Exploratory laparotomy, partial small bowel resection with ileostomy, central line placement  Central access: 12/06/19 left subclavian triple-lumen central line TPN start date: 12/07/19  Nutritional Goals (per RD recommendation on 10/14):  Kcal:  11/14  kcal Protein:  95g Fluid:  >/= 1.2 L   Goal TPN rate is 60 mL/hr (provides 95 g of protein and 1304 kcals per day.   Current Nutrition:  NPO   10/15: Patient extubated and taken off propofol    Plan:  Continue TPN to 60 mL/hr at 1800 Continue lipids to TPN  Electrolytes in TPN: 68mEq/L of Na, 49mEq/L of K, 42mEq/L of Ca, 55mEq/L of Mg, and 9mmol/L of Phos. Cl:Ac ratio 1:2 Add standard MVI and trace elements to TPN Add insulin to TPN and Continue moderate every 6 hours SSI and adjust as needed  Continue MIVF at 90 mL/hr at 1800 Monitor TPN labs on Mon/Thurs  12m, PharmD Clinical Pharmacist 12/11/2019 8:05 AM

## 2019-12-11 NOTE — Progress Notes (Signed)
5 Days Post-Op  Subjective: Patient is sitting in a chair and denies any pain.  She denies any shortness of breath.  Objective: Vital signs in last 24 hours: Temp:  [98.2 F (36.8 C)-98.9 F (37.2 C)] 98.5 F (36.9 C) (10/18 0724) Pulse Rate:  [83-112] 96 (10/18 0900) Resp:  [18-26] 21 (10/18 0900) BP: (114-142)/(47-71) 114/55 (10/18 0800) SpO2:  [95 %-100 %] 100 % (10/18 0900)    Intake/Output from previous day: 10/17 0701 - 10/18 0700 In: 3281.5 [I.V.:2676.7; IV Piggyback:604.8] Out: 4060 [Urine:750; Emesis/NG output:2000; Drains:30; Stool:1280] Intake/Output this shift: Total I/O In: 894.5 [I.V.:872.3; IV Piggyback:22.2] Out: -   General appearance: alert, cooperative and no distress Resp: clear to auscultation bilaterally Cardio: regular rate and rhythm, S1, S2 normal, no murmur, click, rub or gallop GI: Soft, incision healing well by secondary intention.  Ileostomy pink and patent.  JP drainage serosanguineous on the left and minimal on the right.  Lab Results:  Recent Labs    12/09/19 0542 12/11/19 0554  WBC 8.9 10.4  HGB 9.9* 9.4*  HCT 32.9* 28.9*  PLT 295 216   BMET Recent Labs    12/10/19 0522 12/11/19 0554  NA 143 141  K 3.3* 4.3  CL 112* 108  CO2 24 25  GLUCOSE 159* 162*  BUN 15 17  CREATININE 0.39* 0.40*  CALCIUM 7.0* 7.2*   PT/INR No results for input(s): LABPROT, INR in the last 72 hours.  Studies/Results: No results found.  Anti-infectives: Anti-infectives (From admission, onward)   Start     Dose/Rate Route Frequency Ordered Stop   12/04/19 2030  piperacillin-tazobactam (ZOSYN) IVPB 3.375 g        3.375 g 12.5 mL/hr over 240 Minutes Intravenous Every 8 hours 12/04/19 2004     12/04/19 1500  ceFEPIme (MAXIPIME) 2 g in sodium chloride 0.9 % 100 mL IVPB        2 g 200 mL/hr over 30 Minutes Intravenous  Once 12/04/19 1455 12/04/19 1602   12/04/19 1500  metroNIDAZOLE (FLAGYL) IVPB 500 mg        500 mg 100 mL/hr over 60 Minutes  Intravenous  Once 12/04/19 1455 12/04/19 1635      Assessment/Plan: s/p Procedure(s): EXPLORATORY LAPAROTOMY CENTRAL LINE INSERTION PARTIAL SMALL BOWEL RESECTION WITH ILEOSTOMY Impression: Postoperative days 5, 7.  Patient is slowly improving.  She still has significant NG tube output which is not unexpected given the extensiveness of her surgery.  She has been on TPN and seems to be tolerating that well.  Her pain is under control and her breathing is much improved.  Continuing IV Zosyn given intraoperative abscess.  Continue dressing changes.  Day 5 for central line.  LOS: 7 days    [name] 12/11/2019

## 2019-12-11 NOTE — Progress Notes (Addendum)
Nutrition Follow-up  DOCUMENTATION CODES:   Obesity unspecified  INTERVENTION:  TPN per pharmacy   Follow diet advancement   NUTRITION DIAGNOSIS:   Inadequate oral intake related to inability to eat as evidenced by NPO status.   GOAL:   Provide needs based on ASPEN/SCCM guidelines   MONITOR:   Weight trends, Labs, I & O's, Diet advancement, Vent status, Skin  ASSESSMENT:   59 year old female with history of GERD and HTN presented with 2 weeks of diffuse abdominal pain, nausea, vomiting, and diarrhea. CT scan revealed peritonitis with free fluid and free air consistent with perforated bowel.  10/18- Patient remains NPO following exploratory laparotomy, partial small bowel resection with ileostomy. On 10/14 TPN was initiated. Patient extubated 10/15. Nutrition needs re-assessed.   TPN at 60 ml/hr. Provides 1304 kcal, 95 gr protein daily.  Intake: 1319 ml  NGT output 800 ml  Medications reviewed.   Labs: BMP Latest Ref Rng & Units 12/11/2019 12/10/2019 12/09/2019  Glucose 70 - 99 mg/dL 532(Y) 233(I) 356(Y)  BUN 6 - 20 mg/dL 17 15 14   Creatinine 0.44 - 1.00 mg/dL ) 6.16(O) 3.72(B  Sodium 135 - 145 mmol/L 141 143 146(H)  Potassium 3.5 - 5.1 mmol/L 4.3 3.3(L) 3.1(L)  Chloride 98 - 111 mmol/L 108 112(H) 115(H)  CO2 22 - 32 mmol/L 25 24 23   Calcium 8.9 - 10.3 mg/dL 7.2(L) 7.0(L) 7.1(L)    Diet Order:   Diet Order            Diet NPO time specified  Diet effective now                 EDUCATION NEEDS:   No education needs have been identified at this time  Skin:  Skin Assessment: Skin Integrity Issues: Skin Integrity Issues:: Incisions Incisions: closed; abdomen (10/13)  Last BM:  10/18 -320 ml ostomy  Height:   Ht Readings from Last 1 Encounters:  12/08/19 5\' 1"  (1.549 m)    Weight:   Wt Readings from Last 1 Encounters:  12/07/19 88.8 kg    Ideal Body Weight:   48 kg  BMI:  Body mass index is 36.99 kg/m.  Estimated Nutritional Needs:    Kcal:  12/10/19  Protein:  95  Fluid:  > 1200 ml daily   MS,RD,CSG,LDN Pager: 12/09/19

## 2019-12-12 ENCOUNTER — Inpatient Hospital Stay: Payer: Self-pay

## 2019-12-12 LAB — GLUCOSE, CAPILLARY
Glucose-Capillary: 109 mg/dL — ABNORMAL HIGH (ref 70–99)
Glucose-Capillary: 110 mg/dL — ABNORMAL HIGH (ref 70–99)
Glucose-Capillary: 113 mg/dL — ABNORMAL HIGH (ref 70–99)
Glucose-Capillary: 116 mg/dL — ABNORMAL HIGH (ref 70–99)
Glucose-Capillary: 123 mg/dL — ABNORMAL HIGH (ref 70–99)

## 2019-12-12 LAB — TRIGLYCERIDES: Triglycerides: 145 mg/dL (ref <150)

## 2019-12-12 MED ORDER — MENTHOL 3 MG MT LOZG
1.0000 | LOZENGE | OROMUCOSAL | Status: DC | PRN
  Filled 2019-12-12 (×3): qty 9

## 2019-12-12 MED ORDER — SODIUM CHLORIDE 0.9% FLUSH: 10.0000 mL | INTRAVENOUS | Status: DC | PRN

## 2019-12-12 MED ORDER — TRAVASOL 10 % IV SOLN
INTRAVENOUS | Status: AC
  Filled 2019-12-12: qty 936

## 2019-12-12 MED ORDER — SODIUM CHLORIDE 0.9% FLUSH
10.0000 mL | Freq: Two times a day (BID) | INTRAVENOUS | Status: DC
  Administered 2019-12-12 – 2019-12-18 (×11): 10 mL

## 2019-12-12 NOTE — Progress Notes (Signed)
Physical Therapy Treatment Patient Details Name: ELLICE Spencer MRN: [id number] DOB: 14-Oct-1960 Today's Date: 12/12/2019    History of Present Illness [name] is an 59 y.o. female a 59 year old white female s/p whoExploratory laparotomy, right hemicolectomy with terminal ileum resection, abdominal washout on 12/04/19 and Exploratory laparotomy, partial small bowel resection with ileostomy, central line placement on 12/06/19  presented the emergency room with worsening abdominal pain, nausea, vomiting, diarrhea.  She states she has been sick for approximately 2 weeks.  A CT scan of the abdomen was performed which revealed perforated bowel with multiple interloop abscesses and free air.  She states it hurts to move around.    PT Comments    Patient requires Max assistance to move legs during bed mobility, unable to pull self to sitting use bed rails secondary to weakness, demonstrates fair/good return for completing BLE ROM/strengthening exercises while seated at bedside and limited to a few slow labored steps to transfer to chair.  Patient tolerated sitting up in chair after therapy - nursing staff aware.  Patient will benefit from continued physical therapy in hospital and recommended venue below to increase strength, balance, endurance for safe ADLs and gait.    Follow Up Recommendations  SNF     Equipment Recommendations  None recommended by PT    Recommendations for Other Services       Precautions / Restrictions Precautions Precautions: Fall Restrictions Weight Bearing Restrictions: No    Mobility  Bed Mobility Overal bed mobility: Needs Assistance Bed Mobility: Supine to Sit     Supine to sit: Max assist;HOB elevated     General bed mobility comments: slow labored movement requiring use of bed rail, has difficulty moving BLE due to weakness  Transfers Overall transfer level: Needs assistance Equipment used: Rolling walker (2 wheeled) Transfers: Sit to/from  [employer] Sit to Stand: Mod assist Stand pivot transfers: Mod assist       General transfer comment: slow labored movement  Ambulation/Gait Ambulation/Gait assistance: Mod assist Gait Distance (Feet): 5 Feet Assistive device: Rolling walker (2 wheeled) Gait Pattern/deviations: Decreased step length - left;Decreased stance time - right;Decreased stride length;Trunk flexed Gait velocity: decreased   General Gait Details: limited to 5-6 slow labored unsteady short steps at bedside, had difficulty advancing LLE requiring occasional tactile cueing to move left foot   Stairs             Wheelchair Mobility    Modified Rankin (Stroke Patients Only)       Balance Overall balance assessment: Needs assistance Sitting-balance support: Feet supported;No upper extremity supported Sitting balance-Leahy Scale: Fair Sitting balance - Comments: fair/good seated at EOB   Standing balance support: During functional activity;Bilateral upper extremity supported Standing balance-Leahy Scale: Poor Standing balance comment: using RW                            Cognition Arousal/Alertness: Awake/alert Behavior During Therapy: WFL for tasks assessed/performed Overall Cognitive Status: Within Functional Limits for tasks assessed                                        Exercises General Exercises - Lower Extremity Long Arc Quad: Seated;AROM;Strengthening;Both;10 reps Hip Flexion/Marching: Seated;AROM;Strengthening;Both;10 reps Toe Raises: Seated;AROM;Strengthening;Both;15 reps Heel Raises: Seated;AROM;Strengthening;Both;15 reps    General Comments        Pertinent Vitals/Pain Pain Assessment:  Faces Faces Pain Scale: Hurts little more Pain Location: abdomen at surgical site with movement Pain Descriptors / Indicators: Grimacing;Guarding;Discomfort Pain Intervention(s): Limited activity within patient's tolerance;Monitored during  session;Repositioned    Home Living                      Prior Function            PT Goals (current goals can now be found in the care plan section) Acute Rehab PT Goals Patient Stated Goal: return home after rehab PT Goal Formulation: With patient Time For Goal Achievement: 12/25/19 Potential to Achieve Goals: Good Progress towards PT goals: Progressing toward goals    Frequency    Min 3X/week      PT Plan Current plan remains appropriate    Co-evaluation              AM-PAC PT "6 Clicks" Mobility   Outcome Measure  Help needed turning from your back to your side while in a flat bed without using bedrails?: A Lot Help needed moving from lying on your back to sitting on the side of a flat bed without using bedrails?: A Lot Help needed moving to and from a bed to a chair (including a wheelchair)?: A Lot Help needed standing up from a chair using your arms (e.g., wheelchair or bedside chair)?: A Lot Help needed to walk in hospital room?: A Lot Help needed climbing 3-5 steps with a railing? : Total 6 Click Score: 11    End of Session   Activity Tolerance: Patient tolerated treatment well;Patient limited by fatigue Patient left: in chair;with call bell/phone within reach Nurse Communication: Mobility status PT Visit Diagnosis: Unsteadiness on feet (R26.81);Other abnormalities of gait and mobility (R26.89);Muscle weakness (generalized) (M62.81)     Time: 0272-5366 PT Time Calculation (min) (ACUTE ONLY): 28 min  Charges:  $Therapeutic Exercise: 8-22 mins $Therapeutic Activity: 8-22 mins                     2:11 PM, 12/12/19 Ocie Bob, MPT Physical Therapist with [facility] [phone] office [phone] mobile phone

## 2019-12-12 NOTE — TOC Initial Note (Addendum)
Transition of Care [facility]) - Initial/Assessment Note   Patient Details  Name: [name] MRN: [id number] Date of Birth: 10-Sep-1960  Transition of [facility]) CM/SW Contact:    Ewing Schlein, LCSW Phone Number: 12/12/2019, 10:07 AM  Clinical Narrative: Patient is a 59 year old female who was admitted for colon perforation. PT evaluation recommended SNF for rehab. CSW spoke with patient and patient is agreeable to SNF.  FL2 completed and faxed in hub to [place] SNFs. CSW spoke with Trula Ore in admissions at [facility] regarding referral. CSW faxed referral [phone]). TOC awaiting bed offers.  Addendum: 1:29pm-CSW received call from [place] with [employer]. Per Trula Ore, patient's insurance plan is not in-network with the facility so the patient will have a $2800 deductible and will need to cover 50% of the cost.   Expected Discharge Plan: Skilled Nursing Facility Barriers to Discharge: Continued Medical Work up  Patient Goals and CMS Choice CMS Medicare.gov Compare Post Acute Care list provided to:: Patient Choice offered to / list presented to : Patient  Expected Discharge Plan and Services Expected Discharge Plan: Skilled Nursing Facility In-house Referral: Clinical Social Work Discharge Planning Services: NA Post Acute Care Choice: Skilled Nursing Facility Living arrangements for the past 2 months: Single Family Home  Prior Living Arrangements/Services Living arrangements for the past 2 months: Single Family Home Lives with:: Spouse Patient language and need for interpreter reviewed:: Yes Do you feel safe going back to the place where you live?: Yes      Need for Family Participation in Patient Care: No (Comment) Care giver support system in place?: Yes (comment) Criminal Activity/Legal Involvement Pertinent to Current Situation/Hospitalization: No - Comment as needed  Activities of Daily Living ADL Screening (condition at time of admission) Patient's cognitive  ability adequate to safely complete daily activities?: Yes Is the patient deaf or have difficulty hearing?: No Does the patient have difficulty seeing, even when wearing glasses/contacts?: No Does the patient have difficulty concentrating, remembering, or making decisions?: No Patient able to express need for assistance with ADLs?: Yes Does the patient have difficulty dressing or bathing?: No Independently performs ADLs?: Yes (appropriate for developmental age) Does the patient have difficulty walking or climbing stairs?: No Weakness of Legs: Both Weakness of Arms/Hands: None  Permission Sought/Granted Permission sought to share information with : Facility Industrial/product designer granted to share info w AGENCY: SNFs  Emotional Assessment Appearance:: Appears stated age Attitude/Demeanor/Rapport: Engaged Affect (typically observed): Accepting Orientation: : Oriented to Self, Oriented to Place, Oriented to  Time, Oriented to Situation Alcohol / Substance Use: Not Applicable Psych Involvement: No (comment)  Admission diagnosis:  S/P partial colectomy [Z90.49] Patient Active Problem List   Diagnosis Date Noted  . Small bowel perforation (HCC)   . S/P partial colectomy 12/04/2019  . Peritonitis due to abscess (HCC)   . Colon perforation (HCC)   . 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 Primary osteoarthritis of right knee   . Chest pain 03/02/2018  . Acquired trigger finger of right middle finger   . Knee pain, right 04/06/2015  . Dysphagia 10/09/2010  . GERD (gastroesophageal reflux disease) 10/09/2010  . Encounter for screening colonoscopy 10/09/2010   PCP:  Ladon Applebaum Pharmacy:   [facility] [address], [place] - [address] [place] [address] Phone: [phone] Fax: [phone]  Readmission Risk Interventions No flowsheet data found.

## 2019-12-12 NOTE — [place] FL2 (Signed)
[place] MEDICAID FL2 LEVEL OF CARE SCREENING TOOL     IDENTIFICATION  Patient Name: [name] Birthdate: 03-05-1960 Sex: female Admission Date (Current Location): 12/04/2019  [facility] and [place] Number:  [employer] and Address:  [facility],  [address] [place] [address]      Provider Number: [id number]  Attending Physician Name and Address:  Franky Macho, MD  Relative Name and Phone Number:  Nyimah Shadduck (husband) Ph: [phone]    Current Level of Care: Hospital Recommended Level of Care: Skilled Nursing Facility Prior Approval Number:    Date Approved/Denied:   PASRR Number:    Discharge Plan: SNF    Current Diagnoses: Patient Active Problem List   Diagnosis Date Noted  . Small bowel perforation (HCC)   . S/P partial colectomy 12/04/2019  . Peritonitis due to abscess (HCC)   . Colon perforation (HCC)   . 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 Primary osteoarthritis of right knee   . Chest pain 03/02/2018  . Acquired trigger finger of right middle finger   . Knee pain, right 04/06/2015  . Dysphagia 10/09/2010  . GERD (gastroesophageal reflux disease) 10/09/2010  . Encounter for screening colonoscopy 10/09/2010    Orientation RESPIRATION BLADDER Height & Weight     Self, Time, Situation, Place  O2 Continent Weight: 195 lb 12.3 oz (88.8 kg) Height:  5\' 1"  (154.9 cm)  BEHAVIORAL SYMPTOMS/MOOD NEUROLOGICAL BOWEL NUTRITION STATUS      Ileostomy Diet (TPN)  AMBULATORY STATUS COMMUNICATION OF NEEDS Skin   Extensive Assist Verbally Surgical wounds (Incision is on abdomen)                       Personal Care Assistance Level of Assistance  Bathing, Feeding, Dressing Bathing Assistance: Limited assistance Feeding assistance: Independent Dressing Assistance: Limited assistance     Functional Limitations Info  Sight, Hearing, Speech Sight Info: Adequate Hearing Info: Adequate Speech Info: Adequate     SPECIAL CARE FACTORS FREQUENCY  PT (By licensed PT)     PT Frequency: 5x's/week              Contractures Contractures Info: Not present    Additional Factors Info  Code Status, Allergies, Insulin Sliding Scale Code Status Info: Full Allergies Info: 5-alpha Reductase Inhibitors; Maxipime (Cefepime); Metronidazole; Acetaminophen-codeine   Insulin Sliding Scale Info: See discharge summary       Current Medications (12/12/2019):  This is the current hospital active medication list Current Facility-Administered Medications  Medication Dose Route Frequency Provider Last Rate Last Admin  . 0.9 %  sodium chloride infusion   Intravenous Continuous 12/14/2019, MD 90 mL/hr at 12/12/19 0748 Rate Verify at 12/12/19 0748  . Chlorhexidine Gluconate Cloth 2 % PADS 6 each  6 each Topical Daily 12/14/19, MD   6 each at 12/12/19 1019  . enoxaparin (LOVENOX) injection 40 mg  40 mg Subcutaneous Q24H 12/14/19, MD   40 mg at 12/12/19 1020  . fentaNYL (SUBLIMAZE) injection 50 mcg  50 mcg Intravenous Q3H PRN 12/14/19, MD   50 mcg at 12/12/19 0841  . insulin aspart (novoLOG) injection 0-15 Units  0-15 Units Subcutaneous Q6H 04-24-1984, MD   2 Units at 12/12/19 0008  . LORazepam (ATIVAN) injection 1 mg  1 mg Intravenous Q4H PRN 12/14/19, MD   1 mg at 12/09/19 1956  . menthol-cetylpyridinium (CEPACOL) lozenge 3 mg  1 lozenge Oral PRN 12/11/19, MD      .  ondansetron (ZOFRAN-ODT) disintegrating tablet 4 mg  4 mg Oral Q6H PRN Franky Macho, MD       Or  . ondansetron [facility]) injection 4 mg  4 mg Intravenous Q6H PRN Franky Macho, MD   4 mg at 12/12/19 1019  . pantoprazole (PROTONIX) injection 40 mg  40 mg Intravenous QHS Franky Macho, MD   40 mg at 12/11/19 2134  . piperacillin-tazobactam (ZOSYN) IVPB 3.375 g  3.375 g Intravenous Q8H Franky Macho, MD   Stopped at 12/12/19 [phone]  . sodium chloride flush (NS) 0.9 % injection 10-40 mL  10-40 mL Intracatheter Q12H Franky Macho, MD   10 mL at 12/11/19 2134  . sodium chloride flush (NS) 0.9 % injection 10-40 mL  10-40 mL Intracatheter PRN Franky Macho, MD      . TPN ADULT (ION)   Intravenous Continuous TPN Franky Macho, MD 60 mL/hr at 12/12/19 0748 Rate Verify at 12/12/19 0748  . TPN ADULT (ION)   Intravenous Continuous TPN Franky Macho, MD         Discharge Medications: Please see discharge summary for a list of discharge medications.  Relevant Imaging Results:  Relevant Lab Results:   Additional Information SSN: [id number]  Ewing Schlein, LCSW

## 2019-12-12 NOTE — Progress Notes (Signed)
6 Days Post-Op  Subjective: Patient fatigued.  No specific abdominal complaints.  Objective: Vital signs in last 24 hours: Temp:  [98.3 F (36.8 C)-98.8 F (37.1 C)] 98.4 F (36.9 C) (10/19 0724) Pulse Rate:  [84-109] 89 (10/19 0724) Resp:  [18-28] 20 (10/19 0724) BP: (92-131)/(41-69) 104/59 (10/19 0600) SpO2:  [96 %-100 %] 97 % (10/19 0724)    Intake/Output from previous day: 10/18 0701 - 10/19 0700 In: 3243.8 [I.V.:3112.7; IV Piggyback:131.1] Out: 1785 [Urine:550; Emesis/NG output:1150; Drains:35; Stool:50] Intake/Output this shift: Total I/O In: 454.8 [I.V.:420; IV Piggyback:34.8] Out: -   General appearance: alert, cooperative, fatigued and no distress Resp: clear to auscultation bilaterally Cardio: regular rate and rhythm, S1, S2 normal, no murmur, click, rub or gallop GI: Soft.  Left JP drain serosanguineous in nature.  Right JP drain cloudy serous.  Midline abdominal wound with 2 areas of greenish drainage. Ileostomy pink and patent. Lab Results:  Recent Labs    12/11/19 0554  WBC 10.4  HGB 9.4*  HCT 28.9*  PLT 216   BMET Recent Labs    12/10/19 0522 12/11/19 0554  NA 143 141  K 3.3* 4.3  CL 112* 108  CO2 24 25  GLUCOSE 159* 162*  BUN 15 17  CREATININE 0.39* 0.40*  CALCIUM 7.0* 7.2*   PT/INR No results for input(s): LABPROT, INR in the last 72 hours.  Studies/Results: No results found.  Anti-infectives: Anti-infectives (From admission, onward)   Start     Dose/Rate Route Frequency Ordered Stop   12/04/19 2030  piperacillin-tazobactam (ZOSYN) IVPB 3.375 g        3.375 g 12.5 mL/hr over 240 Minutes Intravenous Every 8 hours 12/04/19 2004     12/04/19 1500  ceFEPIme (MAXIPIME) 2 g in sodium chloride 0.9 % 100 mL IVPB        2 g 200 mL/hr over 30 Minutes Intravenous  Once 12/04/19 1455 12/04/19 1602   12/04/19 1500  metroNIDAZOLE (FLAGYL) IVPB 500 mg        500 mg 100 mL/hr over 60 Minutes Intravenous  Once 12/04/19 1455 12/04/19 1635       Assessment/Plan: s/p Procedure(s): EXPLORATORY LAPAROTOMY CENTRAL LINE INSERTION PARTIAL SMALL BOWEL RESECTION WITH ILEOSTOMY Impression: Postoperative days 6 and 8.  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  Patient is already on TPN and has an NG tube in place.  We will continue to monitor.  Continue current management.  LOS: 8 days    Franky Macho 12/12/2019

## 2019-12-12 NOTE — Progress Notes (Signed)
Received call from [place], [facility] stating that [facility] had not sent TPN yet. TPN should be arriving around 1900.

## 2019-12-12 NOTE — Progress Notes (Signed)
Peripherally Inserted Central Catheter Placement  The IV Nurse has discussed with the patient and/or persons authorized to consent for the patient, the purpose of this procedure and the potential benefits and risks involved with this procedure.  The benefits include less needle sticks, lab draws from the catheter, and the patient may be discharged home with the catheter. Risks include, but not limited to, infection, bleeding, blood clot (thrombus formation), and puncture of an artery; nerve damage and irregular heartbeat and possibility to perform a PICC exchange if needed/ordered by physician.  Alternatives to this procedure were also discussed.  Bard Power PICC patient education guide, fact sheet on infection prevention and patient information card has been provided to patient /or left at bedside.    PICC Placement Documentation  PICC Double Lumen 12/12/19 PICC Right Basilic 39 cm 1 cm (Active)  Indication for Insertion or Continuance of Line Administration of hyperosmolar/irritating solutions (i.e. TPN, Vancomycin, etc.) 12/12/19 1522  Exposed Catheter (cm) 1 cm 12/12/19 1522  Site Assessment Clean;Dry;Intact 12/12/19 1522  Lumen #1 Status Blood return noted;Flushed;Saline locked 12/12/19 1522  Lumen #2 Status Blood return noted;Flushed;Saline locked 12/12/19 1522  Dressing Type Transparent 12/12/19 1522  Dressing Status Clean;Dry;Intact 12/12/19 1522  Antimicrobial disc in place? Yes 12/12/19 1522  Safety Lock Not Applicable 12/12/19 1522  Line Care Connections checked and tightened 12/12/19 1522  Dressing Change Due 12/19/19 12/12/19 1522       [place] M 12/12/2019, 3:22 PM

## 2019-12-12 NOTE — Progress Notes (Signed)
PHARMACY - TOTAL PARENTERAL NUTRITION CONSULT NOTE    Indication: Massive bowel obstruction   Patient Measurements: Height: 5\' 1"  (154.9 cm) Weight: 88.8 kg (195 lb 12.3 oz) IBW/kg (Calculated) : 47.8 TPN AdjBW (KG): 54.6 Body mass index is 36.99 kg/m.   Assessment: Status post right hemicolectomy due to perforated bowel, need for second look operation  Glucose / Insulin: 109-125. 4 units given in past 24 hours. Electrolytes: WNL  Renal: Stable LFTs / TGs: WNL TG 145 Prealbumin / albumin: albumin 1.8 Intake / Output; MIVF: I/Os: +9984.7 ml since admit NG output: 1000 ml x 24 hrs Drains: 170 ml x 24 hrs  NS @ 90 mL/hr   Surgeries / Procedures: Exploratory laparotomy, partial small bowel resection with ileostomy, central line placement  Central access: 12/06/19 left subclavian triple-lumen central line TPN start date: 12/07/19  Nutritional Goals (per RD recommendation on 10/14):  Kcal:  11/14  kcal Protein:  95g Fluid:  >/= 1.2 L   Goal TPN rate is 60 mL/hr (provides 95 g of protein and 1304 kcals per day.   Current Nutrition:  NPO   10/15: Patient extubated and taken off propofol    Plan:  Continue TPN to 60 mL/hr at 1800 Continue lipids to TPN  Electrolytes in TPN: 33mEq/L of Na, 55mEq/L of K, 33mEq/L of Ca, 74mEq/L of Mg, and 24mmol/L of Phos. Cl:Ac ratio 1:2 Add standard MVI and trace elements to TPN Continue insulin in TPN and Continue moderate every 6 hours SSI and adjust as needed  Continue MIVF at 90 mL/hr at 1800 Monitor TPN labs on Mon/Thurs  12m, PharmD Clinical Pharmacist 12/12/2019 7:54 AM

## 2019-12-13 LAB — BASIC METABOLIC PANEL
Anion gap: 8 (ref 5–15)
BUN: 17 mg/dL (ref 6–20)
CO2: 23 mmol/L (ref 22–32)
Calcium: 7.3 mg/dL — ABNORMAL LOW (ref 8.9–10.3)
Chloride: 105 mmol/L (ref 98–111)
Creatinine, Ser: 0.4 mg/dL — ABNORMAL LOW (ref 0.44–1.00)
GFR, Estimated: >60 mL/min (ref >60)
Glucose, Bld: 119 mg/dL — ABNORMAL HIGH (ref 70–99)
Potassium: 4.2 mmol/L (ref 3.5–5.1)
Sodium: 136 mmol/L (ref 135–145)

## 2019-12-13 LAB — CBC
HCT: 28.5 % — ABNORMAL LOW (ref 36.0–46.0)
Hemoglobin: 8.9 g/dL — ABNORMAL LOW (ref 12.0–15.0)
MCH: 28.7 pg (ref 26.0–34.0)
MCHC: 31.2 g/dL (ref 30.0–36.0)
MCV: 91.9 fL (ref 80.0–100.0)
Platelets: 346 10*3/uL (ref 150–400)
RBC: 3.1 MIL/uL — ABNORMAL LOW (ref 3.87–5.11)
RDW: 14.5 % (ref 11.5–15.5)
WBC: 18.5 10*3/uL — ABNORMAL HIGH (ref 4.0–10.5)
nRBC: 0 % (ref 0.0–0.2)

## 2019-12-13 LAB — GLUCOSE, CAPILLARY
Glucose-Capillary: 117 mg/dL — ABNORMAL HIGH (ref 70–99)
Glucose-Capillary: 124 mg/dL — ABNORMAL HIGH (ref 70–99)
Glucose-Capillary: 91 mg/dL (ref 70–99)
Glucose-Capillary: 115 mg/dL — ABNORMAL HIGH (ref 70–99)

## 2019-12-13 LAB — LACTIC ACID, PLASMA: Lactic Acid, Venous: 1.5 mmol/L (ref 0.5–1.9)

## 2019-12-13 LAB — PHOSPHORUS: Phosphorus: 3.3 mg/dL (ref 2.5–4.6)

## 2019-12-13 LAB — TRIGLYCERIDES: Triglycerides: 141 mg/dL (ref <150)

## 2019-12-13 LAB — MAGNESIUM: Magnesium: 1.9 mg/dL (ref 1.7–2.4)

## 2019-12-13 MED ORDER — NOREPINEPHRINE 4 MG/250ML-% IV SOLN
2.0000 ug/min | INTRAVENOUS | Status: DC
  Administered 2019-12-13: 2 ug/min via INTRAVENOUS
  Filled 2019-12-13: qty 250

## 2019-12-13 MED ORDER — TRAVASOL 10 % IV SOLN
INTRAVENOUS | Status: AC
  Filled 2019-12-13: qty 936

## 2019-12-13 MED ORDER — SODIUM CHLORIDE 0.9 % IV BOLUS
1000.0000 mL | Freq: Once | INTRAVENOUS | Status: AC
  Administered 2019-12-13: 1000 mL via INTRAVENOUS

## 2019-12-13 MED ORDER — DAKINS (1/4 STRENGTH) 0.125 % EX SOLN
CUTANEOUS | Status: AC | PRN
  Filled 2019-12-13: qty 473

## 2019-12-13 MED ORDER — ALBUMIN HUMAN 25 % IV SOLN: 50.0000 g | Freq: Once | INTRAVENOUS | Status: AC

## 2019-12-13 MED ORDER — SODIUM CHLORIDE 0.9 % IV SOLN: 250.0000 mL | INTRAVENOUS | Status: DC

## 2019-12-13 MED ORDER — ALBUMIN HUMAN 25 % IV SOLN
INTRAVENOUS | Status: AC
  Administered 2019-12-13: 50 g via INTRAVENOUS
  Filled 2019-12-13: qty 200

## 2019-12-13 NOTE — Progress Notes (Signed)
Physical Therapy Treatment Patient Details Name: [name] MRN: [id number] DOB: [birth year] Today's Date: 12/13/2019    History of Present Illness [name] is an 59 y.o. female a 59 year old white female s/p whoExploratory laparotomy, right hemicolectomy with terminal ileum resection, abdominal washout on 12/04/19 and Exploratory laparotomy, partial small bowel resection with ileostomy, central line placement on 12/06/19  presented the emergency room with worsening abdominal pain, nausea, vomiting, diarrhea.  She states she has been sick for approximately 2 weeks.  A CT scan of the abdomen was performed which revealed perforated bowel with multiple interloop abscesses and free air.  She states it hurts to move around.    PT Comments    Patient requires much time and Max assistance for sitting up at bedside with support from upper back due to limited use of BUE secondary to weakness and abdominal discomfort/pain.  Patient tends to lean backwards while completing BLE exercises requiring Min assist to prevent leaning backwards while completing marching in place seated at bedside.  Patient very unsteady on feet having to lean on RW with flexed trunk with slow labored steps to transfer to chair.  Patient tolerated sitting up in chair after therapy - nursing staff aware.  Patient will benefit from continued physical therapy in hospital and recommended venue below to increase strength, balance, endurance for safe ADLs and gait.    Follow Up Recommendations  SNF     Equipment Recommendations  None recommended by PT    Recommendations for Other Services       Precautions / Restrictions Precautions Precautions: Fall Restrictions Weight Bearing Restrictions: No    Mobility  Bed Mobility Overal bed mobility: Needs Assistance Bed Mobility: Supine to Sit     Supine to sit: HOB elevated;Max assist     General bed mobility comments: slow labored movement requiring assistance to  move legs and for pulling self to sitting  Transfers Overall transfer level: Needs assistance Equipment used: Rolling walker (2 wheeled) Transfers: Sit to/from [employer] Sit to Stand: Mod assist Stand pivot transfers: Mod assist       General transfer comment: slow labored movement with difficulty advancing legs due to weakness  Ambulation/Gait Ambulation/Gait assistance: Mod assist;Max assist Gait Distance (Feet): 4 Feet Assistive device: Rolling walker (2 wheeled) Gait Pattern/deviations: Decreased step length - left;Decreased stance time - right;Decreased stride length;Trunk flexed Gait velocity: slow   General Gait Details: limited to 4-5 slow labored steps with difficulty advancing BLE due to weakness, has to lean on RW for support due to poor standing balance/generalized weakness and abdominal pain   Stairs             Wheelchair Mobility    Modified Rankin (Stroke Patients Only)       Balance Overall balance assessment: Needs assistance Sitting-balance support: Feet supported;No upper extremity supported Sitting balance-Leahy Scale: Fair Sitting balance - Comments: seated at EOB, frequent leaning backwards while completing exercises Postural control: Posterior lean Standing balance support: During functional activity;Bilateral upper extremity supported Standing balance-Leahy Scale: Poor Standing balance comment: using RW                            Cognition Arousal/Alertness: Awake/alert Behavior During Therapy: WFL for tasks assessed/performed Overall Cognitive Status: Within Functional Limits for tasks assessed  Exercises General Exercises - Lower Extremity Long Arc Quad: Seated;AROM;Strengthening;Both;10 reps Hip Flexion/Marching: Seated;AROM;Strengthening;Both;10 reps Toe Raises: Seated;AROM;Strengthening;Both;15 reps Heel Raises: Seated;AROM;Strengthening;Both;15  reps    General Comments        Pertinent Vitals/Pain Pain Assessment: 0-10 Pain Score: 8  Pain Location: right side of abdomen Pain Descriptors / Indicators: Grimacing;Guarding;Sore Pain Intervention(s): Limited activity within patient's tolerance;Monitored during session;Premedicated before session;Repositioned    Home Living                      Prior Function            PT Goals (current goals can now be found in the care plan section) Acute Rehab PT Goals Patient Stated Goal: return home after rehab PT Goal Formulation: With patient Time For Goal Achievement: 12/25/19 Potential to Achieve Goals: Good Progress towards PT goals: Progressing toward goals    Frequency           PT Plan Current plan remains appropriate    Co-evaluation              AM-PAC PT "6 Clicks" Mobility   Outcome Measure  Help needed turning from your back to your side while in a flat bed without using bedrails?: A Lot Help needed moving from lying on your back to sitting on the side of a flat bed without using bedrails?: A Lot Help needed moving to and from a bed to a chair (including a wheelchair)?: A Lot Help needed standing up from a chair using your arms (e.g., wheelchair or bedside chair)?: A Lot Help needed to walk in hospital room?: A Lot Help needed climbing 3-5 steps with a railing? : Total 6 Click Score: 11    End of Session   Activity Tolerance: Patient tolerated treatment well;Patient limited by fatigue Patient left: in chair;with call bell/phone within reach Nurse Communication: Mobility status PT Visit Diagnosis: Unsteadiness on feet (R26.81);Other abnormalities of gait and mobility (R26.89);Muscle weakness (generalized) (M62.81)     Time: 2505-3976 PT Time Calculation (min) (ACUTE ONLY): 34 min  Charges:  $Therapeutic Exercise: 8-22 mins $Therapeutic Activity: 8-22 mins                     2:35 PM, 12/13/19 Ocie Bob, MPT Physical Therapist  with [facility] 336 [phone] office [phone] mobile phone

## 2019-12-13 NOTE — Progress Notes (Signed)
7 Days Post-Op  Subjective: No significant change since yesterday.  Patient sitting up in a chair.  Objective: Vital signs in last 24 hours: Temp:  [97.6 F (36.4 C)-99.2 F (37.3 C)] 97.9 F (36.6 C) (10/20 0800) Pulse Rate:  [87-110] 95 (10/20 0800) Resp:  [18-26] 20 (10/20 0800) BP: (90-126)/(40-59) 113/43 (10/20 0800) SpO2:  [94 %-99 %] 99 % (10/20 0800)    Intake/Output from previous day: 10/19 0701 - 10/20 0700 In: 3007.2 [I.V.:2884.8; IV Piggyback:122.5] Out: 2315 [Urine:400; Emesis/NG output:1850; Drains:15; Stool:50] Intake/Output this shift: Total I/O In: 645.9 [I.V.:622.5; IV Piggyback:23.5] Out: -   General appearance: alert, cooperative and no distress Resp: clear to auscultation bilaterally Cardio: regular rate and rhythm, S1, S2 normal, no murmur, click, rub or gallop GI: Soft, ileostomy pink and patent.  JP drainage minimal bilaterally.  Midline wound packing still with greenish drainage.  Lab Results:  Recent Labs    12/11/19 0554  WBC 10.4  HGB 9.4*  HCT 28.9*  PLT 216   BMET Recent Labs    12/11/19 0554 12/13/19 0336  NA 141 136  K 4.3 4.2  CL 108 105  CO2 25 23  GLUCOSE 162* 119*  BUN 17 17  CREATININE 0.40* 0.40*  CALCIUM 7.2* 7.3*   PT/INR No results for input(s): LABPROT, INR in the last 72 hours.  Studies/Results: [place] EKG SITE RITE  Result Date: 12/12/2019 If Site Rite image not attached, placement could not be confirmed due to current cardiac rhythm.  [place] EKG SITE RITE  Result Date: 12/12/2019 If Site Rite image not attached, placement could not be confirmed due to current cardiac rhythm.   Anti-infectives: Anti-infectives (From admission, onward)   Start     Dose/Rate Route Frequency Ordered Stop   12/04/19 2030  piperacillin-tazobactam (ZOSYN) IVPB 3.375 g        3.375 g 12.5 mL/hr over 240 Minutes Intravenous Every 8 hours 12/04/19 2004     12/04/19 1500  ceFEPIme (MAXIPIME) 2 g in sodium chloride 0.9 % 100 mL IVPB         2 g 200 mL/hr over 30 Minutes Intravenous  Once 12/04/19 1455 12/04/19 1602   12/04/19 1500  metroNIDAZOLE (FLAGYL) IVPB 500 mg        500 mg 100 mL/hr over 60 Minutes Intravenous  Once 12/04/19 1455 12/04/19 1635      Assessment/Plan: s/p Procedure(s): EXPLORATORY LAPAROTOMY CENTRAL LINE INSERTION PARTIAL SMALL BOWEL RESECTION WITH ILEOSTOMY Impression: Postoperative day 7 and 9.  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  Will discuss with husband long-term placement for skilled nursing and wound care.  Continue current TPN and NG tube decompression.  Will decrease peripheral IV fluid.  LOS: 9 days    [name] 12/13/2019

## 2019-12-13 NOTE — TOC Progression Note (Addendum)
Transition of Care [facility]) - Progression Note   Patient Details  Name: IDONIA ZOLLINGER MRN: [id number] Date of Birth: [birth year]  [facility] CM/SW Contact  Ewing Schlein, LCSW Phone Number: 12/13/2019, 1:38 PM  Clinical Narrative: Patient requested that CSW follow up with patient's husband, Maleeah Crossman. CSW spoke with husband and he is also agreeable to SNF. Husband accepted bed offer from [place]. CSW spoke with Hospital doctor at [facility] and faxed requested clinical documents so that insurance authorization can be started. TOC awaiting insurance authorization.  Addendum: 2:21pm-CSW received call from [facility] with [facility]. Per [facility], [facility] is rescinding the bed offer due to patient needing TPN.  2:44pm: CSW made referral to [place] with [facility] to review patient.  2:56pm: CSW made referral to [facility] with [facility] to review.  3:24pm: [facility] with [facility] to review patient for LTAC if family gives consent. CSW spoke with husband as patient is sleeping. Husband gave verbal permission for [facility] to try getting approval from [facility] for LTAC. CSW updated Erica. TOC to follow.  Expected Discharge Plan: Skilled Nursing Facility Barriers to Discharge: English as a second language teacher  Expected Discharge Plan and Services Expected Discharge Plan: Skilled Nursing Facility In-house Referral: Clinical Social Work Discharge Planning Services: NA Post Acute Care Choice: Skilled Nursing Facility Living arrangements for the past 2 months: Single Family Home  Readmission Risk Interventions No flowsheet data found.

## 2019-12-13 NOTE — Progress Notes (Addendum)
PHARMACY - TOTAL PARENTERAL NUTRITION CONSULT NOTE    Indication: Massive bowel obstruction   Patient Measurements: Height: 5\' 1"  (154.9 cm) Weight: 88.8 kg (195 lb 12.3 oz) IBW/kg (Calculated) : 47.8 TPN AdjBW (KG): 54.6 Body mass index is 36.99 kg/m.   Assessment: Status post right hemicolectomy due to perforated bowel, need for second look operation  Glucose / Insulin: 110-117. 0 units given in past 24 hours. Electrolytes: WNL  Renal: Stable LFTs / TGs: WNL TG 145 Prealbumin / albumin: albumin 1.8 Intake / Output; MIVF: I/Os: +646 ml since admit NG output: 1000 ml x 24 hrs Drains: 170 ml x 24 hrs  NS @ 90 mL/hr  Surgeries / Procedures: Exploratory laparotomy, partial small bowel resection with ileostomy, central line placement  Central access: 12/06/19 left subclavian triple-lumen central line TPN start date: 12/07/19  Nutritional Goals (per RD recommendation on 10/14):  Kcal:  11/14  kcal Protein:  95g Fluid:  >/= 1.2 L  Goal TPN rate is 60 mL/hr (provides 95 g of protein and 1304 kcals per day.   Current Nutrition:  NPO   10/15: Patient extubated and taken off propofol    Plan:  Continue TPN to 60 mL/hr at 1800 Continue lipids to TPN  Electrolytes in TPN: 13mEq/L of Na, 22mEq/L of K, 3mEq/L of Ca, 30mEq/L of Mg, and 55mmol/L of Phos. Cl:Ac ratio 1:2 Add standard MVI and trace elements to TPN Continue insulin in TPN 5 units and Continue moderate every 6 hours SSI and adjust as needed  Continue MIVF at 90 mL/hr at 1800 Monitor TPN labs on Mon/Thurs  12m, BS Elder Cyphers, Loura Back Clinical Pharmacist Pager [phone] 12/13/2019 8:25 AM

## 2019-12-14 LAB — TRIGLYCERIDES: Triglycerides: 117 mg/dL (ref <150)

## 2019-12-14 LAB — CBC
HCT: 24.1 % — ABNORMAL LOW (ref 36.0–46.0)
Hemoglobin: 7.9 g/dL — ABNORMAL LOW (ref 12.0–15.0)
MCH: 29.7 pg (ref 26.0–34.0)
MCHC: 32.8 g/dL (ref 30.0–36.0)
MCV: 90.6 fL (ref 80.0–100.0)
Platelets: 359 10*3/uL (ref 150–400)
RBC: 2.66 MIL/uL — ABNORMAL LOW (ref 3.87–5.11)
RDW: 14.3 % (ref 11.5–15.5)
WBC: 14.6 10*3/uL — ABNORMAL HIGH (ref 4.0–10.5)
nRBC: 0 % (ref 0.0–0.2)

## 2019-12-14 LAB — COMPREHENSIVE METABOLIC PANEL
ALT: 20 U/L (ref 0–44)
AST: 28 U/L (ref 15–41)
Albumin: 1.9 g/dL — ABNORMAL LOW (ref 3.5–5.0)
Alkaline Phosphatase: 80 U/L (ref 38–126)
Anion gap: 7 (ref 5–15)
BUN: 13 mg/dL (ref 6–20)
CO2: 22 mmol/L (ref 22–32)
Calcium: 7.2 mg/dL — ABNORMAL LOW (ref 8.9–10.3)
Chloride: 106 mmol/L (ref 98–111)
Creatinine, Ser: 0.31 mg/dL — ABNORMAL LOW (ref 0.44–1.00)
GFR, Estimated: >60 mL/min (ref >60)
Glucose, Bld: 123 mg/dL — ABNORMAL HIGH (ref 70–99)
Potassium: 3.6 mmol/L (ref 3.5–5.1)
Sodium: 135 mmol/L (ref 135–145)
Total Bilirubin: 0.9 mg/dL (ref 0.3–1.2)
Total Protein: 5.3 g/dL — ABNORMAL LOW (ref 6.5–8.1)

## 2019-12-14 LAB — MAGNESIUM: Magnesium: 1.9 mg/dL (ref 1.7–2.4)

## 2019-12-14 LAB — PREPARE RBC (CROSSMATCH)

## 2019-12-14 LAB — GLUCOSE, CAPILLARY
Glucose-Capillary: 101 mg/dL — ABNORMAL HIGH (ref 70–99)
Glucose-Capillary: 103 mg/dL — ABNORMAL HIGH (ref 70–99)
Glucose-Capillary: 105 mg/dL — ABNORMAL HIGH (ref 70–99)
Glucose-Capillary: 115 mg/dL — ABNORMAL HIGH (ref 70–99)

## 2019-12-14 LAB — PHOSPHORUS: Phosphorus: 2.6 mg/dL (ref 2.5–4.6)

## 2019-12-14 MED ORDER — POTASSIUM PHOSPHATES 15 MMOLE/5ML IV SOLN
15.0000 mmol | Freq: Once | INTRAVENOUS | Status: AC
  Administered 2019-12-14: 15 mmol via INTRAVENOUS
  Filled 2019-12-14: qty 5

## 2019-12-14 MED ORDER — TRAVASOL 10 % IV SOLN
INTRAVENOUS | Status: AC
  Filled 2019-12-14: qty 936

## 2019-12-14 MED ORDER — LOPERAMIDE HCL 1 MG/7.5ML PO SUSP
4.0000 mg | Freq: Four times a day (QID) | ORAL | Status: DC
  Administered 2019-12-14 – 2019-12-17 (×9): 4 mg
  Filled 2019-12-14 (×27): qty 30

## 2019-12-14 MED ORDER — MAGNESIUM SULFATE 2 GM/50ML IV SOLN
2.0000 g | Freq: Once | INTRAVENOUS | Status: AC
  Administered 2019-12-14: 2 g via INTRAVENOUS
  Filled 2019-12-14: qty 50

## 2019-12-14 MED ORDER — SODIUM CHLORIDE 0.9% IV SOLUTION: Freq: Once | INTRAVENOUS | Status: AC

## 2019-12-14 MED ORDER — FLUCONAZOLE IN SODIUM CHLORIDE 400-0.9 MG/200ML-% IV SOLN
400.0000 mg | INTRAVENOUS | Status: DC
  Administered 2019-12-14 – 2019-12-18 (×5): 400 mg via INTRAVENOUS
  Filled 2019-12-14 (×6): qty 200

## 2019-12-14 MED ORDER — FLUCONAZOLE IN SODIUM CHLORIDE 400-0.9 MG/200ML-% IV SOLN
400.0000 mg | Freq: Once | INTRAVENOUS | Status: DC
  Filled 2019-12-14: qty 200

## 2019-12-14 NOTE — Progress Notes (Signed)
Pharmacy Antibiotic Note  [name] is a 59 y.o. female admitted on 12/04/2019 with   intra-abdominal infection.  Pharmacy has been consulted for fluconazole dosing.  Today is day #11 of Zosyn therapy for intra-operative abscess.  Plan: Start fluconazole 400mg  IV q24h Pharmacy will continue to monitor cultures, renal function and patient progress.  Height: 5\' 1"  (154.9 cm) Weight: 87.4 kg (192 lb 10.9 oz) IBW/kg (Calculated) : 47.8  Temp (24hrs), Avg:99.3 F (37.4 C), Min:98.6 F (37 C), Max:100.7 F (38.2 C)  Recent Labs  Lab 12/08/19 0359 12/08/19 0647 12/09/19 0542 12/10/19 0522 12/11/19 0554 12/13/19 0336 12/13/19 1405 12/13/19 1406 12/14/19 0510  WBC  --  9.0 8.9  --  10.4  --  18.5*  --  14.6*  CREATININE   < >  --  0.44 0.39* 0.40* 0.40*  --   --  0.31*  LATICACIDVEN  --   --   --   --   --   --   --  1.5  --    < > = values in this interval not displayed.    Estimated Creatinine Clearance: 76 mL/min (A) (by C-G formula based on SCr of 0.31 mg/dL (L)).     Antimicrobials this admission: fluconazole 10/21 >>   Zosyn 10/11>>    Microbiology results: 10/11 BCx:  NGF 10/11 UCx: NGF  10/11 Resp PCR: SARS CoV-2 negative; Flu A/B negative 10/11 Wound: K. oxytoca (pan sens) 10/11 MRSA PCR: negative  Thank you for allowing pharmacy to be a part of this patient's care.  11/21 12/14/2019 11:29 AM

## 2019-12-14 NOTE — Progress Notes (Signed)
PHARMACY - TOTAL PARENTERAL NUTRITION CONSULT NOTE    Indication: Massive bowel obstruction   Patient Measurements: Height: 5' 1"  (154.9 cm) Weight: 88.8 kg (195 lb 12.3 oz) IBW/kg (Calculated) : 47.8 TPN AdjBW (KG): 54.6 Body mass index is 36.99 kg/m.   Assessment: Status post right hemicolectomy due to perforated bowel, need for second look operation  Glucose / Insulin: 101-123--> 2 units of aspart  given in past 24 hours  Electrolytes:   Calcium: 7.2 Corrected Ca: 8.45m/dL Na: 135 K: 3.6   Mg: 1.9  Renal:  Scr 0.366mdL--> creatinine clearance likely over-estimated  LFT: AST: 28   ALT: 20   Alk phos: 80  Total Bili: 0.9   TG:  145>141>117   Prealbumin:  n/a Albumin:  1.9  Intake / Output:    Intake/Output Summary (Last 24 hours) at 12/14/2019 0907 Last data filed at 12/14/2019 0700 Gross per 24 hour  Intake 3508.55 ml  Output 2370 ml  Net 1138.55 ml    NG output: 1000 ml  in past  24h Drains:  2054mn in past 24h  MIVF: NS @ 90 mL/hr  Surgeries / Procedures: Exploratory laparotomy, partial small bowel resection with ileostomy, central line placement  Central access: 12/06/19 left subclavian triple-lumen central line TPN start date: 12/07/19  Nutritional Goals (per RD recommendation on 10/14):  Kcal:  1202876-8115cal Protein:  95g Fluid:  >/= 1.2 L  Goal TPN rate is 60 mL/hr (provides 95 g of protein and 1304 kcals per day.   Current Nutrition:  NPO   10/15: Patient extubated and taken off propofol    Plan:  Give KPhos 15 mmol x1 dose now for phos 1.9 and K 3.6 Give Magnesium 1g IV x1 dose now for Mg of 1.9 Continue lipids in  TPN  Electrolytes in TPN: add 32m64m of Na, 60mE38mof K, 5mEq/21mf Ca, 15mEq/5m Mg, and 15mmol/6m Phos.  Cl:Ac ratio  Continue at 1:2 for CO2 of 22 Add standard MVI and trace elements to TPN Change insulin in TPN to  12 units   Continue moderate  SSI q6h--> adjust as needed  Continue MIVF at 90 mL/hr at  1800 Monitor TPN labs on Mon/Thurs   Jenine Krisher MDespina Pole D. Clinical Pharmacist 12/14/2019 9:13 AM

## 2019-12-14 NOTE — TOC Progression Note (Signed)
Transition of Care [facility]) - Progression Note   Patient Details  Name: [name] MRN: [id number] Date of Birth: 04-Oct-1960  Transition of Care [facility]) CM/SW Contact  Ewing Schlein, LCSW Phone Number: 12/14/2019, 2:03 PM  Clinical Narrative: CSW received update from [facility] with [facility] that the clinicals were sent to [facility] to see if LTAC will be approved. TOC to follow.  Expected Discharge Plan: Skilled Nursing Facility Barriers to Discharge: English as a second language teacher  Expected Discharge Plan and Services Expected Discharge Plan: Skilled Nursing Facility In-house Referral: Clinical Social Work Discharge Planning Services: NA Post Acute Care Choice: Skilled Nursing Facility Living arrangements for the past 2 months: Single Family Home  Readmission Risk Interventions No flowsheet data found.

## 2019-12-14 NOTE — Progress Notes (Addendum)
8 Days Post-Op  Subjective: Patient alert this morning.  Denies any significant pain.  Objective: Vital signs in last 24 hours: Temp:  [98.6 F (37 C)-100.7 F (38.2 C)] 98.6 F (37 C) (10/21 0522) Pulse Rate:  [84-131] 93 (10/21 0745) Resp:  [18-32] 21 (10/21 0745) BP: (74-179)/(13-82) 128/39 (10/21 0745) SpO2:  [93 %-100 %] 98 % (10/21 0745)    Intake/Output from previous day: 10/20 0701 - 10/21 0700 In: 4154.5 [I.V.:4012.1; IV Piggyback:142.4] Out: 2370 [Urine:1250; Emesis/NG output:1000; Drains:20; Stool:100] Intake/Output this shift: No intake/output data recorded.  General appearance: alert, cooperative and no distress Resp: clear to auscultation bilaterally Cardio: regular rate and rhythm, S1, S2 normal, no murmur, click, rub or gallop GI: Soft, ileostomy with output, mucosa pink.  Midline incision with dressing in place, bilious in appearance.  JP drainage minimal bilaterally.  Lab Results:  Recent Labs    12/13/19 1405 12/14/19 0510  WBC 18.5* 14.6*  HGB 8.9* 7.9*  HCT 28.5* 24.1*  PLT 346 359   BMET Recent Labs    12/13/19 0336 12/14/19 0510  NA 136 135  K 4.2 3.6  CL 105 106  CO2 23 22  GLUCOSE 119* 123*  BUN 17 13  CREATININE 0.40* 0.31*  CALCIUM 7.3* 7.2*   PT/INR No results for input(s): LABPROT, INR in the last 72 hours.  Studies/Results: [place] EKG SITE RITE  Result Date: 12/12/2019 If Site Rite image not attached, placement could not be confirmed due to current cardiac rhythm.  [place] EKG SITE RITE  Result Date: 12/12/2019 If Site Rite image not attached, placement could not be confirmed due to current cardiac rhythm.   Anti-infectives: Anti-infectives (From admission, onward)   Start     Dose/Rate Route Frequency Ordered Stop   12/04/19 2030  piperacillin-tazobactam (ZOSYN) IVPB 3.375 g        3.375 g 12.5 mL/hr over 240 Minutes Intravenous Every 8 hours 12/04/19 2004     12/04/19 1500  ceFEPIme (MAXIPIME) 2 g in sodium chloride 0.9 %  100 mL IVPB        2 g 200 mL/hr over 30 Minutes Intravenous  Once 12/04/19 1455 12/04/19 1602   12/04/19 1500  metroNIDAZOLE (FLAGYL) IVPB 500 mg        500 mg 100 mL/hr over 60 Minutes Intravenous  Once 12/04/19 1455 12/04/19 1635      Assessment/Plan: s/p Procedure(s): EXPLORATORY LAPAROTOMY CENTRAL LINE INSERTION PARTIAL SMALL BOWEL RESECTION WITH ILEOSTOMY Impression: Postoperative day 8 and 10.  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  She did respond with IV boluses and albumin.  We will give her 1 unit of packed red blood cells to help support her blood pressure.  She is off her Levophed.  Discharge planning ongoing.  Leukocytosis resolving.  Continue current IV antibiotic.  Will get wound culture as I am concerned that she may need Diflucan.  Will get pharmacy consultation.   LOS: 10 days    Franky Macho 12/14/2019

## 2019-12-15 LAB — BPAM RBC
Blood Product Expiration Date: 202111202359
ISSUE DATE / TIME: 202110210944
Unit Type and Rh: 9500

## 2019-12-15 LAB — CBC
HCT: 29.6 % — ABNORMAL LOW (ref 36.0–46.0)
Hemoglobin: 9.4 g/dL — ABNORMAL LOW (ref 12.0–15.0)
MCH: 29.2 pg (ref 26.0–34.0)
MCHC: 31.8 g/dL (ref 30.0–36.0)
MCV: 91.9 fL (ref 80.0–100.0)
Platelets: 444 10*3/uL — ABNORMAL HIGH (ref 150–400)
RBC: 3.22 MIL/uL — ABNORMAL LOW (ref 3.87–5.11)
RDW: 14.3 % (ref 11.5–15.5)
WBC: 14.5 10*3/uL — ABNORMAL HIGH (ref 4.0–10.5)
nRBC: 0 % (ref 0.0–0.2)

## 2019-12-15 LAB — TYPE AND SCREEN
ABO/RH(D): O NEG
Antibody Screen: NEGATIVE
Unit division: 0

## 2019-12-15 LAB — GLUCOSE, CAPILLARY
Glucose-Capillary: 129 mg/dL — ABNORMAL HIGH (ref 70–99)
Glucose-Capillary: 134 mg/dL — ABNORMAL HIGH (ref 70–99)
Glucose-Capillary: 111 mg/dL — ABNORMAL HIGH (ref 70–99)

## 2019-12-15 LAB — TRIGLYCERIDES: Triglycerides: 108 mg/dL (ref <150)

## 2019-12-15 MED ORDER — TRAVASOL 10 % IV SOLN
INTRAVENOUS | Status: AC
  Filled 2019-12-15: qty 936

## 2019-12-15 NOTE — Progress Notes (Signed)
Physical Therapy Treatment Patient Details Name: [name] MRN: [id number] DOB: 1960/08/13 Today's Date: 12/15/2019    History of Present Illness [name] is an 59 y.o. female a 59 year old white female s/p whoExploratory laparotomy, right hemicolectomy with terminal ileum resection, abdominal washout on 12/04/19 and Exploratory laparotomy, partial small bowel resection with ileostomy, central line placement on 12/06/19  presented the emergency room with worsening abdominal pain, nausea, vomiting, diarrhea.  She states she has been sick for approximately 2 weeks.  A CT scan of the abdomen was performed which revealed perforated bowel with multiple interloop abscesses and free air.  She states it hurts to move around.    PT Comments    Patient demonstrates slow labored movement for sitting up at bedside having to use bed rail and support from upper back to avoid falling backwards, had most difficulty getting back into bed with episode of falling backwards, instructed to use bed rail and required side lying and Mod/max assist to move legs to get back into bed.  Patient limited to a few slow labored steps at bedside due to fall risk and fatigue, tolerated sitting up for 1 hour before requesting to go back to bed.  Patient will benefit from continued physical therapy in hospital and recommended venue below to increase strength, balance, endurance for safe ADLs and gait.   Follow Up Recommendations  SNF     Equipment Recommendations  None recommended by PT    Recommendations for Other Services       Precautions / Restrictions Precautions Precautions: Fall Restrictions Weight Bearing Restrictions: No    Mobility  Bed Mobility Overal bed mobility: Needs Assistance Bed Mobility: Supine to Sit;Rolling;Sit to Sidelying Rolling: Mod assist Sidelying to sit: Max assist     Sit to sidelying: Max assist General bed mobility comments: had to lay on side using bed rail when  getting back to bed secondary to falling backwards when not holding onto bed rail  Transfers Overall transfer level: Needs assistance Equipment used: Rolling walker (2 wheeled) Transfers: Sit to/from [employer] Sit to Stand: Mod assist Stand pivot transfers: Mod assist       General transfer comment: slow labored movement with difficulty advancing legs due to weakness  Ambulation/Gait Ambulation/Gait assistance: Mod assist;Max assist Gait Distance (Feet): 4 Feet Assistive device: Rolling walker (2 wheeled) Gait Pattern/deviations: Decreased step length - left;Decreased stance time - right;Decreased stride length;Trunk flexed Gait velocity: slow   General Gait Details: limited to 3-4 slow labored steps with difficulty advancing BLE due to weakness, has to lean on RW for support due to poor standing balance/generalized weakness and abdominal pain   Stairs             Wheelchair Mobility    Modified Rankin (Stroke Patients Only)       Balance Overall balance assessment: Needs assistance Sitting-balance support: Feet supported;No upper extremity supported Sitting balance-Leahy Scale: Fair Sitting balance - Comments: fair/good seated at EOB   Standing balance support: During functional activity;Bilateral upper extremity supported Standing balance-Leahy Scale: Poor Standing balance comment: using RW                            Cognition Arousal/Alertness: Awake/alert Behavior During Therapy: WFL for tasks assessed/performed Overall Cognitive Status: Within Functional Limits for tasks assessed  Exercises General Exercises - Lower Extremity Long Arc Quad: Seated;AROM;Strengthening;Both;10 reps Hip Flexion/Marching: Seated;AROM;Strengthening;Both;10 reps Toe Raises: Seated;AROM;Strengthening;Both;10 reps Heel Raises: Seated;AROM;Strengthening;Both;10 reps    General Comments         Pertinent Vitals/Pain Pain Assessment: Faces Faces Pain Scale: Hurts even more Pain Location: right side of abdomen Pain Descriptors / Indicators: Grimacing;Guarding;Sore Pain Intervention(s): Limited activity within patient's tolerance;Monitored during session;Premedicated before session;Repositioned    Home Living                      Prior Function            PT Goals (current goals can now be found in the care plan section) Acute Rehab PT Goals Patient Stated Goal: return home after rehab PT Goal Formulation: With patient Time For Goal Achievement: 12/25/19 Potential to Achieve Goals: Good Progress towards PT goals: Progressing toward goals    Frequency    Min 3X/week      PT Plan Current plan remains appropriate    Co-evaluation              AM-PAC PT "6 Clicks" Mobility   Outcome Measure  Help needed turning from your back to your side while in a flat bed without using bedrails?: A Lot Help needed moving from lying on your back to sitting on the side of a flat bed without using bedrails?: A Lot Help needed moving to and from a bed to a chair (including a wheelchair)?: A Lot Help needed standing up from a chair using your arms (e.g., wheelchair or bedside chair)?: A Lot Help needed to walk in hospital room?: A Lot Help needed climbing 3-5 steps with a railing? : Total 6 Click Score: 11    End of Session Equipment Utilized During Treatment: Oxygen Activity Tolerance: Patient tolerated treatment well;Patient limited by fatigue;Patient limited by pain Patient left: in chair;with call bell/phone within reach Nurse Communication: Mobility status PT Visit Diagnosis: Unsteadiness on feet (R26.81);Other abnormalities of gait and mobility (R26.89);Muscle weakness (generalized) (M62.81)     Time: 7628-3151 PT Time Calculation (min) (ACUTE ONLY): 38 min  Charges:  $Therapeutic Exercise: 8-22 mins $Therapeutic Activity: 8-22 mins                      2:14 PM, 12/15/19 Ocie Bob, MPT Physical Therapist with [facility] 336 [phone] office [phone] mobile phone

## 2019-12-15 NOTE — Progress Notes (Signed)
9 Days Post-Op  Subjective: Patient alert and oriented this morning.  States her pain is intermittently controlled.  Objective: Vital signs in last 24 hours: Temp:  [98.5 F (36.9 C)-99.7 F (37.6 C)] 98.5 F (36.9 C) (10/22 0815) Pulse Rate:  [84-112] 102 (10/22 0800) Resp:  [18-28] 26 (10/22 0800) BP: (99-143)/(35-61) 105/40 (10/22 0730) SpO2:  [93 %-100 %] 94 % (10/22 0800)    Intake/Output from previous day: 10/21 0701 - 10/22 0700 In: 2608.5 [I.V.:1548.2; Blood:315; IV Piggyback:745.3] Out: 955 [Urine:500; Emesis/NG output:200; Drains:55; Stool:200] Intake/Output this shift: No intake/output data recorded.  General appearance: alert, cooperative and no distress Resp: clear to auscultation bilaterally Cardio: regular rate and rhythm, S1, S2 normal, no murmur, click, rub or gallop GI: Soft, ileostomy pink and patent.  Midline wound with green bowel contents still present.  Adipose tissue somewhat dusky in nature, but is difficult to tell due to succus in the wound.  Lab Results:  Recent Labs    12/14/19 0510 12/15/19 0331  WBC 14.6* 14.5*  HGB 7.9* 9.4*  HCT 24.1* 29.6*  PLT 359 444*   BMET Recent Labs    12/13/19 0336 12/14/19 0510  NA 136 135  K 4.2 3.6  CL 105 106  CO2 23 22  GLUCOSE 119* 123*  BUN 17 13  CREATININE 0.40* 0.31*  CALCIUM 7.3* 7.2*   PT/INR No results for input(s): LABPROT, INR in the last 72 hours.  Studies/Results: No results found.  Anti-infectives: Anti-infectives (From admission, onward)   Start     Dose/Rate Route Frequency Ordered Stop   12/14/19 1215  fluconazole (DIFLUCAN) IVPB 400 mg        400 mg 100 mL/hr over 120 Minutes Intravenous Every 24 hours 12/14/19 1128     12/14/19 1000  fluconazole (DIFLUCAN) IVPB 400 mg  Status:  Discontinued        400 mg 100 mL/hr over 120 Minutes Intravenous Once 12/14/19 0916 12/14/19 1128   12/04/19 2030  piperacillin-tazobactam (ZOSYN) IVPB 3.375 g        3.375 g 12.5 mL/hr over  240 Minutes Intravenous Every 8 hours 12/04/19 2004     12/04/19 1500  ceFEPIme (MAXIPIME) 2 g in sodium chloride 0.9 % 100 mL IVPB        2 g 200 mL/hr over 30 Minutes Intravenous  Once 12/04/19 1455 12/04/19 1602   12/04/19 1500  metroNIDAZOLE (FLAGYL) IVPB 500 mg        500 mg 100 mL/hr over 60 Minutes Intravenous  Once 12/04/19 1455 12/04/19 1635      Assessment/Plan: s/p Procedure(s): EXPLORATORY LAPAROTOMY CENTRAL LINE INSERTION PARTIAL SMALL BOWEL RESECTION WITH ILEOSTOMY Impression: Postoperative day eight and 10.  Enterocutaneous fistula still present.  Was started on Imodium 4 mg 4 times daily via the NG tube.  Hopefully this will slow her fistulous output.  Her ileostomy is patent.  Diflucan has been added.  Awaiting placement.  LOS: 11 days    Franky Macho 12/15/2019

## 2019-12-15 NOTE — TOC Progression Note (Signed)
Transition of Care [facility]) - Progression Note   Patient Details  Name: [name] MRN: [id number] Date of Birth: 01/07/1961  [facility] CM/SW Contact  Ewing Schlein, LCSW Phone Number: 12/15/2019, 12:22 PM  Clinical Narrative: CSW received voicemail from [place] with [facility]. Per Florestine Avers [facility] will not be able to review the patient for pre-certification until Monday (12/18/19) due to a waitlist for admissions. CSW spoke with Alcario Drought with [facility]. Per Alcario Drought, clinicals were submitted to patient's BCBS, but [employer] has 48-72 hours to respond on whether LTAC will be approved or not. TOC to follow.  Expected Discharge Plan: Skilled Nursing Facility Barriers to Discharge: English as a second language teacher  Expected Discharge Plan and Services Expected Discharge Plan: Skilled Nursing Facility In-house Referral: Clinical Social Work Discharge Planning Services: NA Post Acute Care Choice: Skilled Nursing Facility Living arrangements for the past 2 months: Single Family Home  Readmission Risk Interventions No flowsheet data found.

## 2019-12-15 NOTE — Progress Notes (Signed)
PHARMACY - TOTAL PARENTERAL NUTRITION CONSULT NOTE    Indication: Massive bowel obstruction   Patient Measurements: Height: 5' 1"  (154.9 cm) Weight: 87.4 kg (192 lb 10.9 oz) IBW/kg (Calculated) : 47.8 TPN AdjBW (KG): 54.6 Body mass index is 36.41 kg/m.   Assessment: Status post right hemicolectomy due to perforated bowel, need for second look operation  Glucose / Insulin: 105-129--> 2 units of aspart  given in past 24 hours  Electrolytes:   Calcium: 7.2 Corrected Ca: 8.21m/dL Na: 135 K: 3.6   Mg: 1.9  Renal:  Scr 0.339mdL--> creatinine clearance likely over-estimated  LFT: AST: 28   ALT: 20   Alk phos: 80  Total Bili: 0.9   TG:  145>141>117> 108   Prealbumin:  n/a Albumin:  1.9  Intake / Output:    Intake/Output Summary (Last 24 hours) at 12/15/2019 0828 Last data filed at 12/15/2019 0511 Gross per 24 hour  Intake 2608.48 ml  Output 955 ml  Net 1653.48 ml    NG output: 1000 ml  in past  24h Drains:  2044mn in past 24h  MIVF: NS @ 90 mL/hr  Surgeries / Procedures: Exploratory laparotomy, partial small bowel resection with ileostomy, central line placement  Central access: 12/06/19 left subclavian triple-lumen central line TPN start date: 12/07/19  Nutritional Goals (per RD recommendation on 10/14):  Kcal:  1204496-7591 kcal Protein:  95g Fluid:  >/= 1.2 L  Goal TPN rate is 60 mL/hr (provides 95 g of protein and 1304 kcals per day.   Current Nutrition:  NPO   10/15: Patient extubated and taken off propofol    Plan:  Continue lipids in  TPN  Electrolytes in TPN:  98m59m of Na, 60mE65mof K, 5mEq/25mf Ca, 15mEq/52m Mg, and 15mmol/27m Phos.  Cl:Ac ratio  Continue at 1:2 for CO2 of 22 Add standard MVI and trace elements to TPN Continue insulin in TPN to  12 units   Continue moderate  SSI q6h--> adjust as needed  Continue MIVF at 90 mL/hr at 1800 Monitor TPN labs on Mon/Thurs  Jara Feider PoIsac Sarnarm D,[place] Pharmacist Pager  #[phone] 8:28 AM

## 2019-12-16 LAB — CBC WITH DIFFERENTIAL/PLATELET
Abs Immature Granulocytes: 0.21 10*3/uL — ABNORMAL HIGH (ref 0.00–0.07)
Basophils Absolute: 0.1 10*3/uL (ref 0.0–0.1)
Basophils Relative: 1 %
Eosinophils Absolute: 0.6 10*3/uL — ABNORMAL HIGH (ref 0.0–0.5)
Eosinophils Relative: 5 %
HCT: 28.3 % — ABNORMAL LOW (ref 36.0–46.0)
Hemoglobin: 9 g/dL — ABNORMAL LOW (ref 12.0–15.0)
Lymphocytes Relative: 15 %
Lymphs Abs: 1.9 10*3/uL (ref 0.7–4.0)
MCH: 29.2 pg (ref 26.0–34.0)
MCHC: 31.8 g/dL (ref 30.0–36.0)
MCV: 91.9 fL (ref 80.0–100.0)
Monocytes Absolute: 1 10*3/uL (ref 0.1–1.0)
Monocytes Relative: 8 %
Neutro Abs: 9.2 10*3/uL — ABNORMAL HIGH (ref 1.7–7.7)
Neutrophils Relative %: 71 %
Platelets: 542 10*3/uL — ABNORMAL HIGH (ref 150–400)
RBC: 3.08 MIL/uL — ABNORMAL LOW (ref 3.87–5.11)
RDW: 14.2 % (ref 11.5–15.5)
WBC: 12.9 10*3/uL — ABNORMAL HIGH (ref 4.0–10.5)
nRBC: 0 % (ref 0.0–0.2)

## 2019-12-16 LAB — GLUCOSE, CAPILLARY
Glucose-Capillary: 106 mg/dL — ABNORMAL HIGH (ref 70–99)
Glucose-Capillary: 111 mg/dL — ABNORMAL HIGH (ref 70–99)
Glucose-Capillary: 113 mg/dL — ABNORMAL HIGH (ref 70–99)
Glucose-Capillary: 117 mg/dL — ABNORMAL HIGH (ref 70–99)
Glucose-Capillary: 125 mg/dL — ABNORMAL HIGH (ref 70–99)

## 2019-12-16 LAB — TRIGLYCERIDES: Triglycerides: 105 mg/dL (ref <150)

## 2019-12-16 MED ORDER — INSULIN ASPART 100 UNIT/ML ~~LOC~~ SOLN
0.0000 [IU] | Freq: Three times a day (TID) | SUBCUTANEOUS | Status: DC
  Administered 2019-12-17 – 2019-12-18 (×3): 2 [IU] via SUBCUTANEOUS

## 2019-12-16 MED ORDER — OCTREOTIDE ACETATE 100 MCG/ML IJ SOLN: 100.0000 ug | Freq: Every day | INTRAMUSCULAR | Status: DC

## 2019-12-16 MED ORDER — OCTREOTIDE ACETATE 100 MCG/ML IJ SOLN
100.0000 ug | Freq: Three times a day (TID) | INTRAMUSCULAR | Status: DC
  Administered 2019-12-16 – 2019-12-18 (×8): 100 ug via SUBCUTANEOUS
  Filled 2019-12-16 (×15): qty 1

## 2019-12-16 MED ORDER — TRAVASOL 10 % IV SOLN
INTRAVENOUS | Status: AC
  Filled 2019-12-16: qty 936

## 2019-12-16 NOTE — Progress Notes (Signed)
Subjective:  CC: [name] is a 59 y.o. female  Hospital stay day 12, s/p Ex-lap, right hemicolectomy, subsequent take back, SB resection  HPI: No acute changes overnight reported.    ROS:  General: Denies weight loss, weight gain, fatigue, fevers, chills, and night sweats. Heart: Denies chest pain, palpitations, racing heart, irregular heartbeat, leg pain or swelling, and decreased activity tolerance. Respiratory: Denies breathing difficulty, shortness of breath, wheezing, cough, and sputum. GI: Denies change in appetite, heartburn, nausea, vomiting, constipation, diarrhea, and blood in stool. GU: Denies difficulty urinating, pain with urinating, urgency, frequency, blood in urine.   Objective:   Temp:  [98.5 F (36.9 C)-99.3 F (37.4 C)] 99.3 F (37.4 C) (10/23 0756) Pulse Rate:  [91-128] 95 (10/23 0756) Resp:  [20-35] 20 (10/23 0756) BP: (103-154)/(37-63) 103/42 (10/23 0600) SpO2:  [83 %-98 %] 96 % (10/23 0756) Weight:  [82.2 kg] 82.2 kg (10/23 0410)     Height: 5\' 1"  (154.9 cm) Weight: 82.2 kg BMI (Calculated): 34.26   Intake/Output this shift:   Intake/Output Summary (Last 24 hours) at 12/16/2019 0926 Last data filed at 12/16/2019 12/18/2019 Gross per 24 hour  Intake 3620.79 ml  Output 2205 ml  Net 1415.79 ml    Constitutional :  alert, cooperative, appears stated age and no distress  Respiratory:  clear to auscultation bilaterally  Cardiovascular:  regular rate and rhythm  Gastrointestinal: soft, open midline wound with retention sutures intact.  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  wound edges with thin necrotic tissue.  base unable to be visualized at bedside.   Skin: Cool and moist.   Psychiatric: Normal affect, non-agitated, not confused       LABS:  CMP Latest Ref Rng & Units 12/14/2019 12/13/2019 12/11/2019  Glucose 70 - 99 mg/dL 12/13/2019) 488(Q) 916(X)  BUN 6 - 20 mg/dL 13 17 17   Creatinine 0.44  - 1.00 mg/dL 450(T) ) 8.88(K)  Sodium 135 - 145 mmol/L 135 136 141  Potassium 3.5 - 5.1 mmol/L 3.6 4.2 4.3  Chloride 98 - 111 mmol/L 106 105 108  CO2 22 - 32 mmol/L 22 23 25   Calcium 8.9 - 10.3 mg/dL 7.2(L) 7.3(L) 7.2(L)  Total Protein 6.5 - 8.1 g/dL 5.3(L) - 5.1(L)  Total Bilirubin 0.3 - 1.2 mg/dL 0.9 - 0.8  Alkaline Phos 38 - 126 U/L 80 - 68  AST 15 - 41 U/L 28 - 32  ALT 0 - 44 U/L 20 - 21   CBC Latest Ref Rng & Units 12/16/2019 12/15/2019 12/14/2019  WBC 4.0 - 10.5 K/uL 12.9(H) 14.5(H) 14.6(H)  Hemoglobin 12.0 - 15.0 g/dL 9.0(L) 9.4(L) 7.9(L)  Hematocrit 36 - 46 % 28.3(L) 29.6(L) 24.1(L)  Platelets 150 - 400 K/uL 542(H) 444(H) 359    RADS: n/a Assessment:   s/p Ex-lap, right hemicolectomy, subsequent take back, SB resection.  Clinically stable, with decreasing wbc, no acute issues reported overnight.  Amount of enteric output from midline remains stable per RN report.  immodium started yesterday but sounds like no change in total output.  Will add octreotide to see if we can see any decrease in output.  Frequent dressing changes to minimize excoriation of skin as well.  Wound vac not an option due to retention suture and active soilage.  Will try applying wick urinary catheter to dependent portion to see if that can divert some of drainage away.   Necrotic subq fat along wound edges were easily removed at bedside,  with viable, tender pink, granulation tissue noted deep to it.  Will continue to monitor closely.

## 2019-12-16 NOTE — Progress Notes (Signed)
PHARMACY - TOTAL PARENTERAL NUTRITION CONSULT NOTE    Indication: Massive bowel obstruction   Patient Measurements: Height: _0  (154.9 cm) Weight: 82.2 kg (181 lb 3.5 oz) IBW/kg (Calculated) : 47.8 TPN AdjBW (KG): 54.6 Body mass index is 34.24 kg/m.   Assessment: Status post right hemicolectomy due to perforated bowel, need for second look operation. Fistula still with high output.  Glucose / Insulin: 105-129--> 4 units of aspart  given in past 24 hours  Electrolytes:   Calcium: 7.2 Corrected Ca: 8.66m/dL Na: 135 K: 3.6   Mg: 1.9  Renal:  Scr 0.332mdL--> creatinine clearance likely over-estimated  LFT: AST: 28   ALT: 20   Alk phos: 80  Total Bili: 0.9   TG:  145>141>117> 108   Prealbumin:  n/a Albumin:  1.9  Intake / Output:    Intake/Output Summary (Last 24 hours) at 12/16/2019 0855 Last data filed at 12/16/2019 067342ross per 24 hour  Intake 3620.79 ml  Output 2205 ml  Net 1415.79 ml    NG output: 1000 ml  in past  24h Drains:  203mn in past 24h  MIVF: NS @ 90 mL/hr  Surgeries / Procedures: Exploratory laparotomy, partial small bowel resection with ileostomy, central line placement  Central access: 12/06/19 left subclavian triple-lumen central line TPN start date: 12/07/19  Nutritional Goals (per RD recommendation on 10/14):  Kcal:  1208768-1157 kcal Protein:  95g Fluid:  >/= 1.2 L  Goal TPN rate is 60 mL/hr (provides 95 g of protein and 1304 kcals per day.   Current Nutrition:  NPO   10/15: Patient extubated and taken off propofol    Plan:  Continue lipids in  TPN  Electrolytes in TPN:  26m54m of Na, 60mE61mof K, 5mEq/71mf Ca, 15mEq/63m Mg, and 15mmol/53m Phos.  Cl:Ac ratio  Continue at 1:2 for CO2 of 22 Add standard MVI and trace elements to TPN Continue insulin in TPN to  14 units   Continue moderate  SSI q8h--> adjust as needed  Continue MIVF at 90 mL/hr at 1800 Monitor TPN labs on Mon/Thurs  Zane Pellecchia PoIsac Sarnarm D,[place] Pharmacist Pager #[phone] 8:55 AM

## 2019-12-17 LAB — GLUCOSE, CAPILLARY
Glucose-Capillary: 127 mg/dL — ABNORMAL HIGH (ref 70–99)
Glucose-Capillary: 109 mg/dL — ABNORMAL HIGH (ref 70–99)
Glucose-Capillary: 111 mg/dL — ABNORMAL HIGH (ref 70–99)

## 2019-12-17 LAB — TRIGLYCERIDES: Triglycerides: 301 mg/dL — ABNORMAL HIGH (ref <150)

## 2019-12-17 MED ORDER — GERHARDT'S BUTT CREAM
TOPICAL_CREAM | Freq: Three times a day (TID) | CUTANEOUS | Status: DC
  Filled 2019-12-17 (×2): qty 1

## 2019-12-17 MED ORDER — TRAVASOL 10 % IV SOLN
INTRAVENOUS | Status: AC
  Filled 2019-12-17: qty 936

## 2019-12-17 NOTE — Progress Notes (Signed)
Subjective:  CC: [name] is a 59 y.o. female  Hospital stay day 12, s/p Ex-lap, right hemicolectomy, subsequent take back, SB resection  HPI: No acute changes overnight reported.    ROS:  General: Denies weight loss, weight gain, fatigue, fevers, chills, and night sweats. Heart: Denies chest pain, palpitations, racing heart, irregular heartbeat, leg pain or swelling, and decreased activity tolerance. Respiratory: Denies breathing difficulty, shortness of breath, wheezing, cough, and sputum. GI: Denies change in appetite, heartburn, nausea, vomiting, constipation, diarrhea, and blood in stool. GU: Denies difficulty urinating, pain with urinating, urgency, frequency, blood in urine.   Objective:   Temp:  [98.4 F (36.9 C)-100.1 F (37.8 C)] 99.1 F (37.3 C) (10/24 1107) Pulse Rate:  [87-122] 122 (10/24 1200) Resp:  [19-28] 25 (10/24 1107) BP: (106-145)/(43-62) 125/52 (10/24 1100) SpO2:  [94 %-99 %] 95 % (10/24 1200)     Height: 5\' 1"  (154.9 cm) Weight: 82.2 kg BMI (Calculated): 34.26   Intake/Output this shift:   Intake/Output Summary (Last 24 hours) at 12/17/2019 1424 Last data filed at 12/17/2019 1200 Gross per 24 hour  Intake 3434.13 ml  Output 3350 ml  Net 84.13 ml    Constitutional :  alert, cooperative, appears stated age and no distress  Respiratory:  clear to auscultation bilaterally  Cardiovascular:  regular rate and rhythm  Gastrointestinal: soft, open midline wound with retention sutures intact.  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  wound edges with good granulation tissue but with enteric coating again.  base of wound again unable to be visualized at bedside.   Skin: Cool and moist.   Psychiatric: Normal affect, non-agitated, not confused       LABS:  CMP Latest Ref Rng & Units 12/14/2019 12/13/2019 12/11/2019  Glucose 70 - 99 mg/dL 12/13/2019) 637(C) 588(F)  BUN 6 - 20 mg/dL 13 17 17    Creatinine 0.44 - 1.00 mg/dL 027(X) ) 4.12(I)  Sodium 135 - 145 mmol/L 135 136 141  Potassium 3.5 - 5.1 mmol/L 3.6 4.2 4.3  Chloride 98 - 111 mmol/L 106 105 108  CO2 22 - 32 mmol/L 22 23 25   Calcium 8.9 - 10.3 mg/dL 7.2(L) 7.3(L) 7.2(L)  Total Protein 6.5 - 8.1 g/dL 5.3(L) - 5.1(L)  Total Bilirubin 0.3 - 1.2 mg/dL 0.9 - 0.8  Alkaline Phos 38 - 126 U/L 80 - 68  AST 15 - 41 U/L 28 - 32  ALT 0 - 44 U/L 20 - 21   CBC Latest Ref Rng & Units 12/17/2019 12/16/2019 12/15/2019  WBC 4.0 - 10.5 K/uL 11.5(H) 12.9(H) 14.5(H)  Hemoglobin 12.0 - 15.0 g/dL 12/19/2019) 12/18/2019) 12/17/2019)  Hematocrit 36 - 46 % 29.3(L) 28.3(L) 29.6(L)  Platelets 150 - 400 K/uL 634(H) 542(H) 444(H)    RADS: n/a Assessment:   s/p Ex-lap, right hemicolectomy, subsequent take back, SB resection.  Clinically stable, with decreasing wbc, no acute issues reported overnight.  Amount of enteric output from midline remains unchanged. currently on octreotide and immodium. Will monitor to see if any decrease over next few days.  Frequent dressing changes to minimize excoriation of skin as well. Zinc oxide ointment added for excoriation.

## 2019-12-17 NOTE — Progress Notes (Addendum)
Physical Therapy Treatment Patient Details Name: [name] MRN: [id number] DOB: 07-29-1960 Today's Date: 12/17/2019    History of Present Illness [name] is an 59 y.o. female a 59 year old white female s/p whoExploratory laparotomy, right hemicolectomy with terminal ileum resection, abdominal washout on 12/04/19 and Exploratory laparotomy, partial small bowel resection with ileostomy, central line placement on 12/06/19  presented the emergency room with worsening abdominal pain, nausea, vomiting, diarrhea.  She states she has been sick for approximately 2 weeks.  A CT scan of the abdomen was performed which revealed perforated bowel with multiple interloop abscesses and free air.  She states it hurts to move around.    PT Comments    Patient demonstrates slight improvement for using BUE and bed rail for rolling to side and sitting up from side lying position with less c/o pain at surgery site, demonstrates good return for completing BLE ROM/strengthening exercises while seated at bedside, and able to take few slow labored side steps during transfer to chair.  Patient tolerated sitting up in chair after therapy with her spouse present in room - RN aware.  Patient tolerated sitting up for approximate 1.5 hours before requesting to go back to bed.  Patient will benefit from continued physical therapy in hospital and recommended venue below to increase strength, balance, endurance for safe ADLs and gait.      Follow Up Recommendations  SNF     Equipment Recommendations  None recommended by PT    Recommendations for Other Services       Precautions / Restrictions Precautions Precautions: Fall Restrictions Weight Bearing Restrictions: No    Mobility  Bed Mobility Overal bed mobility: Needs Assistance Bed Mobility: Rolling;Sidelying to Sit Rolling: Mod assist Sidelying to sit: Mod assist;Max assist;HOB elevated       General bed mobility comments: demonstrates slight  improvement for using BUE during side lying to sitting using bedrail and HOB raised  Transfers Overall transfer level: Needs assistance Equipment used: Rolling walker (2 wheeled) Transfers: Sit to/from [employer] Sit to Stand: Mod assist Stand pivot transfers: Mod assist       General transfer comment: slow labored movement with difficulty advancing legs due to weakness  Ambulation/Gait Ambulation/Gait assistance: Mod assist;Max assist Gait Distance (Feet): 5 Feet Assistive device: Rolling walker (2 wheeled) Gait Pattern/deviations: Decreased step length - left;Decreased stance time - right;Decreased stride length;Trunk flexed Gait velocity: slow   General Gait Details: limited to 4-5 slow labored side steps with slight improvement for advancing feet, limited secondary to fatigue, poor standing balance   Stairs             Wheelchair Mobility    Modified Rankin (Stroke Patients Only)       Balance Overall balance assessment: Needs assistance Sitting-balance support: Feet supported;No upper extremity supported Sitting balance-Leahy Scale: Fair Sitting balance - Comments: fair/good supporting self with BUE   Standing balance support: During functional activity;Bilateral upper extremity supported Standing balance-Leahy Scale: Poor Standing balance comment: using RW                            Cognition Arousal/Alertness: Awake/alert Behavior During Therapy: WFL for tasks assessed/performed Overall Cognitive Status: Within Functional Limits for tasks assessed  Exercises General Exercises - Lower Extremity Long Arc Quad: Seated;AROM;Strengthening;Both;10 reps Hip Flexion/Marching: Seated;AROM;Strengthening;10 reps Toe Raises: Seated;AROM;Strengthening;Both;15 reps Heel Raises: Seated;AROM;Strengthening;Both;15 reps    General Comments        Pertinent Vitals/Pain Pain  Assessment: Faces Faces Pain Scale: Hurts little more Pain Location: right side of abdomen Pain Descriptors / Indicators: Grimacing;Guarding;Sore Pain Intervention(s): Limited activity within patient's tolerance;Monitored during session;Premedicated before session    Home Living                      Prior Function            PT Goals (current goals can now be found in the care plan section) Acute Rehab PT Goals Patient Stated Goal: return home after rehab PT Goal Formulation: With patient Time For Goal Achievement: 12/25/19 Potential to Achieve Goals: Good Progress towards PT goals: Progressing toward goals    Frequency    Min 3X/week      PT Plan Current plan remains appropriate    Co-evaluation              AM-PAC PT "6 Clicks" Mobility   Outcome Measure  Help needed turning from your back to your side while in a flat bed without using bedrails?: A Lot Help needed moving from lying on your back to sitting on the side of a flat bed without using bedrails?: A Lot Help needed moving to and from a bed to a chair (including a wheelchair)?: A Lot Help needed standing up from a chair using your arms (e.g., wheelchair or bedside chair)?: A Lot Help needed to walk in hospital room?: A Lot Help needed climbing 3-5 steps with a railing? : Total 6 Click Score: 11    End of Session Equipment Utilized During Treatment: Oxygen Activity Tolerance: Patient tolerated treatment well;Patient limited by fatigue;Patient limited by pain Patient left: in chair;with call bell/phone within reach Nurse Communication: Mobility status PT Visit Diagnosis: Unsteadiness on feet (R26.81);Other abnormalities of gait and mobility (R26.89);Muscle weakness (generalized) (M62.81)     Time: 1610-9604 PT Time Calculation (min) (ACUTE ONLY): 28 min  Charges:  $Therapeutic Exercise: 8-22 mins $Therapeutic Activity: 8-22 mins                     1:01 PM, 12/17/19 Ocie Bob,  MPT Physical Therapist with [facility] [phone] office [phone] mobile phone

## 2019-12-17 NOTE — Progress Notes (Signed)
Dressing was saturated changed again.

## 2019-12-17 NOTE — Progress Notes (Signed)
Pharmacy Antibiotic Note  [name] is a 59 y.o. female admitted on 12/04/2019 with   intra-abdominal infection.  Pharmacy has been consulted for fluconazole dosing.  Today is day #14 of Zosyn therapy for intra-operative abscess. Remains on TPN, fistula output has decreased. Afebrile  Plan: Continue fluconazole 400mg  IV q24h F/U LOT on zosyn Pharmacy will continue to monitor cultures, renal function and patient progress.  Height: 5\' 1"  (154.9 cm) Weight: 82.2 kg (181 lb 3.5 oz) IBW/kg (Calculated) : 47.8  Temp (24hrs), Avg:99.1 F (37.3 C), Min:98.4 F (36.9 C), Max:100.1 F (37.8 C)  Recent Labs  Lab 12/11/19 0554 12/11/19 0554 12/13/19 0336 12/13/19 1405 12/13/19 1406 12/14/19 0510 12/15/19 0331 12/16/19 0902 12/17/19 0328  WBC 10.4   < >  --  18.5*  --  14.6* 14.5* 12.9* 11.5*  CREATININE 0.40*  --  0.40*  --   --  0.31*  --   --   --   LATICACIDVEN  --   --   --   --  1.5  --   --   --   --    < > = values in this interval not displayed.    Estimated Creatinine Clearance: 73.6 mL/min (A) (by C-G formula based on SCr of 0.31 mg/dL (L)).     Antimicrobials this admission: fluconazole 10/21 >>   Zosyn 10/11>>    Microbiology results: 10/21 WCX: GNR, GPR, GPC rare yeast 10/11 BCx:  NGF 10/11 UCx: NGF  10/11 Resp PCR: SARS CoV-2 negative; Flu A/B negative 10/11 Wound: K. Oxytoca (pan sens except amp) 10/11 MRSA PCR: negative  Thank you for allowing pharmacy to be a part of this patient's care.  11/21, BS Pharm D, BCPS Clinical Pharmacist Pager [phone] 12/17/2019 8:25 AM

## 2019-12-17 NOTE — Progress Notes (Signed)
Patient was able to sit in chair for about 2 hours this morning. Changed abdominal dressing after placing back in bed.

## 2019-12-17 NOTE — Progress Notes (Signed)
PHARMACY - TOTAL PARENTERAL NUTRITION CONSULT NOTE    Indication: Massive bowel obstruction   Patient Measurements: Height: _0  (154.9 cm) Weight: 82.2 kg (181 lb 3.5 oz) IBW/kg (Calculated) : 47.8 TPN AdjBW (KG): 54.6 Body mass index is 34.24 kg/m.   Assessment: Status post right hemicolectomy due to perforated bowel, need for second look operation. Fistula  with high output. Octreotide added 10/23 to see if we can see any decrease in output, appears it has helped.  Glucose / Insulin: 113-127--> 2 units of aspart  given in past 24 hours  Electrolytes:   Calcium: 7.2 Corrected Ca: 8.38m/dL Na: 135 K: 3.6   Mg: 1.9  Renal:  Scr 0.393mdL--> creatinine clearance likely over-estimated  LFT: AST: 28   ALT: 20   Alk phos: 80  Total Bili: 0.9   TG:  145>141>117> 108   Prealbumin:  n/a Albumin:  1.9  Intake / Output:    Intake/Output Summary (Last 24 hours) at 12/17/2019 080086ast data filed at 12/17/2019 0645 Gross per 24 hour  Intake 3283.82 ml  Output 2450 ml  Net 833.82 ml    NG output: 1000 ml  in past  24h Drains:  2073mn in past 24h  MIVF: NS @ 90 mL/hr  Surgeries / Procedures: Exploratory laparotomy, partial small bowel resection with ileostomy, central line placement  Central access: 12/06/19 left subclavian triple-lumen central line TPN start date: 12/07/19  Nutritional Goals (per RD recommendation on 10/14):  Kcal:  1207619-5093 kcal Protein:  95g Fluid:  >/= 1.2 L  Goal TPN rate is 60 mL/hr (provides 95 g of protein and 1304 kcals per day.   Current Nutrition:  NPO   10/15: Patient extubated and taken off propofol    Plan:  Continue lipids in  TPN  Electrolytes in TPN:  67m75m of Na, 60mE88mof K, 5mEq/51mf Ca, 15mEq/60m Mg, and 15mmol/31m Phos.  Cl:Ac ratio  Continue at 1:2 for CO2 of 22 Add standard MVI and trace elements to TPN Continue insulin in TPN to  14 units   Continue moderate  SSI q8h--> adjust as needed  Continue MIVF at  90 mL/hr at 1800 Monitor TPN labs on Mon/Thurs  Tashauna Caisse PoIsac Sarnarm D,[place]ena [place] Pharmacist Pager #[phone] 8:21 AM

## 2019-12-18 ENCOUNTER — Other Ambulatory Visit (HOSPITAL_COMMUNITY): Payer: BC Managed Care – PPO

## 2019-12-18 ENCOUNTER — Inpatient Hospital Stay
Admission: EM | Admit: 2019-12-18 | Discharge: 2020-03-08 | Disposition: A | Discharge status: Home Health | Payer: BC Managed Care – PPO | Source: Other Acute Inpatient Hospital | Attending: Internal Medicine | Admitting: Internal Medicine

## 2019-12-18 DIAGNOSIS — K9189 Other postprocedural complications and disorders of digestive system: Secondary | ICD-10-CM | POA: Diagnosis not present

## 2019-12-18 DIAGNOSIS — R109 Unspecified abdominal pain: Secondary | ICD-10-CM | POA: Diagnosis not present

## 2019-12-18 DIAGNOSIS — R52 Pain, unspecified: Secondary | ICD-10-CM | POA: Diagnosis not present

## 2019-12-18 DIAGNOSIS — R0600 Dyspnea, unspecified: Secondary | ICD-10-CM | POA: Diagnosis not present

## 2019-12-18 DIAGNOSIS — T8132XA Disruption of internal operation (surgical) wound, not elsewhere classified, initial encounter: Secondary | ICD-10-CM | POA: Diagnosis not present

## 2019-12-18 DIAGNOSIS — K912 Postsurgical malabsorption, not elsewhere classified: Secondary | ICD-10-CM | POA: Diagnosis not present

## 2019-12-18 DIAGNOSIS — N12 Tubulo-interstitial nephritis, not specified as acute or chronic: Secondary | ICD-10-CM | POA: Diagnosis not present

## 2019-12-18 DIAGNOSIS — K632 Fistula of intestine: Secondary | ICD-10-CM | POA: Diagnosis not present

## 2019-12-18 DIAGNOSIS — K760 Fatty (change of) liver, not elsewhere classified: Secondary | ICD-10-CM | POA: Diagnosis not present

## 2019-12-18 DIAGNOSIS — E871 Hypo-osmolality and hyponatremia: Secondary | ICD-10-CM | POA: Diagnosis not present

## 2019-12-18 DIAGNOSIS — E8770 Fluid overload, unspecified: Secondary | ICD-10-CM

## 2019-12-18 DIAGNOSIS — D649 Anemia, unspecified: Secondary | ICD-10-CM | POA: Diagnosis not present

## 2019-12-18 DIAGNOSIS — G8929 Other chronic pain: Secondary | ICD-10-CM | POA: Diagnosis not present

## 2019-12-18 DIAGNOSIS — B961 Klebsiella pneumoniae [K. pneumoniae] as the cause of diseases classified elsewhere: Secondary | ICD-10-CM | POA: Diagnosis not present

## 2019-12-18 DIAGNOSIS — K659 Peritonitis, unspecified: Secondary | ICD-10-CM | POA: Diagnosis not present

## 2019-12-18 DIAGNOSIS — K631 Perforation of intestine (nontraumatic): Secondary | ICD-10-CM | POA: Diagnosis not present

## 2019-12-18 DIAGNOSIS — F1120 Opioid dependence, uncomplicated: Secondary | ICD-10-CM | POA: Diagnosis not present

## 2019-12-18 DIAGNOSIS — B9620 Unspecified Escherichia coli [E. coli] as the cause of diseases classified elsewhere: Secondary | ICD-10-CM | POA: Diagnosis not present

## 2019-12-18 DIAGNOSIS — R531 Weakness: Secondary | ICD-10-CM | POA: Diagnosis not present

## 2019-12-18 DIAGNOSIS — R1909 Other intra-abdominal and pelvic swelling, mass and lump: Secondary | ICD-10-CM | POA: Diagnosis not present

## 2019-12-18 DIAGNOSIS — D72829 Elevated white blood cell count, unspecified: Secondary | ICD-10-CM | POA: Diagnosis not present

## 2019-12-18 DIAGNOSIS — I96 Gangrene, not elsewhere classified: Secondary | ICD-10-CM | POA: Diagnosis not present

## 2019-12-18 DIAGNOSIS — G8918 Other acute postprocedural pain: Secondary | ICD-10-CM | POA: Diagnosis not present

## 2019-12-18 DIAGNOSIS — G894 Chronic pain syndrome: Secondary | ICD-10-CM | POA: Diagnosis not present

## 2019-12-18 DIAGNOSIS — L89153 Pressure ulcer of sacral region, stage 3: Secondary | ICD-10-CM | POA: Diagnosis not present

## 2019-12-18 DIAGNOSIS — E46 Unspecified protein-calorie malnutrition: Secondary | ICD-10-CM | POA: Diagnosis not present

## 2019-12-18 DIAGNOSIS — G8928 Other chronic postprocedural pain: Secondary | ICD-10-CM | POA: Diagnosis not present

## 2019-12-18 DIAGNOSIS — K651 Peritoneal abscess: Secondary | ICD-10-CM | POA: Diagnosis not present

## 2019-12-18 DIAGNOSIS — B952 Enterococcus as the cause of diseases classified elsewhere: Secondary | ICD-10-CM | POA: Diagnosis not present

## 2019-12-18 DIAGNOSIS — G4700 Insomnia, unspecified: Secondary | ICD-10-CM | POA: Diagnosis not present

## 2019-12-18 DIAGNOSIS — R1031 Right lower quadrant pain: Secondary | ICD-10-CM | POA: Diagnosis not present

## 2019-12-18 DIAGNOSIS — R5381 Other malaise: Secondary | ICD-10-CM | POA: Diagnosis not present

## 2019-12-18 DIAGNOSIS — Z9049 Acquired absence of other specified parts of digestive tract: Secondary | ICD-10-CM

## 2019-12-18 DIAGNOSIS — T85598A Other mechanical complication of other gastrointestinal prosthetic devices, implants and grafts, initial encounter: Secondary | ICD-10-CM

## 2019-12-18 LAB — COMPREHENSIVE METABOLIC PANEL
ALT: 18 U/L (ref 0–44)
AST: 23 U/L (ref 15–41)
Albumin: 1.5 g/dL — ABNORMAL LOW (ref 3.5–5.0)
Alkaline Phosphatase: 89 U/L (ref 38–126)
Anion gap: 8 (ref 5–15)
BUN: 15 mg/dL (ref 6–20)
CO2: 24 mmol/L (ref 22–32)
Calcium: 7.7 mg/dL — ABNORMAL LOW (ref 8.9–10.3)
Chloride: 101 mmol/L (ref 98–111)
Creatinine, Ser: 0.37 mg/dL — ABNORMAL LOW (ref 0.44–1.00)
GFR, Estimated: >60 mL/min (ref >60)
Glucose, Bld: 121 mg/dL — ABNORMAL HIGH (ref 70–99)
Potassium: 4.1 mmol/L (ref 3.5–5.1)
Sodium: 133 mmol/L — ABNORMAL LOW (ref 135–145)
Total Bilirubin: 0.6 mg/dL (ref 0.3–1.2)
Total Protein: 6.1 g/dL — ABNORMAL LOW (ref 6.5–8.1)

## 2019-12-18 LAB — CBC
HCT: 28.6 % — ABNORMAL LOW (ref 36.0–46.0)
Hemoglobin: 8.8 g/dL — ABNORMAL LOW (ref 12.0–15.0)
MCH: 28.5 pg (ref 26.0–34.0)
MCHC: 30.8 g/dL (ref 30.0–36.0)
MCV: 92.6 fL (ref 80.0–100.0)
Platelets: 691 10*3/uL — ABNORMAL HIGH (ref 150–400)
RBC: 3.09 MIL/uL — ABNORMAL LOW (ref 3.87–5.11)
RDW: 14.2 % (ref 11.5–15.5)
WBC: 12.5 10*3/uL — ABNORMAL HIGH (ref 4.0–10.5)
nRBC: 0 % (ref 0.0–0.2)

## 2019-12-18 LAB — DIFFERENTIAL
Abs Immature Granulocytes: 0.47 10*3/uL — ABNORMAL HIGH (ref 0.00–0.07)
Basophils Absolute: 0.1 10*3/uL (ref 0.0–0.1)
Basophils Relative: 1 %
Eosinophils Absolute: 0.9 10*3/uL — ABNORMAL HIGH (ref 0.0–0.5)
Eosinophils Relative: 7 %
Lymphocytes Relative: 17 %
Lymphs Abs: 2.1 10*3/uL (ref 0.7–4.0)
Metamyelocytes Relative: 1 %
Monocytes Absolute: 2 10*3/uL — ABNORMAL HIGH (ref 0.1–1.0)
Monocytes Relative: 16 %
Neutro Abs: 7.1 10*3/uL (ref 1.7–7.7)
Neutrophils Relative %: 57 %
Promyelocytes Relative: 1 %

## 2019-12-18 LAB — MAGNESIUM: Magnesium: 1.8 mg/dL (ref 1.7–2.4)

## 2019-12-18 LAB — GLUCOSE, CAPILLARY
Glucose-Capillary: 124 mg/dL — ABNORMAL HIGH (ref 70–99)
Glucose-Capillary: 131 mg/dL — ABNORMAL HIGH (ref 70–99)
Glucose-Capillary: 98 mg/dL (ref 70–99)

## 2019-12-18 LAB — PREALBUMIN: Prealbumin: 5.8 mg/dL — ABNORMAL LOW (ref 18–38)

## 2019-12-18 LAB — TRIGLYCERIDES: Triglycerides: 117 mg/dL (ref <150)

## 2019-12-18 LAB — PHOSPHORUS: Phosphorus: 3.4 mg/dL (ref 2.5–4.6)

## 2019-12-18 MED ORDER — MENTHOL 3 MG MT LOZG: 1.0000 | LOZENGE | OROMUCOSAL | 12 refills | Status: DC | PRN

## 2019-12-18 MED ORDER — PHENOL 1.4 % MT LIQD: 1.0000 | OROMUCOSAL | Status: DC | PRN

## 2019-12-18 MED ORDER — FLUCONAZOLE IN SODIUM CHLORIDE 400-0.9 MG/200ML-% IV SOLN: 400.0000 mg | INTRAVENOUS | 5 refills | Status: DC

## 2019-12-18 MED ORDER — OCTREOTIDE ACETATE 100 MCG/ML IJ SOLN: 100.0000 ug | Freq: Three times a day (TID) | INTRAMUSCULAR | 1 refills | Status: DC

## 2019-12-18 MED ORDER — LORAZEPAM 2 MG/ML IJ SOLN: 1.0000 mg | INTRAMUSCULAR | 0 refills | Status: DC | PRN

## 2019-12-18 MED ORDER — GERHARDT'S BUTT CREAM: 1.0000 "application " | TOPICAL_CREAM | Freq: Three times a day (TID) | CUTANEOUS | 5 refills | Status: DC

## 2019-12-18 MED ORDER — ONDANSETRON 4 MG PO TBDP: 4.0000 mg | ORAL_TABLET | Freq: Four times a day (QID) | ORAL | 0 refills | Status: DC | PRN

## 2019-12-18 MED ORDER — ENOXAPARIN SODIUM 40 MG/0.4ML ~~LOC~~ SOLN: 40.0000 mg | SUBCUTANEOUS | 5 refills | Status: DC

## 2019-12-18 MED ORDER — PHENOL 1.4 % MT LIQD: 1.0000 | OROMUCOSAL | 0 refills | Status: DC | PRN

## 2019-12-18 MED ORDER — HYDROMORPHONE HCL 1 MG/ML IJ SOLN: 1.0000 mg | INTRAMUSCULAR | 0 refills | Status: DC | PRN

## 2019-12-18 MED ORDER — TRAVASOL 10 % IV SOLN
INTRAVENOUS | Status: DC
  Filled 2019-12-18: qty 936

## 2019-12-18 MED ORDER — PANTOPRAZOLE SODIUM 40 MG IV SOLR: 40.0000 mg | Freq: Every day | INTRAVENOUS | 5 refills | Status: DC

## 2019-12-18 MED ORDER — INSULIN ASPART 100 UNIT/ML ~~LOC~~ SOLN: 0.0000 [IU] | Freq: Three times a day (TID) | SUBCUTANEOUS | 11 refills | Status: DC

## 2019-12-18 MED ORDER — HYDROMORPHONE HCL 1 MG/ML IJ SOLN
1.0000 mg | INTRAMUSCULAR | Status: DC | PRN
  Administered 2019-12-18 (×4): 1 mg via INTRAVENOUS
  Filled 2019-12-18 (×4): qty 1

## 2019-12-18 MED ORDER — LOPERAMIDE HCL 1 MG/7.5ML PO SUSP: 4.0000 mg | Freq: Four times a day (QID) | ORAL | 0 refills | Status: DC

## 2019-12-18 MED ORDER — PIPERACILLIN-TAZOBACTAM 3.375 G IVPB: 3.3750 g | Freq: Three times a day (TID) | INTRAVENOUS | 5 refills | Status: DC

## 2019-12-18 MED ORDER — ALBUMIN HUMAN 25 % IV SOLN
50.0000 g | Freq: Once | INTRAVENOUS | Status: AC
  Administered 2019-12-18: 50 g via INTRAVENOUS
  Filled 2019-12-18: qty 200

## 2019-12-18 NOTE — Progress Notes (Signed)
PHARMACY - TOTAL PARENTERAL NUTRITION CONSULT NOTE    Indication: Massive bowel obstruction   Patient Measurements: Height: 5' 1"  (154.9 cm) Weight: 82 kg (180 lb 12.4 oz) IBW/kg (Calculated) : 47.8 TPN AdjBW (KG): 54.6 Body mass index is 34.16 kg/m.   Assessment: Status post right hemicolectomy due to perforated bowel, need for second look operation. Fistula  with high output. Octreotide added 10/23 to help with any decrease in output, appears it has helped.  Glucose / Insulin: 113-127--> 6 units of aspart  given in past 24 hours  Electrolytes:   Calcium: 7.2 Corrected Ca: ~9 mg/dL Na: 135 K: 3.6   Mg: 1.9  Renal:  Scr 0.13m/dL--> creatinine clearance likely over-estimated  LFT: AST: 23   ALT: 18   Alk phos: 89  Total Bili: 0.6  TG:  145>141>117> 108   Prealbumin:  n/a Albumin:  1.5  Intake / Output:    Intake/Output Summary (Last 24 hours) at 12/18/2019 0816 Last data filed at 12/18/2019 0740 Gross per 24 hour  Intake 4058.58 ml  Output 3135 ml  Net 923.58 ml   NG output: 125 ml  in past  24h Drains:  168min in past 24h  MIVF: NS @ 90 mL/hr  Surgeries / Procedures: Exploratory laparotomy, partial small bowel resection with ileostomy, central line placement  Central access: 12/06/19 left subclavian triple-lumen central line TPN start date: 12/07/19  Nutritional Goals (per RD recommendation on 10/14):  Kcal:  129144-4584  kcal Protein:  95g Fluid:  >/= 1.2 L  Goal TPN rate is 60 mL/hr (provides 95 g of protein and 1304 kcals per day.   Current Nutrition:  NPO   10/15: Patient extubated and taken off propofol    Plan:  Continue lipids in  TPN  Electrolytes in TPN:  3517mL of Na, 59m100m of K, 5mEq26mof Ca, 15mEq28mf Mg, and 15mmol56mf Phos.  Cl:Ac ratio  Continue at 1:2 for CO2 of 22 Add standard MVI and trace elements to TPN Continue insulin in TPN to  14 units   Continue moderate  SSI q8h--> adjust as needed  Continue MIVF at 90 mL/hr at  1800 Monitor TPN labs on Mon/Thurs  [name] PIsac Spencer Christina AustriaCl[place]al Pharmacist Pager #[phone] 8:16 AM

## 2019-12-18 NOTE — TOC Transition Note (Signed)
Transition of Care [facility]) - CM/SW Discharge Note  Patient Details  Name: [name] MRN: [id number] Date of Birth: [birth year]  Transition of [facility] CM/SW Contact:  Ewing Schlein, LCSW Phone Number: 12/18/2019, 12:22 PM  Clinical Narrative: CSW updated by Alcario Drought with [facility] that patient's BCBS has approved LTAC. CSW updated husband. Patient will go to room 5E-10 and the admitting physician is Dr. Carron Curie. The number to call for report is [phone]. EMS has been scheduled and medical necessity form completed and provided to RN. TOC signing off.  Final next level of care: Long Term Acute Care (LTAC) [facility]: [address] [place], [place], [place] [address]) Barriers to Discharge: Barriers Resolved  Patient Goals and CMS Choice CMS Medicare.gov Compare Post Acute Care list provided to:: Patient Choice offered to / list presented to : Patient  Discharge Placement Patient to be transferred to facility by: [facility] Name of family member notified: [name] (husband) Patient and family notified of of transfer: 12/18/19  Discharge Plan and Services In-house Referral: Clinical Social Work Discharge Planning Services: NA Post Acute Care Choice: Skilled Nursing Facility          DME Arranged: N/A DME Agency: NA HH Arranged: NA HH Agency: NA  Readmission Risk Interventions No flowsheet data found.

## 2019-12-18 NOTE — Progress Notes (Signed)
Patient discharged with PICC line, NG tube. Report given to [facility] RN. Patient transported via EMS.

## 2019-12-18 NOTE — Progress Notes (Signed)
12 Days Post-Op  Subjective: Patient is alert and oriented.  States the fentanyl is not holding her.  Objective: Vital signs in last 24 hours: Temp:  [98.7 F (37.1 C)-99.4 F (37.4 C)] 98.7 F (37.1 C) (10/25 0400) Pulse Rate:  [84-122] 84 (10/25 0730) Resp:  [20-28] 20 (10/25 0730) BP: (94-128)/(41-72) 114/49 (10/25 0600) SpO2:  [95 %-100 %] 97 % (10/25 0730) FiO2 (%):  [28 %] 28 % (10/24 2300) Weight:  [82 kg] 82 kg (10/25 0600)    Intake/Output from previous day: 10/24 0701 - 10/25 0700 In: 3790.9 [P.O.:60; I.V.:3354.5; IV Piggyback:376.4] Out: 3135 [Urine:3000; Emesis/NG output:125; Drains:10] Intake/Output this shift: No intake/output data recorded.  General appearance: alert, cooperative and no distress Resp: clear to auscultation bilaterally Cardio: regular rate and rhythm, S1, S2 normal, no murmur, click, rub or gallop GI: Soft.  Ileostomy pink and patent.  Dressing change was done prior to my rounding, but nursing reports significant decrease in succus in the wound.  Lab Results:  Recent Labs    12/17/19 0328 12/18/19 0351  WBC 11.5* 12.5*  HGB 8.9* 8.8*  HCT 29.3* 28.6*  PLT 634* 691*   BMET Recent Labs    12/18/19 0351  NA 133*  K 4.1  CL 101  CO2 24  GLUCOSE 121*  BUN 15  CREATININE 0.37*  CALCIUM 7.7*   PT/INR No results for input(s): LABPROT, INR in the last 72 hours.  Studies/Results: No results found.  Anti-infectives: Anti-infectives (From admission, onward)   Start     Dose/Rate Route Frequency Ordered Stop   12/14/19 1215  fluconazole (DIFLUCAN) IVPB 400 mg        400 mg 100 mL/hr over 120 Minutes Intravenous Every 24 hours 12/14/19 1128     12/14/19 1000  fluconazole (DIFLUCAN) IVPB 400 mg  Status:  Discontinued        400 mg 100 mL/hr over 120 Minutes Intravenous Once 12/14/19 0916 12/14/19 1128   12/04/19 2030  piperacillin-tazobactam (ZOSYN) IVPB 3.375 g        3.375 g 12.5 mL/hr over 240 Minutes Intravenous Every 8 hours  12/04/19 2004     12/04/19 1500  ceFEPIme (MAXIPIME) 2 g in sodium chloride 0.9 % 100 mL IVPB        2 g 200 mL/hr over 30 Minutes Intravenous  Once 12/04/19 1455 12/04/19 1602   12/04/19 1500  metroNIDAZOLE (FLAGYL) IVPB 500 mg        500 mg 100 mL/hr over 60 Minutes Intravenous  Once 12/04/19 1455 12/04/19 1635      Assessment/Plan: s/p Procedure(s): EXPLORATORY LAPAROTOMY CENTRAL LINE INSERTION PARTIAL SMALL BOWEL RESECTION WITH ILEOSTOMY Impression: Hopefully the fistulous output has decreased.  Her albumin is still low at 1.5 and will supplement.  We will try Dilaudid instead of fentanyl.  Awaiting placement.  LOS: 14 days    [name] 12/18/2019

## 2019-12-18 NOTE — Discharge Summary (Addendum)
Physician Discharge Summary  Patient ID: [name] MRN: [id number] DOB/AGE: Sep 21, 1960 59 y.o.  Admit date: 12/04/2019 Discharge date: 12/18/2019  Admission Diagnoses: Peritonitis, perforated viscus  Discharge Diagnoses: Same, colon perforation secondary to perforated appendicitis, enterocutaneous fistula, intra-abdominal abscess, blood loss secondary to acute on chronic anemia, hypoalbumin anemia Active Problems:   Colon perforation (HCC)   S/P partial colectomy   Small bowel perforation (HCC)   Discharged Condition: fair  Hospital Course: Patient is a 59 year old white female who presented to the emergency room with a 2-week history of worsening lower abdominal pain.  CT scan of the abdomen revealed pneumoperitoneum with multiple areas of interloop abscesses within the small intestine.  The patient was taken to the operating room on 12/04/2019 and underwent exploratory laparotomy with right hemicolectomy that was left in discontinuity.  A perforation in the cecum of the colon was noted.  The patient was left intubated and transferred to the intensive care unit.  She subsequently returned to the operating room 2 days later and underwent further partial small bowel resection with approximately 200 cm of small intestine left.  An ileostomy was created and retention sutures were placed for fascial closure and dressing changes of the subcutaneous tissue.  Initial deep abdominal cultures grew Klebsiella.  She was continued on Zosyn.  On approximately postoperative day 5 after the second surgery, she started having succus entericus in the midline abdominal wound.  She was continued on NG tube decompression.  TPN had already been started due to her hypoalbuminemia and deconditioned state.  A reculture of her wound reveals E. coli.  She was started empirically on Diflucan as she had been on Zosyn for approximately 10 days.  She has been given packed red blood cells intermittently due to anemia  secondary to acute on chronic blood loss. [facility] [place] [facility] and ongoing wound care.  Patient requires bowel rest with TPN to help with enterocutaneous closure.  She also is requiring frequent pain medication with Dilaudid as she has been having breakthrough pain with fentanyl.  She was started on Dilaudid 1 mg IV every 2 hours to help control her breakthrough pain.  Treatments: surgery: Exploratory laparotomy, right hemicolectomy with bowel left to discontinuity on 12/04/2019 Exploratory laparotomy, partial small bowel resection with formation of ileostomy on 12/06/2019  Discharge Exam: Blood pressure (!) 112/43, pulse 86, temperature 98.7 F (37.1 C), temperature source Oral, resp. rate 15, height 5\' 1"  (1.549 m), weight 82.6 kg, SpO2 95 %. General appearance: alert, cooperative and no distress Resp: clear to auscultation bilaterally Cardio: regular rate and rhythm, S1, S2 normal, no murmur, click, rub or gallop GI: Soft, midline wound healing well by secondary intention.  Less succus drainage noted.  Both JPs with minimal serosanguineous drainage.  Ileostomy pink and patent.  Disposition: Discharge disposition: [facility] Not Defined           Signed: 12/18/2019, 11:46 AM

## 2019-12-19 LAB — BASIC METABOLIC PANEL
Anion gap: 12 (ref 5–15)
BUN: 8 mg/dL (ref 6–20)
CO2: 22 mmol/L (ref 22–32)
Calcium: 8.1 mg/dL — ABNORMAL LOW (ref 8.9–10.3)
Chloride: 101 mmol/L (ref 98–111)
Creatinine, Ser: 0.53 mg/dL (ref 0.44–1.00)
GFR, Estimated: >60 mL/min (ref >60)
Glucose, Bld: 126 mg/dL — ABNORMAL HIGH (ref 70–99)
Potassium: 4.1 mmol/L (ref 3.5–5.1)
Sodium: 135 mmol/L (ref 135–145)

## 2019-12-19 LAB — CBC
HCT: 29.5 % — ABNORMAL LOW (ref 36.0–46.0)
Hemoglobin: 9.1 g/dL — ABNORMAL LOW (ref 12.0–15.0)
MCH: 28.2 pg (ref 26.0–34.0)
MCHC: 30.8 g/dL (ref 30.0–36.0)
MCV: 91.3 fL (ref 80.0–100.0)
Platelets: 628 10*3/uL — ABNORMAL HIGH (ref 150–400)
RBC: 3.23 MIL/uL — ABNORMAL LOW (ref 3.87–5.11)
RDW: 14.1 % (ref 11.5–15.5)
WBC: 12.9 10*3/uL — ABNORMAL HIGH (ref 4.0–10.5)
nRBC: 0 % (ref 0.0–0.2)

## 2019-12-19 LAB — AEROBIC CULTURE W GRAM STAIN (SUPERFICIAL SPECIMEN)
Gram Stain: NONE SEEN
Special Requests: NORMAL

## 2019-12-20 LAB — TRIGLYCERIDES: Triglycerides: 111 mg/dL (ref <150)

## 2019-12-20 LAB — MAGNESIUM: Magnesium: 1.7 mg/dL (ref 1.7–2.4)

## 2019-12-20 LAB — PHOSPHORUS: Phosphorus: 3.7 mg/dL (ref 2.5–4.6)

## 2019-12-22 LAB — CBC
HCT: 32.1 % — ABNORMAL LOW (ref 36.0–46.0)
Hemoglobin: 10.1 g/dL — ABNORMAL LOW (ref 12.0–15.0)
MCH: 28.3 pg (ref 26.0–34.0)
MCHC: 31.5 g/dL (ref 30.0–36.0)
MCV: 89.9 fL (ref 80.0–100.0)
Platelets: 621 10*3/uL — ABNORMAL HIGH (ref 150–400)
RBC: 3.57 MIL/uL — ABNORMAL LOW (ref 3.87–5.11)
RDW: 13.9 % (ref 11.5–15.5)
WBC: 14.7 10*3/uL — ABNORMAL HIGH (ref 4.0–10.5)
nRBC: 0 % (ref 0.0–0.2)

## 2019-12-22 LAB — BASIC METABOLIC PANEL
Anion gap: 9 (ref 5–15)
BUN: 10 mg/dL (ref 6–20)
CO2: 25 mmol/L (ref 22–32)
Calcium: 8.2 mg/dL — ABNORMAL LOW (ref 8.9–10.3)
Chloride: 100 mmol/L (ref 98–111)
Creatinine, Ser: 0.51 mg/dL (ref 0.44–1.00)
GFR, Estimated: >60 mL/min (ref >60)
Glucose, Bld: 147 mg/dL — ABNORMAL HIGH (ref 70–99)
Potassium: 4 mmol/L (ref 3.5–5.1)
Sodium: 134 mmol/L — ABNORMAL LOW (ref 135–145)

## 2019-12-22 LAB — MAGNESIUM: Magnesium: 1.7 mg/dL (ref 1.7–2.4)

## 2019-12-23 LAB — CBC WITH DIFFERENTIAL/PLATELET
Band Neutrophils: 4 %
Basophils Absolute: 0.1 10*3/uL (ref 0.0–0.1)
Basophils Relative: 1 %
Eosinophils Absolute: 0.1 10*3/uL (ref 0.0–0.5)
Eosinophils Relative: 1 %
HCT: 29.3 % — ABNORMAL LOW (ref 36.0–46.0)
Hemoglobin: 8.9 g/dL — ABNORMAL LOW (ref 12.0–15.0)
Lymphocytes Relative: 14 %
Lymphs Abs: 1.6 10*3/uL (ref 0.7–4.0)
MCH: 29.7 pg (ref 26.0–34.0)
MCHC: 30.4 g/dL (ref 30.0–36.0)
MCV: 97.7 fL (ref 80.0–100.0)
Metamyelocytes Relative: 1 %
Monocytes Absolute: 0.6 10*3/uL (ref 0.1–1.0)
Monocytes Relative: 5 %
Neutro Abs: 9 10*3/uL — ABNORMAL HIGH (ref 1.7–7.7)
Neutrophils Relative %: 74 %
Platelets: 634 10*3/uL — ABNORMAL HIGH (ref 150–400)
RBC: 3 MIL/uL — ABNORMAL LOW (ref 3.87–5.11)
RDW: 14.6 % (ref 11.5–15.5)
WBC: 11.5 10*3/uL — ABNORMAL HIGH (ref 4.0–10.5)
nRBC: 0 % (ref 0.0–0.2)

## 2019-12-23 LAB — PHOSPHORUS: Phosphorus: 4.3 mg/dL (ref 2.5–4.6)

## 2019-12-23 LAB — MAGNESIUM: Magnesium: 2.2 mg/dL (ref 1.7–2.4)

## 2019-12-24 LAB — BASIC METABOLIC PANEL
Anion gap: 13 (ref 5–15)
BUN: 16 mg/dL (ref 6–20)
CO2: 24 mmol/L (ref 22–32)
Calcium: 8.5 mg/dL — ABNORMAL LOW (ref 8.9–10.3)
Chloride: 96 mmol/L — ABNORMAL LOW (ref 98–111)
Creatinine, Ser: 0.49 mg/dL (ref 0.44–1.00)
GFR, Estimated: >60 mL/min (ref >60)
Glucose, Bld: 138 mg/dL — ABNORMAL HIGH (ref 70–99)
Potassium: 4.4 mmol/L (ref 3.5–5.1)
Sodium: 133 mmol/L — ABNORMAL LOW (ref 135–145)

## 2019-12-24 LAB — CBC
HCT: 35.6 % — ABNORMAL LOW (ref 36.0–46.0)
Hemoglobin: 10.8 g/dL — ABNORMAL LOW (ref 12.0–15.0)
MCH: 27.7 pg (ref 26.0–34.0)
MCHC: 30.3 g/dL (ref 30.0–36.0)
MCV: 91.3 fL (ref 80.0–100.0)
Platelets: 434 10*3/uL — ABNORMAL HIGH (ref 150–400)
RBC: 3.9 MIL/uL (ref 3.87–5.11)
RDW: 14 % (ref 11.5–15.5)
WBC: 16.3 10*3/uL — ABNORMAL HIGH (ref 4.0–10.5)
nRBC: 0 % (ref 0.0–0.2)

## 2019-12-25 LAB — COMPREHENSIVE METABOLIC PANEL
ALT: 13 U/L (ref 0–44)
AST: 24 U/L (ref 15–41)
Albumin: 1.6 g/dL — ABNORMAL LOW (ref 3.5–5.0)
Alkaline Phosphatase: 114 U/L (ref 38–126)
Anion gap: 12 (ref 5–15)
BUN: 16 mg/dL (ref 6–20)
CO2: 19 mmol/L — ABNORMAL LOW (ref 22–32)
Calcium: 8.2 mg/dL — ABNORMAL LOW (ref 8.9–10.3)
Chloride: 100 mmol/L (ref 98–111)
Creatinine, Ser: 0.43 mg/dL — ABNORMAL LOW (ref 0.44–1.00)
GFR, Estimated: >60 mL/min (ref >60)
Glucose, Bld: 146 mg/dL — ABNORMAL HIGH (ref 70–99)
Potassium: 4.8 mmol/L (ref 3.5–5.1)
Sodium: 131 mmol/L — ABNORMAL LOW (ref 135–145)
Total Bilirubin: 0.9 mg/dL (ref 0.3–1.2)
Total Protein: 7.7 g/dL (ref 6.5–8.1)

## 2019-12-25 LAB — CBC
HCT: 31.3 % — ABNORMAL LOW (ref 36.0–46.0)
Hemoglobin: 9.8 g/dL — ABNORMAL LOW (ref 12.0–15.0)
MCH: 28.5 pg (ref 26.0–34.0)
MCHC: 31.3 g/dL (ref 30.0–36.0)
MCV: 91 fL (ref 80.0–100.0)
Platelets: 439 10*3/uL — ABNORMAL HIGH (ref 150–400)
RBC: 3.44 MIL/uL — ABNORMAL LOW (ref 3.87–5.11)
RDW: 14.1 % (ref 11.5–15.5)
WBC: 15.9 10*3/uL — ABNORMAL HIGH (ref 4.0–10.5)
nRBC: 0 % (ref 0.0–0.2)

## 2019-12-26 LAB — PHOSPHORUS: Phosphorus: 4.4 mg/dL (ref 2.5–4.6)

## 2019-12-26 LAB — MAGNESIUM: Magnesium: 1.9 mg/dL (ref 1.7–2.4)

## 2019-12-27 LAB — TRIGLYCERIDES: Triglycerides: 116 mg/dL (ref <150)

## 2019-12-28 LAB — CBC
HCT: 31.6 % — ABNORMAL LOW (ref 36.0–46.0)
Hemoglobin: 9.7 g/dL — ABNORMAL LOW (ref 12.0–15.0)
MCH: 27.7 pg (ref 26.0–34.0)
MCHC: 30.7 g/dL (ref 30.0–36.0)
MCV: 90.3 fL (ref 80.0–100.0)
Platelets: 442 10*3/uL — ABNORMAL HIGH (ref 150–400)
RBC: 3.5 MIL/uL — ABNORMAL LOW (ref 3.87–5.11)
RDW: 13.9 % (ref 11.5–15.5)
WBC: 11.1 10*3/uL — ABNORMAL HIGH (ref 4.0–10.5)
nRBC: 0 % (ref 0.0–0.2)

## 2019-12-28 LAB — BASIC METABOLIC PANEL
Anion gap: 7 (ref 5–15)
BUN: 15 mg/dL (ref 6–20)
CO2: 25 mmol/L (ref 22–32)
Calcium: 8.3 mg/dL — ABNORMAL LOW (ref 8.9–10.3)
Chloride: 103 mmol/L (ref 98–111)
Creatinine, Ser: 0.53 mg/dL (ref 0.44–1.00)
GFR, Estimated: >60 mL/min (ref >60)
Glucose, Bld: 151 mg/dL — ABNORMAL HIGH (ref 70–99)
Potassium: 4.1 mmol/L (ref 3.5–5.1)
Sodium: 135 mmol/L (ref 135–145)

## 2019-12-29 LAB — MAGNESIUM: Magnesium: 1.8 mg/dL (ref 1.7–2.4)

## 2019-12-29 LAB — PHOSPHORUS: Phosphorus: 3.8 mg/dL (ref 2.5–4.6)

## 2019-12-29 NOTE — Consult Note (Signed)
Infectious Disease Consultation   [name]  HYW:[id number]  DOB: Sep 28, 1960  DOA: 12/18/2019  Requesting physician: Dr. Sharyon Medicus  Reason for consultation: Antibiotic recommendations   History of Present Illness: [name] is an 59 y.o. female who was admitted to [facility] on 12/04/2018 when she presented with worsening abdominal pain, nausea, vomiting, diarrhea for approximately 2 weeks.  CT of the abdomen showed perforated bowel with multiple interloop abscesses within the small intestine time and free air.  She was taken to the OR on 12/04/2019 and underwent exploratory laparotomy with right hemicolectomy that was left in discontinuity.  Perforation of the cecum of the colon was noted.  Postop she was intubated and transferred to the ICU.  She subsequently returned to the operating room 2 days later and underwent further partial small bowel resection approximately 200 cm of small intestine left.  Ileostomy was created, retention sutures were placed for fascial closure and dressing changes of the subcutaneous tissue.  Initial deep abdominal cultures grew Klebsiella.  She was treated with Zosyn.  Approximately postoperative day 5 after the second surgery she started having succus entericus in the midline abdominal wound.  She was continued on NG tube decompression.  TPN was started due to hypoalbuminemia and deconditioned state.  3 culture of her wound showed E. coli.  She was started empirically on Diflucan as she had been on Zosyn for 10 days.  She was given PRBC intermittently due to anemia secondary to acute on chronic blood loss.  Due to her complex medical problems she was [facility].  Patient currently on TPN. She started having low-grade fever with worsening leukocytosis.  Started on Zosyn, fluconazole.  She is complaining of abdominal pain.  She denies having any chest pain, shortness of breath, nausea, vomiting, fevers, chills.  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  Review of Systems:  Review of systems negative except as mentioned above in the HPI.  Past Medical History: Past Medical History:  Diagnosis Date  . Anxiety   . Arthritis   . GERD (gastroesophageal reflux disease)   . Hypertension   . S/P endoscopy    2008: noncritical Schatzki ring s/p dilation, normal esophagus and stomach, 2003: normal esophagus, s/p Maloney dilation, multiple antral erosions consistent with chronic gastritis, no H.pylori,     Past Surgical History: Past Surgical History:  Procedure Laterality Date  . BOWEL RESECTION N/A 12/06/2019   Procedure: PARTIAL SMALL BOWEL RESECTION WITH ILEOSTOMY;  Surgeon: Franky Macho, MD;  Location: [facility];  Service: General;  Laterality: N/A;  . CENTRAL LINE INSERTION N/A 12/06/2019   Procedure: CENTRAL LINE INSERTION;  Surgeon: Franky Macho, MD;  Location: [facility];  Service: General;  Laterality: N/A;  . CESAREAN SECTION    . CHOLECYSTECTOMY    . COLONOSCOPY  10/17/2010   normal   . ESOPHAGOGASTRODUODENOSCOPY  10/17/2010   non-critical Schatzki's ring s/p dilation, patulous EG junction, small hiatal hernia, otherwise normal stomach, first and second portion of duodenum  . ESOPHAGOGASTRODUODENOSCOPY N/A 10/26/2018   Procedure: ESOPHAGOGASTRODUODENOSCOPY (EGD);  Surgeon: Corbin Ade, MD;  Location: [facility];  Service: Endoscopy;  Laterality: N/A;  9:15am  . FOOT SURGERY Right    heel spur  . HAND SURGERY Left    carpal tunnel  . HERNIA REPAIR N/A    approx.    10 or 12 years ago  . KNEE ARTHROSCOPY  WITH MEDIAL MENISECTOMY  01/07/2012   Procedure: KNEE ARTHROSCOPY WITH MEDIAL MENISECTOMY;  Surgeon: Darreld McleanWayne Keeling, MD;  Location: [facility];  Service: Orthopedics;  Laterality: Right;  . LAPAROTOMY N/A 12/04/2019   Procedure: EXPLORATORY LAPAROTOMY;  Surgeon: Franky MachoJenkins, Mark, MD;  Location: [facility];  Service: General;  Laterality: N/A;  . LAPAROTOMY N/A 12/06/2019    Procedure: EXPLORATORY LAPAROTOMY;  Surgeon: Franky MachoJenkins, Mark, MD;  Location: [facility];  Service: General;  Laterality: N/A;  [name]. MALONEY DILATION  10/17/2010   Procedure: Alvy BealMALONEY DILATION;  Surgeon: Corbin Adeobert M Rourk, MD;  Location: [facility];  Service: Endoscopy;  Laterality: N/A;  . Elease HashimotoMALONEY DILATION N/A 10/26/2018   Procedure: Elease HashimotoMALONEY DILATION;  Surgeon: Corbin Adeourk, Robert M, MD;  Location[facility];  Service: Endoscopy;  Laterality: N/A;  . PARTIAL COLECTOMY Right 12/04/2019   Procedure: RIGHT HEMI COLECTOMY WITH TERMINAL ILEUM RESECTION;  Surgeon: Franky MachoJenkins, Mark, MD;  Location: [facility];  Service: General;  Laterality: Right;  . TOTAL KNEE ARTHROPLASTY Right 04/18/2019   Procedure: RIGHT TOTAL KNEE ARTHROPLASTY;  Surgeon: Vickki HearingHarrison, Stanley E, MD;  Location: [facility];  Service: Orthopedics;  Laterality: Right;  . TRIGGER FINGER RELEASE Right 08/20/2016   Procedure: RELEASE A-1 PULLEY RIGHT LONG FINGER;  Surgeon: Vickki HearingHarrison, Stanley E, MD;  Location: [facility];  Service: Orthopedics;  Laterality: Right;   Allergies:   Allergies  Allergen Reactions  . 5-Alpha Reductase Inhibitors   . Maxipime [Cefepime]     Hives    . Metronidazole     hives  . Acetaminophen-Codeine Rash    Tylenol with Codeine Tylenol #3,     Social History:  reports that she has never smoked. She has never used smokeless tobacco. She reports current alcohol use. She reports that she does not use drugs.   Family History: Family History  Problem Relation Age of Onset  . Cancer Father        esophagus ca, living  . Colon cancer Paternal Uncle        in 3160s, early 4070s  . Colon polyps Neg Hx     Physical Exam: Vitals: Temperature 98.3, pulse 91, 22, blood pressure 108/61, pulse oximetry 100% on room air Constitutional:Awake, oriented x3, not in any acute distress. Eyes: PERLA, EOMI, irises appear normal, anicteric sclera,  ENMT: external ears and nose appear normal, normal hearing, Lips appears normal, dry oral mucosa, no ear  or nose lesions   Neck: neck appears normal, normal ROM CVS: S1-S2   Respiratory:  clear to auscultation bilaterally. No accessory muscle use.  Abdomen: Midline abdominal wound, enterocutaneous fistula with drain, ostomy Musculoskeletal: No edema Neuro: She has debility with generalized weakness otherwise grossly nonfocal Psych: stable mood and affect, mental status Skin: no rashes   Data reviewed:  I have personally reviewed following labs and imaging studies Labs:  CBC: Recent Labs  Lab 12/24/19 1531 12/25/19 0631 12/28/19 0433  WBC 16.3* 15.9* 11.1*  HGB 10.8* 9.8* 9.7*  HCT 35.6* 31.3* 31.6*  MCV 91.3 91.0 90.3  PLT 434* 439* 442*    Basic Metabolic Panel: Recent Labs  Lab 12/23/19 1250 12/24/19 1531 12/24/19 1531 12/25/19 0631 12/26/19 0751 12/28/19 0433 12/29/19 0459  NA  --  133*  --  131*  --  135  --   K  --  4.4   < > 4.8  --  4.1  --   CL  --  96*  --  100  --  103  --   CO2  --  24  --  19*  --  25  --   GLUCOSE  --  138*  --  146*  --  151*  --   BUN  --  16  --  16  --  15  --   CREATININE  --  0.49  --  0.43*  --  0.53  --   CALCIUM  --  8.5*  --  8.2*  --  8.3*  --   MG 2.2  --   --   --  1.9  --  1.8  PHOS 4.3  --   --   --  4.4  --  3.8   < > = values in this interval not displayed.   GFR Estimated Creatinine Clearance: 73.8 mL/min (by C-G formula based on SCr of 0.53 mg/dL). Liver Function Tests: Recent Labs  Lab 12/25/19 0631  AST 24  ALT 13  ALKPHOS 114  BILITOT 0.9  PROT 7.7  ALBUMIN 1.6*   No results for input(s): LIPASE, AMYLASE in the last 168 hours. No results for input(s): AMMONIA in the last 168 hours. Coagulation profile No results for input(s): INR, PROTIME in the last 168 hours.  Cardiac Enzymes: No results for input(s): CKTOTAL, CKMB, CKMBINDEX, TROPONINI in the last 168 hours. BNP: Invalid input(s): POCBNP CBG: No results for input(s): GLUCAP in the last 168 hours. D-Dimer No results for input(s): DDIMER in the  last 72 hours. Hgb A1c No results for input(s): HGBA1C in the last 72 hours. Lipid Profile Recent Labs    12/27/19 0439  TRIG 116   Thyroid function studies No results for input(s): TSH, T4TOTAL, T3FREE, THYROIDAB in the last 72 hours.  Invalid input(s): FREET3 Anemia work up No results for input(s): VITAMINB12, FOLATE, FERRITIN, TIBC, IRON, RETICCTPCT in the last 72 hours. Urinalysis    Component Value Date/Time   COLORURINE YELLOW 12/04/2019 2015   APPEARANCEUR CLEAR 12/04/2019 2015   LABSPEC >1.046 (H) 12/04/2019 2015   PHURINE 6.0 12/04/2019 2015   GLUCOSEU NEGATIVE 12/04/2019 2015   HGBUR NEGATIVE 12/04/2019 2015   BILIRUBINUR NEGATIVE 12/04/2019 2015   KETONESUR 5 (A) 12/04/2019 2015   PROTEINUR NEGATIVE 12/04/2019 2015   UROBILINOGEN 0.2 01/05/2012 0854   NITRITE NEGATIVE 12/04/2019 2015   LEUKOCYTESUR NEGATIVE 12/04/2019 2015   Microbiology No results found for this or any previous visit (from the past 240 hour(s)).   Inpatient Medications:   Please see MAR   Radiological Exams on Admission: No results found.  Impression/Recommendations Active Problems: Peritonitis status post bowel perforation Status post exploratory laparotomy with ileostomy Intra-abdominal abscesses/enterocutaneous fistula Leukocytosis Postoperative abdominal wound Protein calorie malnutrition Anemia  Peritonitis status post bowel perforation: Patient reportedly had pneumoperitoneum with multiple areas of interloop abscesses.  She reportedly also had perforation of the cecum.  She is status post exploratory laparotomy with ileostomy.  She received treatment with broad-spectrum antimicrobials at the outside facility.  Intra-abdominal abscesses/enterocutaneous fistula: Patient received treatment with Zosyn at the outside facility.  She unfortunately has postoperative abdominal wound with enterocutaneous fistula.  Surgery consulted for management.  She is currently on TPN.  May take a  long time for the fistula to heal, per surgery unlikely to heal spontaneously.  Continue local wound care.  Leukocytosis: Patient had low-grade fever with leukocytosis.  Therefore started on empiric Zosyn, fluconazole.  Tentative end date 2019-12-29 pending improvement.  However, if her WBC count continues to worsen or if she starts having fevers would recommend to send for pan cultures  and extend antibiotics.  She may also need repeat imaging if this happens. She has allergy listed to cefepime and Flagyl.  Therefore we may be limited in our options for the antibiotics.  Continue to monitor counts.  Protein calorie malnutrition: Patient remains on TPN due to her enterocutaneous fistula.  Further management per the primary team.  Anemia: Continue to monitor hemoglobin.  Transfusion as needed per the primary team.  Due to her complex medical problems she is high risk for worsening and decompensation. Thank you for this consultation.  Plan of care discussed with the patient, primary team and pharmacy.  Vonzella Nipple M.D. 12/29/2019, 3:36 PM

## 2019-12-31 LAB — CBC
HCT: 29 % — ABNORMAL LOW (ref 36.0–46.0)
Hemoglobin: 8.9 g/dL — ABNORMAL LOW (ref 12.0–15.0)
MCH: 27.7 pg (ref 26.0–34.0)
MCHC: 30.7 g/dL (ref 30.0–36.0)
MCV: 90.3 fL (ref 80.0–100.0)
Platelets: 518 10*3/uL — ABNORMAL HIGH (ref 150–400)
RBC: 3.21 MIL/uL — ABNORMAL LOW (ref 3.87–5.11)
RDW: 14 % (ref 11.5–15.5)
WBC: 12 10*3/uL — ABNORMAL HIGH (ref 4.0–10.5)
nRBC: 0 % (ref 0.0–0.2)

## 2019-12-31 LAB — BASIC METABOLIC PANEL
Anion gap: 10 (ref 5–15)
BUN: 13 mg/dL (ref 6–20)
CO2: 25 mmol/L (ref 22–32)
Calcium: 8.3 mg/dL — ABNORMAL LOW (ref 8.9–10.3)
Chloride: 101 mmol/L (ref 98–111)
Creatinine, Ser: 0.45 mg/dL (ref 0.44–1.00)
GFR, Estimated: >60 mL/min (ref >60)
Glucose, Bld: 151 mg/dL — ABNORMAL HIGH (ref 70–99)
Potassium: 4.4 mmol/L (ref 3.5–5.1)
Sodium: 136 mmol/L (ref 135–145)

## 2020-01-01 LAB — PHOSPHORUS: Phosphorus: 4.2 mg/dL (ref 2.5–4.6)

## 2020-01-01 LAB — MAGNESIUM: Magnesium: 1.9 mg/dL (ref 1.7–2.4)

## 2020-01-02 LAB — URINALYSIS, ROUTINE W REFLEX MICROSCOPIC
Bilirubin Urine: NEGATIVE
Glucose, UA: NEGATIVE mg/dL
Hgb urine dipstick: NEGATIVE
Ketones, ur: NEGATIVE mg/dL
Leukocytes,Ua: NEGATIVE
Nitrite: NEGATIVE
Protein, ur: NEGATIVE mg/dL
Specific Gravity, Urine: 1.017 (ref 1.005–1.030)
pH: 6 (ref 5.0–8.0)

## 2020-01-03 LAB — TRIGLYCERIDES: Triglycerides: 136 mg/dL (ref <150)

## 2020-01-04 LAB — MAGNESIUM: Magnesium: 1.8 mg/dL (ref 1.7–2.4)

## 2020-01-04 LAB — PHOSPHORUS: Phosphorus: 4.3 mg/dL (ref 2.5–4.6)

## 2020-01-05 LAB — CBC
HCT: 29.9 % — ABNORMAL LOW (ref 36.0–46.0)
Hemoglobin: 9.2 g/dL — ABNORMAL LOW (ref 12.0–15.0)
MCH: 27.1 pg (ref 26.0–34.0)
MCHC: 30.8 g/dL (ref 30.0–36.0)
MCV: 87.9 fL (ref 80.0–100.0)
Platelets: 485 10*3/uL — ABNORMAL HIGH (ref 150–400)
RBC: 3.4 MIL/uL — ABNORMAL LOW (ref 3.87–5.11)
RDW: 14 % (ref 11.5–15.5)
WBC: 11.1 10*3/uL — ABNORMAL HIGH (ref 4.0–10.5)
nRBC: 0 % (ref 0.0–0.2)

## 2020-01-05 LAB — COMPREHENSIVE METABOLIC PANEL
ALT: 25 U/L (ref 0–44)
AST: 35 U/L (ref 15–41)
Albumin: 1.4 g/dL — ABNORMAL LOW (ref 3.5–5.0)
Alkaline Phosphatase: 139 U/L — ABNORMAL HIGH (ref 38–126)
Anion gap: 9 (ref 5–15)
BUN: 16 mg/dL (ref 6–20)
CO2: 25 mmol/L (ref 22–32)
Calcium: 8.5 mg/dL — ABNORMAL LOW (ref 8.9–10.3)
Chloride: 101 mmol/L (ref 98–111)
Creatinine, Ser: 0.55 mg/dL (ref 0.44–1.00)
GFR, Estimated: >60 mL/min (ref >60)
Glucose, Bld: 132 mg/dL — ABNORMAL HIGH (ref 70–99)
Potassium: 4.1 mmol/L (ref 3.5–5.1)
Sodium: 135 mmol/L (ref 135–145)
Total Bilirubin: 0.4 mg/dL (ref 0.3–1.2)
Total Protein: 8.4 g/dL — ABNORMAL HIGH (ref 6.5–8.1)

## 2020-01-07 ENCOUNTER — Other Ambulatory Visit (HOSPITAL_COMMUNITY): Payer: BC Managed Care – PPO

## 2020-01-07 ENCOUNTER — Encounter: Payer: Self-pay | Admitting: Internal Medicine

## 2020-01-07 LAB — MAGNESIUM: Magnesium: 1.9 mg/dL (ref 1.7–2.4)

## 2020-01-07 LAB — PHOSPHORUS: Phosphorus: 4.5 mg/dL (ref 2.5–4.6)

## 2020-01-07 MED ORDER — IOHEXOL 300 MG/ML  SOLN
100.0000 mL | Freq: Once | INTRAMUSCULAR | Status: AC | PRN
  Administered 2020-01-07: 100 mL via INTRAVENOUS

## 2020-01-08 LAB — URINALYSIS, ROUTINE W REFLEX MICROSCOPIC
Bacteria, UA: NONE SEEN
Bilirubin Urine: NEGATIVE
Glucose, UA: NEGATIVE mg/dL
Hgb urine dipstick: NEGATIVE
Ketones, ur: NEGATIVE mg/dL
Leukocytes,Ua: NEGATIVE
Nitrite: NEGATIVE
Protein, ur: 30 mg/dL — AB
Specific Gravity, Urine: 1.033 — ABNORMAL HIGH (ref 1.005–1.030)
pH: 5 (ref 5.0–8.0)

## 2020-01-09 LAB — COMPREHENSIVE METABOLIC PANEL
ALT: 28 U/L (ref 0–44)
AST: 32 U/L (ref 15–41)
Albumin: 1.3 g/dL — ABNORMAL LOW (ref 3.5–5.0)
Alkaline Phosphatase: 114 U/L (ref 38–126)
Anion gap: 8 (ref 5–15)
BUN: 17 mg/dL (ref 6–20)
CO2: 25 mmol/L (ref 22–32)
Calcium: 8.3 mg/dL — ABNORMAL LOW (ref 8.9–10.3)
Chloride: 103 mmol/L (ref 98–111)
Creatinine, Ser: 0.57 mg/dL (ref 0.44–1.00)
GFR, Estimated: >60 mL/min (ref >60)
Glucose, Bld: 126 mg/dL — ABNORMAL HIGH (ref 70–99)
Potassium: 3.5 mmol/L (ref 3.5–5.1)
Sodium: 136 mmol/L (ref 135–145)
Total Bilirubin: 0.4 mg/dL (ref 0.3–1.2)
Total Protein: 8.5 g/dL — ABNORMAL HIGH (ref 6.5–8.1)

## 2020-01-09 LAB — URINE CULTURE: Culture: NO GROWTH

## 2020-01-09 LAB — CBC
HCT: 28.3 % — ABNORMAL LOW (ref 36.0–46.0)
Hemoglobin: 8.4 g/dL — ABNORMAL LOW (ref 12.0–15.0)
MCH: 26.4 pg (ref 26.0–34.0)
MCHC: 29.7 g/dL — ABNORMAL LOW (ref 30.0–36.0)
MCV: 89 fL (ref 80.0–100.0)
Platelets: 523 10*3/uL — ABNORMAL HIGH (ref 150–400)
RBC: 3.18 MIL/uL — ABNORMAL LOW (ref 3.87–5.11)
RDW: 14.1 % (ref 11.5–15.5)
WBC: 8.7 10*3/uL (ref 4.0–10.5)
nRBC: 0 % (ref 0.0–0.2)

## 2020-01-10 LAB — MAGNESIUM: Magnesium: 1.5 mg/dL — ABNORMAL LOW (ref 1.7–2.4)

## 2020-01-10 LAB — TRIGLYCERIDES: Triglycerides: 156 mg/dL — ABNORMAL HIGH (ref <150)

## 2020-01-10 LAB — PHOSPHORUS: Phosphorus: 3.2 mg/dL (ref 2.5–4.6)

## 2020-01-11 ENCOUNTER — Encounter: Payer: Self-pay | Admitting: Internal Medicine

## 2020-01-11 LAB — MAGNESIUM: Magnesium: 1.9 mg/dL (ref 1.7–2.4)

## 2020-01-11 NOTE — Consult Note (Signed)
Chief Complaint: Patient was seen in consultation today for intra-abdominal fluid collection  Referring Physician(s): Dr. Sharyon Medicus  Supervising Physician: Richarda Overlie  Patient Status: [facility] - Out-pt  History of Present Illness: [name] is a 59 y.o. female with past medical history of HTN, GERD who presented to [facility] with abdominal pain, nausea, vomiting found to have perforated bowel with multiple interloop abscesses and pneumoperitoneum.  Patient underwent emergent ex lap with right hemicolectomy and terminal ileum resection 12/04/19 then returned to the OR 10/13 for further partial small bowel resection with ileostomy.  The fascia was closed and skin was packed between retention sutures with gauze.  She developed green drainage from her midline wound and ultimate enterocutaneous fistula.  She now has a large open EC/midline wound with ongoing brown/dark drainage.  Her surgical drain remains in place in the RLQ.   Past Medical History:  Diagnosis Date  . Anxiety   . Arthritis   . GERD (gastroesophageal reflux disease)   . Hypertension   . S/P endoscopy    2008: noncritical Schatzki ring s/p dilation, normal esophagus and stomach, 2003: normal esophagus, s/p Maloney dilation, multiple antral erosions consistent with chronic gastritis, no H.pylori,     Past Surgical History:  Procedure Laterality Date  . BOWEL RESECTION N/A 12/06/2019   Procedure: PARTIAL SMALL BOWEL RESECTION WITH ILEOSTOMY;  Surgeon: Franky Macho, MD;  Location: [facility];  Service: General;  Laterality: N/A;  . CENTRAL LINE INSERTION N/A 12/06/2019   Procedure: CENTRAL LINE INSERTION;  Surgeon: Franky Macho, MD;  Location: [facility];  Service: General;  Laterality: N/A;  . CESAREAN SECTION    . CHOLECYSTECTOMY    . COLONOSCOPY  10/17/2010   normal   . ESOPHAGOGASTRODUODENOSCOPY  10/17/2010   non-critical Schatzki's ring s/p dilation, patulous EG junction, small hiatal hernia, otherwise normal stomach, first and  second portion of duodenum  . ESOPHAGOGASTRODUODENOSCOPY N/A 10/26/2018   Procedure: ESOPHAGOGASTRODUODENOSCOPY (EGD);  Surgeon: Corbin Ade, MD;  Location: [facility];  Service: Endoscopy;  Laterality: N/A;  9:15am  . FOOT SURGERY Right    heel spur  . HAND SURGERY Left    carpal tunnel  . HERNIA REPAIR N/A    approx.    10 or 12 years ago  . KNEE ARTHROSCOPY WITH MEDIAL MENISECTOMY  01/07/2012   Procedure: KNEE ARTHROSCOPY WITH MEDIAL MENISECTOMY;  Surgeon: Darreld Mclean, MD;  Location: [facility];  Service: Orthopedics;  Laterality: Right;  . LAPAROTOMY N/A 12/04/2019   Procedure: EXPLORATORY LAPAROTOMY;  Surgeon: Franky Macho, MD;  Location: [facility];  Service: General;  Laterality: N/A;  . LAPAROTOMY N/A 12/06/2019   Procedure: EXPLORATORY LAPAROTOMY;  Surgeon: Franky Macho, MD;  Location: [facility];  Service: General;  Laterality: N/A;  [name] DILATION  10/17/2010   Procedure: Alvy Beal;  Surgeon: Corbin Ade, MD;  Location: [facility];  Service: Endoscopy;  Laterality: N/A;  . [name] DILATION N/A 10/26/2018   Procedure: [name] DILATION;  Surgeon: Corbin Ade, MD;  Location: [facility];  Service: Endoscopy;  Laterality: N/A;  . PARTIAL COLECTOMY Right 12/04/2019   Procedure: RIGHT HEMI COLECTOMY WITH TERMINAL ILEUM RESECTION;  Surgeon: Franky Macho, MD;  Location: [facility];  Service: General;  Laterality: Right;  . TOTAL KNEE ARTHROPLASTY Right 04/18/2019   Procedure: RIGHT TOTAL KNEE ARTHROPLASTY;  Surgeon: Vickki Hearing, MD;  Location: [facility];  Service: Orthopedics;  Laterality: Right;  . TRIGGER FINGER RELEASE Right 08/20/2016   Procedure: RELEASE A-1  PULLEY RIGHT LONG FINGER;  Surgeon: Vickki Hearing, MD;  Location: [facility];  Service: Orthopedics;  Laterality: Right;    Allergies: 5-alpha reductase inhibitors, Maxipime [cefepime], Metronidazole, and Acetaminophen-codeine  Medications: Prior to Admission medications   Medication Sig Start Date End  Date Taking? Authorizing Provider  enoxaparin (LOVENOX) 40 MG/0.4ML injection Inject 0.4 mLs (40 mg total) into the skin daily. 12/19/19   Franky Macho, MD  fluconazole (DIFLUCAN) 400-0.9 MG/200ML-% IVPB Inject 200 mLs (400 mg total) into the vein daily. 12/19/19   Franky Macho, MD  HYDROmorphone (DILAUDID) 1 MG/ML injection Inject 1 mL (1 mg total) into the vein every 2 (two) hours as needed for severe pain. 12/18/19   Franky Macho, MD  insulin aspart (NOVOLOG) 100 UNIT/ML injection Inject 0-15 Units into the skin every 8 (eight) hours. 12/18/19   Franky Macho, MD  loperamide HCl (IMODIUM) 1 MG/7.5ML suspension Place 30 mLs (4 mg total) into feeding tube every 6 (six) hours. 12/18/19   Franky Macho, MD  LORazepam (ATIVAN) 2 MG/ML injection Inject 0.5 mLs (1 mg total) into the vein every 4 (four) hours as needed for anxiety. 12/18/19   Franky Macho, MD  menthol-cetylpyridinium (CEPACOL) 3 MG lozenge Take 1 lozenge (3 mg total) by mouth as needed for sore throat. 12/18/19   Franky Macho, MD  Nystatin (GERHARDT'S BUTT CREAM) CREA Apply 1 application topically 3 (three) times daily. 12/18/19   Franky Macho, MD  octreotide (SANDOSTATIN) 100 MCG/ML SOLN injection Inject 1 mL (100 mcg total) into the skin 3 (three) times daily. 12/18/19   Franky Macho, MD  ondansetron (ZOFRAN-ODT) 4 MG disintegrating tablet Take 1 tablet (4 mg total) by mouth every 6 (six) hours as needed for nausea. 12/18/19   Franky Macho, MD  pantoprazole (PROTONIX) 40 MG injection Inject 40 mg into the vein at bedtime. 12/18/19   Franky Macho, MD  phenol (CHLORASEPTIC) 1.4 % LIQD Use as directed 1 spray in the mouth or throat as needed for throat irritation / pain. 12/18/19   Franky Macho, MD  piperacillin-tazobactam (ZOSYN) 3.375 GM/50ML IVPB Inject 50 mLs (3.375 g total) into the vein every 8 (eight) hours. 12/18/19   Franky Macho, MD     Family History  Problem Relation Age of Onset  . Cancer Father        esophagus  ca, living  . Colon cancer Paternal Uncle        in 42s, early 32s  . Colon polyps Neg Hx     Social History   Socioeconomic History  . Marital status: Married    Spouse name: Not on file  . Number of children: Not on file  . Years of education: Not on file  . Highest education level: Not on file  Occupational History  . Occupation: Electrical engineer: [employer]  Tobacco Use  . Smoking status: Never Smoker  . Smokeless tobacco: Never Used  Vaping Use  . Vaping Use: Never used  Substance and Sexual Activity  . Alcohol use: Yes    Comment: drinks gin on weekends   . Drug use: No  . Sexual activity: Yes    Birth control/protection: Post-menopausal  Other Topics Concern  . Not on file  Social History Narrative  . Not on file   Social Determinants of Health   Financial Resource Strain:   . Difficulty of Paying Living Expenses: Not on file  Food Insecurity:   . Worried About Programme researcher, broadcasting/film/video in the  Last Year: Not on file  . Ran Out of Food in the Last Year: Not on file  Transportation Needs:   . Lack of Transportation (Medical): Not on file  . Lack of Transportation (Non-Medical): Not on file  Physical Activity:   . Days of Exercise per Week: Not on file  . Minutes of Exercise per Session: Not on file  Stress:   . Feeling of Stress : Not on file  Social Connections:   . Frequency of Communication with Friends and Family: Not on file  . Frequency of Social Gatherings with Friends and Family: Not on file  . Attends Religious Services: Not on file  . Active Member of Clubs or Organizations: Not on file  . Attends Banker Meetings: Not on file  . Marital Status: Not on file     Review of Systems: A 12 point ROS discussed and pertinent positives are indicated in the HPI above.  All other systems are negative.  Review of Systems  Constitutional: Negative for fatigue and fever.  Respiratory: Negative for cough and shortness of  breath.   Cardiovascular: Negative for chest pain.  Gastrointestinal: Positive for abdominal pain. Negative for diarrhea, nausea and vomiting.  Musculoskeletal: Negative for back pain.  Psychiatric/Behavioral: Negative for behavioral problems and confusion.    Vital Signs: There were no vitals taken for this visit.  Physical Exam Vitals and nursing note reviewed.  Constitutional:      Appearance: Normal appearance.  Cardiovascular:     Rate and Rhythm: Normal rate and regular rhythm.  Pulmonary:     Effort: Pulmonary effort is normal.     Breath sounds: Normal breath sounds.  Abdominal:     Comments: Abdominal midline wound/EC fistula with brown, thick drainage.  Wound is deep with fluid filling cavity.  RLQ drain with feculent/beige/flecked output.   Skin:    General: Skin is warm and dry.  Neurological:     General: No focal deficit present.     Mental Status: She is alert and oriented to person, place, and time. Mental status is at baseline.  Psychiatric:        Mood and Affect: Mood normal.        Behavior: Behavior normal.        Thought Content: Thought content normal.        Judgment: Judgment normal.      MD Evaluation Airway: WNL Heart: WNL Abdomen: WNL Chest/ Lungs: WNL ASA  Classification: 3 Mallampati/Airway Score: One   Imaging: CT ABDOMEN PELVIS W CONTRAST  Result Date: 01/07/2020 CLINICAL DATA:  59 year old female with abdominal pain. EXAM: CT ABDOMEN AND PELVIS WITH CONTRAST TECHNIQUE: Multidetector CT imaging of the abdomen and pelvis was performed using the standard protocol following bolus administration of intravenous contrast. CONTRAST:  OMNIPAQUE IOHEXOL 300 MG/ML  SOLN COMPARISON:  CT abdomen pelvis dated 12/04/2019. FINDINGS: Lower chest: There are bibasilar linear atelectasis/scarring. The visualized lung bases are otherwise clear. Hepatobiliary: The liver is unremarkable. No intrahepatic biliary ductal dilatation. Cholecystectomy. No  retained calcified stone noted in the central CBD. Pancreas: Unremarkable. No pancreatic ductal dilatation or surrounding inflammatory changes. Spleen: Normal in size without focal abnormality. Adrenals/Urinary Tract: The adrenal glands unremarkable. There is no hydronephrosis on either side. There is slight decreased enhancement of the inferior pole of the right kidney on delayed images which may be artifactual or represent pyelonephritis. Correlation with urinalysis recommended. No drainable fluid collection or abscess. The visualized ureters and urinary bladder appear unremarkable.  Stomach/Bowel: There is postsurgical changes of bowel resection with anastomotic suture involving the ascending colon. There is a partially loculated fluid collection with pockets of air in the right lateral abdomen adjacent to the surgical anastomosis concerning for an infected fluid/developing abscess. There is a 1 cm high density focus within this collection (51/3 and 39/6) which may represent a stone. There is no bowel obstruction. There is a right lower quadrant ostomy. Vascular/Lymphatic: The abdominal aorta and IVC are unremarkable. No portal venous gas. There is no adenopathy. Reproductive: The uterus is anteverted and retroflexed. Fluid is noted in the upper vagina. Other: Bilateral drainage catheters with the left drainage catheter along the left lateral peritoneal wall and the right drainage catheter with tip over the pelvis. The drainage catheters do not extend to the collection along the right peritoneum. Musculoskeletal: Midline anterior pelvic wall surgical open incision with fluid collection and air. The fluid collection appears to extend into the peritoneum through a defect in the midline anterior pelvic wall musculature (64/3 and 64/7). No acute osseous pathology. IMPRESSION: 1. Postsurgical changes of bowel resection with anastomotic suture involving the ascending colon. There is a partially loculated fluid  collection with pockets of air in the right lateral abdomen adjacent to the surgical anastomosis concerning for an infected fluid/developing abscess. There is a 1 cm high density focus within this collection which may represent a stone. 2. Midline anterior pelvic wall surgical incision with fluid collection and air. The fluid collection appears to extend into the peritoneum through a defect in the midline anterior pelvic wall musculature. 3. Slight decreased enhancement of the inferior pole of the right kidney on delayed images which may be artifactual or represent pyelonephritis. Correlation with urinalysis recommended. Electronically Signed   By: Elgie Collard M.D.   On: 01/07/2020 17:13   DG Abd Portable 1V  Result Date: 12/18/2019 CLINICAL DATA:  NG tube placement EXAM: PORTABLE ABDOMEN - 1 VIEW COMPARISON:  None. FINDINGS: The enteric tube projects over the gastric antrum/pylorus. The tip is pointed distally. There is a loop of small bowel in the mid abdomen that is dilated measuring up to approximately 5 cm in diameter. Multiple surgical drains are noted. There is no definite pneumatosis or free air. IMPRESSION: 1. Enteric tube projects over the gastric antrum/pylorus. 2. Dilated loop of small bowel in the mid abdomen. Electronically Signed   By: Katherine Mantle M.D.   On: 12/18/2019 23:33    Labs:  CBC: Recent Labs    12/28/19 0433 12/31/19 1048 01/05/20 0557 01/09/20 0536  WBC 11.1* 12.0* 11.1* 8.7  HGB 9.7* 8.9* 9.2* 8.4*  HCT 31.6* 29.0* 29.9* 28.3*  PLT 442* 518* 485* 523*    COAGS: No results for input(s): INR, APTT in the last 8760 hours.  BMP: Recent Labs    04/17/19 0832 04/17/19 0832 04/19/19 0433 12/04/19 1055 12/28/19 0433 12/31/19 1048 01/05/20 0557 01/09/20 0536  NA 138   < > 139   < > 135 136 135 136  K 3.6   < > 4.0   < > 4.1 4.4 4.1 3.5  CL 106   < > 107   < > 103 101 101 103  CO2 25   < > 24   < > 25 25 25 25   GLUCOSE 101*   < > 128*   < > 151*  151* 132* 126*  BUN 17   < > 15   < > 15 13 16 17   CALCIUM 8.7*   < >  8.4*   < > 8.3* 8.3* 8.5* 8.3*  CREATININE 0.54   < > 0.54   < > 0.53 0.45 0.55 0.57  GFRNONAA >60   < > >60   < > >60 >60 >60 >60  GFRAA >60  --  >60  --   --   --   --   --    < > = values in this interval not displayed.    LIVER FUNCTION TESTS: Recent Labs    12/18/19 0351 12/25/19 0631 01/05/20 0557 01/09/20 0536  BILITOT 0.6 0.9 0.4 0.4  AST 23 24 35 32  ALT 18 13 25 28   ALKPHOS 89 114 139* 114  PROT 6.1* 7.7 8.4* 8.5*  ALBUMIN 1.5* 1.6* 1.4* 1.3*    TUMOR MARKERS: No results for input(s): AFPTM, CEA, CA199, CHROMGRNA in the last 8760 hours.  Assessment and Plan: Intra-abdominal fluid collection Patient with complicated perforated bowel 10/11 s/p surgical resection, ileostomy creation.  She developed an EC fistula via her midline wound which is now filled with brown, thick fluid.  CT Abdomen obtained after persistent leakage and abdominal pain.  Reviewed with Dr. Lowella DandyHenn who notes a large open midline wound with active drainage. There is a new right lateral collection which is undrained, although connection to midline cannot be ruled out. Tentative plan made for possible right lateral collection drainage in CT tomorrow (11/19) based on schedule and follow-up with primary providers on [facility], possibly wound care as well.  NPO p MN. Hold lovenox.    Patient alert, oriented.  Daughter at bedside presents for discussion as well.  Reviewed CT results and possible plans.  Explained ongoing decision-making re: next best steps forward particularly related to right lateral fluid collection.   Risks and benefits discussed with the patient including bleeding, infection, damage to adjacent structures, bowel perforation/fistula connection, and sepsis.  All of the patient's questions were answered, patient is agreeable to proceed. Consent signed and in chart.  Thank you for this interesting consult.  I greatly  enjoyed meeting [name] and look forward to participating in their care.  A copy of this report was sent to the requesting provider on this date.  Electronically Signed: Hoyt KochKacie Sue-Ellen Karlin Binion, PA 01/11/2020, 4:56 PM   I spent a total of 40 Minutes    in face to face in clinical consultation, greater than 50% of which was counseling/coordinating care for intra-abdominal fluid collection.

## 2020-01-11 NOTE — Progress Notes (Signed)
PROGRESS NOTE    [name]  UYQ:[id number] DOB: 09/04/1960 DOA: 12/18/2019   Brief Narrative:  [name] is an 59 y.o. female who was admitted to [facility] on 12/04/2018 when she presented with worsening abdominal pain, nausea, vomiting, diarrhea for approximately 2 weeks.  CT of the abdomen showed perforated bowel with multiple interloop abscesses within the small intestine time and free air.  She was taken to the OR on 12/04/2019 and underwent exploratory laparotomy with right hemicolectomy that was left in discontinuity.  Perforation of the cecum of the colon was noted.  Postop she was intubated and transferred to the ICU.  She subsequently returned to the operating room 2 days later and underwent further partial small bowel resection approximately 200 cm of small intestine left.  Ileostomy was created, retention sutures were placed for fascial closure and dressing changes of the subcutaneous tissue.  Initial deep abdominal cultures grew Klebsiella.  She was treated with Zosyn.  Approximately postoperative day 5 after the second surgery she started having succus entericus in the midline abdominal wound.  She was continued on NG tube decompression. TPN was started due to hypoalbuminemia and deconditioned state. Culture of her wound showed E. coli.  She was started empirically on Diflucan as she had been on Zosyn for 10 days.  She was given PRBC intermittently due to anemia secondary to acute on chronic blood loss.  Due to her complex medical problems [facility]  Patient on TPN.  She previously completed treatment with Zosyn and fluconazole.  However, once the antibiotics stopped she started having low-grade fever with worsening leukocytosis.  Repeat CT of the abdomen and pelvis on 01/07/2020 showing loculated fluid collection.  Therefore had to be restarted on Zosyn, fluconazole.     Assessment & Plan: Peritonitis status post bowel perforation Status post  exploratory laparotomy with ileostomy Intra-abdominal abscesses/enterocutaneous fistula Leukocytosis Postoperative abdominal wound Protein calorie malnutrition Anemia  Peritonitis status post bowel perforation: Patient reportedly had pneumoperitoneum with multiple areas of interloop abscesses.  She reportedly also had perforation of the cecum.  She is status post exploratory laparotomy with ileostomy.  She received treatment with broad-spectrum antimicrobials at the outside facility.  Intra-abdominal abscesses/enterocutaneous fistula: Patient received treatment with Zosyn at the outside facility.  She unfortunately has postoperative abdominal wound with enterocutaneous fistula.  Surgery consulted for management.  She is currently on TPN.  May take a long time for the fistula to heal, per surgery unlikely to heal spontaneously.  Continue local wound care.  Leukocytosis: Patient had low-grade fever with leukocytosis.  Therefore she was started on empiric Zosyn, fluconazole.  Once antibiotics stopped she started having fevers with leukocytosis.  CT of the abdomen and pelvis with contrast done on 01/07/2020 per report partially loculated fluid collection with pockets of air in the right lateral abdomen adjacent to the surgical anastomosis concerning for infected fluid/developing abscess.  Therefore restarted on Zosyn and fluconazole.  IR consulted for drainage.  Recommend to send fluid for Gram stain and cultures.  She has allergy listed to cefepime and Flagyl.  Therefore we may be limited in our options for the antibiotics.  Continue to monitor counts.  Protein calorie malnutrition: Patient remains on TPN due to her enterocutaneous fistula.  Further management per the primary team.  Anemia: Continue to monitor hemoglobin.  Transfusion as needed per the primary team.  Due to her complex medical problems she is high risk for worsening and decompensation. Plan of care  discussed with the patient,  primary team and pharmacy.   Subjective: Once antibiotics stopped patient started having fever and leukocytosis.  Recent CT from 01/07/2020 showing loculated fluid collection with pockets of air.  Interventional radiology being consulted.  She is chronically ill, complaining of abdominal discomfort.  Objective: Vitals: Temperature 99.5, blood pressure 93/44, pulse rate 90, respiratory rate 20  Examination: Constitutional:Awake, ill-appearing female Eyes: PERLA, EOMI, irises appear normal, anicteric sclera,  ENMT: external ears and nose appear normal, normal hearing, Lips appears normal, dry oral mucosa, no ear or nose lesions   Neck: neck appears normal, normal ROM CVS: S1-S2   Respiratory:  clear to auscultation bilaterally. No accessory muscle use.  Abdomen: Midline abdominal wound, enterocutaneous fistula with drain, ostomy Musculoskeletal: No edema Neuro: She has debility with generalized weakness otherwise grossly nonfocal Psych: stable mood and affect, mental status Skin: no rashes    Data Reviewed: I have personally reviewed following labs and imaging studies  CBC: Recent Labs  Lab 01/05/20 0557 01/09/20 0536  WBC 11.1* 8.7  HGB 9.2* 8.4*  HCT 29.9* 28.3*  MCV 87.9 89.0  PLT 485* 523*    Basic Metabolic Panel: Recent Labs  Lab 01/05/20 0557 01/07/20 0456 01/09/20 0536 01/10/20 0506 01/11/20 0428  NA 135  --  136  --   --   K 4.1  --  3.5  --   --   CL 101  --  103  --   --   CO2 25  --  25  --   --   GLUCOSE 132*  --  126*  --   --   BUN 16  --  17  --   --   CREATININE 0.55  --  0.57  --   --   CALCIUM 8.5*  --  8.3*  --   --   MG  --  1.9  --  1.5* 1.9  PHOS  --  4.5  --  3.2  --     GFR: CrCl cannot be calculated (Unknown ideal weight.).  Liver Function Tests: Recent Labs  Lab 01/05/20 0557 01/09/20 0536  AST 35 32  ALT 25 28  ALKPHOS 139* 114  BILITOT 0.4 0.4  PROT 8.4* 8.5*  ALBUMIN 1.4* 1.3*    CBG: No results for input(s): GLUCAP  in the last 168 hours.   Recent Results (from the past 240 hour(s))  Culture, Urine     Status: None   Collection Time: 01/08/20  3:00 PM   Specimen: Urine, Random  Result Value Ref Range Status   Specimen Description URINE, RANDOM  Final   Special Requests NONE  Final   Culture   Final    NO GROWTH Performed at [facility], [address], [place], [place] [address]    Report Status 01/09/2020 FINAL  Final     Radiology Studies: No results found.  Scheduled Meds: Please see MAR   Vonzella Nipple, MD  01/11/2020, 4:39 PM

## 2020-01-12 ENCOUNTER — Other Ambulatory Visit (HOSPITAL_COMMUNITY): Payer: BC Managed Care – PPO

## 2020-01-12 MED ORDER — MIDAZOLAM HCL 2 MG/2ML IJ SOLN
INTRAMUSCULAR | Status: AC | PRN
  Administered 2020-01-12 (×3): 0.5 mg via INTRAVENOUS

## 2020-01-12 MED ORDER — FENTANYL CITRATE (PF) 100 MCG/2ML IJ SOLN
INTRAMUSCULAR | Status: AC
  Filled 2020-01-12: qty 2

## 2020-01-12 MED ORDER — MIDAZOLAM HCL 2 MG/2ML IJ SOLN
INTRAMUSCULAR | Status: AC
  Filled 2020-01-12: qty 2

## 2020-01-12 MED ORDER — FENTANYL CITRATE (PF) 100 MCG/2ML IJ SOLN
INTRAMUSCULAR | Status: AC | PRN
Start: 2020-01-12 — End: 2020-01-12
  Administered 2020-01-12: 12.5 ug via INTRAVENOUS
  Administered 2020-01-12 (×2): 25 ug via INTRAVENOUS
  Administered 2020-01-12: 12.5 ug via INTRAVENOUS

## 2020-01-12 NOTE — Procedures (Addendum)
  Interventional Radiology Procedure:   Indications: RLQ abscess with enterocutaneous fistula  Procedure: CT guided drain placement.  Drain removal at end of procedure  Findings: Small fluid collection in RLQ associated with fecalith.  Aspirated 33 ml of purulent fluid.  Placed 10 Fr drain but drain would not coil in collection.  Drain removed at end of case.  Abscess decompressed at end of procedure.   Complications: None     EBL: less than 10 ml  Plan: Send fluid for culture.    Vincen Bejar R. Lowella Dandy, MD  Pager: [phone]

## 2020-01-13 LAB — PHOSPHORUS: Phosphorus: 3.1 mg/dL (ref 2.5–4.6)

## 2020-01-13 LAB — MAGNESIUM: Magnesium: 1.7 mg/dL (ref 1.7–2.4)

## 2020-01-14 LAB — MAGNESIUM: Magnesium: 1.7 mg/dL (ref 1.7–2.4)

## 2020-01-16 LAB — COMPREHENSIVE METABOLIC PANEL
ALT: 15 U/L (ref 0–44)
AST: 27 U/L (ref 15–41)
Albumin: 1.4 g/dL — ABNORMAL LOW (ref 3.5–5.0)
Alkaline Phosphatase: 123 U/L (ref 38–126)
Anion gap: 8 (ref 5–15)
BUN: 11 mg/dL (ref 6–20)
CO2: 24 mmol/L (ref 22–32)
Calcium: 8.4 mg/dL — ABNORMAL LOW (ref 8.9–10.3)
Chloride: 99 mmol/L (ref 98–111)
Creatinine, Ser: 0.53 mg/dL (ref 0.44–1.00)
GFR, Estimated: >60 mL/min (ref >60)
Glucose, Bld: 175 mg/dL — ABNORMAL HIGH (ref 70–99)
Potassium: 4.1 mmol/L (ref 3.5–5.1)
Sodium: 131 mmol/L — ABNORMAL LOW (ref 135–145)
Total Bilirubin: 0.5 mg/dL (ref 0.3–1.2)
Total Protein: 8.5 g/dL — ABNORMAL HIGH (ref 6.5–8.1)

## 2020-01-16 LAB — PHOSPHORUS: Phosphorus: 4.4 mg/dL (ref 2.5–4.6)

## 2020-01-16 LAB — CBC
HCT: 28.6 % — ABNORMAL LOW (ref 36.0–46.0)
Hemoglobin: 8.8 g/dL — ABNORMAL LOW (ref 12.0–15.0)
MCH: 26.8 pg (ref 26.0–34.0)
MCHC: 30.8 g/dL (ref 30.0–36.0)
MCV: 87.2 fL (ref 80.0–100.0)
Platelets: 554 10*3/uL — ABNORMAL HIGH (ref 150–400)
RBC: 3.28 MIL/uL — ABNORMAL LOW (ref 3.87–5.11)
RDW: 15.6 % — ABNORMAL HIGH (ref 11.5–15.5)
WBC: 10.7 10*3/uL — ABNORMAL HIGH (ref 4.0–10.5)
nRBC: 0 % (ref 0.0–0.2)

## 2020-01-16 LAB — MAGNESIUM: Magnesium: 1.9 mg/dL (ref 1.7–2.4)

## 2020-01-17 LAB — AEROBIC/ANAEROBIC CULTURE W GRAM STAIN (SURGICAL/DEEP WOUND)

## 2020-01-17 LAB — TRIGLYCERIDES: Triglycerides: 145 mg/dL (ref <150)

## 2020-01-18 LAB — COMPREHENSIVE METABOLIC PANEL
ALT: 15 U/L (ref 0–44)
AST: 29 U/L (ref 15–41)
Albumin: 1.4 g/dL — ABNORMAL LOW (ref 3.5–5.0)
Alkaline Phosphatase: 112 U/L (ref 38–126)
Anion gap: 11 (ref 5–15)
BUN: 13 mg/dL (ref 6–20)
CO2: 22 mmol/L (ref 22–32)
Calcium: 8.3 mg/dL — ABNORMAL LOW (ref 8.9–10.3)
Chloride: 98 mmol/L (ref 98–111)
Creatinine, Ser: 0.44 mg/dL (ref 0.44–1.00)
GFR, Estimated: >60 mL/min (ref >60)
Glucose, Bld: 145 mg/dL — ABNORMAL HIGH (ref 70–99)
Potassium: 4.2 mmol/L (ref 3.5–5.1)
Sodium: 131 mmol/L — ABNORMAL LOW (ref 135–145)
Total Bilirubin: 0.6 mg/dL (ref 0.3–1.2)
Total Protein: 8.4 g/dL — ABNORMAL HIGH (ref 6.5–8.1)

## 2020-01-18 LAB — CBC
HCT: 28 % — ABNORMAL LOW (ref 36.0–46.0)
Hemoglobin: 8.6 g/dL — ABNORMAL LOW (ref 12.0–15.0)
MCH: 27 pg (ref 26.0–34.0)
MCHC: 30.7 g/dL (ref 30.0–36.0)
MCV: 88.1 fL (ref 80.0–100.0)
Platelets: 460 10*3/uL — ABNORMAL HIGH (ref 150–400)
RBC: 3.18 MIL/uL — ABNORMAL LOW (ref 3.87–5.11)
RDW: 16.2 % — ABNORMAL HIGH (ref 11.5–15.5)
WBC: 10.5 10*3/uL (ref 4.0–10.5)
nRBC: 0 % (ref 0.0–0.2)

## 2020-01-19 LAB — PHOSPHORUS: Phosphorus: 4.5 mg/dL (ref 2.5–4.6)

## 2020-01-19 LAB — MAGNESIUM: Magnesium: 1.9 mg/dL (ref 1.7–2.4)

## 2020-01-20 LAB — BASIC METABOLIC PANEL
Anion gap: 9 (ref 5–15)
BUN: 12 mg/dL (ref 6–20)
CO2: 24 mmol/L (ref 22–32)
Calcium: 8.5 mg/dL — ABNORMAL LOW (ref 8.9–10.3)
Chloride: 96 mmol/L — ABNORMAL LOW (ref 98–111)
Creatinine, Ser: 0.52 mg/dL (ref 0.44–1.00)
GFR, Estimated: >60 mL/min (ref >60)
Glucose, Bld: 139 mg/dL — ABNORMAL HIGH (ref 70–99)
Potassium: 4.1 mmol/L (ref 3.5–5.1)
Sodium: 129 mmol/L — ABNORMAL LOW (ref 135–145)

## 2020-01-23 LAB — BASIC METABOLIC PANEL
Anion gap: 5 (ref 5–15)
BUN: 9 mg/dL (ref 6–20)
CO2: 24 mmol/L (ref 22–32)
Calcium: 7.7 mg/dL — ABNORMAL LOW (ref 8.9–10.3)
Chloride: 98 mmol/L (ref 98–111)
Creatinine, Ser: 0.46 mg/dL (ref 0.44–1.00)
GFR, Estimated: >60 mL/min (ref >60)
Glucose, Bld: 150 mg/dL — ABNORMAL HIGH (ref 70–99)
Potassium: 3.2 mmol/L — ABNORMAL LOW (ref 3.5–5.1)
Sodium: 127 mmol/L — ABNORMAL LOW (ref 135–145)

## 2020-01-23 LAB — CBC
HCT: 27.9 % — ABNORMAL LOW (ref 36.0–46.0)
Hemoglobin: 8.3 g/dL — ABNORMAL LOW (ref 12.0–15.0)
MCH: 26.3 pg (ref 26.0–34.0)
MCHC: 29.7 g/dL — ABNORMAL LOW (ref 30.0–36.0)
MCV: 88.6 fL (ref 80.0–100.0)
Platelets: 426 10*3/uL — ABNORMAL HIGH (ref 150–400)
RBC: 3.15 MIL/uL — ABNORMAL LOW (ref 3.87–5.11)
RDW: 16.1 % — ABNORMAL HIGH (ref 11.5–15.5)
WBC: 7.1 10*3/uL (ref 4.0–10.5)
nRBC: 0 % (ref 0.0–0.2)

## 2020-01-23 LAB — MAGNESIUM: Magnesium: 1.7 mg/dL (ref 1.7–2.4)

## 2020-01-24 LAB — BASIC METABOLIC PANEL
Anion gap: 8 (ref 5–15)
BUN: 9 mg/dL (ref 6–20)
CO2: 23 mmol/L (ref 22–32)
Calcium: 7.5 mg/dL — ABNORMAL LOW (ref 8.9–10.3)
Chloride: 106 mmol/L (ref 98–111)
Creatinine, Ser: 0.41 mg/dL — ABNORMAL LOW (ref 0.44–1.00)
GFR, Estimated: >60 mL/min (ref >60)
Glucose, Bld: 147 mg/dL — ABNORMAL HIGH (ref 70–99)
Potassium: 3.7 mmol/L (ref 3.5–5.1)
Sodium: 137 mmol/L (ref 135–145)

## 2020-01-24 LAB — CBC
HCT: 29.3 % — ABNORMAL LOW (ref 36.0–46.0)
Hemoglobin: 9 g/dL — ABNORMAL LOW (ref 12.0–15.0)
MCH: 26.8 pg (ref 26.0–34.0)
MCHC: 30.7 g/dL (ref 30.0–36.0)
MCV: 87.2 fL (ref 80.0–100.0)
Platelets: 456 10*3/uL — ABNORMAL HIGH (ref 150–400)
RBC: 3.36 MIL/uL — ABNORMAL LOW (ref 3.87–5.11)
RDW: 16.2 % — ABNORMAL HIGH (ref 11.5–15.5)
WBC: 6.3 10*3/uL (ref 4.0–10.5)
nRBC: 0 % (ref 0.0–0.2)

## 2020-01-24 LAB — TRIGLYCERIDES: Triglycerides: 132 mg/dL (ref <150)

## 2020-01-25 LAB — BASIC METABOLIC PANEL
Anion gap: 11 (ref 5–15)
BUN: 10 mg/dL (ref 6–20)
CO2: 24 mmol/L (ref 22–32)
Calcium: 8.3 mg/dL — ABNORMAL LOW (ref 8.9–10.3)
Chloride: 102 mmol/L (ref 98–111)
Creatinine, Ser: 0.37 mg/dL — ABNORMAL LOW (ref 0.44–1.00)
GFR, Estimated: >60 mL/min (ref >60)
Glucose, Bld: 135 mg/dL — ABNORMAL HIGH (ref 70–99)
Potassium: 3.7 mmol/L (ref 3.5–5.1)
Sodium: 137 mmol/L (ref 135–145)

## 2020-01-26 LAB — BASIC METABOLIC PANEL
Anion gap: 9 (ref 5–15)
BUN: 9 mg/dL (ref 6–20)
CO2: 25 mmol/L (ref 22–32)
Calcium: 7.9 mg/dL — ABNORMAL LOW (ref 8.9–10.3)
Chloride: 98 mmol/L (ref 98–111)
Creatinine, Ser: 0.43 mg/dL — ABNORMAL LOW (ref 0.44–1.00)
GFR, Estimated: >60 mL/min (ref >60)
Glucose, Bld: 148 mg/dL — ABNORMAL HIGH (ref 70–99)
Potassium: 4.1 mmol/L (ref 3.5–5.1)
Sodium: 132 mmol/L — ABNORMAL LOW (ref 135–145)

## 2020-01-29 LAB — CBC
HCT: 25.8 % — ABNORMAL LOW (ref 36.0–46.0)
Hemoglobin: 8 g/dL — ABNORMAL LOW (ref 12.0–15.0)
MCH: 26.7 pg (ref 26.0–34.0)
MCHC: 31 g/dL (ref 30.0–36.0)
MCV: 86 fL (ref 80.0–100.0)
Platelets: 360 10*3/uL (ref 150–400)
RBC: 3 MIL/uL — ABNORMAL LOW (ref 3.87–5.11)
RDW: 15.9 % — ABNORMAL HIGH (ref 11.5–15.5)
WBC: 5.4 10*3/uL (ref 4.0–10.5)
nRBC: 0 % (ref 0.0–0.2)

## 2020-01-29 LAB — COMPREHENSIVE METABOLIC PANEL
ALT: 15 U/L (ref 0–44)
AST: 25 U/L (ref 15–41)
Albumin: 1.2 g/dL — ABNORMAL LOW (ref 3.5–5.0)
Alkaline Phosphatase: 98 U/L (ref 38–126)
Anion gap: 7 (ref 5–15)
BUN: 8 mg/dL (ref 6–20)
CO2: 26 mmol/L (ref 22–32)
Calcium: 7.9 mg/dL — ABNORMAL LOW (ref 8.9–10.3)
Chloride: 102 mmol/L (ref 98–111)
Creatinine, Ser: 0.44 mg/dL (ref 0.44–1.00)
GFR, Estimated: >60 mL/min (ref >60)
Glucose, Bld: 146 mg/dL — ABNORMAL HIGH (ref 70–99)
Potassium: 3.9 mmol/L (ref 3.5–5.1)
Sodium: 135 mmol/L (ref 135–145)
Total Bilirubin: 0.3 mg/dL (ref 0.3–1.2)
Total Protein: 7 g/dL (ref 6.5–8.1)

## 2020-01-31 LAB — COMPREHENSIVE METABOLIC PANEL
ALT: 17 U/L (ref 0–44)
AST: 33 U/L (ref 15–41)
Albumin: 1.2 g/dL — ABNORMAL LOW (ref 3.5–5.0)
Alkaline Phosphatase: 161 U/L — ABNORMAL HIGH (ref 38–126)
Anion gap: 8 (ref 5–15)
BUN: 9 mg/dL (ref 6–20)
CO2: 25 mmol/L (ref 22–32)
Calcium: 7.6 mg/dL — ABNORMAL LOW (ref 8.9–10.3)
Chloride: 101 mmol/L (ref 98–111)
Creatinine, Ser: 0.49 mg/dL (ref 0.44–1.00)
GFR, Estimated: >60 mL/min (ref >60)
Glucose, Bld: 149 mg/dL — ABNORMAL HIGH (ref 70–99)
Potassium: 3.5 mmol/L (ref 3.5–5.1)
Sodium: 134 mmol/L — ABNORMAL LOW (ref 135–145)
Total Bilirubin: 0.4 mg/dL (ref 0.3–1.2)
Total Protein: 6.8 g/dL (ref 6.5–8.1)

## 2020-01-31 LAB — TRIGLYCERIDES: Triglycerides: 91 mg/dL (ref <150)

## 2020-02-01 ENCOUNTER — Other Ambulatory Visit (HOSPITAL_COMMUNITY): Payer: BC Managed Care – PPO

## 2020-02-02 LAB — BASIC METABOLIC PANEL
Anion gap: 9 (ref 5–15)
BUN: 7 mg/dL (ref 6–20)
CO2: 26 mmol/L (ref 22–32)
Calcium: 8 mg/dL — ABNORMAL LOW (ref 8.9–10.3)
Chloride: 100 mmol/L (ref 98–111)
Creatinine, Ser: 0.4 mg/dL — ABNORMAL LOW (ref 0.44–1.00)
GFR, Estimated: >60 mL/min (ref >60)
Glucose, Bld: 118 mg/dL — ABNORMAL HIGH (ref 70–99)
Potassium: 3.8 mmol/L (ref 3.5–5.1)
Sodium: 135 mmol/L (ref 135–145)

## 2020-02-02 LAB — MAGNESIUM: Magnesium: 1.7 mg/dL (ref 1.7–2.4)

## 2020-02-02 LAB — PHOSPHORUS: Phosphorus: 4.2 mg/dL (ref 2.5–4.6)

## 2020-02-03 LAB — CBC
HCT: 26.5 % — ABNORMAL LOW (ref 36.0–46.0)
Hemoglobin: 8.2 g/dL — ABNORMAL LOW (ref 12.0–15.0)
MCH: 26.7 pg (ref 26.0–34.0)
MCHC: 30.9 g/dL (ref 30.0–36.0)
MCV: 86.3 fL (ref 80.0–100.0)
Platelets: 380 10*3/uL (ref 150–400)
RBC: 3.07 MIL/uL — ABNORMAL LOW (ref 3.87–5.11)
RDW: 15.8 % — ABNORMAL HIGH (ref 11.5–15.5)
WBC: 5.8 10*3/uL (ref 4.0–10.5)
nRBC: 0 % (ref 0.0–0.2)

## 2020-02-03 LAB — COMPREHENSIVE METABOLIC PANEL
ALT: 16 U/L (ref 0–44)
AST: 23 U/L (ref 15–41)
Albumin: 1.2 g/dL — ABNORMAL LOW (ref 3.5–5.0)
Alkaline Phosphatase: 123 U/L (ref 38–126)
Anion gap: 6 (ref 5–15)
BUN: 12 mg/dL (ref 6–20)
CO2: 25 mmol/L (ref 22–32)
Calcium: 7.5 mg/dL — ABNORMAL LOW (ref 8.9–10.3)
Chloride: 103 mmol/L (ref 98–111)
Creatinine, Ser: 0.38 mg/dL — ABNORMAL LOW (ref 0.44–1.00)
GFR, Estimated: >60 mL/min (ref >60)
Glucose, Bld: 123 mg/dL — ABNORMAL HIGH (ref 70–99)
Potassium: 3.9 mmol/L (ref 3.5–5.1)
Sodium: 134 mmol/L — ABNORMAL LOW (ref 135–145)
Total Bilirubin: 0.1 mg/dL — ABNORMAL LOW (ref 0.3–1.2)
Total Protein: 7 g/dL (ref 6.5–8.1)

## 2020-02-03 LAB — MAGNESIUM: Magnesium: 1.9 mg/dL (ref 1.7–2.4)

## 2020-02-05 LAB — PHOSPHORUS: Phosphorus: 2.3 mg/dL — ABNORMAL LOW (ref 2.5–4.6)

## 2020-02-05 LAB — BASIC METABOLIC PANEL
Anion gap: 7 (ref 5–15)
BUN: 15 mg/dL (ref 6–20)
CO2: 20 mmol/L — ABNORMAL LOW (ref 22–32)
Calcium: 7.3 mg/dL — ABNORMAL LOW (ref 8.9–10.3)
Chloride: 104 mmol/L (ref 98–111)
Creatinine, Ser: 0.39 mg/dL — ABNORMAL LOW (ref 0.44–1.00)
GFR, Estimated: >60 mL/min (ref >60)
Glucose, Bld: 109 mg/dL — ABNORMAL HIGH (ref 70–99)
Potassium: 3.6 mmol/L (ref 3.5–5.1)
Sodium: 131 mmol/L — ABNORMAL LOW (ref 135–145)

## 2020-02-05 LAB — MAGNESIUM: Magnesium: 1.5 mg/dL — ABNORMAL LOW (ref 1.7–2.4)

## 2020-02-06 ENCOUNTER — Other Ambulatory Visit (HOSPITAL_COMMUNITY): Payer: BC Managed Care – PPO

## 2020-02-06 LAB — BASIC METABOLIC PANEL
Anion gap: 12 (ref 5–15)
BUN: 15 mg/dL (ref 6–20)
CO2: 21 mmol/L — ABNORMAL LOW (ref 22–32)
Calcium: 8 mg/dL — ABNORMAL LOW (ref 8.9–10.3)
Chloride: 102 mmol/L (ref 98–111)
Creatinine, Ser: 0.43 mg/dL — ABNORMAL LOW (ref 0.44–1.00)
GFR, Estimated: >60 mL/min (ref >60)
Glucose, Bld: 126 mg/dL — ABNORMAL HIGH (ref 70–99)
Potassium: 3.2 mmol/L — ABNORMAL LOW (ref 3.5–5.1)
Sodium: 135 mmol/L (ref 135–145)

## 2020-02-06 LAB — MAGNESIUM: Magnesium: 1.9 mg/dL (ref 1.7–2.4)

## 2020-02-06 MED ORDER — IOHEXOL 300 MG/ML  SOLN
100.0000 mL | Freq: Once | INTRAMUSCULAR | Status: AC | PRN
  Administered 2020-02-06: 100 mL via INTRAVENOUS

## 2020-02-07 LAB — BASIC METABOLIC PANEL
Anion gap: 7 (ref 5–15)
BUN: 16 mg/dL (ref 6–20)
CO2: 22 mmol/L (ref 22–32)
Calcium: 7.5 mg/dL — ABNORMAL LOW (ref 8.9–10.3)
Chloride: 105 mmol/L (ref 98–111)
Creatinine, Ser: 0.45 mg/dL (ref 0.44–1.00)
GFR, Estimated: >60 mL/min (ref >60)
Glucose, Bld: 120 mg/dL — ABNORMAL HIGH (ref 70–99)
Potassium: 3.4 mmol/L — ABNORMAL LOW (ref 3.5–5.1)
Sodium: 134 mmol/L — ABNORMAL LOW (ref 135–145)

## 2020-02-07 LAB — TRIGLYCERIDES: Triglycerides: 130 mg/dL (ref <150)

## 2020-02-08 LAB — BASIC METABOLIC PANEL
Anion gap: 7 (ref 5–15)
BUN: 16 mg/dL (ref 6–20)
CO2: 21 mmol/L — ABNORMAL LOW (ref 22–32)
Calcium: 7.8 mg/dL — ABNORMAL LOW (ref 8.9–10.3)
Chloride: 105 mmol/L (ref 98–111)
Creatinine, Ser: 0.33 mg/dL — ABNORMAL LOW (ref 0.44–1.00)
GFR, Estimated: >60 mL/min (ref >60)
Glucose, Bld: 125 mg/dL — ABNORMAL HIGH (ref 70–99)
Potassium: 3.7 mmol/L (ref 3.5–5.1)
Sodium: 133 mmol/L — ABNORMAL LOW (ref 135–145)

## 2020-02-08 LAB — MAGNESIUM: Magnesium: 1.7 mg/dL (ref 1.7–2.4)

## 2020-02-08 LAB — PHOSPHORUS: Phosphorus: 3 mg/dL (ref 2.5–4.6)

## 2020-02-09 LAB — BASIC METABOLIC PANEL
Anion gap: 9 (ref 5–15)
BUN: 17 mg/dL (ref 6–20)
CO2: 22 mmol/L (ref 22–32)
Calcium: 7.9 mg/dL — ABNORMAL LOW (ref 8.9–10.3)
Chloride: 103 mmol/L (ref 98–111)
Creatinine, Ser: 0.38 mg/dL — ABNORMAL LOW (ref 0.44–1.00)
GFR, Estimated: >60 mL/min (ref >60)
Glucose, Bld: 112 mg/dL — ABNORMAL HIGH (ref 70–99)
Potassium: 3.8 mmol/L (ref 3.5–5.1)
Sodium: 134 mmol/L — ABNORMAL LOW (ref 135–145)

## 2020-02-10 LAB — BASIC METABOLIC PANEL
Anion gap: 5 (ref 5–15)
BUN: 17 mg/dL (ref 6–20)
CO2: 22 mmol/L (ref 22–32)
Calcium: 7.6 mg/dL — ABNORMAL LOW (ref 8.9–10.3)
Chloride: 106 mmol/L (ref 98–111)
Creatinine, Ser: 0.34 mg/dL — ABNORMAL LOW (ref 0.44–1.00)
GFR, Estimated: >60 mL/min (ref >60)
Glucose, Bld: 125 mg/dL — ABNORMAL HIGH (ref 70–99)
Potassium: 3.8 mmol/L (ref 3.5–5.1)
Sodium: 133 mmol/L — ABNORMAL LOW (ref 135–145)

## 2020-02-10 LAB — MAGNESIUM: Magnesium: 1.9 mg/dL (ref 1.7–2.4)

## 2020-02-11 LAB — BASIC METABOLIC PANEL
Anion gap: 9 (ref 5–15)
BUN: 18 mg/dL (ref 6–20)
CO2: 22 mmol/L (ref 22–32)
Calcium: 7.9 mg/dL — ABNORMAL LOW (ref 8.9–10.3)
Chloride: 103 mmol/L (ref 98–111)
Creatinine, Ser: 0.33 mg/dL — ABNORMAL LOW (ref 0.44–1.00)
GFR, Estimated: >60 mL/min (ref >60)
Glucose, Bld: 118 mg/dL — ABNORMAL HIGH (ref 70–99)
Potassium: 4 mmol/L (ref 3.5–5.1)
Sodium: 134 mmol/L — ABNORMAL LOW (ref 135–145)

## 2020-02-11 LAB — PHOSPHORUS: Phosphorus: 3.4 mg/dL (ref 2.5–4.6)

## 2020-02-11 LAB — MAGNESIUM: Magnesium: 2 mg/dL (ref 1.7–2.4)

## 2020-02-12 LAB — BASIC METABOLIC PANEL
Anion gap: 8 (ref 5–15)
BUN: 17 mg/dL (ref 6–20)
CO2: 22 mmol/L (ref 22–32)
Calcium: 8.1 mg/dL — ABNORMAL LOW (ref 8.9–10.3)
Chloride: 105 mmol/L (ref 98–111)
Creatinine, Ser: 0.4 mg/dL — ABNORMAL LOW (ref 0.44–1.00)
GFR, Estimated: >60 mL/min (ref >60)
Glucose, Bld: 129 mg/dL — ABNORMAL HIGH (ref 70–99)
Potassium: 3.9 mmol/L (ref 3.5–5.1)
Sodium: 135 mmol/L (ref 135–145)

## 2020-02-13 LAB — MAGNESIUM: Magnesium: 1.9 mg/dL (ref 1.7–2.4)

## 2020-02-14 LAB — PHOSPHORUS: Phosphorus: 3.4 mg/dL (ref 2.5–4.6)

## 2020-02-14 LAB — COMPREHENSIVE METABOLIC PANEL
ALT: 28 U/L (ref 0–44)
AST: 32 U/L (ref 15–41)
Albumin: 1.5 g/dL — ABNORMAL LOW (ref 3.5–5.0)
Alkaline Phosphatase: 183 U/L — ABNORMAL HIGH (ref 38–126)
Anion gap: 6 (ref 5–15)
BUN: 16 mg/dL (ref 6–20)
CO2: 23 mmol/L (ref 22–32)
Calcium: 7.8 mg/dL — ABNORMAL LOW (ref 8.9–10.3)
Chloride: 104 mmol/L (ref 98–111)
Creatinine, Ser: 0.33 mg/dL — ABNORMAL LOW (ref 0.44–1.00)
GFR, Estimated: >60 mL/min (ref >60)
Glucose, Bld: 111 mg/dL — ABNORMAL HIGH (ref 70–99)
Potassium: 3.8 mmol/L (ref 3.5–5.1)
Sodium: 133 mmol/L — ABNORMAL LOW (ref 135–145)
Total Bilirubin: 0.4 mg/dL (ref 0.3–1.2)
Total Protein: 7.4 g/dL (ref 6.5–8.1)

## 2020-02-14 LAB — TRIGLYCERIDES: Triglycerides: 158 mg/dL — ABNORMAL HIGH (ref <150)

## 2020-02-14 LAB — MAGNESIUM: Magnesium: 1.8 mg/dL (ref 1.7–2.4)

## 2020-02-17 LAB — MAGNESIUM: Magnesium: 1.8 mg/dL (ref 1.7–2.4)

## 2020-02-17 LAB — PHOSPHORUS: Phosphorus: 3.8 mg/dL (ref 2.5–4.6)

## 2020-02-19 LAB — CBC
HCT: 30.4 % — ABNORMAL LOW (ref 36.0–46.0)
Hemoglobin: 9.7 g/dL — ABNORMAL LOW (ref 12.0–15.0)
MCH: 27.5 pg (ref 26.0–34.0)
MCHC: 31.9 g/dL (ref 30.0–36.0)
MCV: 86.1 fL (ref 80.0–100.0)
Platelets: 314 10*3/uL (ref 150–400)
RBC: 3.53 MIL/uL — ABNORMAL LOW (ref 3.87–5.11)
RDW: 16.7 % — ABNORMAL HIGH (ref 11.5–15.5)
WBC: 6.8 10*3/uL (ref 4.0–10.5)
nRBC: 0 % (ref 0.0–0.2)

## 2020-02-19 LAB — COMPREHENSIVE METABOLIC PANEL
ALT: 30 U/L (ref 0–44)
AST: 28 U/L (ref 15–41)
Albumin: 1.7 g/dL — ABNORMAL LOW (ref 3.5–5.0)
Alkaline Phosphatase: 191 U/L — ABNORMAL HIGH (ref 38–126)
Anion gap: 8 (ref 5–15)
BUN: 18 mg/dL (ref 6–20)
CO2: 21 mmol/L — ABNORMAL LOW (ref 22–32)
Calcium: 8.5 mg/dL — ABNORMAL LOW (ref 8.9–10.3)
Chloride: 107 mmol/L (ref 98–111)
Creatinine, Ser: 0.43 mg/dL — ABNORMAL LOW (ref 0.44–1.00)
GFR, Estimated: >60 mL/min (ref >60)
Glucose, Bld: 148 mg/dL — ABNORMAL HIGH (ref 70–99)
Potassium: 4.4 mmol/L (ref 3.5–5.1)
Sodium: 136 mmol/L (ref 135–145)
Total Bilirubin: 0.3 mg/dL (ref 0.3–1.2)
Total Protein: 7.7 g/dL (ref 6.5–8.1)

## 2020-02-20 LAB — PHOSPHORUS: Phosphorus: 2.8 mg/dL (ref 2.5–4.6)

## 2020-02-20 LAB — MAGNESIUM: Magnesium: 2 mg/dL (ref 1.7–2.4)

## 2020-02-21 LAB — TRIGLYCERIDES: Triglycerides: 220 mg/dL — ABNORMAL HIGH (ref <150)

## 2020-02-23 LAB — MAGNESIUM: Magnesium: 1.8 mg/dL (ref 1.7–2.4)

## 2020-02-23 LAB — PHOSPHORUS: Phosphorus: 2.9 mg/dL (ref 2.5–4.6)

## 2020-02-26 LAB — PHOSPHORUS: Phosphorus: 3.8 mg/dL (ref 2.5–4.6)

## 2020-02-26 LAB — MAGNESIUM: Magnesium: 1.8 mg/dL (ref 1.7–2.4)

## 2020-02-27 LAB — COMPREHENSIVE METABOLIC PANEL
ALT: 39 U/L (ref 0–44)
AST: 33 U/L (ref 15–41)
Albumin: 2.2 g/dL — ABNORMAL LOW (ref 3.5–5.0)
Alkaline Phosphatase: 277 U/L — ABNORMAL HIGH (ref 38–126)
Anion gap: 8 (ref 5–15)
BUN: 20 mg/dL (ref 6–20)
CO2: 21 mmol/L — ABNORMAL LOW (ref 22–32)
Calcium: 8.5 mg/dL — ABNORMAL LOW (ref 8.9–10.3)
Chloride: 104 mmol/L (ref 98–111)
Creatinine, Ser: 0.41 mg/dL — ABNORMAL LOW (ref 0.44–1.00)
GFR, Estimated: >60 mL/min (ref >60)
Glucose, Bld: 118 mg/dL — ABNORMAL HIGH (ref 70–99)
Potassium: 3.7 mmol/L (ref 3.5–5.1)
Sodium: 133 mmol/L — ABNORMAL LOW (ref 135–145)
Total Bilirubin: 0.2 mg/dL — ABNORMAL LOW (ref 0.3–1.2)
Total Protein: 8 g/dL (ref 6.5–8.1)

## 2020-02-27 LAB — CBC
HCT: 37 % (ref 36.0–46.0)
Hemoglobin: 11 g/dL — ABNORMAL LOW (ref 12.0–15.0)
MCH: 26.4 pg (ref 26.0–34.0)
MCHC: 29.7 g/dL — ABNORMAL LOW (ref 30.0–36.0)
MCV: 88.7 fL (ref 80.0–100.0)
Platelets: 368 10*3/uL (ref 150–400)
RBC: 4.17 MIL/uL (ref 3.87–5.11)
RDW: 16.4 % — ABNORMAL HIGH (ref 11.5–15.5)
WBC: 6.7 10*3/uL (ref 4.0–10.5)
nRBC: 0 % (ref 0.0–0.2)

## 2020-02-28 ENCOUNTER — Other Ambulatory Visit (HOSPITAL_COMMUNITY): Payer: BC Managed Care – PPO

## 2020-02-28 LAB — TRIGLYCERIDES: Triglycerides: 177 mg/dL — ABNORMAL HIGH (ref <150)

## 2020-02-28 MED ORDER — IOHEXOL 300 MG/ML  SOLN
100.0000 mL | Freq: Once | INTRAMUSCULAR | Status: AC | PRN
  Administered 2020-02-28: 100 mL via INTRAVENOUS

## 2020-02-29 LAB — PHOSPHORUS: Phosphorus: 4 mg/dL (ref 2.5–4.6)

## 2020-02-29 LAB — MAGNESIUM: Magnesium: 1.8 mg/dL (ref 1.7–2.4)

## 2020-03-01 LAB — VITAMIN D 25 HYDROXY (VIT D DEFICIENCY, FRACTURES): Vit D, 25-Hydroxy: 25.54 ng/mL — ABNORMAL LOW (ref 30–100)

## 2020-03-03 LAB — COMPREHENSIVE METABOLIC PANEL
ALT: 39 U/L (ref 0–44)
AST: 39 U/L (ref 15–41)
Albumin: 2 g/dL — ABNORMAL LOW (ref 3.5–5.0)
Alkaline Phosphatase: 241 U/L — ABNORMAL HIGH (ref 38–126)
Anion gap: 10 (ref 5–15)
BUN: 17 mg/dL (ref 6–20)
CO2: 20 mmol/L — ABNORMAL LOW (ref 22–32)
Calcium: 8.1 mg/dL — ABNORMAL LOW (ref 8.9–10.3)
Chloride: 105 mmol/L (ref 98–111)
Creatinine, Ser: 0.39 mg/dL — ABNORMAL LOW (ref 0.44–1.00)
GFR, Estimated: >60 mL/min (ref >60)
Glucose, Bld: 117 mg/dL — ABNORMAL HIGH (ref 70–99)
Potassium: 3.8 mmol/L (ref 3.5–5.1)
Sodium: 135 mmol/L (ref 135–145)
Total Bilirubin: 0.2 mg/dL — ABNORMAL LOW (ref 0.3–1.2)
Total Protein: 7.3 g/dL (ref 6.5–8.1)

## 2020-03-03 LAB — CBC
HCT: 34.5 % — ABNORMAL LOW (ref 36.0–46.0)
Hemoglobin: 10.6 g/dL — ABNORMAL LOW (ref 12.0–15.0)
MCH: 26.8 pg (ref 26.0–34.0)
MCHC: 30.7 g/dL (ref 30.0–36.0)
MCV: 87.1 fL (ref 80.0–100.0)
Platelets: 296 10*3/uL (ref 150–400)
RBC: 3.96 MIL/uL (ref 3.87–5.11)
RDW: 16 % — ABNORMAL HIGH (ref 11.5–15.5)
WBC: 7.1 10*3/uL (ref 4.0–10.5)
nRBC: 0 % (ref 0.0–0.2)

## 2020-03-03 LAB — MAGNESIUM: Magnesium: 1.8 mg/dL (ref 1.7–2.4)

## 2020-03-03 LAB — PHOSPHORUS: Phosphorus: 3.5 mg/dL (ref 2.5–4.6)

## 2020-03-06 LAB — CBC
HCT: 40.1 % (ref 36.0–46.0)
Hemoglobin: 12.2 g/dL (ref 12.0–15.0)
MCH: 26.6 pg (ref 26.0–34.0)
MCHC: 30.4 g/dL (ref 30.0–36.0)
MCV: 87.6 fL (ref 80.0–100.0)
Platelets: 346 10*3/uL (ref 150–400)
RBC: 4.58 MIL/uL (ref 3.87–5.11)
RDW: 16.2 % — ABNORMAL HIGH (ref 11.5–15.5)
WBC: 6.9 10*3/uL (ref 4.0–10.5)
nRBC: 0 % (ref 0.0–0.2)

## 2020-03-06 LAB — BASIC METABOLIC PANEL
Anion gap: 9 (ref 5–15)
BUN: 25 mg/dL — ABNORMAL HIGH (ref 6–20)
CO2: 22 mmol/L (ref 22–32)
Calcium: 8.9 mg/dL (ref 8.9–10.3)
Chloride: 107 mmol/L (ref 98–111)
Creatinine, Ser: 0.48 mg/dL (ref 0.44–1.00)
GFR, Estimated: >60 mL/min (ref >60)
Glucose, Bld: 118 mg/dL — ABNORMAL HIGH (ref 70–99)
Potassium: 3.8 mmol/L (ref 3.5–5.1)
Sodium: 138 mmol/L (ref 135–145)

## 2020-03-06 LAB — MAGNESIUM: Magnesium: 1.9 mg/dL (ref 1.7–2.4)

## 2020-03-06 LAB — PHOSPHORUS: Phosphorus: 4.3 mg/dL (ref 2.5–4.6)

## 2020-03-08 DIAGNOSIS — K632 Fistula of intestine: Secondary | ICD-10-CM | POA: Diagnosis not present

## 2020-03-08 DIAGNOSIS — K651 Peritoneal abscess: Secondary | ICD-10-CM | POA: Diagnosis not present

## 2020-03-08 DIAGNOSIS — K631 Perforation of intestine (nontraumatic): Secondary | ICD-10-CM | POA: Diagnosis not present

## 2020-03-12 DIAGNOSIS — K651 Peritoneal abscess: Secondary | ICD-10-CM | POA: Diagnosis not present

## 2020-03-12 DIAGNOSIS — K631 Perforation of intestine (nontraumatic): Secondary | ICD-10-CM | POA: Diagnosis not present

## 2020-03-12 DIAGNOSIS — K632 Fistula of intestine: Secondary | ICD-10-CM | POA: Diagnosis not present

## 2020-03-14 ENCOUNTER — Encounter: Payer: Self-pay | Admitting: General Surgery

## 2020-03-14 ENCOUNTER — Telehealth: Payer: Self-pay | Admitting: Family Medicine

## 2020-03-14 ENCOUNTER — Ambulatory Visit (INDEPENDENT_AMBULATORY_CARE_PROVIDER_SITE_OTHER): Payer: BC Managed Care – PPO | Admitting: General Surgery

## 2020-03-14 ENCOUNTER — Other Ambulatory Visit: Payer: Self-pay

## 2020-03-14 VITALS — BP 102/72 | HR 119 | Temp 96.9°F | Resp 16 | Ht 61.0 in | Wt 148.0 lb

## 2020-03-14 DIAGNOSIS — K651 Peritoneal abscess: Secondary | ICD-10-CM

## 2020-03-14 DIAGNOSIS — Z09 Encounter for follow-up examination after completed treatment for conditions other than malignant neoplasm: Secondary | ICD-10-CM

## 2020-03-14 MED ORDER — HYDROCODONE-ACETAMINOPHEN 5-325 MG PO TABS
1.0000 | ORAL_TABLET | ORAL | 0 refills | Status: DC | PRN
Start: 2020-03-14 — End: 2020-05-06

## 2020-03-14 MED ORDER — FENTANYL 25 MCG/HR TD PT72: 1.0000 | MEDICATED_PATCH | TRANSDERMAL | 0 refills | Status: DC

## 2020-03-14 NOTE — Telephone Encounter (Signed)
PA Submitted through [email or web address] and received the following:  Your information has been submitted to [employer]. [employer] [place] will review the request and notify you of the determination decision directly, typically within 72 hours of receiving all information.  You will also receive your request decision electronically. To check for an update later, open this request again from your dashboard.  If [employer] [place] has not responded within the specified timeframe or if you have any questions about your PA submission, contact [employer] [place] directly at [phone]. 

## 2020-03-14 NOTE — Progress Notes (Signed)
Subjective:     [name]  Patient here for postoperative visit.  Previous records reviewed.  Patient had a complicated course after undergoing emergency surgery for perforated colon most likely secondary to perforated appendicitis.  She has been in a skilled nursing unit since her surgery in October 2021 for rehabilitation and treatment of her wound.  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  She was just discharged from the skilled nursing unit on March 08, 2020.  She is being seen by home health.  She is continuing TPN.  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  An ostomy appliance has been placed along the midportion of her wound. Objective:    BP 102/72   Pulse (!) 119   Temp (!) 96.9 F (36.1 C) (Other (Comment))   Resp 16   Ht 5\' 1"  (1.549 m)   Wt 148 lb (67.1 kg)   SpO2 97%   BMI 27.96 kg/m   General:  alert, cooperative and fatigued  A PICC line is present in the right arm.  An ileostomy is present along the right side of her abdomen.  An ostomy appliance is present over a midline enterocutaneous fistula with succus present in the bag.  A JP drain is present in the right lower quadrant which she states has been present since her original surgery.  Cloudy fluid is draining.   She last had a CT scan of the abdomen February 28, 2020     Assessment:    Recent discharge from skilled nursing unit with enterocutaneous fistula, deconditioning, and JP drain still in place.    Plan:   Patient has had a long complicated course after her emergent surgery.  She does have an enterocutaneous fistula in the face of a significant resection of small bowel.  At this point, we will need to continue TPN to see if we can get this closed.  I told her that she will need ongoing care for the next few months.  No surgical intervention is warranted at this time.  I will get a follow-up CT scan of the abdomen and pelvis to assess whether we can get the JP drain  removed.  Will reorder her fentanyl patch and pain medication.  Follow-up is pending CT scan results.

## 2020-03-15 NOTE — Telephone Encounter (Signed)
Pt had not gotten patches through ins yet therefore it was considered initial therapy and not continuation of therapy. New paperwork filled out and faxed to [facility].

## 2020-03-16 DIAGNOSIS — K632 Fistula of intestine: Secondary | ICD-10-CM | POA: Diagnosis not present

## 2020-03-16 DIAGNOSIS — K651 Peritoneal abscess: Secondary | ICD-10-CM | POA: Diagnosis not present

## 2020-03-16 DIAGNOSIS — K631 Perforation of intestine (nontraumatic): Secondary | ICD-10-CM | POA: Diagnosis not present

## 2020-03-17 DIAGNOSIS — K631 Perforation of intestine (nontraumatic): Secondary | ICD-10-CM | POA: Diagnosis not present

## 2020-03-17 DIAGNOSIS — K632 Fistula of intestine: Secondary | ICD-10-CM | POA: Diagnosis not present

## 2020-03-17 DIAGNOSIS — K651 Peritoneal abscess: Secondary | ICD-10-CM | POA: Diagnosis not present

## 2020-03-18 DIAGNOSIS — Z48815 Encounter for surgical aftercare following surgery on the digestive system: Secondary | ICD-10-CM | POA: Diagnosis not present

## 2020-03-18 DIAGNOSIS — K631 Perforation of intestine (nontraumatic): Secondary | ICD-10-CM | POA: Diagnosis not present

## 2020-03-18 DIAGNOSIS — K632 Fistula of intestine: Secondary | ICD-10-CM | POA: Diagnosis not present

## 2020-03-18 DIAGNOSIS — K651 Peritoneal abscess: Secondary | ICD-10-CM | POA: Diagnosis not present

## 2020-03-18 NOTE — Telephone Encounter (Signed)
Approvedon January 21 Effective from 03/14/2020 through 06/06/2020.

## 2020-03-19 DIAGNOSIS — K651 Peritoneal abscess: Secondary | ICD-10-CM | POA: Diagnosis not present

## 2020-03-19 DIAGNOSIS — K632 Fistula of intestine: Secondary | ICD-10-CM | POA: Diagnosis not present

## 2020-03-19 DIAGNOSIS — K631 Perforation of intestine (nontraumatic): Secondary | ICD-10-CM | POA: Diagnosis not present

## 2020-03-20 DIAGNOSIS — K631 Perforation of intestine (nontraumatic): Secondary | ICD-10-CM | POA: Diagnosis not present

## 2020-03-20 DIAGNOSIS — K632 Fistula of intestine: Secondary | ICD-10-CM | POA: Diagnosis not present

## 2020-03-20 DIAGNOSIS — K651 Peritoneal abscess: Secondary | ICD-10-CM | POA: Diagnosis not present

## 2020-03-21 DIAGNOSIS — K632 Fistula of intestine: Secondary | ICD-10-CM | POA: Diagnosis not present

## 2020-03-21 DIAGNOSIS — K631 Perforation of intestine (nontraumatic): Secondary | ICD-10-CM | POA: Diagnosis not present

## 2020-03-21 DIAGNOSIS — K651 Peritoneal abscess: Secondary | ICD-10-CM | POA: Diagnosis not present

## 2020-03-23 DIAGNOSIS — K631 Perforation of intestine (nontraumatic): Secondary | ICD-10-CM | POA: Diagnosis not present

## 2020-03-23 DIAGNOSIS — K651 Peritoneal abscess: Secondary | ICD-10-CM | POA: Diagnosis not present

## 2020-03-23 DIAGNOSIS — K632 Fistula of intestine: Secondary | ICD-10-CM | POA: Diagnosis not present

## 2020-03-24 DIAGNOSIS — K631 Perforation of intestine (nontraumatic): Secondary | ICD-10-CM | POA: Diagnosis not present

## 2020-03-24 DIAGNOSIS — K651 Peritoneal abscess: Secondary | ICD-10-CM | POA: Diagnosis not present

## 2020-03-24 DIAGNOSIS — K632 Fistula of intestine: Secondary | ICD-10-CM | POA: Diagnosis not present

## 2020-03-25 DIAGNOSIS — K632 Fistula of intestine: Secondary | ICD-10-CM | POA: Diagnosis not present

## 2020-03-25 DIAGNOSIS — K651 Peritoneal abscess: Secondary | ICD-10-CM | POA: Diagnosis not present

## 2020-03-25 DIAGNOSIS — K631 Perforation of intestine (nontraumatic): Secondary | ICD-10-CM | POA: Diagnosis not present

## 2020-03-26 DIAGNOSIS — K651 Peritoneal abscess: Secondary | ICD-10-CM | POA: Diagnosis not present

## 2020-03-26 DIAGNOSIS — K631 Perforation of intestine (nontraumatic): Secondary | ICD-10-CM | POA: Diagnosis not present

## 2020-03-26 DIAGNOSIS — K632 Fistula of intestine: Secondary | ICD-10-CM | POA: Diagnosis not present

## 2020-03-27 DIAGNOSIS — K632 Fistula of intestine: Secondary | ICD-10-CM | POA: Diagnosis not present

## 2020-03-27 DIAGNOSIS — K651 Peritoneal abscess: Secondary | ICD-10-CM | POA: Diagnosis not present

## 2020-03-27 DIAGNOSIS — K631 Perforation of intestine (nontraumatic): Secondary | ICD-10-CM | POA: Diagnosis not present

## 2020-03-28 DIAGNOSIS — K632 Fistula of intestine: Secondary | ICD-10-CM | POA: Diagnosis not present

## 2020-03-28 DIAGNOSIS — K631 Perforation of intestine (nontraumatic): Secondary | ICD-10-CM | POA: Diagnosis not present

## 2020-03-28 DIAGNOSIS — K651 Peritoneal abscess: Secondary | ICD-10-CM | POA: Diagnosis not present

## 2020-03-29 DIAGNOSIS — K651 Peritoneal abscess: Secondary | ICD-10-CM | POA: Diagnosis not present

## 2020-03-29 DIAGNOSIS — K631 Perforation of intestine (nontraumatic): Secondary | ICD-10-CM | POA: Diagnosis not present

## 2020-03-29 DIAGNOSIS — K632 Fistula of intestine: Secondary | ICD-10-CM | POA: Diagnosis not present

## 2020-03-30 DIAGNOSIS — K651 Peritoneal abscess: Secondary | ICD-10-CM | POA: Diagnosis not present

## 2020-03-30 DIAGNOSIS — K632 Fistula of intestine: Secondary | ICD-10-CM | POA: Diagnosis not present

## 2020-03-30 DIAGNOSIS — K631 Perforation of intestine (nontraumatic): Secondary | ICD-10-CM | POA: Diagnosis not present

## 2020-03-31 DIAGNOSIS — K631 Perforation of intestine (nontraumatic): Secondary | ICD-10-CM | POA: Diagnosis not present

## 2020-03-31 DIAGNOSIS — K651 Peritoneal abscess: Secondary | ICD-10-CM | POA: Diagnosis not present

## 2020-03-31 DIAGNOSIS — K632 Fistula of intestine: Secondary | ICD-10-CM | POA: Diagnosis not present

## 2020-04-01 DIAGNOSIS — K631 Perforation of intestine (nontraumatic): Secondary | ICD-10-CM | POA: Diagnosis not present

## 2020-04-01 DIAGNOSIS — K651 Peritoneal abscess: Secondary | ICD-10-CM | POA: Diagnosis not present

## 2020-04-01 DIAGNOSIS — K632 Fistula of intestine: Secondary | ICD-10-CM | POA: Diagnosis not present

## 2020-04-02 DIAGNOSIS — K651 Peritoneal abscess: Secondary | ICD-10-CM | POA: Diagnosis not present

## 2020-04-02 DIAGNOSIS — K632 Fistula of intestine: Secondary | ICD-10-CM | POA: Diagnosis not present

## 2020-04-02 DIAGNOSIS — K631 Perforation of intestine (nontraumatic): Secondary | ICD-10-CM | POA: Diagnosis not present

## 2020-04-03 DIAGNOSIS — K632 Fistula of intestine: Secondary | ICD-10-CM | POA: Diagnosis not present

## 2020-04-03 DIAGNOSIS — K651 Peritoneal abscess: Secondary | ICD-10-CM | POA: Diagnosis not present

## 2020-04-03 DIAGNOSIS — K631 Perforation of intestine (nontraumatic): Secondary | ICD-10-CM | POA: Diagnosis not present

## 2020-04-04 DIAGNOSIS — K632 Fistula of intestine: Secondary | ICD-10-CM | POA: Diagnosis not present

## 2020-04-04 DIAGNOSIS — K631 Perforation of intestine (nontraumatic): Secondary | ICD-10-CM | POA: Diagnosis not present

## 2020-04-04 DIAGNOSIS — K651 Peritoneal abscess: Secondary | ICD-10-CM | POA: Diagnosis not present

## 2020-04-05 DIAGNOSIS — K651 Peritoneal abscess: Secondary | ICD-10-CM | POA: Diagnosis not present

## 2020-04-05 DIAGNOSIS — K631 Perforation of intestine (nontraumatic): Secondary | ICD-10-CM | POA: Diagnosis not present

## 2020-04-05 DIAGNOSIS — K632 Fistula of intestine: Secondary | ICD-10-CM | POA: Diagnosis not present

## 2020-04-06 DIAGNOSIS — K632 Fistula of intestine: Secondary | ICD-10-CM | POA: Diagnosis not present

## 2020-04-06 DIAGNOSIS — K631 Perforation of intestine (nontraumatic): Secondary | ICD-10-CM | POA: Diagnosis not present

## 2020-04-06 DIAGNOSIS — K651 Peritoneal abscess: Secondary | ICD-10-CM | POA: Diagnosis not present

## 2020-04-07 DIAGNOSIS — K631 Perforation of intestine (nontraumatic): Secondary | ICD-10-CM | POA: Diagnosis not present

## 2020-04-07 DIAGNOSIS — K632 Fistula of intestine: Secondary | ICD-10-CM | POA: Diagnosis not present

## 2020-04-07 DIAGNOSIS — K651 Peritoneal abscess: Secondary | ICD-10-CM | POA: Diagnosis not present

## 2020-04-08 DIAGNOSIS — E46 Unspecified protein-calorie malnutrition: Secondary | ICD-10-CM | POA: Diagnosis not present

## 2020-04-08 DIAGNOSIS — Z452 Encounter for adjustment and management of vascular access device: Secondary | ICD-10-CM | POA: Diagnosis not present

## 2020-04-08 DIAGNOSIS — Z48815 Encounter for surgical aftercare following surgery on the digestive system: Secondary | ICD-10-CM | POA: Diagnosis not present

## 2020-04-08 DIAGNOSIS — K631 Perforation of intestine (nontraumatic): Secondary | ICD-10-CM | POA: Diagnosis not present

## 2020-04-08 DIAGNOSIS — K651 Peritoneal abscess: Secondary | ICD-10-CM | POA: Diagnosis not present

## 2020-04-08 DIAGNOSIS — K632 Fistula of intestine: Secondary | ICD-10-CM | POA: Diagnosis not present

## 2020-04-08 DIAGNOSIS — Z433 Encounter for attention to colostomy: Secondary | ICD-10-CM | POA: Diagnosis not present

## 2020-04-09 DIAGNOSIS — K632 Fistula of intestine: Secondary | ICD-10-CM | POA: Diagnosis not present

## 2020-04-09 DIAGNOSIS — K651 Peritoneal abscess: Secondary | ICD-10-CM | POA: Diagnosis not present

## 2020-04-09 DIAGNOSIS — K631 Perforation of intestine (nontraumatic): Secondary | ICD-10-CM | POA: Diagnosis not present

## 2020-04-10 ENCOUNTER — Ambulatory Visit: Payer: BC Managed Care – PPO | Admitting: Orthopedic Surgery

## 2020-04-10 DIAGNOSIS — Z433 Encounter for attention to colostomy: Secondary | ICD-10-CM | POA: Diagnosis not present

## 2020-04-10 DIAGNOSIS — K651 Peritoneal abscess: Secondary | ICD-10-CM | POA: Diagnosis not present

## 2020-04-10 DIAGNOSIS — Z452 Encounter for adjustment and management of vascular access device: Secondary | ICD-10-CM | POA: Diagnosis not present

## 2020-04-10 DIAGNOSIS — K631 Perforation of intestine (nontraumatic): Secondary | ICD-10-CM | POA: Diagnosis not present

## 2020-04-10 DIAGNOSIS — Z48815 Encounter for surgical aftercare following surgery on the digestive system: Secondary | ICD-10-CM | POA: Diagnosis not present

## 2020-04-10 DIAGNOSIS — E46 Unspecified protein-calorie malnutrition: Secondary | ICD-10-CM | POA: Diagnosis not present

## 2020-04-10 DIAGNOSIS — K632 Fistula of intestine: Secondary | ICD-10-CM | POA: Diagnosis not present

## 2020-04-11 DIAGNOSIS — K631 Perforation of intestine (nontraumatic): Secondary | ICD-10-CM | POA: Diagnosis not present

## 2020-04-11 DIAGNOSIS — K651 Peritoneal abscess: Secondary | ICD-10-CM | POA: Diagnosis not present

## 2020-04-11 DIAGNOSIS — K632 Fistula of intestine: Secondary | ICD-10-CM | POA: Diagnosis not present

## 2020-04-12 DIAGNOSIS — K632 Fistula of intestine: Secondary | ICD-10-CM | POA: Diagnosis not present

## 2020-04-12 DIAGNOSIS — K631 Perforation of intestine (nontraumatic): Secondary | ICD-10-CM | POA: Diagnosis not present

## 2020-04-12 DIAGNOSIS — K651 Peritoneal abscess: Secondary | ICD-10-CM | POA: Diagnosis not present

## 2020-04-13 DIAGNOSIS — K651 Peritoneal abscess: Secondary | ICD-10-CM | POA: Diagnosis not present

## 2020-04-13 DIAGNOSIS — K632 Fistula of intestine: Secondary | ICD-10-CM | POA: Diagnosis not present

## 2020-04-13 DIAGNOSIS — K631 Perforation of intestine (nontraumatic): Secondary | ICD-10-CM | POA: Diagnosis not present

## 2020-04-14 DIAGNOSIS — K631 Perforation of intestine (nontraumatic): Secondary | ICD-10-CM | POA: Diagnosis not present

## 2020-04-14 DIAGNOSIS — K632 Fistula of intestine: Secondary | ICD-10-CM | POA: Diagnosis not present

## 2020-04-14 DIAGNOSIS — K651 Peritoneal abscess: Secondary | ICD-10-CM | POA: Diagnosis not present

## 2020-04-15 DIAGNOSIS — K632 Fistula of intestine: Secondary | ICD-10-CM | POA: Diagnosis not present

## 2020-04-15 DIAGNOSIS — K631 Perforation of intestine (nontraumatic): Secondary | ICD-10-CM | POA: Diagnosis not present

## 2020-04-15 DIAGNOSIS — K651 Peritoneal abscess: Secondary | ICD-10-CM | POA: Diagnosis not present

## 2020-04-16 DIAGNOSIS — K632 Fistula of intestine: Secondary | ICD-10-CM | POA: Diagnosis not present

## 2020-04-16 DIAGNOSIS — Z452 Encounter for adjustment and management of vascular access device: Secondary | ICD-10-CM | POA: Diagnosis not present

## 2020-04-16 DIAGNOSIS — K651 Peritoneal abscess: Secondary | ICD-10-CM | POA: Diagnosis not present

## 2020-04-16 DIAGNOSIS — Z433 Encounter for attention to colostomy: Secondary | ICD-10-CM | POA: Diagnosis not present

## 2020-04-16 DIAGNOSIS — K631 Perforation of intestine (nontraumatic): Secondary | ICD-10-CM | POA: Diagnosis not present

## 2020-04-16 DIAGNOSIS — E46 Unspecified protein-calorie malnutrition: Secondary | ICD-10-CM | POA: Diagnosis not present

## 2020-04-16 DIAGNOSIS — Z48815 Encounter for surgical aftercare following surgery on the digestive system: Secondary | ICD-10-CM | POA: Diagnosis not present

## 2020-04-17 DIAGNOSIS — K632 Fistula of intestine: Secondary | ICD-10-CM | POA: Diagnosis not present

## 2020-04-17 DIAGNOSIS — K651 Peritoneal abscess: Secondary | ICD-10-CM | POA: Diagnosis not present

## 2020-04-17 DIAGNOSIS — K631 Perforation of intestine (nontraumatic): Secondary | ICD-10-CM | POA: Diagnosis not present

## 2020-04-18 DIAGNOSIS — K651 Peritoneal abscess: Secondary | ICD-10-CM | POA: Diagnosis not present

## 2020-04-18 DIAGNOSIS — K632 Fistula of intestine: Secondary | ICD-10-CM | POA: Diagnosis not present

## 2020-04-18 DIAGNOSIS — K631 Perforation of intestine (nontraumatic): Secondary | ICD-10-CM | POA: Diagnosis not present

## 2020-04-22 DIAGNOSIS — K631 Perforation of intestine (nontraumatic): Secondary | ICD-10-CM | POA: Diagnosis not present

## 2020-04-22 DIAGNOSIS — K651 Peritoneal abscess: Secondary | ICD-10-CM | POA: Diagnosis not present

## 2020-04-22 DIAGNOSIS — K632 Fistula of intestine: Secondary | ICD-10-CM | POA: Diagnosis not present

## 2020-04-25 DIAGNOSIS — K631 Perforation of intestine (nontraumatic): Secondary | ICD-10-CM | POA: Diagnosis not present

## 2020-04-25 DIAGNOSIS — K632 Fistula of intestine: Secondary | ICD-10-CM | POA: Diagnosis not present

## 2020-04-25 DIAGNOSIS — K651 Peritoneal abscess: Secondary | ICD-10-CM | POA: Diagnosis not present

## 2020-04-26 DIAGNOSIS — K632 Fistula of intestine: Secondary | ICD-10-CM | POA: Diagnosis not present

## 2020-04-26 DIAGNOSIS — K651 Peritoneal abscess: Secondary | ICD-10-CM | POA: Diagnosis not present

## 2020-04-26 DIAGNOSIS — K631 Perforation of intestine (nontraumatic): Secondary | ICD-10-CM | POA: Diagnosis not present

## 2020-04-27 DIAGNOSIS — K632 Fistula of intestine: Secondary | ICD-10-CM | POA: Diagnosis not present

## 2020-04-27 DIAGNOSIS — K651 Peritoneal abscess: Secondary | ICD-10-CM | POA: Diagnosis not present

## 2020-04-27 DIAGNOSIS — K631 Perforation of intestine (nontraumatic): Secondary | ICD-10-CM | POA: Diagnosis not present

## 2020-04-28 DIAGNOSIS — K631 Perforation of intestine (nontraumatic): Secondary | ICD-10-CM | POA: Diagnosis not present

## 2020-04-28 DIAGNOSIS — K632 Fistula of intestine: Secondary | ICD-10-CM | POA: Diagnosis not present

## 2020-04-28 DIAGNOSIS — K651 Peritoneal abscess: Secondary | ICD-10-CM | POA: Diagnosis not present

## 2020-04-29 DIAGNOSIS — K631 Perforation of intestine (nontraumatic): Secondary | ICD-10-CM | POA: Diagnosis not present

## 2020-04-29 DIAGNOSIS — K632 Fistula of intestine: Secondary | ICD-10-CM | POA: Diagnosis not present

## 2020-04-29 DIAGNOSIS — K651 Peritoneal abscess: Secondary | ICD-10-CM | POA: Diagnosis not present

## 2020-04-30 DIAGNOSIS — K632 Fistula of intestine: Secondary | ICD-10-CM | POA: Diagnosis not present

## 2020-04-30 DIAGNOSIS — K631 Perforation of intestine (nontraumatic): Secondary | ICD-10-CM | POA: Diagnosis not present

## 2020-04-30 DIAGNOSIS — K651 Peritoneal abscess: Secondary | ICD-10-CM | POA: Diagnosis not present

## 2020-05-01 DIAGNOSIS — K651 Peritoneal abscess: Secondary | ICD-10-CM | POA: Diagnosis not present

## 2020-05-01 DIAGNOSIS — K631 Perforation of intestine (nontraumatic): Secondary | ICD-10-CM | POA: Diagnosis not present

## 2020-05-01 DIAGNOSIS — K632 Fistula of intestine: Secondary | ICD-10-CM | POA: Diagnosis not present

## 2020-05-02 DIAGNOSIS — K651 Peritoneal abscess: Secondary | ICD-10-CM | POA: Diagnosis not present

## 2020-05-02 DIAGNOSIS — K631 Perforation of intestine (nontraumatic): Secondary | ICD-10-CM | POA: Diagnosis not present

## 2020-05-02 DIAGNOSIS — K632 Fistula of intestine: Secondary | ICD-10-CM | POA: Diagnosis not present

## 2020-05-03 ENCOUNTER — Ambulatory Visit (HOSPITAL_COMMUNITY)
Admission: RE | Admit: 2020-05-03 | Discharge: 2020-05-03 | Disposition: A | Discharge status: Home / Self Care | Payer: BC Managed Care – PPO | Source: Ambulatory Visit | Attending: General Surgery | Admitting: General Surgery

## 2020-05-03 ENCOUNTER — Other Ambulatory Visit: Payer: Self-pay

## 2020-05-03 DIAGNOSIS — K651 Peritoneal abscess: Secondary | ICD-10-CM | POA: Diagnosis not present

## 2020-05-03 DIAGNOSIS — K632 Fistula of intestine: Secondary | ICD-10-CM | POA: Diagnosis not present

## 2020-05-03 DIAGNOSIS — K631 Perforation of intestine (nontraumatic): Secondary | ICD-10-CM | POA: Diagnosis not present

## 2020-05-03 DIAGNOSIS — Z872 Personal history of diseases of the skin and subcutaneous tissue: Secondary | ICD-10-CM | POA: Diagnosis not present

## 2020-05-03 DIAGNOSIS — M533 Sacrococcygeal disorders, not elsewhere classified: Secondary | ICD-10-CM | POA: Diagnosis not present

## 2020-05-03 DIAGNOSIS — K760 Fatty (change of) liver, not elsewhere classified: Secondary | ICD-10-CM | POA: Diagnosis not present

## 2020-05-03 LAB — POCT I-STAT CREATININE: Creatinine, Ser: 0.4 mg/dL — ABNORMAL LOW (ref 0.44–1.00)

## 2020-05-03 MED ORDER — IOHEXOL 300 MG/ML  SOLN
100.0000 mL | Freq: Once | INTRAMUSCULAR | Status: AC | PRN
  Administered 2020-05-03: 100 mL via INTRAVENOUS

## 2020-05-04 DIAGNOSIS — K631 Perforation of intestine (nontraumatic): Secondary | ICD-10-CM | POA: Diagnosis not present

## 2020-05-04 DIAGNOSIS — K651 Peritoneal abscess: Secondary | ICD-10-CM | POA: Diagnosis not present

## 2020-05-04 DIAGNOSIS — K632 Fistula of intestine: Secondary | ICD-10-CM | POA: Diagnosis not present

## 2020-05-05 DIAGNOSIS — K631 Perforation of intestine (nontraumatic): Secondary | ICD-10-CM | POA: Diagnosis not present

## 2020-05-05 DIAGNOSIS — K651 Peritoneal abscess: Secondary | ICD-10-CM | POA: Diagnosis not present

## 2020-05-05 DIAGNOSIS — K632 Fistula of intestine: Secondary | ICD-10-CM | POA: Diagnosis not present

## 2020-05-06 ENCOUNTER — Telehealth (INDEPENDENT_AMBULATORY_CARE_PROVIDER_SITE_OTHER): Payer: BC Managed Care – PPO | Admitting: General Surgery

## 2020-05-06 DIAGNOSIS — K631 Perforation of intestine (nontraumatic): Secondary | ICD-10-CM | POA: Diagnosis not present

## 2020-05-06 DIAGNOSIS — K651 Peritoneal abscess: Secondary | ICD-10-CM | POA: Diagnosis not present

## 2020-05-06 DIAGNOSIS — K632 Fistula of intestine: Secondary | ICD-10-CM

## 2020-05-06 DIAGNOSIS — E441 Mild protein-calorie malnutrition: Secondary | ICD-10-CM | POA: Diagnosis not present

## 2020-05-06 DIAGNOSIS — Z760 Encounter for issue of repeat prescription: Secondary | ICD-10-CM | POA: Diagnosis not present

## 2020-05-06 MED ORDER — HYDROCODONE-ACETAMINOPHEN 5-325 MG PO TABS: 1.0000 | ORAL_TABLET | ORAL | 0 refills | Status: DC | PRN

## 2020-05-06 NOTE — Telephone Encounter (Signed)
[facility]  Patient of Dr. Lovell Sheehan with complicated history and EC fistula. Requesting pain medication. Is off fentanyl patches.  Will refill once while he is out of the office.

## 2020-05-07 DIAGNOSIS — K632 Fistula of intestine: Secondary | ICD-10-CM | POA: Diagnosis not present

## 2020-05-07 DIAGNOSIS — K651 Peritoneal abscess: Secondary | ICD-10-CM | POA: Diagnosis not present

## 2020-05-07 DIAGNOSIS — K631 Perforation of intestine (nontraumatic): Secondary | ICD-10-CM | POA: Diagnosis not present

## 2020-05-08 DIAGNOSIS — K651 Peritoneal abscess: Secondary | ICD-10-CM | POA: Diagnosis not present

## 2020-05-08 DIAGNOSIS — K631 Perforation of intestine (nontraumatic): Secondary | ICD-10-CM | POA: Diagnosis not present

## 2020-05-08 DIAGNOSIS — K632 Fistula of intestine: Secondary | ICD-10-CM | POA: Diagnosis not present

## 2020-05-09 ENCOUNTER — Ambulatory Visit (INDEPENDENT_AMBULATORY_CARE_PROVIDER_SITE_OTHER): Payer: Self-pay | Admitting: General Surgery

## 2020-05-09 ENCOUNTER — Other Ambulatory Visit: Payer: Self-pay

## 2020-05-09 DIAGNOSIS — K651 Peritoneal abscess: Secondary | ICD-10-CM | POA: Diagnosis not present

## 2020-05-09 DIAGNOSIS — Z09 Encounter for follow-up examination after completed treatment for conditions other than malignant neoplasm: Secondary | ICD-10-CM

## 2020-05-09 DIAGNOSIS — K631 Perforation of intestine (nontraumatic): Secondary | ICD-10-CM | POA: Diagnosis not present

## 2020-05-09 DIAGNOSIS — K632 Fistula of intestine: Secondary | ICD-10-CM | POA: Diagnosis not present

## 2020-05-10 ENCOUNTER — Encounter (HOSPITAL_COMMUNITY): Payer: Self-pay | Admitting: *Deleted

## 2020-05-10 ENCOUNTER — Other Ambulatory Visit: Payer: Self-pay

## 2020-05-10 ENCOUNTER — Emergency Department (HOSPITAL_COMMUNITY)
Admission: EM | Admit: 2020-05-10 | Discharge: 2020-05-10 | Disposition: A | Discharge status: Home / Self Care | Payer: BC Managed Care – PPO | Attending: Emergency Medicine | Admitting: Emergency Medicine

## 2020-05-10 ENCOUNTER — Encounter: Payer: Self-pay | Admitting: General Surgery

## 2020-05-10 DIAGNOSIS — K632 Fistula of intestine: Secondary | ICD-10-CM | POA: Diagnosis not present

## 2020-05-10 DIAGNOSIS — I10 Essential (primary) hypertension: Secondary | ICD-10-CM | POA: Insufficient documentation

## 2020-05-10 DIAGNOSIS — Z452 Encounter for adjustment and management of vascular access device: Secondary | ICD-10-CM | POA: Insufficient documentation

## 2020-05-10 DIAGNOSIS — T80219A Unspecified infection due to central venous catheter, initial encounter: Secondary | ICD-10-CM | POA: Diagnosis not present

## 2020-05-10 DIAGNOSIS — Z794 Long term (current) use of insulin: Secondary | ICD-10-CM | POA: Diagnosis not present

## 2020-05-10 DIAGNOSIS — K651 Peritoneal abscess: Secondary | ICD-10-CM | POA: Diagnosis not present

## 2020-05-10 DIAGNOSIS — K631 Perforation of intestine (nontraumatic): Secondary | ICD-10-CM | POA: Diagnosis not present

## 2020-05-10 HISTORY — DX: Perforation of intestine (nontraumatic): K63.1

## 2020-05-10 MED ORDER — SULFAMETHOXAZOLE-TRIMETHOPRIM 800-160 MG PO TABS
1.0000 | ORAL_TABLET | Freq: Once | ORAL | Status: AC
  Administered 2020-05-10: 1 via ORAL
  Filled 2020-05-10: qty 1

## 2020-05-10 MED ORDER — SULFAMETHOXAZOLE-TRIMETHOPRIM 800-160 MG PO TABS: 1.0000 | ORAL_TABLET | Freq: Two times a day (BID) | ORAL | 0 refills | Status: DC

## 2020-05-10 NOTE — ED Provider Notes (Signed)
[facility] EMERGENCY DEPARTMENT Provider Note   CSN: [id number] Arrival date & time: 05/10/20  1242     History Chief Complaint  Patient presents with  . Fever    [name] is a 60 y.o. female history of perforated appendicitis, GERD, hypertension, currently on TPN.  Patient presented to the ER at request of general surgeon Dr. Lovell Sheehan.  Patient has been experiencing intermittent fevers when she receives TPN for the past few days.  She is extensive medical history and has had her PICC line for the past 5 months following perforated appendicitis.  Patient reports that today she is feeling very well she has no complaints or concerns.  She reports that she had blood work performed this week by home health and received a phone call from her primary care provider who reported that all of her labs looked well.  Reports that the plan today is for her PICC line to be removed and she is going to take antibiotics and follow-up with Dr. Lovell Sheehan.  Patient denies fever today, denies headache, chills, chest pain/shortness of breath, abdominal pain, nausea/vomiting or any additional concerns  HPI     Past Medical History:  Diagnosis Date  . Anxiety   . Arthritis   . Colon perforation (HCC)    due to appendicitis  . GERD (gastroesophageal reflux disease)   . Hypertension   . S/P endoscopy    2008: noncritical Schatzki ring s/p dilation, normal esophagus and stomach, 2003: normal esophagus, s/p Maloney dilation, multiple antral erosions consistent with chronic gastritis, no H.pylori,     Patient Active Problem List   Diagnosis Date Noted  . PICC line infection, initial encounter   . 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 Small bowel perforation (HCC)   . S/P partial colectomy 12/04/2019  . Peritonitis due to abscess (HCC)   . Colon perforation (HCC)   . 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 Primary osteoarthritis of right knee   . Chest pain 03/02/2018  . Acquired  trigger finger of right middle finger   . Knee pain, right 04/06/2015  . Dysphagia 10/09/2010  . GERD (gastroesophageal reflux disease) 10/09/2010  . Encounter for screening colonoscopy 10/09/2010    Past Surgical History:  Procedure Laterality Date  . APPENDECTOMY    . BOWEL RESECTION N/A 12/06/2019   Procedure: PARTIAL SMALL BOWEL RESECTION WITH ILEOSTOMY;  Surgeon: Franky Macho, MD;  Location: [facility];  Service: General;  Laterality: N/A;  . CENTRAL LINE INSERTION N/A 12/06/2019   Procedure: CENTRAL LINE INSERTION;  Surgeon: Franky Macho, MD;  Location: [facility];  Service: General;  Laterality: N/A;  . CESAREAN SECTION    . CHOLECYSTECTOMY    . COLONOSCOPY  10/17/2010   normal   . ESOPHAGOGASTRODUODENOSCOPY  10/17/2010   non-critical Schatzki's ring s/p dilation, patulous EG junction, small hiatal hernia, otherwise normal stomach, first and second portion of duodenum  . ESOPHAGOGASTRODUODENOSCOPY N/A 10/26/2018   Procedure: ESOPHAGOGASTRODUODENOSCOPY (EGD);  Surgeon: Corbin Ade, MD;  Location: [facility];  Service: Endoscopy;  Laterality: N/A;  9:15am  . FOOT SURGERY Right    heel spur  . HAND SURGERY Left    carpal tunnel  . HERNIA REPAIR N/A    approx.    10 or 12 years ago  . KNEE ARTHROSCOPY WITH MEDIAL MENISECTOMY  01/07/2012   Procedure: KNEE ARTHROSCOPY WITH MEDIAL MENISECTOMY;  Surgeon: Darreld Mclean, MD;  Location: [facility];  Service: Orthopedics;  Laterality: Right;  . LAPAROTOMY N/A 12/04/2019  Procedure: EXPLORATORY LAPAROTOMY;  Surgeon: Franky Macho, MD;  Location: [facility];  Service: General;  Laterality: N/A;  . LAPAROTOMY N/A 12/06/2019   Procedure: EXPLORATORY LAPAROTOMY;  Surgeon: Franky Macho, MD;  Location: [facility];  Service: General;  Laterality: N/A;  [name] DILATION  10/17/2010   Procedure: Alvy Beal;  Surgeon: Corbin Ade, MD;  Location: [facility];  Service: Endoscopy;  Laterality: N/A;  . [name] DILATION N/A 10/26/2018   Procedure:  [name] DILATION;  Surgeon: Corbin Ade, MD;  [facility]: [facility];  Service: Endoscopy;  Laterality: N/A;  . PARTIAL COLECTOMY Right 12/04/2019   Procedure: RIGHT HEMI COLECTOMY WITH TERMINAL ILEUM RESECTION;  Surgeon: Franky Macho, MD;  Location: [facility];  Service: General;  Laterality: Right;  . TOTAL KNEE ARTHROPLASTY Right 04/18/2019   Procedure: RIGHT TOTAL KNEE ARTHROPLASTY;  Surgeon: Vickki Hearing, MD;  Location: [facility];  Service: Orthopedics;  Laterality: Right;  . TRIGGER FINGER RELEASE Right 08/20/2016   Procedure: RELEASE A-1 PULLEY RIGHT LONG FINGER;  Surgeon: Vickki Hearing, MD;  Location: [facility];  Service: Orthopedics;  Laterality: Right;     OB History   No obstetric history on file.     Family History  Problem Relation Age of Onset  . Cancer Father        esophagus ca, living  . Colon cancer Paternal Uncle        in 21s, early 46s  . Colon polyps Neg Hx     Social History   Tobacco Use  . Smoking status: Never Smoker  . Smokeless tobacco: Never Used  Vaping Use  . Vaping Use: Never used  Substance Use Topics  . Alcohol use: Yes    Comment: drinks gin on weekends   . Drug use: No    Home Medications Prior to Admission medications   Medication Sig Start Date End Date Taking? Authorizing Provider  sulfamethoxazole-trimethoprim (BACTRIM DS) 800-160 MG tablet Take 1 tablet by mouth 2 (two) times daily for 10 days. 05/10/20 05/20/20 Yes Harlene Salts A, PA-C  HM LIDOCAINE PATCH EX Apply topically.    [provider]  HYDROcodone-acetaminophen (NORCO/VICODIN) 5-325 MG tablet Take 1 tablet by mouth every 4 (four) hours as needed for severe pain. 05/06/20 05/06/21  Lucretia Roers, MD  insulin lispro (HUMALOG) 100 UNIT/ML injection Sliding scale    [provider]  pantoprazole (PROTONIX) 40 MG tablet Take 40 mg by mouth daily.    [provider]  thiamine 100 MG tablet Take 100 mg by mouth daily.    [provider]  Vitamin D, Cholecalciferol, 10 MCG (400 UNIT) CAPS Take by mouth.    [provider]  zinc sulfate 220 (50 Zn) MG capsule Take 220 mg by mouth daily.    [provider]    Allergies    5-alpha reductase inhibitors, Maxipime [cefepime], Metronidazole, and Acetaminophen-codeine  Review of Systems   Review of Systems Ten systems are reviewed and are negative for acute change except as noted in the HPI  Physical Exam Updated Vital Signs BP 116/72 (BP Location: Left Arm)   Pulse 91   Temp 98.1 F (36.7 C) (Oral)   Resp 18   Ht 5\' 1"  (1.549 m)   Wt 66.7 kg   SpO2 97%   BMI 27.78 kg/m   Physical Exam Constitutional:      General: She is not in acute distress.    Appearance: Normal appearance. She is well-developed. She  is not ill-appearing or diaphoretic.  HENT:     Head: Normocephalic and atraumatic.  Eyes:     General: Vision grossly intact. Gaze aligned appropriately.     Pupils: Pupils are equal, round, and reactive to light.  Neck:     Trachea: Trachea and phonation normal.  Pulmonary:     Effort: Pulmonary effort is normal. No respiratory distress.  Abdominal:     General: There is no distension.     Palpations: Abdomen is soft.     Tenderness: There is no abdominal tenderness. There is no guarding or rebound.  Musculoskeletal:        General: Normal range of motion.     Cervical back: Normal range of motion.  Skin:    General: Skin is warm and dry.  Neurological:     Mental Status: She is alert.     GCS: GCS eye subscore is 4. GCS verbal subscore is 5. GCS motor subscore is 6.     Comments: Speech is clear and goal oriented, follows commands Major Cranial nerves without deficit, no facial droop Moves extremities without ataxia, coordination intact  Psychiatric:        Behavior: Behavior normal.     ED Results / Procedures / Treatments   Labs (all labs ordered are listed, but only abnormal results are displayed) Labs  Reviewed  CULTURE, BLOOD (ROUTINE X 2)  CULTURE, BLOOD (ROUTINE X 2)  CATH TIP CULTURE    EKG None  Radiology No results found.  Procedures Procedures   Medications Ordered in ED Medications  sulfamethoxazole-trimethoprim (BACTRIM DS) 800-160 MG per tablet 1 tablet (has no administration in time range)    ED Course  I have reviewed the triage vital signs and the nursing notes.  Pertinent labs & imaging results that were available during my care of the patient were reviewed by me and considered in my medical decision making (see chart for details).    MDM Rules/Calculators/A&P                         Additional history obtained from: 1. Nursing notes from this visit. 2. Review of electronic medical records, I attempted to review patient's home health labs today but they are unavailable through EMR or care everywhere. ------------------ 1:21 PM: In person consult with general surgeon Dr. Lovell Sheehan who has seen and evaluated the patient today.  Advises to obtain blood cultures and also to culture the tip of the PICC line.  Advises PICC line removal today and to start patient on 10-day course of p.o. Bactrim.  Advises no additional labs are indicated, Dr. Lovell Sheehan reports he has reviewed patient's home health labs from yesterday in [place] and they looked well. - I reevaluated the patient she had blood cultures drawn, PICC line removed and the catheter tip was sent for culture.  Patient reports that she is feeling well today no fevers today, vital signs are stable here in the ED.  She has no complaints or concerns and is requesting discharge she states understanding of the plan today and plans to follow-up closely with general surgery and her PCP.  At this time there does not appear to be any evidence of an acute emergency medical condition and the patient appears stable for discharge with appropriate outpatient follow up. Diagnosis was discussed with patient who verbalizes  understanding of care plan and is agreeable to discharge. I have discussed return precautions with patient who verbalizes understanding. Patient encouraged  to follow-up with their PCP and General Surgery. All questions answered.  Patient's case discussed with Dr. Estell HarpinZammit who agrees with plan to discharge with follow-up.   Note: Portions of this report may have been transcribed using voice recognition software. Every effort was made to ensure accuracy; however, inadvertent computerized transcription errors may still be present. Final Clinical Impression(s) / ED Diagnoses Final diagnoses:  Encounter for removal of peripherally inserted central catheter (PICC)    Rx / DC Orders ED Discharge Orders         Ordered    sulfamethoxazole-trimethoprim (BACTRIM DS) 800-160 MG tablet  2 times daily        05/10/20 1424           Elizabeth PalauMorelli, Aldan Camey A, PA-C 05/10/20 1428    Bethann BerkshireZammit, Joseph, MD 05/11/20 1542

## 2020-05-10 NOTE — Progress Notes (Signed)
PICC line removed and tip culture sent to lab per orders. Dr Lovell Sheehan concerned for infection with suspicion of PICC as source. Patient tolerated procedure well. Sterile petroleum gauze applied to site with removal, direct pressure held for 3 minutes to site, and then pressure dressing applied. Patient advised to remain lying in bed for minimum of 30 minutes and to leave pressure dressing in place for 24 hrs. Patient verbalized understanding.

## 2020-05-10 NOTE — ED Triage Notes (Addendum)
Pt c/o fever, vomiting chills since Friday night after CT scan. She was having a follow up CT scan from where she had surgery 6 months ago. Pt has colostomy bag, JP drain, wound vac and PICC line in right arm in place. Pt reports when she got to the hospital her wound vac started leaking.

## 2020-05-10 NOTE — Progress Notes (Signed)
Telephone visit performed with the patient.  Patient called concerning the development of a fever soon after her TPN is attached to her PICC line.  It resolves after about 1 hour.  I also reviewed her CT scan results with her.  Her latest white blood cell count was within normal limits. I told her we would contact her home nurse to assess the PICC line and possibly change it.  I will also arrange follow-up in my office. Total telephone time was 5 minutes.  As this was a part of the global surgical fee, this was not a billable charge.

## 2020-05-10 NOTE — Discharge Instructions (Signed)
At this time there does not appear to be the presence of an emergent medical condition, however there is always the potential for conditions to change. Please read and follow the below instructions.  Please return to the Emergency Department immediately for any new or worsening symptoms. Please be sure to follow-up with Dr. Lovell Sheehan and your primary care provider for follow-up visits. Please take your antibiotic Bactrim as prescribed until complete to help with your symptoms.  Please drink enough water to avoid dehydration and get plenty of rest.  Go to the nearest Emergency Department immediately if: You have fever or chills You have numbness or tingling in your fingers, hand, or arm. Your arm looks blue and feels cold. You have signs of an air embolism, such as: Difficulty breathing. Chest pain. Coughing or wheezing. Skin that is pale, blue, cold, or clammy. Rapid pulse. Rapid breathing. Fainting. You have any new/concerning or worsening of symptoms.    Please read the additional information packets attached to your discharge summary.  Do not take your medicine if  develop an itchy rash, swelling in your mouth or lips, or difficulty breathing; call 911 and seek immediate emergency medical attention if this occurs.  You may review your lab tests and imaging results in their entirety on your MyChart account.  Please discuss all results of fully with your primary care provider and other specialist at your follow-up visit.  Note: Portions of this text may have been transcribed using voice recognition software. Every effort was made to ensure accuracy; however, inadvertent computerized transcription errors may still be present.

## 2020-05-10 NOTE — Consult Note (Signed)
Reason for Consult: Fevers with IV TPN Referring Physician: Self  [name] is an 60 y.o. female.  HPI: Patient is a 60 year old white female well-known to me, status post right hemicolectomy with partial small bowel resection in October 2021 for peritonitis secondary to perforated appendicitis who developed an enterocutaneous fistula and needed a PICC line placed for TPN.  The PICC line has been in place for 5 months.  She states that she develops fevers and chills as soon as the TPN is started in the evening, though resolving after an hour and a half.  As this was happening over the past few days, I told her to come to the emergency room to have the PICC line removed and a culture taken.  I did have labs recently as an outpatient from home health which showed a normal white blood cell count. She states she has had intermittent succus drainage from her midline incision.  Her ileostomy is present along the right side of her abdomen.  Past Medical History:  Diagnosis Date  . Anxiety   . Arthritis   . Colon perforation (HCC)    due to appendicitis  . GERD (gastroesophageal reflux disease)   . Hypertension   . S/P endoscopy    2008: noncritical Schatzki ring s/p dilation, normal esophagus and stomach, 2003: normal esophagus, s/p Maloney dilation, multiple antral erosions consistent with chronic gastritis, no H.pylori,     Past Surgical History:  Procedure Laterality Date  . APPENDECTOMY    . BOWEL RESECTION N/A 12/06/2019   Procedure: PARTIAL SMALL BOWEL RESECTION WITH ILEOSTOMY;  Surgeon: Franky Macho, MD;  Location: [facility];  Service: General;  Laterality: N/A;  . CENTRAL LINE INSERTION N/A 12/06/2019   Procedure: CENTRAL LINE INSERTION;  Surgeon: Franky Macho, MD;  Location: [facility];  Service: General;  Laterality: N/A;  . CESAREAN SECTION    . CHOLECYSTECTOMY    . COLONOSCOPY  10/17/2010   normal   . ESOPHAGOGASTRODUODENOSCOPY  10/17/2010   non-critical Schatzki's ring s/p  dilation, patulous EG junction, small hiatal hernia, otherwise normal stomach, first and second portion of duodenum  . ESOPHAGOGASTRODUODENOSCOPY N/A 10/26/2018   Procedure: ESOPHAGOGASTRODUODENOSCOPY (EGD);  Surgeon: Corbin Ade, MD;  [facility];  Service: Endoscopy;  Laterality: N/A;  9:15am  . FOOT SURGERY Right    heel spur  . HAND SURGERY Left    carpal tunnel  . HERNIA REPAIR N/A    approx.    10 or 12 years ago  . KNEE ARTHROSCOPY WITH MEDIAL MENISECTOMY  01/07/2012   Procedure: KNEE ARTHROSCOPY WITH MEDIAL MENISECTOMY;  Surgeon: Darreld Mclean, MD;  Location: [facility];  Service: Orthopedics;  Laterality: Right;  . LAPAROTOMY N/A 12/04/2019   Procedure: EXPLORATORY LAPAROTOMY;  Surgeon: Franky Macho, MD;  Location: [facility];  Service: General;  Laterality: N/A;  . LAPAROTOMY N/A 12/06/2019   Procedure: EXPLORATORY LAPAROTOMY;  Surgeon: Franky Macho, MD;  Location: [facility];  Service: General;  Laterality: N/A;  [name] DILATION  10/17/2010   Procedure: Alvy Beal;  Surgeon: Corbin Ade, MD;  [facility];  Service: Endoscopy;  Laterality: N/A;  . [name] DILATION N/A 10/26/2018   Procedure: [name] DILATION;  Surgeon: Corbin Ade, MD;  [facility];  Service: Endoscopy;  Laterality: N/A;  . PARTIAL COLECTOMY Right 12/04/2019   Procedure: RIGHT HEMI COLECTOMY WITH TERMINAL ILEUM RESECTION;  Surgeon: Franky Macho, MD;  Location: [facility];  Service: General;  Laterality: Right;  .  TOTAL KNEE ARTHROPLASTY Right 04/18/2019   Procedure: RIGHT TOTAL KNEE ARTHROPLASTY;  Surgeon: Vickki Hearing, MD;  Location: [facility];  Service: Orthopedics;  Laterality: Right;  . TRIGGER FINGER RELEASE Right 08/20/2016   Procedure: RELEASE A-1 PULLEY RIGHT LONG FINGER;  Surgeon: Vickki Hearing, MD;  Location: [facility];  Service: Orthopedics;  Laterality: Right;    Family History  Problem Relation Age of Onset  . Cancer Father        esophagus ca, living  .  Colon cancer Paternal Uncle        in 44s, early 76s  . Colon polyps Neg Hx     Social History:  reports that she has never smoked. She has never used smokeless tobacco. She reports current alcohol use. She reports that she does not use drugs.  Allergies:  Allergies  Allergen Reactions  . 5-Alpha Reductase Inhibitors   . Maxipime [Cefepime]     Hives    . Metronidazole     hives  . Acetaminophen-Codeine Rash    Tylenol with Codeine Tylenol #3,    Medications: I have reviewed the patient's current medications.  No results found for this or any previous visit (from the past 48 hour(s)).  No results found.  ROS:  Pertinent items are noted in HPI.  Blood pressure 116/72, pulse 91, temperature 98.1 F (36.7 C), temperature source Oral, resp. rate 18, height 5\' 1"  (1.549 m), weight 66.7 kg, SpO2 97 %. Physical Exam: Pleasant white female no acute distress Abdomen is soft with an ileostomy present on the right side of the abdomen.  A midline incision is present with a modified wound VAC present.  A JP is present in the right lower quadrant which was removed.  Assessment/Plan: Impression: Fevers, PICC line in place for 5 months Plan: We will get blood cultures through the PICC line as well as removal of the PICC line and culture of the tip.  We will place her on Bactrim DS 1 tablet p.o. twice daily x10 days.  I told her she may take full liquids and I will give her a call with the results of the cultures.  05/10/2020, 1:38 PM

## 2020-05-11 DIAGNOSIS — K632 Fistula of intestine: Secondary | ICD-10-CM | POA: Diagnosis not present

## 2020-05-11 DIAGNOSIS — K651 Peritoneal abscess: Secondary | ICD-10-CM | POA: Diagnosis not present

## 2020-05-11 DIAGNOSIS — K631 Perforation of intestine (nontraumatic): Secondary | ICD-10-CM | POA: Diagnosis not present

## 2020-05-12 DIAGNOSIS — K651 Peritoneal abscess: Secondary | ICD-10-CM | POA: Diagnosis not present

## 2020-05-12 DIAGNOSIS — K632 Fistula of intestine: Secondary | ICD-10-CM | POA: Diagnosis not present

## 2020-05-12 DIAGNOSIS — K631 Perforation of intestine (nontraumatic): Secondary | ICD-10-CM | POA: Diagnosis not present

## 2020-05-13 DIAGNOSIS — K631 Perforation of intestine (nontraumatic): Secondary | ICD-10-CM | POA: Diagnosis not present

## 2020-05-13 DIAGNOSIS — K651 Peritoneal abscess: Secondary | ICD-10-CM | POA: Diagnosis not present

## 2020-05-13 DIAGNOSIS — K632 Fistula of intestine: Secondary | ICD-10-CM | POA: Diagnosis not present

## 2020-05-14 ENCOUNTER — Inpatient Hospital Stay (HOSPITAL_COMMUNITY)
Admission: EM | Admit: 2020-05-14 | Discharge: 2020-05-21 | DRG: 683 | Disposition: A | Discharge status: Home Health | Payer: BC Managed Care – PPO | Attending: Internal Medicine | Admitting: Internal Medicine

## 2020-05-14 ENCOUNTER — Other Ambulatory Visit: Payer: Self-pay

## 2020-05-14 ENCOUNTER — Encounter (HOSPITAL_COMMUNITY): Payer: Self-pay

## 2020-05-14 ENCOUNTER — Emergency Department (HOSPITAL_COMMUNITY): Payer: BC Managed Care – PPO

## 2020-05-14 DIAGNOSIS — F419 Anxiety disorder, unspecified: Secondary | ICD-10-CM | POA: Diagnosis not present

## 2020-05-14 DIAGNOSIS — Z20822 Contact with and (suspected) exposure to covid-19: Secondary | ICD-10-CM | POA: Diagnosis present

## 2020-05-14 DIAGNOSIS — M47814 Spondylosis without myelopathy or radiculopathy, thoracic region: Secondary | ICD-10-CM | POA: Diagnosis not present

## 2020-05-14 DIAGNOSIS — I10 Essential (primary) hypertension: Secondary | ICD-10-CM | POA: Diagnosis present

## 2020-05-14 DIAGNOSIS — E876 Hypokalemia: Secondary | ICD-10-CM | POA: Diagnosis not present

## 2020-05-14 DIAGNOSIS — Z96651 Presence of right artificial knee joint: Secondary | ICD-10-CM | POA: Diagnosis present

## 2020-05-14 DIAGNOSIS — K219 Gastro-esophageal reflux disease without esophagitis: Secondary | ICD-10-CM | POA: Diagnosis not present

## 2020-05-14 DIAGNOSIS — E440 Moderate protein-calorie malnutrition: Secondary | ICD-10-CM | POA: Diagnosis present

## 2020-05-14 DIAGNOSIS — E669 Obesity, unspecified: Secondary | ICD-10-CM | POA: Diagnosis not present

## 2020-05-14 DIAGNOSIS — E878 Other disorders of electrolyte and fluid balance, not elsewhere classified: Secondary | ICD-10-CM | POA: Diagnosis present

## 2020-05-14 DIAGNOSIS — E872 Acidosis, unspecified: Secondary | ICD-10-CM | POA: Diagnosis present

## 2020-05-14 DIAGNOSIS — Z933 Colostomy status: Secondary | ICD-10-CM

## 2020-05-14 DIAGNOSIS — E861 Hypovolemia: Secondary | ICD-10-CM | POA: Diagnosis not present

## 2020-05-14 DIAGNOSIS — E871 Hypo-osmolality and hyponatremia: Secondary | ICD-10-CM | POA: Diagnosis not present

## 2020-05-14 DIAGNOSIS — Z888 Allergy status to other drugs, medicaments and biological substances status: Secondary | ICD-10-CM | POA: Diagnosis not present

## 2020-05-14 DIAGNOSIS — Z885 Allergy status to narcotic agent status: Secondary | ICD-10-CM | POA: Diagnosis not present

## 2020-05-14 DIAGNOSIS — Z6826 Body mass index (BMI) 26.0-26.9, adult: Secondary | ICD-10-CM | POA: Diagnosis not present

## 2020-05-14 DIAGNOSIS — R1111 Vomiting without nausea: Secondary | ICD-10-CM | POA: Diagnosis not present

## 2020-05-14 DIAGNOSIS — Z794 Long term (current) use of insulin: Secondary | ICD-10-CM

## 2020-05-14 DIAGNOSIS — K632 Fistula of intestine: Secondary | ICD-10-CM | POA: Diagnosis present

## 2020-05-14 DIAGNOSIS — E860 Dehydration: Secondary | ICD-10-CM | POA: Diagnosis not present

## 2020-05-14 DIAGNOSIS — Z932 Ileostomy status: Secondary | ICD-10-CM | POA: Diagnosis not present

## 2020-05-14 DIAGNOSIS — I959 Hypotension, unspecified: Secondary | ICD-10-CM | POA: Diagnosis not present

## 2020-05-14 DIAGNOSIS — R6510 Systemic inflammatory response syndrome (SIRS) of non-infectious origin without acute organ dysfunction: Secondary | ICD-10-CM | POA: Diagnosis not present

## 2020-05-14 DIAGNOSIS — K631 Perforation of intestine (nontraumatic): Secondary | ICD-10-CM | POA: Diagnosis not present

## 2020-05-14 DIAGNOSIS — K760 Fatty (change of) liver, not elsewhere classified: Secondary | ICD-10-CM | POA: Diagnosis not present

## 2020-05-14 DIAGNOSIS — Z79899 Other long term (current) drug therapy: Secondary | ICD-10-CM | POA: Diagnosis not present

## 2020-05-14 DIAGNOSIS — Z9049 Acquired absence of other specified parts of digestive tract: Secondary | ICD-10-CM

## 2020-05-14 DIAGNOSIS — Z9889 Other specified postprocedural states: Secondary | ICD-10-CM | POA: Diagnosis not present

## 2020-05-14 DIAGNOSIS — K651 Peritoneal abscess: Secondary | ICD-10-CM | POA: Diagnosis not present

## 2020-05-14 DIAGNOSIS — R109 Unspecified abdominal pain: Secondary | ICD-10-CM | POA: Diagnosis not present

## 2020-05-14 DIAGNOSIS — R079 Chest pain, unspecified: Secondary | ICD-10-CM | POA: Diagnosis not present

## 2020-05-14 DIAGNOSIS — N179 Acute kidney failure, unspecified: Principal | ICD-10-CM | POA: Diagnosis present

## 2020-05-14 DIAGNOSIS — R000 Tachycardia, unspecified: Secondary | ICD-10-CM | POA: Diagnosis not present

## 2020-05-14 DIAGNOSIS — T368X5A Adverse effect of other systemic antibiotics, initial encounter: Secondary | ICD-10-CM | POA: Diagnosis present

## 2020-05-14 LAB — COMPREHENSIVE METABOLIC PANEL
ALT: 59 U/L — ABNORMAL HIGH (ref 0–44)
AST: 84 U/L — ABNORMAL HIGH (ref 15–41)
Albumin: 3.5 g/dL (ref 3.5–5.0)
Alkaline Phosphatase: 303 U/L — ABNORMAL HIGH (ref 38–126)
Anion gap: 22 — ABNORMAL HIGH (ref 5–15)
BUN: 65 mg/dL — ABNORMAL HIGH (ref 6–20)
CO2: 21 mmol/L — ABNORMAL LOW (ref 22–32)
Calcium: 8.7 mg/dL — ABNORMAL LOW (ref 8.9–10.3)
Chloride: 80 mmol/L — ABNORMAL LOW (ref 98–111)
Creatinine, Ser: 5.3 mg/dL — ABNORMAL HIGH (ref 0.44–1.00)
GFR, Estimated: 9 mL/min — ABNORMAL LOW (ref >60)
Glucose, Bld: 176 mg/dL — ABNORMAL HIGH (ref 70–99)
Potassium: 3.6 mmol/L (ref 3.5–5.1)
Sodium: 123 mmol/L — ABNORMAL LOW (ref 135–145)
Total Bilirubin: 1 mg/dL (ref 0.3–1.2)
Total Protein: 10.4 g/dL — ABNORMAL HIGH (ref 6.5–8.1)

## 2020-05-14 LAB — CBC WITH DIFFERENTIAL/PLATELET
Abs Immature Granulocytes: 0.16 10*3/uL — ABNORMAL HIGH (ref 0.00–0.07)
Basophils Absolute: 0.1 10*3/uL (ref 0.0–0.1)
Basophils Relative: 0 %
Eosinophils Absolute: 0 10*3/uL (ref 0.0–0.5)
Eosinophils Relative: 0 %
HCT: 39.7 % (ref 36.0–46.0)
Hemoglobin: 13.2 g/dL (ref 12.0–15.0)
Immature Granulocytes: 1 %
Lymphocytes Relative: 14 %
Lymphs Abs: 2 10*3/uL (ref 0.7–4.0)
MCH: 27.4 pg (ref 26.0–34.0)
MCHC: 33.2 g/dL (ref 30.0–36.0)
MCV: 82.5 fL (ref 80.0–100.0)
Monocytes Absolute: 1.4 10*3/uL — ABNORMAL HIGH (ref 0.1–1.0)
Monocytes Relative: 10 %
Neutro Abs: 10.8 10*3/uL — ABNORMAL HIGH (ref 1.7–7.7)
Neutrophils Relative %: 75 %
Platelets: 558 10*3/uL — ABNORMAL HIGH (ref 150–400)
RBC: 4.81 MIL/uL (ref 3.87–5.11)
RDW: 13.6 % (ref 11.5–15.5)
WBC: 14.4 10*3/uL — ABNORMAL HIGH (ref 4.0–10.5)
nRBC: 0 % (ref 0.0–0.2)

## 2020-05-14 LAB — HIV ANTIBODY (ROUTINE TESTING W REFLEX): HIV Screen 4th Generation wRfx: NONREACTIVE

## 2020-05-14 LAB — APTT: aPTT: 39 seconds — ABNORMAL HIGH (ref 24–36)

## 2020-05-14 LAB — PROTIME-INR
INR: 1.1 (ref 0.8–1.2)
Prothrombin Time: 13.4 seconds (ref 11.4–15.2)

## 2020-05-14 LAB — RESP PANEL BY RT-PCR (FLU A&B, COVID) ARPGX2
Influenza A by PCR: NEGATIVE
Influenza B by PCR: NEGATIVE
SARS Coronavirus 2 by RT PCR: NEGATIVE

## 2020-05-14 LAB — MAGNESIUM: Magnesium: 2.1 mg/dL (ref 1.7–2.4)

## 2020-05-14 LAB — LACTIC ACID, PLASMA
Lactic Acid, Venous: 3.4 mmol/L (ref 0.5–1.9)
Lactic Acid, Venous: 4.1 mmol/L (ref 0.5–1.9)
Lactic Acid, Venous: 2.6 mmol/L (ref 0.5–1.9)
Lactic Acid, Venous: 1.4 mmol/L (ref 0.5–1.9)

## 2020-05-14 LAB — CK: Total CK: 39 U/L (ref 38–234)

## 2020-05-14 MED ORDER — PANTOPRAZOLE SODIUM 40 MG PO TBEC
40.0000 mg | DELAYED_RELEASE_TABLET | Freq: Every day | ORAL | Status: DC
  Administered 2020-05-15: 40 mg via ORAL
  Filled 2020-05-14: qty 1

## 2020-05-14 MED ORDER — CHLORHEXIDINE GLUCONATE CLOTH 2 % EX PADS
6.0000 | MEDICATED_PAD | Freq: Every day | CUTANEOUS | Status: DC
  Administered 2020-05-14 – 2020-05-21 (×7): 6 via TOPICAL

## 2020-05-14 MED ORDER — SODIUM CHLORIDE 0.9 % IV SOLN: 2.0000 g | Freq: Once | INTRAVENOUS | Status: DC

## 2020-05-14 MED ORDER — SODIUM CHLORIDE 0.9 % IV SOLN
INTRAVENOUS | Status: AC
  Filled 2020-05-14: qty 8

## 2020-05-14 MED ORDER — SODIUM CHLORIDE 0.9 % IV SOLN
3.0000 g | INTRAVENOUS | Status: DC
  Administered 2020-05-14 – 2020-05-15 (×2): 3 g via INTRAVENOUS
  Filled 2020-05-14 (×2): qty 8
  Filled 2020-05-14: qty 3

## 2020-05-14 MED ORDER — HYDROMORPHONE HCL 1 MG/ML IJ SOLN
0.5000 mg | INTRAMUSCULAR | Status: DC | PRN
  Administered 2020-05-15 – 2020-05-17 (×7): 0.5 mg via INTRAVENOUS
  Administered 2020-05-18 – 2020-05-21 (×8): 1 mg via INTRAVENOUS
  Filled 2020-05-14 (×15): qty 1

## 2020-05-14 MED ORDER — LACTATED RINGERS IV BOLUS (SEPSIS)
1000.0000 mL | Freq: Once | INTRAVENOUS | Status: AC
  Administered 2020-05-14: 1000 mL via INTRAVENOUS

## 2020-05-14 MED ORDER — HEPARIN SODIUM (PORCINE) 5000 UNIT/ML IJ SOLN
5000.0000 [IU] | Freq: Three times a day (TID) | INTRAMUSCULAR | Status: DC
  Administered 2020-05-14 – 2020-05-21 (×21): 5000 [IU] via SUBCUTANEOUS
  Filled 2020-05-14 (×20): qty 1

## 2020-05-14 MED ORDER — LACTATED RINGERS IV BOLUS
1000.0000 mL | Freq: Once | INTRAVENOUS | Status: AC
  Administered 2020-05-14: 1000 mL via INTRAVENOUS

## 2020-05-14 MED ORDER — PIPERACILLIN-TAZOBACTAM 3.375 G IVPB
3.3750 g | Freq: Two times a day (BID) | INTRAVENOUS | Status: DC
  Administered 2020-05-14: 3.375 g via INTRAVENOUS
  Filled 2020-05-14: qty 50

## 2020-05-14 MED ORDER — PIPERACILLIN-TAZOBACTAM 3.375 G IVPB 30 MIN
3.3750 g | Freq: Once | INTRAVENOUS | Status: AC
  Administered 2020-05-14: 3.375 g via INTRAVENOUS
  Filled 2020-05-14: qty 50

## 2020-05-14 MED ORDER — HYDROCODONE-ACETAMINOPHEN 5-325 MG PO TABS
1.0000 | ORAL_TABLET | ORAL | Status: DC | PRN
  Administered 2020-05-14 – 2020-05-15 (×3): 1 via ORAL
  Filled 2020-05-14 (×3): qty 1

## 2020-05-14 MED ORDER — THIAMINE HCL 100 MG PO TABS
100.0000 mg | ORAL_TABLET | Freq: Every day | ORAL | Status: DC
  Administered 2020-05-15: 100 mg via ORAL
  Filled 2020-05-14: qty 1

## 2020-05-14 MED ORDER — VANCOMYCIN HCL IN DEXTROSE 1-5 GM/200ML-% IV SOLN: 1000.0000 mg | Freq: Once | INTRAVENOUS | Status: DC

## 2020-05-14 MED ORDER — ONDANSETRON HCL 4 MG/2ML IJ SOLN
4.0000 mg | Freq: Four times a day (QID) | INTRAMUSCULAR | Status: DC | PRN
  Administered 2020-05-14 – 2020-05-19 (×3): 4 mg via INTRAVENOUS
  Filled 2020-05-14 (×3): qty 2

## 2020-05-14 MED ORDER — ZINC SULFATE 220 (50 ZN) MG PO CAPS
220.0000 mg | ORAL_CAPSULE | Freq: Every day | ORAL | Status: DC
  Administered 2020-05-15: 220 mg via ORAL
  Filled 2020-05-14: qty 1

## 2020-05-14 MED ORDER — FENTANYL CITRATE (PF) 100 MCG/2ML IJ SOLN
50.0000 ug | Freq: Once | INTRAMUSCULAR | Status: AC
  Administered 2020-05-14: 50 ug via INTRAVENOUS
  Filled 2020-05-14: qty 2

## 2020-05-14 MED ORDER — LACTATED RINGERS IV SOLN: INTRAVENOUS | Status: DC

## 2020-05-14 MED ORDER — VANCOMYCIN HCL 1250 MG/250ML IV SOLN
1250.0000 mg | Freq: Once | INTRAVENOUS | Status: AC
  Administered 2020-05-14: 1250 mg via INTRAVENOUS
  Filled 2020-05-14: qty 250

## 2020-05-14 NOTE — Sepsis Progress Note (Signed)
elink monitoring sepsis protocol.  

## 2020-05-14 NOTE — Progress Notes (Signed)
Pharmacy Antibiotic Note  [name] is a 60 y.o. female admitted on 05/14/2020 with sepsis.  Pharmacy has been consulted for Zosyn and Vancomycin dosing. Patient presented a few days ago with Fevers with IV TPN. PICC line removed 3/18 which she had for 5 months post perforated appendicitis. Patient now with generalized body aches but no fever.   Plan: Vancomycin 1250mg  IV x 1 now, ( Scr 0.4> 5.3), will draw random level in 48 hours with ARF; follow up further dosing Zosyn 3.375g IV now on 30 min, then 3.375gm IV q12h EID over 4 hours F/U Cxs and clinical progress Monitor V/S, labs and levels as indicated  Height: 5\' 1"  (154.9 cm) Weight: 62.6 kg (138 lb) IBW/kg (Calculated) : 47.8  Temp (24hrs), Avg:97.6 F (36.4 C), Min:97.6 F (36.4 C), Max:97.6 F (36.4 C)  Recent Labs  Lab 05/14/20 0750 05/14/20 0949 05/14/20 1150  WBC 14.4*  --   --   CREATININE 5.30*  --   --   LATICACIDVEN 3.4* 4.1* 2.6*    Estimated Creatinine Clearance: 9.7 mL/min (A) (by C-G formula based on SCr of 5.3 mg/dL (H)).    Allergies  Allergen Reactions  . 5-Alpha Reductase Inhibitors   . Maxipime [Cefepime]     Hives    . Metronidazole     hives  . Acetaminophen-Codeine Rash    Tylenol with Codeine Tylenol #3,    Antimicrobials this admission: Vancomycin 3/22 >> Zosyn 3/22 >>   Dose adjustments this admission: prn  Microbiology results: 3/22 BCx: pending 3/22 UCx: pending  MRSA PCR:   Thank you for allowing pharmacy to be a part of this patient's care.  4/22, BS 4/22, Elder Cyphers Clinical Pharmacist Pager [phone] 05/14/2020 12:50 PM

## 2020-05-14 NOTE — ED Provider Notes (Signed)
[facility] EMERGENCY DEPARTMENT Provider Note   CSN: [id number] Arrival date & time: 05/14/20  8657     History Chief Complaint  Patient presents with  . Generalized Body Aches    [name] is a 60 y.o. female.  HPI 60 year old female presents with vomiting and body aches.  She had her PICC line removed on 3/18.  Since then she has noticed some increased output out of her drain.  She has also been having diffuse body cramps that seem to wax and wane over the last couple days.  She has not had a fever.  She has had some vomiting.  She has abdominal pain with the cramping but not specifically different than the cramping. She feels like the left sided drain is draining more than she can keep up with since the PICC line was removed. She has not urinated since yesterday morning. She has been taking the bactrim and doesn't think she has vomited it back up.   Past Medical History:  Diagnosis Date  . Anxiety   . Arthritis   . Colon perforation (HCC)    due to appendicitis  . GERD (gastroesophageal reflux disease)   . Hypertension   . S/P endoscopy    2008: noncritical Schatzki ring s/p dilation, normal esophagus and stomach, 2003: normal esophagus, s/p Maloney dilation, multiple antral erosions consistent with chronic gastritis, no H.pylori,     Patient Active Problem List   Diagnosis Date Noted  . Acute renal failure (ARF) (HCC) 05/14/2020  . PICC line infection, initial encounter   . 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 Small bowel perforation (HCC)   . S/P partial colectomy 12/04/2019  . Peritonitis due to abscess (HCC)   . Colon perforation (HCC)   . 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 Primary osteoarthritis of right knee   . Chest pain 03/02/2018  . Acquired trigger finger of right middle finger   . Knee pain, right 04/06/2015  . Dysphagia 10/09/2010  . GERD (gastroesophageal reflux disease) 10/09/2010  . Encounter for screening colonoscopy 10/09/2010     Past Surgical History:  Procedure Laterality Date  . APPENDECTOMY    . BOWEL RESECTION N/A 12/06/2019   Procedure: PARTIAL SMALL BOWEL RESECTION WITH ILEOSTOMY;  Surgeon: Franky Macho, MD;  Location: [facility];  Service: General;  Laterality: N/A;  . CENTRAL LINE INSERTION N/A 12/06/2019   Procedure: CENTRAL LINE INSERTION;  Surgeon: Franky Macho, MD;  Location: [facility];  Service: General;  Laterality: N/A;  . CESAREAN SECTION    . CHOLECYSTECTOMY    . COLONOSCOPY  10/17/2010   normal   . ESOPHAGOGASTRODUODENOSCOPY  10/17/2010   non-critical Schatzki's ring s/p dilation, patulous EG junction, small hiatal hernia, otherwise normal stomach, first and second portion of duodenum  . ESOPHAGOGASTRODUODENOSCOPY N/A 10/26/2018   Procedure: ESOPHAGOGASTRODUODENOSCOPY (EGD);  Surgeon: Corbin Ade, MD;  Location: [facility];  Service: Endoscopy;  Laterality: N/A;  9:15am  . FOOT SURGERY Right    heel spur  . HAND SURGERY Left    carpal tunnel  . HERNIA REPAIR N/A    approx.    10 or 12 years ago  . KNEE ARTHROSCOPY WITH MEDIAL MENISECTOMY  01/07/2012   Procedure: KNEE ARTHROSCOPY WITH MEDIAL MENISECTOMY;  Surgeon: Darreld Mclean, MD;  Location: [facility];  Service: Orthopedics;  Laterality: Right;  . LAPAROTOMY N/A 12/04/2019   Procedure: EXPLORATORY LAPAROTOMY;  Surgeon: Franky Macho, MD;  Location: [facility];  Service: General;  Laterality: N/A;  .  LAPAROTOMY N/A 12/06/2019   Procedure: EXPLORATORY LAPAROTOMY;  Surgeon: Franky Macho, MD;  Location: [facility];  Service: General;  Laterality: N/A;  [name] DILATION  10/17/2010   Procedure: Alvy Beal;  Surgeon: Corbin Ade, MD;  Location: [facility];  Service: Endoscopy;  Laterality: N/A;  . [name] DILATION N/A 10/26/2018   Procedure: [name] DILATION;  Surgeon: Corbin Ade, MD;  Location: [facility];  Service: Endoscopy;  Laterality: N/A;  . PARTIAL COLECTOMY Right 12/04/2019   Procedure: RIGHT HEMI COLECTOMY WITH TERMINAL  ILEUM RESECTION;  Surgeon: Franky Macho, MD;  Location: [facility];  Service: General;  Laterality: Right;  . TOTAL KNEE ARTHROPLASTY Right 04/18/2019   Procedure: RIGHT TOTAL KNEE ARTHROPLASTY;  Surgeon: Vickki Hearing, MD;  Location: [facility];  Service: Orthopedics;  Laterality: Right;  . TRIGGER FINGER RELEASE Right 08/20/2016   Procedure: RELEASE A-1 PULLEY RIGHT LONG FINGER;  Surgeon: Vickki Hearing, MD;  Location: [facility];  Service: Orthopedics;  Laterality: Right;     OB History   No obstetric history on file.     Family History  Problem Relation Age of Onset  . Cancer Father        esophagus ca, living  . Colon cancer Paternal Uncle        in 56s, early 8s  . Colon polyps Neg Hx     Social History   Tobacco Use  . Smoking status: Never Smoker  . Smokeless tobacco: Never Used  Vaping Use  . Vaping Use: Never used  Substance Use Topics  . Alcohol use: Yes    Comment: drinks gin on weekends   . Drug use: No    Home Medications Prior to Admission medications   Medication Sig Start Date End Date Taking? Authorizing Provider  HM LIDOCAINE PATCH EX Apply topically.    [provider]  HYDROcodone-acetaminophen (NORCO/VICODIN) 5-325 MG tablet Take 1 tablet by mouth every 4 (four) hours as needed for severe pain. 05/06/20 05/06/21  Lucretia Roers, MD  insulin lispro (HUMALOG) 100 UNIT/ML injection Sliding scale    [provider]  pantoprazole (PROTONIX) 40 MG tablet Take 40 mg by mouth daily.    [provider]  sulfamethoxazole-trimethoprim (BACTRIM DS) 800-160 MG tablet Take 1 tablet by mouth 2 (two) times daily for 10 days. 05/10/20 05/20/20  Harlene Salts A, PA-C  thiamine 100 MG tablet Take 100 mg by mouth daily.    [provider]  Vitamin D, Cholecalciferol, 10 MCG (400 UNIT) CAPS Take by mouth.    [provider]  zinc sulfate 220 (50 Zn) MG capsule Take 220 mg by mouth daily.    [provider]     Allergies    5-alpha reductase inhibitors, Maxipime [cefepime], Metronidazole, and Acetaminophen-codeine  Review of Systems   Review of Systems  Constitutional: Positive for fatigue. Negative for fever.  Gastrointestinal: Positive for abdominal pain and vomiting.  Musculoskeletal: Positive for myalgias.  All other systems reviewed and are negative.   Physical Exam Updated Vital Signs BP 99/62   Pulse 85   Temp 97.6 F (36.4 C) (Oral)   Resp 16   Ht  (1.549 m)   Wt 62.6 kg   SpO2 99%   BMI 26.07 kg/m   Physical Exam Vitals and nursing note reviewed.  Constitutional:      Appearance: She is well-developed. She is not diaphoretic.  HENT:     Head: Normocephalic and atraumatic.  Right Ear: External ear normal.     Left Ear: External ear normal.     Nose: Nose normal.  Eyes:     General:        Right eye: No discharge.        Left eye: No discharge.  Cardiovascular:     Rate and Rhythm: Regular rhythm. Tachycardia present.     Heart sounds: Normal heart sounds.  Pulmonary:     Effort: Pulmonary effort is normal.     Breath sounds: Normal breath sounds.  Abdominal:     General: There is no distension.     Palpations: Abdomen is soft.     Tenderness: There is no abdominal tenderness.     Comments: Right sided ileostomy with small amount of output Drain left of midline with drainage without blood  Skin:    General: Skin is warm and dry.  Neurological:     Mental Status: She is alert.  Psychiatric:        Mood and Affect: Mood is not anxious.     ED Results / Procedures / Treatments   Labs (all labs ordered are listed, but only abnormal results are displayed) Labs Reviewed  LACTIC ACID, PLASMA - Abnormal; Notable for the following components:      Result Value   Lactic Acid, Venous 3.4 (*)    All other components within normal limits  LACTIC ACID, PLASMA - Abnormal; Notable for the following components:   Lactic Acid, Venous 4.1 (*)    All  other components within normal limits  COMPREHENSIVE METABOLIC PANEL - Abnormal; Notable for the following components:   Sodium 123 (*)    Chloride 80 (*)    CO2 21 (*)    Glucose, Bld 176 (*)    BUN 65 (*)    Creatinine, Ser 5.30 (*)    Calcium 8.7 (*)    Total Protein 10.4 (*)    AST 84 (*)    ALT 59 (*)    Alkaline Phosphatase 303 (*)    GFR, Estimated 9 (*)    Anion gap 22 (*)    All other components within normal limits  CBC WITH DIFFERENTIAL/PLATELET - Abnormal; Notable for the following components:   WBC 14.4 (*)    Platelets 558 (*)    Neutro Abs 10.8 (*)    Monocytes Absolute 1.4 (*)    Abs Immature Granulocytes 0.16 (*)    All other components within normal limits  APTT - Abnormal; Notable for the following components:   aPTT 39 (*)    All other components within normal limits  LACTIC ACID, PLASMA - Abnormal; Notable for the following components:   Lactic Acid, Venous 2.6 (*)    All other components within normal limits  CULTURE, BLOOD (ROUTINE X 2)  CULTURE, BLOOD (ROUTINE X 2)  RESP PANEL BY RT-PCR (FLU A&B, COVID) ARPGX2  URINE CULTURE  CK  PROTIME-INR  MAGNESIUM  LACTIC ACID, PLASMA  URINALYSIS, ROUTINE W REFLEX MICROSCOPIC    EKG EKG Interpretation  Date/Time:  Tuesday May 14 2020 07:59:14 EDT Ventricular Rate:  116 PR Interval:    QRS Duration: 87 QT Interval:  352 QTC Calculation: 489 R Axis:   -15 Text Interpretation: Sinus tachycardia Borderline left axis deviation Low voltage, precordial leads Abnormal inferior Q waves Borderline prolonged QT interval Baseline wander in lead(s) V3 Confirmed by Pricilla Loveless [phone]) on 05/14/2020 8:08:54 AM   Radiology CT ABDOMEN PELVIS WO CONTRAST  Result Date: 05/14/2020 CLINICAL  DATA:  Right-sided abdominal pain for several months. Acute onset nausea and vomiting yesterday. Several months postop from right colectomy and distal small bowel resection. EXAM: CT ABDOMEN AND PELVIS WITHOUT CONTRAST  TECHNIQUE: Multidetector CT imaging of the abdomen and pelvis was performed following the standard protocol without IV contrast. COMPARISON:  05/03/2020 FINDINGS: Lower chest: An ill-defined focal nodular opacity is seen in the anterior left lower lobe which measures 15 mm and is new since previous study. Hepatobiliary: Heterogeneous pattern of steatosis is again seen. No focal mass visualized on this unenhanced exam. Prior cholecystectomy. No evidence of biliary obstruction. Pancreas: No mass or inflammatory process visualized on this unenhanced exam. Spleen:  Within normal limits in size. Adrenals/Urinary tract: No evidence of urolithiasis or hydronephrosis. Unremarkable unopacified urinary bladder. Stomach/Bowel: Postop changes from right colectomy and right lower quadrant ileostomy remains stable. No evidence of bowel obstruction, abscess, or acute inflammatory process. Possible umbilical fistula is again seen with several matted small bowel loops just deep to the anterior abdominal wall at the umbilicus. Fluid and gas are noted within the umbilical soft tissues. No evidence of hernia. Vascular/Lymphatic: No pathologically enlarged lymph nodes identified. No evidence of abdominal aortic aneurysm. Reproductive:  No mass or other significant abnormality. Other:  None. Musculoskeletal:  No suspicious bone lesions identified. IMPRESSION: No evidence of bowel obstruction or abscess. 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 No evidence of hernia. Stable hepatic steatosis. New ill-defined nodular opacity in anterior left lower lobe. Differential diagnosis includes inflammatory or infectious etiologies, with neoplasm considered less likely. Recommend follow-up by chest CT in 2-3 months. Electronically Signed   By: Danae Orleans M.D.   On: 05/14/2020 10:41   DG Chest Port 1 View  Result Date: 05/14/2020 CLINICAL DATA:  Pain EXAM: PORTABLE CHEST 1 VIEW COMPARISON:   February 01, 2020 FINDINGS: Lungs are clear. Heart size and pulmonary vascularity are normal. No adenopathy. There is degenerative change in the thoracic spine and in each shoulder. IMPRESSION: Lungs clear.  Cardiac silhouette normal. Electronically Signed   By: Bretta Bang III M.D.   On: 05/14/2020 08:16    Procedures .Critical Care Performed by: Pricilla Loveless, MD Authorized by: Pricilla Loveless, MD   Critical care provider statement:    Critical care time (minutes):  45   Critical care time was exclusive of:  Separately billable procedures and treating other patients   Critical care was necessary to treat or prevent imminent or life-threatening deterioration of the following conditions:  Renal failure   Critical care was time spent personally by me on the following activities:  Discussions with consultants, evaluation of patient's response to treatment, examination of patient, ordering and performing treatments and interventions, ordering and review of laboratory studies, ordering and review of radiographic studies, pulse oximetry, re-evaluation of patient's condition, obtaining history from patient or surrogate and review of old charts     Medications Ordered in ED Medications  lactated ringers infusion (has no administration in time range)  piperacillin-tazobactam (ZOSYN) IVPB 3.375 g (has no administration in time range)  fentaNYL (SUBLIMAZE) injection 50 mcg (50 mcg Intravenous Given 05/14/20 0801)  lactated ringers bolus 1,000 mL (0 mLs Intravenous Stopped 05/14/20 1454)  lactated ringers bolus 1,000 mL (0 mLs Intravenous Stopped 05/14/20 1454)  vancomycin (VANCOREADY) IVPB 1250 mg/250 mL (0 mg Intravenous Stopped 05/14/20 1454)  piperacillin-tazobactam (ZOSYN) IVPB 3.375 g (0 g Intravenous Stopped 05/14/20 1055)    ED Course  I have reviewed the triage vital  signs and the nursing notes.  Pertinent labs & imaging results that were available during my care of the patient were  reviewed by me and considered in my medical decision making (see chart for details).  Clinical Course as of 05/14/20 1457  Tue May 14, 2020  [id number] Patient is afebrile, but she is tachycardic with some borderline low blood pressures.  While she has not had a specific fever at home, with her increased white blood cell count and lactic acid, I think it is reasonable to start antibiotics per sepsis protocol.  It could be that this is related to some dehydration, but she appears ill enough that I think antibiotics are warranted as we continue to work this up. [SG]    Clinical Course User Index [SG] Pricilla LovelessGoldston, Mahika Vanvoorhis, MD   MDM Rules/Calculators/A&P                          Patient was started on antibiotics per sepsis protocol given the white blood cell count and lactic, but is seeming more more like this is fluid loss causing dehydration/acute kidney injury.  Fortunately her CT does not show an emergent surgical condition though Dr. Lovell SheehanJenkins was made aware and has consulted.  She does have Pantoea that grew on her cath tip culture but she was on Bactrim and I am not sure this is causing septic shock.  At this point she will be given significant IV fluids and be admitted to the hospitalist service. Final Clinical Impression(s) / ED Diagnoses Final diagnoses:  Acute kidney injury [facility](HCC)    Rx / DC Orders ED Discharge Orders    None       Pricilla LovelessGoldston, Odette Watanabe, MD 05/14/20 1458

## 2020-05-14 NOTE — Progress Notes (Signed)
PHARMACY NOTE:  ANTIMICROBIAL RENAL DOSAGE ADJUSTMENT  Current antimicrobial regimen includes a mismatch between antimicrobial dosage and estimated renal function.  As per policy approved by the Pharmacy & Therapeutics and Medical Executive Committees, the antimicrobial dosage will be adjusted accordingly.  Current antimicrobial dosage:  Ampicllin/sulbactam 3 gm IV Q 6 hrs  Indication:  Intra-abdominal infection, Pantoea species growing from PICC tip  Renal Function:  Estimated Creatinine Clearance: 9.9 mL/min (A) (by C-G formula based on SCr of 5.3 mg/dL (H)). []      On intermittent HD, scheduled: []      On CRRT    Antimicrobial dosage has been changed to:  Ampicillin/sulbactam 3 gm IV Q 24 hrs  Additional comments:  Pharmacy will continue to monitor pt's renal function and adjust antibiotic dosing as needed.  Thank you for allowing pharmacy to be a part of this patient's care.  , PharmD, BCPS, [facility] Clinical Pharmacist 05/14/2020 6:23 PM

## 2020-05-14 NOTE — H&P (Signed)
History and Physical    [name] VQM:[id number] DOB: 1960-03-11 DOA: 05/14/2020  PCP: Avis Epley, PA-C   Patient coming from: home  I have personally briefly reviewed patient's old medical records in [facility]  Chief Complaint: generalized body aches, not feeling well, lightheadedness   HPI: [name] is a 60 y.o. female with medical history significant of history of perforated appendicitis with partial small bowel resection and ileostomy, gastroesophageal flux disease, hypertension and recent infected PICC line and fevers approximately 4 to 5 days prior to admission.  At that time PICC line was removed on culture, positive cath tip demonstrating pantoea species, patient was treated at time empirically with Bactrim.  Patient reports some chills at home general malaise and generalized body aches.  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  No cough, no dysuria, no hematuria, no blood in the stools.  Denies focal neurologic deficits.  Of note, patient expressed decreased in her urine output.  ED Course: Patient found with SIRS/early sepsis and acute renal failure.  Significantly dehydrated, with lactic acidosis, hyponatremia and hypochloremia.  Aggressive fluid resuscitation initiated, empirically given vancomycin and Zosyn after cultures taken.  General surgery has been consulted and [facility] contacted to place patient in the hospital for further evaluation and management.  Review of Systems: As per HPI otherwise all other systems reviewed and are negative.  Past Medical History:  Diagnosis Date  . Anxiety   . Arthritis   . Colon perforation (HCC)    due to appendicitis  . GERD (gastroesophageal reflux disease)   . Hypertension   . S/P endoscopy    2008: noncritical Schatzki ring s/p dilation, normal esophagus and stomach, 2003: normal esophagus, s/p Maloney dilation, multiple antral erosions consistent with  chronic gastritis, no H.pylori,     Past Surgical History:  Procedure Laterality Date  . APPENDECTOMY    . BOWEL RESECTION N/A 12/06/2019   Procedure: PARTIAL SMALL BOWEL RESECTION WITH ILEOSTOMY;  Surgeon: Franky Macho, MD;  Location: [facility];  Service: General;  Laterality: N/A;  . CENTRAL LINE INSERTION N/A 12/06/2019   Procedure: CENTRAL LINE INSERTION;  Surgeon: Franky Macho, MD;  Location: [facility];  Service: General;  Laterality: N/A;  . CESAREAN SECTION    . CHOLECYSTECTOMY    . COLONOSCOPY  10/17/2010   normal   . ESOPHAGOGASTRODUODENOSCOPY  10/17/2010   non-critical Schatzki's ring s/p dilation, patulous EG junction, small hiatal hernia, otherwise normal stomach, first and second portion of duodenum  . ESOPHAGOGASTRODUODENOSCOPY N/A 10/26/2018   Procedure: ESOPHAGOGASTRODUODENOSCOPY (EGD);  Surgeon: Corbin Ade, MD;  Location: [facility];  Service: Endoscopy;  Laterality: N/A;  9:15am  . FOOT SURGERY Right    heel spur  . HAND SURGERY Left    carpal tunnel  . HERNIA REPAIR N/A    approx.    10 or 12 years ago  . KNEE ARTHROSCOPY WITH MEDIAL MENISECTOMY  01/07/2012   Procedure: KNEE ARTHROSCOPY WITH MEDIAL MENISECTOMY;  Surgeon: Darreld Mclean, MD;  Location: [facility];  Service: Orthopedics;  Laterality: Right;  . LAPAROTOMY N/A 12/04/2019   Procedure: EXPLORATORY LAPAROTOMY;  Surgeon: Franky Macho, MD;  Location: [facility];  Service: General;  Laterality: N/A;  . LAPAROTOMY N/A 12/06/2019   Procedure: EXPLORATORY LAPAROTOMY;  Surgeon: Franky Macho, MD;  Location: [facility];  Service: General;  Laterality: N/A;  [name] DILATION  10/17/2010   Procedure: Alvy Beal;  Surgeon: Corbin Ade, MD;  [facility]  [facility];  Service: Endoscopy;  Laterality: N/A;  . Elease HashimotoMALONEY DILATION N/A 10/26/2018   Procedure: Elease HashimotoMALONEY DILATION;  Surgeon: Corbin Adeourk, Robert M, MD;  [facility];  Service: Endoscopy;  Laterality: N/A;  . PARTIAL COLECTOMY Right 12/04/2019   Procedure: RIGHT  HEMI COLECTOMY WITH TERMINAL ILEUM RESECTION;  Surgeon: Franky MachoJenkins, Mark, MD;  Location: [facility];  Service: General;  Laterality: Right;  . TOTAL KNEE ARTHROPLASTY Right 04/18/2019   Procedure: RIGHT TOTAL KNEE ARTHROPLASTY;  Surgeon: Vickki HearingHarrison, Stanley E, MD;  Location: [facility];  Service: Orthopedics;  Laterality: Right;  . TRIGGER FINGER RELEASE Right 08/20/2016   Procedure: RELEASE A-1 PULLEY RIGHT LONG FINGER;  Surgeon: Vickki HearingHarrison, Stanley E, MD;  Location: [facility];  Service: Orthopedics;  Laterality: Right;    Social History  reports that she has never smoked. She has never used smokeless tobacco. She reports current alcohol use. She reports that she does not use drugs.  Allergies  Allergen Reactions  . 5-Alpha Reductase Inhibitors   . Maxipime [Cefepime]     Hives    . Metronidazole     hives  . Acetaminophen-Codeine Rash    Tylenol with Codeine Tylenol #3,    Family History  Problem Relation Age of Onset  . Cancer Father        esophagus ca, living  . Colon cancer Paternal Uncle        in 5960s, early 1770s  . Colon polyps Neg Hx     Prior to Admission medications   Medication Sig Start Date End Date Taking? Authorizing Provider  HM LIDOCAINE PATCH EX Apply topically.    [provider]  HYDROcodone-acetaminophen (NORCO/VICODIN) 5-325 MG tablet Take 1 tablet by mouth every 4 (four) hours as needed for severe pain. 05/06/20 05/06/21  Lucretia RoersBridges, Lindsay C, MD  insulin lispro (HUMALOG) 100 UNIT/ML injection Sliding scale    [provider]  pantoprazole (PROTONIX) 40 MG tablet Take 40 mg by mouth daily.    [provider]  thiamine 100 MG tablet Take 100 mg by mouth daily.    [provider]  Vitamin D, Cholecalciferol, 10 MCG (400 UNIT) CAPS Take by mouth.    [provider]  zinc sulfate 220 (50 Zn) MG capsule Take 220 mg by mouth daily.    [provider]    Physical Exam: Vitals:   05/14/20 1030 05/14/20 1510 05/14/20 1644  05/14/20 1741  BP: 99/62   (!) 81/44  Pulse: 85   88  Resp: 16   14  Temp:  97.9 F (36.6 C) 97.6 F (36.4 C)   TempSrc:  Oral Oral   SpO2: 99%   98%  Weight:  65.4 kg    Height:  5\' 1"  (1.549 m)      Constitutional: No chest pain, currently without nausea or vomiting.  Afebrile. Vitals:   05/14/20 1030 05/14/20 1510 05/14/20 1644 05/14/20 1741  BP: 99/62   (!) 81/44  Pulse: 85   88  Resp: 16   14  Temp:  97.9 F (36.6 C) 97.6 F (36.4 C)   TempSrc:  Oral Oral   SpO2: 99%   98%  Weight:  65.4 kg    Height:  5\' 1"  (1.549 m)     Eyes: PERRL, lids and conjunctivae normal, no icterus ENMT: Mucous membranes are dry on examination. Posterior pharynx clear of any exudate or lesions. Neck: normal, supple, no masses, no thyromegaly Respiratory: clear to auscultation bilaterally, no wheezing, no crackles.  Normal respiratory effort. No accessory muscle use.  Cardiovascular: Sinus tachycardia, no rubs, no gallops, no JVD.   Abdomen: Positive bowel sounds bilaterally; no guarding.  Colostomy bag in place.  Patient expressed vague tenderness to palpation. Musculoskeletal: no cyanosis or clubbing; no lower extremity swelling. Skin: No petechiae.  Positive enterocutaneous fistula on change with active increased drainage.  Colostomy bag in place. Neurologic: CN 2-12 grossly intact. Sensation intact, DTR normal. Strength 5/5 in all 4.  Psychiatric: Normal judgment and insight. Alert and oriented x 3. Normal mood.    Labs on Admission: I have personally reviewed following labs and imaging studies  CBC: Recent Labs  Lab 05/14/20 0750  WBC 14.4*  NEUTROABS 10.8*  HGB 13.2  HCT 39.7  MCV 82.5  PLT 558*    Basic Metabolic Panel: Recent Labs  Lab 05/14/20 0750 05/14/20 0810  NA 123*  --   K 3.6  --   CL 80*  --   CO2 21*  --   GLUCOSE 176*  --   BUN 65*  --   CREATININE 5.30*  --   CALCIUM 8.7*  --   MG  --  2.1    GFR: Estimated Creatinine Clearance: 9.9 mL/min (A) (by  C-G formula based on SCr of 5.3 mg/dL (H)).  Liver Function Tests: Recent Labs  Lab 05/14/20 0750  AST 84*  ALT 59*  ALKPHOS 303*  BILITOT 1.0  PROT 10.4*  ALBUMIN 3.5    Urine analysis:    Component Value Date/Time   COLORURINE AMBER (A) 01/08/2020 1600   APPEARANCEUR CLEAR 01/08/2020 1600   LABSPEC 1.033 (H) 01/08/2020 1600   PHURINE 5.0 01/08/2020 1600   GLUCOSEU NEGATIVE 01/08/2020 1600   HGBUR NEGATIVE 01/08/2020 1600   BILIRUBINUR NEGATIVE 01/08/2020 1600   KETONESUR NEGATIVE 01/08/2020 1600   PROTEINUR 30 (A) 01/08/2020 1600   UROBILINOGEN 0.2 01/05/2012 0854   NITRITE NEGATIVE 01/08/2020 1600   LEUKOCYTESUR NEGATIVE 01/08/2020 1600    Radiological Exams on Admission: CT ABDOMEN PELVIS WO CONTRAST  Result Date: 05/14/2020 CLINICAL DATA:  Right-sided abdominal pain for several months. Acute onset nausea and vomiting yesterday. Several months postop from right colectomy and distal small bowel resection. EXAM: CT ABDOMEN AND PELVIS WITHOUT CONTRAST TECHNIQUE: Multidetector CT imaging of the abdomen and pelvis was performed following the standard protocol without IV contrast. COMPARISON:  05/03/2020 FINDINGS: Lower chest: An ill-defined focal nodular opacity is seen in the anterior left lower lobe which measures 15 mm and is new since previous study. Hepatobiliary: Heterogeneous pattern of steatosis is again seen. No focal mass visualized on this unenhanced exam. Prior cholecystectomy. No evidence of biliary obstruction. Pancreas: No mass or inflammatory process visualized on this unenhanced exam. Spleen:  Within normal limits in size. Adrenals/Urinary tract: No evidence of urolithiasis or hydronephrosis. Unremarkable unopacified urinary bladder. Stomach/Bowel: Postop changes from right colectomy and right lower quadrant ileostomy remains stable. No evidence of bowel obstruction, abscess, or acute inflammatory process. Possible umbilical fistula is again seen with several matted  small bowel loops just deep to the anterior abdominal wall at the umbilicus. Fluid and gas are noted within the umbilical soft tissues. No evidence of hernia. Vascular/Lymphatic: No pathologically enlarged lymph nodes identified. No evidence of abdominal aortic aneurysm. Reproductive:  No mass or other significant abnormality. Other:  None. Musculoskeletal:  No suspicious bone lesions identified. IMPRESSION: No evidence of bowel obstruction or abscess. 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 No evidence of hernia.  Stable hepatic steatosis. New ill-defined nodular opacity in anterior left lower lobe. Differential diagnosis includes inflammatory or infectious etiologies, with neoplasm considered less likely. Recommend follow-up by chest CT in 2-3 months. Electronically Signed   By: Danae Orleans M.D.   On: 05/14/2020 10:41   DG Chest Port 1 View  Result Date: 05/14/2020 CLINICAL DATA:  Pain EXAM: PORTABLE CHEST 1 VIEW COMPARISON:  February 01, 2020 FINDINGS: Lungs are clear. Heart size and pulmonary vascularity are normal. No adenopathy. There is degenerative change in the thoracic spine and in each shoulder. IMPRESSION: Lungs clear.  Cardiac silhouette normal. Electronically Signed   By: Bretta Bang III M.D.   On: 05/14/2020 08:16    EKG: Independently reviewed.  Sinus tachycardia; no acute ischemic changes.  Assessment/Plan 1-acute renal failure (ARF) (HCC) -In the setting of prerenal azotemia, dehydration and the use of nephrotoxic agent (Bactrim). -Minimize the use of nephrotoxic agents, contrast and avoid hypotension -Aggressive fluid resuscitation will be provided -No renal abnormalities appreciated on CT scanning of abdomen and pelvis. -Depending improvement in her renal function with current conservative approach will involve nephrology service.  2-GERD (gastroesophageal reflux disease) -Continue PPI  3-Enterocutaneous  fistula -Appears to be overall stable and unchanged for the last 4 months -General surgery is on board and will follow any further recommendation -Okay to use clear liquid diet.  4-Lactic acidosis -in the setting of dehydration -4.1 peaked at time of admission -improved with fluid resuscitation down to 2.6 -continue IVF's and follow lactic acid trend  5-SIRS/presumed early sepsis  -case discussed with ID -patient meeting SIRS criteria on admission (recent cath tip with isolated 50,000 microorganism growth). Blood cx's from same time without growth. Repeating blood cultures today. -will empirically treat with Unasyn  -follow cultures results.  6-Dehydration/hypochloremia and hyponatremia -continue IVF's -follow electrolytes trend  7-moderate protein calorie malnutrition  -patient will need TPN moving forward  -will follow clearance by ID for new PICC placement and at that time discussed with dietitian/pharmacy for resumption of TPN.   DVT prophylaxis: Heparin Code Status:   Full code Family Communication:  No family at bedside. Disposition Plan:   Patient is from:  Home  Anticipated DC to:  Home  Anticipated DC date:  To be determined  Anticipated DC barriers: Improvement in renal function and a stabilization of volume status.  Will also figure out the need of further extended antibiotic therapy.  Consults called:  General surgery (Dr. Lovell Sheehan), infectious disease (Dr. Drue Second). Admission status:  Inpatient, stepdown, length of stay more than 2 midnights.   Severity of Illness: Severe illness, patient presented significantly dehydrated and with concern for Sirs/early sepsis with the reason positive cath tip culture after experiencing intermittent fever while receiving TPN as an outpatient.  Empirical treatment with IV antibiotics, IV fluids and close monitoring will be provided.  Replete electrolytes as needed and closely follow renal function.  Patient presenting with acute  renal failure, hyponatremia and hypokalemia.  Vassie Loll MD [facility]  How to contact the [facility] Attending or Consulting provider 7A - 7P or covering provider during after hours 7P -7A, for this patient?   1. Check the care team in [facility] and look for a) attending/consulting [facility] provider listed and b) the [facility] team listed 2. Log into [email or web address] and use [facility]'s universal password to access. If you do not have the password, please contact the hospital operator. 3. Locate the [facility] provider you are looking for under [facility] and page to a number  that you can be directly reached. 4. If you still have difficulty reaching the provider, please page the [facility] (Director on Call) for the Hospitalists listed on amion for assistance.  05/14/2020, 5:52 PM

## 2020-05-14 NOTE — Consult Note (Signed)
Reason for Consult: Sepsis, dehydration Referring Physician: Dr. Cardell PeachMadera  [name] is an 60 y.o. female.  HPI: Patient is a 60 year old white female well-known to me who underwent a right hemicolectomy for peritonitis due to probable perforated appendicitis on 12/04/2019.  She had to come back to the operating room 2 days later as the bowel was left in discontinuity.  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  She did go to a skilled nursing unit for TPN.  Last week, she contacted me as she was having fevers when the TPN was started in the evenings.  She presented to the emergency room on Friday and the PICC line was removed.  Cultures of the tip reveal a Pantoea species.  Blood cultures are negative.  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  She was found to be septic and a CT scan was performed which revealed no significant intra-abdominal pathology or abscess formation.  The patient is being admitted to the hospital for her electrolyte abnormalities as well as her renal insufficiency.  Past Medical History:  Diagnosis Date  . Anxiety   . Arthritis   . Colon perforation (HCC)    due to appendicitis  . GERD (gastroesophageal reflux disease)   . Hypertension   . S/P endoscopy    2008: noncritical Schatzki ring s/p dilation, normal esophagus and stomach, 2003: normal esophagus, s/p Maloney dilation, multiple antral erosions consistent with chronic gastritis, no H.pylori,     Past Surgical History:  Procedure Laterality Date  . APPENDECTOMY    . BOWEL RESECTION N/A 12/06/2019   Procedure: PARTIAL SMALL BOWEL RESECTION WITH ILEOSTOMY;  Surgeon: Franky MachoJenkins, Matt Delpizzo, MD;  Location: [facility];  Service: General;  Laterality: N/A;  . CENTRAL LINE INSERTION N/A 12/06/2019   Procedure:  CENTRAL LINE INSERTION;  Surgeon: Franky MachoJenkins, Kanyla Omeara, MD;  Location: [facility];  Service: General;  Laterality: N/A;  . CESAREAN SECTION    . CHOLECYSTECTOMY    . COLONOSCOPY  10/17/2010   normal   . ESOPHAGOGASTRODUODENOSCOPY  10/17/2010   non-critical Schatzki's ring s/p dilation, patulous EG junction, small hiatal hernia, otherwise normal stomach, first and second portion of duodenum  . ESOPHAGOGASTRODUODENOSCOPY N/A 10/26/2018   Procedure: ESOPHAGOGASTRODUODENOSCOPY (EGD);  Surgeon: Corbin Adeourk, Robert M, MD;  Location[facility];  Service: Endoscopy;  Laterality: N/A;  9:15am  . FOOT SURGERY Right    heel spur  . HAND SURGERY Left    carpal tunnel  . HERNIA REPAIR N/A    approx.    10 or 12 years ago  . KNEE ARTHROSCOPY WITH MEDIAL MENISECTOMY  01/07/2012   Procedure: KNEE ARTHROSCOPY WITH MEDIAL MENISECTOMY;  Surgeon: Darreld McleanWayne Keeling, MD;  Location: [facility];  Service: Orthopedics;  Laterality: Right;  . LAPAROTOMY N/A 12/04/2019   Procedure: EXPLORATORY LAPAROTOMY;  Surgeon: Franky MachoJenkins, Decklin Weddington, MD;  Location: [facility];  Service: General;  Laterality: N/A;  . LAPAROTOMY N/A 12/06/2019   Procedure: EXPLORATORY LAPAROTOMY;  Surgeon: Franky MachoJenkins, Queena Monrreal, MD;  Location: [facility];  Service: General;  Laterality: N/A;  [name]. MALONEY DILATION  10/17/2010   Procedure: Alvy BealMALONEY DILATION;  Surgeon: Corbin Adeobert M Rourk, MD;  [facility];  Service: Endoscopy;  Laterality: N/A;  [name]. MALONEY DILATION N/A 10/26/2018   Procedure: Christina HashimotoMALONEY DILATION;  Surgeon: Corbin Adeourk, Robert M, MD;  [facility];  Service:  Endoscopy;  Laterality: N/A;  . PARTIAL COLECTOMY Right 12/04/2019   Procedure: RIGHT HEMI COLECTOMY WITH TERMINAL ILEUM RESECTION;  Surgeon: Franky Macho, MD;  Location: [facility];  Service: General;  Laterality: Right;  . TOTAL KNEE ARTHROPLASTY Right 04/18/2019   Procedure: RIGHT TOTAL KNEE ARTHROPLASTY;  Surgeon: Vickki Hearing, MD;  Location: [facility];  Service: Orthopedics;  Laterality: Right;  . TRIGGER FINGER  RELEASE Right 08/20/2016   Procedure: RELEASE A-1 PULLEY RIGHT LONG FINGER;  Surgeon: Vickki Hearing, MD;  Location: [facility];  Service: Orthopedics;  Laterality: Right;    Family History  Problem Relation Age of Onset  . Cancer Father        esophagus ca, living  . Colon cancer Paternal Uncle        in 40s, early 7s  . Colon polyps Neg Hx     Social History:  reports that she has never smoked. She has never used smokeless tobacco. She reports current alcohol use. She reports that she does not use drugs.  Allergies:  Allergies  Allergen Reactions  . 5-Alpha Reductase Inhibitors   . Maxipime [Cefepime]     Hives    . Metronidazole     hives  . Acetaminophen-Codeine Rash    Tylenol with Codeine Tylenol #3,    Medications: I have reviewed the patient's current medications.  Results for orders placed or performed during the hospital encounter of 05/14/20 (from the past 48 hour(s))  CK     Status: None   Collection Time: 05/14/20  7:50 AM  Result Value Ref Range   Total CK 39 38 - 234 U/L    Comment: Performed at [facility], [address], [place], [place] [address]  Lactic acid, plasma     Status: Abnormal   Collection Time: 05/14/20  7:50 AM  Result Value Ref Range   Lactic Acid, Venous 3.4 (HH) 0.5 - 1.9 mmol/L    Comment: CRITICAL RESULT CALLED TO, READ BACK BY AND VERIFIED WITH:  MYRICK,B 0840 05/14/2020 COLEMAN,R Performed at [facility], [address], [place], [place] [address]   Comprehensive metabolic panel     Status: Abnormal   Collection Time: 05/14/20  7:50 AM  Result Value Ref Range   Sodium 123 (L) 135 - 145 mmol/L   Potassium 3.6 3.5 - 5.1 mmol/L   Chloride 80 (L) 98 - 111 mmol/L   CO2 21 (L) 22 - 32 mmol/L   Glucose, Bld 176 (H) 70 - 99 mg/dL    Comment: Glucose reference range applies only to samples taken after fasting for at least 8 hours.   BUN 65 (H) 6 - 20 mg/dL   Creatinine, Ser 0.98 (H) 0.44 - 1.00 mg/dL   Calcium 8.7 (L) 8.9 - 10.3  mg/dL   Total Protein 11.9 (H) 6.5 - 8.1 g/dL   Albumin 3.5 3.5 - 5.0 g/dL   AST 84 (H) 15 - 41 U/L   ALT 59 (H) 0 - 44 U/L   Alkaline Phosphatase 303 (H) 38 - 126 U/L   Total Bilirubin 1.0 0.3 - 1.2 mg/dL   GFR, Estimated 9 (L) >60 mL/min    Comment: (NOTE) Calculated using the CKD-EPI Creatinine Equation (2021)    Anion gap 22 (H) 5 - 15    Comment: Performed at [facility], [address], [place], [place] [address]  CBC WITH DIFFERENTIAL     Status: Abnormal   Collection Time: 05/14/20  7:50 AM  Result Value Ref Range  WBC 14.4 (H) 4.0 - 10.5 K/uL   RBC 4.81 3.87 - 5.11 MIL/uL   Hemoglobin 13.2 12.0 - 15.0 g/dL   HCT 38.1 82.9 - 93.7 %   MCV 82.5 80.0 - 100.0 fL   MCH 27.4 26.0 - 34.0 pg   MCHC 33.2 30.0 - 36.0 g/dL   RDW 16.9 67.8 - 93.8 %   Platelets 558 (H) 150 - 400 K/uL   nRBC 0.0 0.0 - 0.2 %   Neutrophils Relative % 75 %   Neutro Abs 10.8 (H) 1.7 - 7.7 K/uL   Lymphocytes Relative 14 %   Lymphs Abs 2.0 0.7 - 4.0 K/uL   Monocytes Relative 10 %   Monocytes Absolute 1.4 (H) 0.1 - 1.0 K/uL   Eosinophils Relative 0 %   Eosinophils Absolute 0.0 0.0 - 0.5 K/uL   Basophils Relative 0 %   Basophils Absolute 0.1 0.0 - 0.1 K/uL   Immature Granulocytes 1 %   Abs Immature Granulocytes 0.16 (H) 0.00 - 0.07 K/uL    Comment: Performed at [facility], [address], [place], [place] [address]  Protime-INR     Status: None   Collection Time: 05/14/20  7:50 AM  Result Value Ref Range   Prothrombin Time 13.4 11.4 - 15.2 seconds   INR 1.1 0.8 - 1.2    Comment: (NOTE) INR goal varies based on device and disease states. Performed at [facility], [address], [place], [place] [address]   APTT     Status: Abnormal   Collection Time: 05/14/20  7:50 AM  Result Value Ref Range   aPTT 39 (H) 24 - 36 seconds    Comment:        IF BASELINE aPTT IS ELEVATED, SUGGEST PATIENT RISK ASSESSMENT BE USED TO DETERMINE APPROPRIATE ANTICOAGULANT THERAPY. Performed at [facility],  [address] [place], [place] [address]   Blood Culture (routine x 2)     Status: None (Preliminary result)   Collection Time: 05/14/20  7:50 AM   Specimen: BLOOD  Result Value Ref Range   Specimen Description BLOOD RIGHT ANTECUBITAL    Special Requests      Blood Culture results may not be optimal due to an inadequate volume of blood received in culture bottles BOTTLES DRAWN AEROBIC ONLY Performed at [facility], [address], [place], [place] [address]    Culture PENDING    Report Status PENDING   Resp Panel by RT-PCR (Flu A&B, Covid) Nasopharyngeal Swab     Status: None   Collection Time: 05/14/20  7:51 AM   Specimen: Nasopharyngeal Swab; Nasopharyngeal(NP) swabs in vial transport medium  Result Value Ref Range   SARS Coronavirus 2 by RT PCR NEGATIVE NEGATIVE    Comment: (NOTE) SARS-CoV-2 target nucleic acids are NOT DETECTED.  The SARS-CoV-2 RNA is generally detectable in upper respiratory specimens during the acute phase of infection. The lowest concentration of SARS-CoV-2 viral copies this assay can detect is 138 copies/mL. A negative result does not preclude SARS-Cov-2 infection and should not be used as the sole basis for treatment or other patient management decisions. A negative result may occur with  improper specimen collection/handling, submission of specimen other than nasopharyngeal swab, presence of viral mutation(s) within the areas targeted by this assay, and inadequate number of viral copies(<138 copies/mL). A negative result must be combined with clinical observations, patient history, and epidemiological information. The expected result is Negative.  Fact Sheet for Patients:  BloggerCourse.com  Fact Sheet for Healthcare Providers:  SeriousBroker.it  This test is no t yet approved or cleared by the [place] and  has been authorized for detection and/or diagnosis of SARS-CoV-2 by FDA under an Emergency  Use Authorization (EUA). This EUA will remain  in effect (meaning this test can be used) for the duration of the COVID-19 declaration under Section 564(b)(1) of the Act, 21 U.S.C.section 360bbb-3(b)(1), unless the authorization is terminated  or revoked sooner.       Influenza A by PCR NEGATIVE NEGATIVE   Influenza B by PCR NEGATIVE NEGATIVE    Comment: (NOTE) The Xpert Xpress SARS-CoV-2/FLU/RSV plus assay is intended as an aid in the diagnosis of influenza from Nasopharyngeal swab specimens and should not be used as a sole basis for treatment. Nasal washings and aspirates are unacceptable for Xpert Xpress SARS-CoV-2/FLU/RSV testing.  Fact Sheet for Patients: BloggerCourse.com  Fact Sheet for Healthcare Providers: SeriousBroker.it  This test is not yet approved or cleared by the [place] FDA and has been authorized for detection and/or diagnosis of SARS-CoV-2 by FDA under an Emergency Use Authorization (EUA). This EUA will remain in effect (meaning this test can be used) for the duration of the COVID-19 declaration under Section 564(b)(1) of the Act, 21 U.S.C. section 360bbb-3(b)(1), unless the authorization is terminated or revoked.  Performed at [facility], [address] [place], [place] [address]   Blood Culture (routine x 2)     Status: None (Preliminary result)   Collection Time: 05/14/20  7:55 AM   Specimen: BLOOD  Result Value Ref Range   Specimen Description BLOOD BLOOD RIGHT WRIST    Special Requests      Blood Culture adequate volume BOTTLES DRAWN AEROBIC AND ANAEROBIC Performed at [facility], [address] [place], [place] [address]    Culture PENDING    Report Status PENDING   Magnesium     Status: None   Collection Time: 05/14/20  8:10 AM  Result Value Ref Range   Magnesium 2.1 1.7 - 2.4 mg/dL    Comment: Performed at [facility], [address], [place], [place] [address]  Lactic acid, plasma      Status: Abnormal   Collection Time: 05/14/20  9:49 AM  Result Value Ref Range   Lactic Acid, Venous 4.1 (HH) 0.5 - 1.9 mmol/L    Comment: CRITICAL RESULT CALLED TO, READ BACK BY AND VERIFIED WITH: MYRICK,B 1020 05/14/2020 COLEMAN,R Performed at [facility], [address], [place], [place] [address]   Lactic acid, plasma     Status: Abnormal   Collection Time: 05/14/20 11:50 AM  Result Value Ref Range   Lactic Acid, Venous 2.6 (HH) 0.5 - 1.9 mmol/L    Comment: CRITICAL RESULT CALLED TO, READ BACK BY AND VERIFIED WITH: CRAWFORD,H 1238 05/14/2020 COLEMAN,R Performed at [facility], [address], [place], [place] [address]   Lactic acid, plasma     Status: None   Collection Time: 05/14/20  2:02 PM  Result Value Ref Range   Lactic Acid, Venous 1.4 0.5 - 1.9 mmol/L    Comment: Performed at [facility], [address], [place], [place] [address]    CT ABDOMEN PELVIS WO CONTRAST  Result Date: 05/14/2020 CLINICAL DATA:  Right-sided abdominal pain for several months. Acute onset nausea and vomiting yesterday. Several months postop from right colectomy and distal small bowel resection. EXAM: CT ABDOMEN AND PELVIS WITHOUT CONTRAST TECHNIQUE: Multidetector CT imaging of the abdomen and pelvis was performed following the standard protocol without IV contrast. COMPARISON:  05/03/2020 FINDINGS: Lower chest: An ill-defined focal nodular opacity is seen in the anterior left lower lobe which measures 15 mm and is new since previous study. Hepatobiliary: Heterogeneous pattern of steatosis is again seen. No focal mass visualized on this unenhanced exam. Prior cholecystectomy. No evidence of biliary obstruction. Pancreas: No mass or inflammatory process visualized on this unenhanced exam. Spleen:  Within normal limits in size. Adrenals/Urinary tract: No evidence of urolithiasis or hydronephrosis. Unremarkable unopacified urinary bladder. Stomach/Bowel: Postop changes from right colectomy and right lower  quadrant ileostomy remains stable. No evidence of bowel obstruction, abscess, or acute inflammatory process. Possible umbilical fistula is again seen with several matted small bowel loops just deep to the anterior abdominal wall at the umbilicus. Fluid and gas are noted within the umbilical soft tissues. No evidence of hernia. Vascular/Lymphatic: No pathologically enlarged lymph nodes identified. No evidence of abdominal aortic aneurysm. Reproductive:  No mass or other significant abnormality. Other:  None. Musculoskeletal:  No suspicious bone lesions identified. IMPRESSION: No evidence of bowel obstruction or abscess. 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 No evidence of hernia. Stable hepatic steatosis. New ill-defined nodular opacity in anterior left lower lobe. Differential diagnosis includes inflammatory or infectious etiologies, with neoplasm considered less likely. Recommend follow-up by chest CT in 2-3 months. Electronically Signed   By: Danae Orleans M.D.   On: 05/14/2020 10:41   DG Chest Port 1 View  Result Date: 05/14/2020 CLINICAL DATA:  Pain EXAM: PORTABLE CHEST 1 VIEW COMPARISON:  February 01, 2020 FINDINGS: Lungs are clear. Heart size and pulmonary vascularity are normal. No adenopathy. There is degenerative change in the thoracic spine and in each shoulder. IMPRESSION: Lungs clear.  Cardiac silhouette normal. Electronically Signed   By: Bretta Bang III M.D.   On: 05/14/2020 08:16    ROS:  Pertinent items are noted in HPI.  Blood pressure 99/62, pulse 85, temperature 97.6 F (36.4 C), temperature source Oral, resp. rate 16, height 5\' 1"  (1.549 m), weight 65.4 kg, SpO2 99 %. Physical Exam: Pleasant white female no acute distress Head is normocephalic, atraumatic Lungs are clear to auscultation with good breath sounds bilaterally Heart examination reveals regular rate and rhythm without S3, S4, murmurs Abdomen is flat with an  ostomy bag present along the right side of the abdomen as well as the midline incision.  The abdomen is soft.  No rigidity is noted.  CT scan images personally reviewed Labs reviewed  Assessment/Plan: Impression: Sepsis secondary to hypovolemia, entero- cutaneous fistula output.  Sepsis from her PICC line catheter infection less likely at the present time.  She was placed on Bactrim as an outpatient. Plan: I discussed this with Dr. .  ID will be consulted concerning any need for treatment going forward concerning the PICC line infection.  She may be on a clear liquid diet.  Would like to get her electrolytes under control and her renal insufficiency treated.  Ultimately, she will need referral to a tertiary care center for treatment of her enterocutaneous fistula.  Gwenlyn Perking 05/14/2020, 5:34 PM

## 2020-05-14 NOTE — ED Triage Notes (Signed)
Pt presents to ED and states she has had vomiting and generalized body "cramping" for 2 days. Denies fever. Pt has colostomy bag. Pt had PICC line removed 05/10/20

## 2020-05-14 NOTE — ED Notes (Signed)
Date and time results received: 05/14/20 10:55 AM  Test: lactic acid Critical Value: 4.1  Name of Provider Notified: dr.goldston  Orders Received? Or Actions Taken?:

## 2020-05-15 LAB — COMPREHENSIVE METABOLIC PANEL
ALT: 54 U/L — ABNORMAL HIGH (ref 0–44)
AST: 82 U/L — ABNORMAL HIGH (ref 15–41)
Albumin: 2.7 g/dL — ABNORMAL LOW (ref 3.5–5.0)
Alkaline Phosphatase: 262 U/L — ABNORMAL HIGH (ref 38–126)
Anion gap: 16 — ABNORMAL HIGH (ref 5–15)
BUN: 55 mg/dL — ABNORMAL HIGH (ref 6–20)
CO2: 24 mmol/L (ref 22–32)
Calcium: 7.8 mg/dL — ABNORMAL LOW (ref 8.9–10.3)
Chloride: 90 mmol/L — ABNORMAL LOW (ref 98–111)
Creatinine, Ser: 3.63 mg/dL — ABNORMAL HIGH (ref 0.44–1.00)
GFR, Estimated: 14 mL/min — ABNORMAL LOW (ref >60)
Glucose, Bld: 92 mg/dL (ref 70–99)
Potassium: 2.5 mmol/L — CL (ref 3.5–5.1)
Sodium: 130 mmol/L — ABNORMAL LOW (ref 135–145)
Total Bilirubin: 1.2 mg/dL (ref 0.3–1.2)
Total Protein: 7.5 g/dL (ref 6.5–8.1)

## 2020-05-15 LAB — CATH TIP CULTURE: Culture: 50000 — AB

## 2020-05-15 LAB — CBC
HCT: 33 % — ABNORMAL LOW (ref 36.0–46.0)
Hemoglobin: 10.6 g/dL — ABNORMAL LOW (ref 12.0–15.0)
MCH: 27.8 pg (ref 26.0–34.0)
MCHC: 32.1 g/dL (ref 30.0–36.0)
MCV: 86.6 fL (ref 80.0–100.0)
Platelets: 329 10*3/uL (ref 150–400)
RBC: 3.81 MIL/uL — ABNORMAL LOW (ref 3.87–5.11)
RDW: 13.7 % (ref 11.5–15.5)
WBC: 5.5 10*3/uL (ref 4.0–10.5)
nRBC: 0 % (ref 0.0–0.2)

## 2020-05-15 LAB — LACTIC ACID, PLASMA: Lactic Acid, Venous: 1.3 mmol/L (ref 0.5–1.9)

## 2020-05-15 LAB — CULTURE, BLOOD (ROUTINE X 2)
Culture: NO GROWTH
Culture: NO GROWTH
Special Requests: ADEQUATE
Special Requests: ADEQUATE

## 2020-05-15 LAB — URINALYSIS, ROUTINE W REFLEX MICROSCOPIC
Bilirubin Urine: NEGATIVE
Glucose, UA: NEGATIVE mg/dL
Hgb urine dipstick: NEGATIVE
Ketones, ur: NEGATIVE mg/dL
Leukocytes,Ua: NEGATIVE
Nitrite: NEGATIVE
Protein, ur: NEGATIVE mg/dL
Specific Gravity, Urine: 1.018 (ref 1.005–1.030)
pH: 5 (ref 5.0–8.0)

## 2020-05-15 LAB — MRSA PCR SCREENING: MRSA by PCR: NEGATIVE

## 2020-05-15 LAB — POTASSIUM: Potassium: 3.4 mmol/L — ABNORMAL LOW (ref 3.5–5.1)

## 2020-05-15 MED ORDER — SODIUM CHLORIDE 0.9 % IV SOLN: 250.0000 mL | INTRAVENOUS | Status: DC

## 2020-05-15 MED ORDER — OCTREOTIDE ACETATE 100 MCG/ML IJ SOLN
100.0000 ug | Freq: Three times a day (TID) | INTRAMUSCULAR | Status: DC
  Administered 2020-05-15 – 2020-05-19 (×12): 100 ug via SUBCUTANEOUS
  Filled 2020-05-15 (×16): qty 1

## 2020-05-15 MED ORDER — SODIUM CHLORIDE 0.9 % IV BOLUS
500.0000 mL | Freq: Once | INTRAVENOUS | Status: AC
  Administered 2020-05-15: 500 mL via INTRAVENOUS

## 2020-05-15 MED ORDER — POTASSIUM CHLORIDE CRYS ER 20 MEQ PO TBCR
40.0000 meq | EXTENDED_RELEASE_TABLET | Freq: Once | ORAL | Status: AC
  Administered 2020-05-15: 40 meq via ORAL
  Filled 2020-05-15: qty 2

## 2020-05-15 MED ORDER — POTASSIUM CHLORIDE 10 MEQ/100ML IV SOLN
10.0000 meq | INTRAVENOUS | Status: AC
  Administered 2020-05-15 (×4): 10 meq via INTRAVENOUS
  Filled 2020-05-15 (×4): qty 100

## 2020-05-15 MED ORDER — SODIUM CHLORIDE 0.9 % IV BOLUS
2000.0000 mL | Freq: Once | INTRAVENOUS | Status: AC
  Administered 2020-05-15: 2000 mL via INTRAVENOUS

## 2020-05-15 MED ORDER — SODIUM CHLORIDE 0.9 % IV BOLUS
3000.0000 mL | Freq: Once | INTRAVENOUS | Status: AC
  Administered 2020-05-15: 3000 mL via INTRAVENOUS

## 2020-05-15 MED ORDER — PANTOPRAZOLE SODIUM 40 MG IV SOLR
40.0000 mg | INTRAVENOUS | Status: DC
  Administered 2020-05-16 – 2020-05-21 (×6): 40 mg via INTRAVENOUS
  Filled 2020-05-15 (×6): qty 40

## 2020-05-15 MED ORDER — PHENYLEPHRINE HCL-NACL 10-0.9 MG/250ML-% IV SOLN
25.0000 ug/min | INTRAVENOUS | Status: DC
  Administered 2020-05-15: 35 ug/min via INTRAVENOUS
  Administered 2020-05-15: 25 ug/min via INTRAVENOUS
  Administered 2020-05-16: 35 ug/min via INTRAVENOUS
  Filled 2020-05-15 (×3): qty 250

## 2020-05-15 MED ORDER — LOPERAMIDE HCL 1 MG/7.5ML PO SUSP
2.0000 mg | Freq: Four times a day (QID) | ORAL | Status: DC | PRN
  Administered 2020-05-15 – 2020-05-16 (×3): 2 mg via ORAL
  Filled 2020-05-15 (×4): qty 15

## 2020-05-15 MED ORDER — PHENYLEPHRINE HCL-NACL 10-0.9 MG/250ML-% IV SOLN: 0.0000 ug/min | INTRAVENOUS | Status: DC

## 2020-05-15 NOTE — Progress Notes (Signed)
Pharmacy Antibiotic Note  [name] is a 60 y.o. female admitted on 05/14/2020 with Unasyn.  Pharmacy has been consulted for Unasyn dosing. Patient presented a few days ago with Fevers with IV TPN. PICC line removed 3/18 which she had for 5 months post perforated appendicitis. Patient now with generalized body aches but no fever.  CrCl improved, adjust unasyn  Plan: Increase Unasyn 3gm IV q12h F/U Cxs and clinical progress Monitor V/S, labs and levels as indicated  Height: 5\' 1"  (154.9 cm) Weight: 65.3 kg (143 lb 15.4 oz) IBW/kg (Calculated) : 47.8  Temp (24hrs), Avg:97.9 F (36.6 C), Min:97.6 F (36.4 C), Max:98.5 F (36.9 C)  Recent Labs  Lab 05/14/20 0750 05/14/20 0949 05/14/20 1150 05/14/20 1402 05/15/20 0531  WBC 14.4*  --   --   --  5.5  CREATININE 5.30*  --   --   --  3.63*  LATICACIDVEN 3.4* 4.1* 2.6* 1.4 1.3    Estimated Creatinine Clearance: 14.4 mL/min (A) (by C-G formula based on SCr of 3.63 mg/dL (H)).    Allergies  Allergen Reactions  . 5-Alpha Reductase Inhibitors   . Maxipime [Cefepime]     Hives    . Metronidazole     hives  . Acetaminophen-Codeine Rash    Tylenol with Codeine Tylenol #3,    Antimicrobials this admission: Vancomycin 3/22 >>3/22 Zosyn 3/22 >> 3/22 Unasyn 3/22>>  Dose adjustments this admission: prn  Microbiology results: 3/22 BCx: pending 3/22 UCx: pending 3/18 Urine cath tip: 50K CFU/ml Pantoea species s- cefazolin , cefepime, ceftriaxone, cipro septra  I-zosyn   MRSA PCR:   Thank you for allowing pharmacy to be a part of this patient's care.  4/18, BS Pharm D, BCPS Clinical Pharmacist Pager [phone] 05/15/2020 8:02 AM

## 2020-05-15 NOTE — Progress Notes (Signed)
Notified Dr. Sherryll Burger of low BP despite fluid boluses. Pt reports feeling very "weak and tired". Manual BP obtained and correlates with automatic. Awaiting further orders.

## 2020-05-15 NOTE — Progress Notes (Signed)
Subjective: Patient is fatigued and did not sleep well overnight.  Objective: Vital signs in last 24 hours: Temp:  [97.6 F (36.4 C)-98.5 F (36.9 C)] 98.5 F (36.9 C) (03/23 0731) Pulse Rate:  [72-125] 89 (03/23 0815) Resp:  [11-32] 18 (03/23 0815) BP: (59-113)/(35-80) 75/43 (03/23 0815) SpO2:  [96 %-100 %] 99 % (03/23 0815) Weight:  [65.3 kg-65.4 kg] 65.3 kg (03/23 0500) Last BM Date: 05/14/20  Intake/Output from previous day: 03/22 0701 - 03/23 0700 In: 3260.3 [P.O.:240; I.V.:1878.8; IV Piggyback:1141.5] Out: 4300 [Urine:500; Drains:2850; Stool:950] Intake/Output this shift: Total I/O In: 166.4 [I.V.:166.4] Out: -   General appearance: alert, cooperative and no distress GI: Soft.  Significant succus drainage from enterocutaneous fistula along the midline incision.  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  Lab Results:  Recent Labs    05/14/20 0750 05/15/20 0531  WBC 14.4* 5.5  HGB 13.2 10.6*  HCT 39.7 33.0*  PLT 558* 329   BMET Recent Labs    05/14/20 0750 05/15/20 0531  NA 123* 130*  K 3.6 2.5*  CL 80* 90*  CO2 21* 24  GLUCOSE 176* 92  BUN 65* 55*  CREATININE 5.30* 3.63*  CALCIUM 8.7* 7.8*   PT/INR Recent Labs    05/14/20 0750  LABPROT 13.4  INR 1.1    Studies/Results: CT ABDOMEN PELVIS WO CONTRAST  Result Date: 05/14/2020 CLINICAL DATA:  Right-sided abdominal pain for several months. Acute onset nausea and vomiting yesterday. Several months postop from right colectomy and distal small bowel resection. EXAM: CT ABDOMEN AND PELVIS WITHOUT CONTRAST TECHNIQUE: Multidetector CT imaging of the abdomen and pelvis was performed following the standard protocol without IV contrast. COMPARISON:  05/03/2020 FINDINGS: Lower chest: An ill-defined focal nodular opacity is seen in the anterior left lower lobe which measures 15 mm and is new since previous study. Hepatobiliary: Heterogeneous pattern of steatosis is again seen. No focal mass visualized on this  unenhanced exam. Prior cholecystectomy. No evidence of biliary obstruction. Pancreas: No mass or inflammatory process visualized on this unenhanced exam. Spleen:  Within normal limits in size. Adrenals/Urinary tract: No evidence of urolithiasis or hydronephrosis. Unremarkable unopacified urinary bladder. Stomach/Bowel: Postop changes from right colectomy and right lower quadrant ileostomy remains stable. No evidence of bowel obstruction, abscess, or acute inflammatory process. Possible umbilical fistula is again seen with several matted small bowel loops just deep to the anterior abdominal wall at the umbilicus. Fluid and gas are noted within the umbilical soft tissues. No evidence of hernia. Vascular/Lymphatic: No pathologically enlarged lymph nodes identified. No evidence of abdominal aortic aneurysm. Reproductive:  No mass or other significant abnormality. Other:  None. Musculoskeletal:  No suspicious bone lesions identified. IMPRESSION: No evidence of bowel obstruction or abscess. 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 No evidence of hernia. Stable hepatic steatosis. New ill-defined nodular opacity in anterior left lower lobe. Differential diagnosis includes inflammatory or infectious etiologies, with neoplasm considered less likely. Recommend follow-up by chest CT in 2-3 months. Electronically Signed   By: Danae Orleans M.D.   On: 05/14/2020 10:41   DG Chest Port 1 View  Result Date: 05/14/2020 CLINICAL DATA:  Pain EXAM: PORTABLE CHEST 1 VIEW COMPARISON:  February 01, 2020 FINDINGS: Lungs are clear. Heart size and pulmonary vascularity are normal. No adenopathy. There is degenerative change in the thoracic spine and in each shoulder. IMPRESSION: Lungs clear.  Cardiac silhouette normal. Electronically Signed   By: Bretta Bang III M.D.   On:  05/14/2020 08:16    Anti-infectives: Anti-infectives (From admission, onward)   Start     Dose/Rate Route  Frequency Ordered Stop   05/14/20 2100  Ampicillin-Sulbactam (UNASYN) 3 g in sodium chloride 0.9 % 100 mL IVPB        3 g 200 mL/hr over 30 Minutes Intravenous Every 24 hours 05/14/20 1804     05/14/20 1800  piperacillin-tazobactam (ZOSYN) IVPB 3.375 g  Status:  Discontinued        3.375 g 12.5 mL/hr over 240 Minutes Intravenous Every 12 hours 05/14/20 1305 05/14/20 1804   05/14/20 0915  piperacillin-tazobactam (ZOSYN) IVPB 3.375 g        3.375 g 100 mL/hr over 30 Minutes Intravenous  Once 05/14/20 0901 05/14/20 1055   05/14/20 0900  aztreonam (AZACTAM) 2 g in sodium chloride 0.9 % 100 mL IVPB  Status:  Discontinued        2 g 200 mL/hr over 30 Minutes Intravenous  Once 05/14/20 0854 05/14/20 0859   05/14/20 0900  vancomycin (VANCOCIN) IVPB 1000 mg/200 mL premix  Status:  Discontinued        1,000 mg 200 mL/hr over 60 Minutes Intravenous  Once 05/14/20 0854 05/14/20 0859   05/14/20 0900  vancomycin (VANCOREADY) IVPB 1250 mg/250 mL        1,250 mg 166.7 mL/hr over 90 Minutes Intravenous  Once 05/14/20 0859 05/14/20 1454      Assessment/Plan: Impression: Significant intestinal output from enterocutaneous fistula resulting in dehydration, electrolyte abnormalities.  Patient hypokalemic.  Her hyponatremia is improved.  Her renal failure is nonoliguric in nature and slightly improved.  No leukocytosis.  Will defer to medical service as to whether to treat the PICC line tip infection.  Will get wound care involved in control of her enterocutaneous fistula.  No surgical intervention warranted at this time.  Once renal failure resolved, will need PICC line placed back in.  LOS: 1 day    [name] 05/15/2020

## 2020-05-15 NOTE — Consult Note (Addendum)
WOC Nurse ostomy follow up Patient receiving care in [facility] ICU5. Patient came to [facility] ED 12/04/19 with 2 weeks of abdominal pain, and diagnosed with a bowel perforation.  Was taken to the OR and a right hemicolectomy was performed and VAC placed by Dr. Lovell Sheehan. Then on 12/06/19 she went back to the OR and an ileostomy was created.   Sometime between 10/13 and 10/20 she developed a fistula in the abdominal wound. She was discharged without the WOC team being involved in her care.  Today, the patient is able to answer my questions during my [facility]Link visit. Stoma type/location: RUQ ileostomy that sits in a crease.  For this I have ordered the flexible one piece flat pouch, Lawson #[id number].  She tells me she uses a barrier ring, which I have also ordered, [employer] [place] [phone]. As for the fistula, she presents with a medium Eakin pouch in place and connected to a bedside drainage bag.  She tells me it has to be changed due to leakage about every 3 days.  I have requested Eakin pouch [id number] which is 9.7 inches by 6.3 inches in size.  She tells me a small would not work when it was tried prior to this hospitalization. She will need barrier rings to fill in any creases around the wound/fistula.    Pouching of both of these was discussed and described to the primary RN, Marchelle Folks.  The patient was involved and contributing to the conversation as well.  Pouching instructions entered and discussed during the [facility] visit are as follows:  When the fistula pouch or ileostomy pouches need changing do the following. Remove the pouch. Wash with WATER ONLY.  Thoroughly dry the skin. Fill in any creases around the abdominal wound with barrier ring. Place a barrier ring around the ileostomy stoma ANY TIME the ileostomy pouch is changed. Cut the opening to fit around the stoma or fistula wound. Place the pouch on the skin.  Helmut Muster, RN, MSN, CWOCN, CNS-BC, pager [phone]

## 2020-05-15 NOTE — Progress Notes (Signed)
PROGRESS NOTE    [name]  ZOX:[id number] DOB: 1960-07-31 DOA: 05/14/2020 PCP: Avis EpleyJackson, Samantha J, PA-C   Brief Narrative:   [name] is a 60 y.o. female with medical history significant of history of perforated appendicitis with partial small bowel resection and ileostomy, gastroesophageal flux disease, hypertension and recent infected PICC line and fevers approximately 4 to 5 days prior to admission.  She was noted to have a positive cath tip demonstrating pantoea species and was treated empirically with Bactrim.  She has been started on Unasyn empirically on admission.  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  She continues to have a high amount of output with soft blood pressure readings requiring the use of repeat fluid boluses with ongoing aggressive maintenance fluid.  Assessment & Plan:   Principal Problem:   Acute renal failure (ARF) (HCC) Active Problems:   GERD (gastroesophageal reflux disease)   Enterocutaneous fistula   Lactic acidosis   Dehydration   Prerenal AKI -Improving with aggressive hydration, continue to monitor -Strict I's and O's -Avoid nephrotoxic agents  Hypokalemia -Continue to monitor and replete aggressively  Hypotension -Secondary to high output fistula -Continue aggressive IV fluid hydration with repeat boluses as needed  GERD -Continue PPI twice daily  High output enterocutaneous fistula -Per general surgery -Need to continue aggressive IV fluid with fluid boluses -Sandostatin and Imodium initiated -Likely need for transfer to general surgery  Lactic acidosis-resolved -Likely secondary to dehydration  SIRS criteria -Recent catheter tip with pantoea spp -Blood cultures x24 hours with no growth -Continue to monitor cultures -Unasyn empirically  Dehydration with hyponatremia -Continue IV fluid and monitor  Moderate protein calorie malnutrition -Continue with  TPN moving forward after PICC can be replaced  DVT prophylaxis: Heparin Code Status: Full Family Communication: Patient will call Disposition Plan:  Status is: Inpatient  Remains inpatient appropriate because:Persistent severe electrolyte disturbances, IV treatments appropriate due to intensity of illness or inability to take PO and Inpatient level of care appropriate due to severity of illness   Dispo: The patient is from: Home              Anticipated d/c is to: Home              Patient currently is not medically stable to d/c.   Difficult to place patient No   Consultants:   General Surgery  Procedures:   See below  Antimicrobials:  Anti-infectives (From admission, onward)   Start     Dose/Rate Route Frequency Ordered Stop   05/14/20 2100  Ampicillin-Sulbactam (UNASYN) 3 g in sodium chloride 0.9 % 100 mL IVPB        3 g 200 mL/hr over 30 Minutes Intravenous Every 24 hours 05/14/20 1804     05/14/20 1800  piperacillin-tazobactam (ZOSYN) IVPB 3.375 g  Status:  Discontinued        3.375 g 12.5 mL/hr over 240 Minutes Intravenous Every 12 hours 05/14/20 1305 05/14/20 1804   05/14/20 0915  piperacillin-tazobactam (ZOSYN) IVPB 3.375 g        3.375 g 100 mL/hr over 30 Minutes Intravenous  Once 05/14/20 0901 05/14/20 1055   05/14/20 0900  aztreonam (AZACTAM) 2 g in sodium chloride 0.9 % 100 mL IVPB  Status:  Discontinued        2 g 200 mL/hr over 30 Minutes Intravenous  Once 05/14/20 0854 05/14/20 0859   05/14/20 0900  vancomycin (VANCOCIN) IVPB 1000 mg/200 mL premix  Status:  Discontinued        1,000 mg 200 mL/hr over 60 Minutes Intravenous  Once 05/14/20 0854 05/14/20 0859   05/14/20 0900  vancomycin (VANCOREADY) IVPB 1250 mg/250 mL        1,250 mg 166.7 mL/hr over 90 Minutes Intravenous  Once 05/14/20 0859 05/14/20 1454       Subjective: Patient seen and evaluated today with ongoing high amounts of output, but no other concerns noted.  Objective: Vitals:    05/15/20 1230 05/15/20 1300 05/15/20 1330 05/15/20 1400  BP: (!) 90/43 (!) 89/44 (!) 104/39 (!) 81/43  Pulse: 86 87 95 87  Resp: 10 16 14 15   Temp:      TempSrc:      SpO2: 99% 98% 99% 100%  Weight:      Height:        Intake/Output Summary (Last 24 hours) at 05/15/2020 1415 Last data filed at 05/15/2020 1403 Gross per 24 hour  Intake 6613.8 ml  Output 7325 ml  Net -711.2 ml   Filed Weights   05/14/20 0734 05/14/20 1510 05/15/20 0500  Weight: 62.6 kg 65.4 kg 65.3 kg    Examination:  General exam: Appears calm and comfortable  Respiratory system: Clear to auscultation. Respiratory effort normal. Cardiovascular system: S1 & S2 heard, RRR.  Gastrointestinal system: Abdomen with ileostomy bag and significant drainage from enterocutaneous fistula along the midline incision. Central nervous system: Alert and awake Extremities: No edema Skin: No significant lesions noted Psychiatry: Flat affect.    Data Reviewed: I have personally reviewed following labs and imaging studies  CBC: Recent Labs  Lab 05/14/20 0750 05/15/20 0531  WBC 14.4* 5.5  NEUTROABS 10.8*  --   HGB 13.2 10.6*  HCT 39.7 33.0*  MCV 82.5 86.6  PLT 558* 329   Basic Metabolic Panel: Recent Labs  Lab 05/14/20 0750 05/14/20 0810 05/15/20 0531  NA 123*  --  130*  K 3.6  --  2.5*  CL 80*  --  90*  CO2 21*  --  24  GLUCOSE 176*  --  92  BUN 65*  --  55*  CREATININE 5.30*  --  3.63*  CALCIUM 8.7*  --  7.8*  MG  --  2.1  --    GFR: Estimated Creatinine Clearance: 14.4 mL/min (A) (by C-G formula based on SCr of 3.63 mg/dL (H)). Liver Function Tests: Recent Labs  Lab 05/14/20 0750 05/15/20 0531  AST 84* 82*  ALT 59* 54*  ALKPHOS 303* 262*  BILITOT 1.0 1.2  PROT 10.4* 7.5  ALBUMIN 3.5 2.7*   No results for input(s): LIPASE, AMYLASE in the last 168 hours. No results for input(s): AMMONIA in the last 168 hours. Coagulation Profile: Recent Labs  Lab 05/14/20 0750  INR 1.1   Cardiac  Enzymes: Recent Labs  Lab 05/14/20 0750  CKTOTAL 39   BNP (last 3 results) No results for input(s): PROBNP in the last 8760 hours. HbA1C: No results for input(s): HGBA1C in the last 72 hours. CBG: No results for input(s): GLUCAP in the last 168 hours. Lipid Profile: No results for input(s): CHOL, HDL, LDLCALC, TRIG, CHOLHDL, LDLDIRECT in the last 72 hours. Thyroid Function Tests: No results for input(s): TSH, T4TOTAL, FREET4, T3FREE, THYROIDAB in the last 72 hours. Anemia Panel: No results for input(s): VITAMINB12, FOLATE, FERRITIN, TIBC, IRON, RETICCTPCT in the last 72 hours. Sepsis Labs: Recent Labs  Lab 05/14/20 0949 05/14/20 1150 05/14/20 1402 05/15/20 0531  LATICACIDVEN 4.1* 2.6* 1.4 1.3  Recent Results (from the past 240 hour(s))  Blood culture (routine x 2)     Status: None   Collection Time: 05/10/20  2:13 PM   Specimen: BLOOD  Result Value Ref Range Status   Specimen Description BLOOD LEFT WRIST  Final   Special Requests   Final    BOTTLES DRAWN AEROBIC AND ANAEROBIC Blood Culture adequate volume   Culture   Final    NO GROWTH 5 DAYS Performed at [facility], [address] [place], [place] [address]    Report Status 05/15/2020 FINAL  Final  Blood culture (routine x 2)     Status: None   Collection Time: 05/10/20  2:14 PM   Specimen: BLOOD  Result Value Ref Range Status   Specimen Description BLOOD RIGHT HAND  Final   Special Requests   Final    BOTTLES DRAWN AEROBIC AND ANAEROBIC Blood Culture adequate volume   Culture   Final    NO GROWTH 5 DAYS Performed at [facility], [address] [place], [place] [address]    Report Status 05/15/2020 FINAL  Final  Cath Tip Culture     Status: Abnormal   Collection Time: 05/10/20  2:19 PM   Specimen: Urine, Catheterized; Other  Result Value Ref Range Status   Specimen Description   Final    URINE, CATHETERIZED Performed at [facility], [address], [place], [place] [address]    Special Requests    Final    NONE Performed at [facility], [address], [place], [place] [address]    Culture 50,000 COLONIES/mL PANTOEA SPECIES (A)  Final   Report Status 05/13/2020 FINAL  Final   Organism ID, Bacteria PANTOEA SPECIES (A)  Final      Susceptibility   Pantoea species - MIC*    CEFAZOLIN <=4 SENSITIVE Sensitive     CEFEPIME <=0.12 SENSITIVE Sensitive     CEFTRIAXONE <=0.25 SENSITIVE Sensitive     CIPROFLOXACIN <=0.25 SENSITIVE Sensitive     GENTAMICIN <=1 SENSITIVE Sensitive     IMIPENEM <=0.25 SENSITIVE Sensitive     NITROFURANTOIN 64 INTERMEDIATE Intermediate     TRIMETH/SULFA <=20 SENSITIVE Sensitive     PIP/TAZO 64 INTERMEDIATE Intermediate     * 50,000 COLONIES/mL PANTOEA SPECIES  Blood Culture (routine x 2)     Status: None (Preliminary result)   Collection Time: 05/14/20  7:50 AM   Specimen: BLOOD  Result Value Ref Range Status   Specimen Description BLOOD RIGHT ANTECUBITAL  Final   Special Requests   Final    Blood Culture results may not be optimal due to an inadequate volume of blood received in culture bottles BOTTLES DRAWN AEROBIC ONLY   Culture   Final    NO GROWTH 1 DAY Performed at [facility], [address], [place], [place] [address]    Report Status PENDING  Incomplete  Resp Panel by RT-PCR (Flu A&B, Covid) Nasopharyngeal Swab     Status: None   Collection Time: 05/14/20  7:51 AM   Specimen: Nasopharyngeal Swab; Nasopharyngeal(NP) swabs in vial transport medium  Result Value Ref Range Status   SARS Coronavirus 2 by RT PCR NEGATIVE NEGATIVE Final    Comment: (NOTE) SARS-CoV-2 target nucleic acids are NOT DETECTED.  The SARS-CoV-2 RNA is generally detectable in upper respiratory specimens during the acute phase of infection. The lowest concentration of SARS-CoV-2 viral copies this assay can detect is 138 copies/mL. A negative result does not preclude SARS-Cov-2 infection and should not be used  as the sole basis for treatment or other patient management  decisions. A negative result may occur with  improper specimen collection/handling, submission of specimen other than nasopharyngeal swab, presence of viral mutation(s) within the areas targeted by this assay, and inadequate number of viral copies(<138 copies/mL). A negative result must be combined with clinical observations, patient history, and epidemiological information. The expected result is Negative.  Fact Sheet for Patients:  BloggerCourse.com  Fact Sheet for Healthcare Providers:  SeriousBroker.it  This test is no t yet approved or cleared by the [place] FDA and  has been authorized for detection and/or diagnosis of SARS-CoV-2 by FDA under an Emergency Use Authorization (EUA). This EUA will remain  in effect (meaning this test can be used) for the duration of the COVID-19 declaration under Section 564(b)(1) of the Act, 21 U.S.C.section 360bbb-3(b)(1), unless the authorization is terminated  or revoked sooner.       Influenza A by PCR NEGATIVE NEGATIVE Final   Influenza B by PCR NEGATIVE NEGATIVE Final    Comment: (NOTE) The Xpert Xpress SARS-CoV-2/FLU/RSV plus assay is intended as an aid in the diagnosis of influenza from Nasopharyngeal swab specimens and should not be used as a sole basis for treatment. Nasal washings and aspirates are unacceptable for Xpert Xpress SARS-CoV-2/FLU/RSV testing.  Fact Sheet for Patients: BloggerCourse.com  Fact Sheet for Healthcare Providers: SeriousBroker.it  This test is not yet approved or cleared by the [place] FDA and has been authorized for detection and/or diagnosis of SARS-CoV-2 by FDA under an Emergency Use Authorization (EUA). This EUA will remain in effect (meaning this test can be used) for the duration of the COVID-19 declaration under Section 564(b)(1) of the Act, 21 U.S.C. section 360bbb-3(b)(1), unless the  authorization is terminated or revoked.  Performed at [facility], [address] [place], [place] [address]   Blood Culture (routine x 2)     Status: None (Preliminary result)   Collection Time: 05/14/20  7:55 AM   Specimen: BLOOD  Result Value Ref Range Status   Specimen Description BLOOD BLOOD RIGHT WRIST  Final   Special Requests   Final    Blood Culture adequate volume BOTTLES DRAWN AEROBIC AND ANAEROBIC   Culture   Final    NO GROWTH 1 DAY Performed at [facility], [address] [place], [place] [address]    Report Status PENDING  Incomplete  MRSA PCR Screening     Status: None   Collection Time: 05/14/20  3:08 PM   Specimen: Nasopharyngeal  Result Value Ref Range Status   MRSA by PCR NEGATIVE NEGATIVE Final    Comment:        The GeneXpert MRSA Assay (FDA approved for NASAL specimens only), is one component of a comprehensive MRSA colonization surveillance program. It is not intended to diagnose MRSA infection nor to guide or monitor treatment for MRSA infections. Performed at [facility], [address], [place], [place] [address]          Radiology Studies: CT ABDOMEN PELVIS WO CONTRAST  Result Date: 05/14/2020 CLINICAL DATA:  Right-sided abdominal pain for several months. Acute onset nausea and vomiting yesterday. Several months postop from right colectomy and distal small bowel resection. EXAM: CT ABDOMEN AND PELVIS WITHOUT CONTRAST TECHNIQUE: Multidetector CT imaging of the abdomen and pelvis was performed following the standard protocol without IV contrast. COMPARISON:  05/03/2020 FINDINGS: Lower chest: An ill-defined focal nodular opacity is seen in the anterior left lower lobe which measures 15 mm and  is new since previous study. Hepatobiliary: Heterogeneous pattern of steatosis is again seen. No focal mass visualized on this unenhanced exam. Prior cholecystectomy. No evidence of biliary obstruction. Pancreas: No mass or inflammatory process visualized on  this unenhanced exam. Spleen:  Within normal limits in size. Adrenals/Urinary tract: No evidence of urolithiasis or hydronephrosis. Unremarkable unopacified urinary bladder. Stomach/Bowel: Postop changes from right colectomy and right lower quadrant ileostomy remains stable. No evidence of bowel obstruction, abscess, or acute inflammatory process. Possible umbilical fistula is again seen with several matted small bowel loops just deep to the anterior abdominal wall at the umbilicus. Fluid and gas are noted within the umbilical soft tissues. No evidence of hernia. Vascular/Lymphatic: No pathologically enlarged lymph nodes identified. No evidence of abdominal aortic aneurysm. Reproductive:  No mass or other significant abnormality. Other:  None. Musculoskeletal:  No suspicious bone lesions identified. IMPRESSION: No evidence of bowel obstruction or abscess. 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 No evidence of hernia. Stable hepatic steatosis. New ill-defined nodular opacity in anterior left lower lobe. Differential diagnosis includes inflammatory or infectious etiologies, with neoplasm considered less likely. Recommend follow-up by chest CT in 2-3 months. Electronically Signed   By: Danae Orleans M.D.   On: 05/14/2020 10:41   DG Chest Port 1 View  Result Date: 05/14/2020 CLINICAL DATA:  Pain EXAM: PORTABLE CHEST 1 VIEW COMPARISON:  February 01, 2020 FINDINGS: Lungs are clear. Heart size and pulmonary vascularity are normal. No adenopathy. There is degenerative change in the thoracic spine and in each shoulder. IMPRESSION: Lungs clear.  Cardiac silhouette normal. Electronically Signed   By: Bretta Bang III M.D.   On: 05/14/2020 08:16        Scheduled Meds: . Chlorhexidine Gluconate Cloth  6 each Topical Daily  . heparin  5,000 Units Subcutaneous Q8H  . octreotide  100 mcg Subcutaneous TID  . [START ON 05/16/2020] pantoprazole (PROTONIX) IV  40 mg  Intravenous Q24H   Continuous Infusions: . ampicillin-sulbactam (UNASYN) IV Stopped (05/14/20 2033)  . lactated ringers 125 mL/hr at 05/15/20 1403  . sodium chloride       LOS: 1 day    Time spent: 35 minutes    Pratik Hoover Brunette, DO [facility]  If 7PM-7AM, please contact night-coverage [email or web address] 05/15/2020, 2:15 PM

## 2020-05-15 NOTE — Progress Notes (Signed)
MEDICATION RELATED CONSULT NOTE - INITIAL   Pharmacy Consult for Octreotide Indication: High output enterocutaneous fistula  Allergies  Allergen Reactions  . 5-Alpha Reductase Inhibitors   . Maxipime [Cefepime]     Hives    . Metronidazole     hives  . Acetaminophen-Codeine Rash    Tylenol with Codeine Tylenol #3,    Patient Measurements: Height: 5\' 1"  (154.9 cm) Weight: 65.3 kg (143 lb 15.4 oz) IBW/kg (Calculated) : 47.8  Vital Signs: Temp: 97.6 F (36.4 C) (03/23 1146) Temp Source: Oral (03/23 1146) BP: 81/43 (03/23 1400) Pulse Rate: 87 (03/23 1400) Intake/Output from previous day: 03/22 0701 - 03/23 0700 In: 3260.3 [P.O.:240; I.V.:1878.8; IV Piggyback:1141.5] Out: 4300 [Urine:500; Drains:2850; Stool:950] Intake/Output from this shift: Total I/O In: 3353.5 [P.O.:250; I.V.:893.5; IV Piggyback:2210] Out: 3025 [Urine:250; Drains:2625; Stool:150]  Labs: Recent Labs    05/14/20 0750 05/14/20 0810 05/15/20 0531  WBC 14.4*  --  5.5  HGB 13.2  --  10.6*  HCT 39.7  --  33.0*  PLT 558*  --  329  APTT 39*  --   --   CREATININE 5.30*  --  3.63*  MG  --  2.1  --   ALBUMIN 3.5  --  2.7*  PROT 10.4*  --  7.5  AST 84*  --  82*  ALT 59*  --  54*  ALKPHOS 303*  --  262*  BILITOT 1.0  --  1.2   Estimated Creatinine Clearance: 14.4 mL/min (A) (by C-G formula based on SCr of 3.63 mg/dL (H)).   Microbiology: Recent Results (from the past 720 hour(s))  Blood culture (routine x 2)     Status: None   Collection Time: 05/10/20  2:13 PM   Specimen: BLOOD  Result Value Ref Range Status   Specimen Description BLOOD LEFT WRIST  Final   Special Requests   Final    BOTTLES DRAWN AEROBIC AND ANAEROBIC Blood Culture adequate volume   Culture   Final    NO GROWTH 5 DAYS Performed at [facility], [address], [place], [place]    Report Status 05/15/2020 FINAL  Final  Blood culture (routine x 2)     Status: None   Collection Time: 05/10/20  2:14 PM   Specimen:  BLOOD  Result Value Ref Range Status   Specimen Description BLOOD RIGHT HAND  Final   Special Requests   Final    BOTTLES DRAWN AEROBIC AND ANAEROBIC Blood Culture adequate volume   Culture   Final    NO GROWTH 5 DAYS Performed at [facility], [address] [place], [place]    Report Status 05/15/2020 FINAL  Final  Cath Tip Culture     Status: Abnormal   Collection Time: 05/10/20  2:19 PM   Specimen: Urine, Catheterized; Other  Result Value Ref Range Status   Specimen Description   Final    URINE, CATHETERIZED Performed at [facility], [address], [place], [place]    Special Requests   Final    NONE Performed at [facility], [address], [place], [place]    Culture 50,000 COLONIES/mL PANTOEA SPECIES (A)  Final   Report Status 05/13/2020 FINAL  Final   Organism ID, Bacteria PANTOEA SPECIES (A)  Final      Susceptibility   Pantoea species - MIC*    CEFAZOLIN <=4 SENSITIVE Sensitive     CEFEPIME <=0.12 SENSITIVE Sensitive     CEFTRIAXONE <=0.25 SENSITIVE Sensitive  CIPROFLOXACIN <=0.25 SENSITIVE Sensitive     GENTAMICIN <=1 SENSITIVE Sensitive     IMIPENEM <=0.25 SENSITIVE Sensitive     NITROFURANTOIN 64 INTERMEDIATE Intermediate     TRIMETH/SULFA <=20 SENSITIVE Sensitive     PIP/TAZO 64 INTERMEDIATE Intermediate     * 50,000 COLONIES/mL PANTOEA SPECIES  Blood Culture (routine x 2)     Status: None (Preliminary result)   Collection Time: 05/14/20  7:50 AM   Specimen: BLOOD  Result Value Ref Range Status   Specimen Description BLOOD RIGHT ANTECUBITAL  Final   Special Requests   Final    Blood Culture results may not be optimal due to an inadequate volume of blood received in culture bottles BOTTLES DRAWN AEROBIC ONLY   Culture   Final    NO GROWTH 1 DAY Performed at [facility], [address] [place], [place] [address]    Report Status PENDING  Incomplete  Resp Panel by RT-PCR (Flu A&B, Covid) Nasopharyngeal Swab     Status:  None   Collection Time: 05/14/20  7:51 AM   Specimen: Nasopharyngeal Swab; Nasopharyngeal(NP) swabs in vial transport medium  Result Value Ref Range Status   SARS Coronavirus 2 by RT PCR NEGATIVE NEGATIVE Final    Comment: (NOTE) SARS-CoV-2 target nucleic acids are NOT DETECTED.  The SARS-CoV-2 RNA is generally detectable in upper respiratory specimens during the acute phase of infection. The lowest concentration of SARS-CoV-2 viral copies this assay can detect is 138 copies/mL. A negative result does not preclude SARS-Cov-2 infection and should not be used as the sole basis for treatment or other patient management decisions. A negative result may occur with  improper specimen collection/handling, submission of specimen other than nasopharyngeal swab, presence of viral mutation(s) within the areas targeted by this assay, and inadequate number of viral copies(<138 copies/mL). A negative result must be combined with clinical observations, patient history, and epidemiological information. The expected result is Negative.  Fact Sheet for Patients:  BloggerCourse.com  Fact Sheet for Healthcare Providers:  SeriousBroker.it  This test is no t yet approved or cleared by the [place] FDA and  has been authorized for detection and/or diagnosis of SARS-CoV-2 by FDA under an Emergency Use Authorization (EUA). This EUA will remain  in effect (meaning this test can be used) for the duration of the COVID-19 declaration under Section 564(b)(1) of the Act, 21 U.S.C.section 360bbb-3(b)(1), unless the authorization is terminated  or revoked sooner.       Influenza A by PCR NEGATIVE NEGATIVE Final   Influenza B by PCR NEGATIVE NEGATIVE Final    Comment: (NOTE) The Xpert Xpress SARS-CoV-2/FLU/RSV plus assay is intended as an aid in the diagnosis of influenza from Nasopharyngeal swab specimens and should not be used as a sole basis for  treatment. Nasal washings and aspirates are unacceptable for Xpert Xpress SARS-CoV-2/FLU/RSV testing.  Fact Sheet for Patients: BloggerCourse.com  Fact Sheet for Healthcare Providers: SeriousBroker.it  This test is not yet approved or cleared by the [place] FDA and has been authorized for detection and/or diagnosis of SARS-CoV-2 by FDA under an Emergency Use Authorization (EUA). This EUA will remain in effect (meaning this test can be used) for the duration of the COVID-19 declaration under Section 564(b)(1) of the Act, 21 U.S.C. section 360bbb-3(b)(1), unless the authorization is terminated or revoked.  Performed at [facility], [address] [place], [place] [address]   Blood Culture (routine x 2)     Status: None (Preliminary result)   Collection  Time: 05/14/20  7:55 AM   Specimen: BLOOD  Result Value Ref Range Status   Specimen Description BLOOD BLOOD RIGHT WRIST  Final   Special Requests   Final    Blood Culture adequate volume BOTTLES DRAWN AEROBIC AND ANAEROBIC   Culture   Final    NO GROWTH 1 DAY Performed at [facility], [address], [place], [place] [address]    Report Status PENDING  Incomplete  MRSA PCR Screening     Status: None   Collection Time: 05/14/20  3:08 PM   Specimen: Nasopharyngeal  Result Value Ref Range Status   MRSA by PCR NEGATIVE NEGATIVE Final    Comment:        The GeneXpert MRSA Assay (FDA approved for NASAL specimens only), is one component of a comprehensive MRSA colonization surveillance program. It is not intended to diagnose MRSA infection nor to guide or monitor treatment for MRSA infections. Performed at [facility], [address], [place], [place] [address]     Medical History: Past Medical History:  Diagnosis Date  . Anxiety   . Arthritis   . Colon perforation (HCC)    due to appendicitis  . GERD (gastroesophageal reflux disease)   . Hypertension   . S/P  endoscopy    2008: noncritical Schatzki ring s/p dilation, normal esophagus and stomach, 2003: normal esophagus, s/p Maloney dilation, multiple antral erosions consistent with chronic gastritis, no H.pylori,     Assessment: Patient presented a few days ago with Fevers with IV TPN. PICC line removed 3/18 which she had for 5 months post perforated appendicitis. Patient now with generalized body aches but no fever.  Patient has expressed difficulty keeping things down and increase output from her enterocutaneous fistula. Patient now with dehydration and electrolyte abnormalities. MD asked pharmacy to start octreotide.  Plan:  Octreotide 100mg  SQ TID ~ 3 days Monitor output and clinical course  , BS Elder Cyphers, BCPS Clinical Pharmacist Pager [phone] 05/15/2020,2:07 PM

## 2020-05-16 ENCOUNTER — Inpatient Hospital Stay: Payer: Self-pay

## 2020-05-16 LAB — COMPREHENSIVE METABOLIC PANEL
ALT: 41 U/L (ref 0–44)
AST: 55 U/L — ABNORMAL HIGH (ref 15–41)
Albumin: 2.2 g/dL — ABNORMAL LOW (ref 3.5–5.0)
Alkaline Phosphatase: 189 U/L — ABNORMAL HIGH (ref 38–126)
Anion gap: 8 (ref 5–15)
BUN: 16 mg/dL (ref 6–20)
CO2: 23 mmol/L (ref 22–32)
Calcium: 7.6 mg/dL — ABNORMAL LOW (ref 8.9–10.3)
Chloride: 107 mmol/L (ref 98–111)
Creatinine, Ser: 0.88 mg/dL (ref 0.44–1.00)
GFR, Estimated: >60 mL/min (ref >60)
Glucose, Bld: 137 mg/dL — ABNORMAL HIGH (ref 70–99)
Potassium: 3.6 mmol/L (ref 3.5–5.1)
Sodium: 138 mmol/L (ref 135–145)
Total Bilirubin: 0.6 mg/dL (ref 0.3–1.2)
Total Protein: 6.2 g/dL — ABNORMAL LOW (ref 6.5–8.1)

## 2020-05-16 LAB — GLUCOSE, CAPILLARY
Glucose-Capillary: 139 mg/dL — ABNORMAL HIGH (ref 70–99)
Glucose-Capillary: 105 mg/dL — ABNORMAL HIGH (ref 70–99)
Glucose-Capillary: 117 mg/dL — ABNORMAL HIGH (ref 70–99)

## 2020-05-16 LAB — MAGNESIUM: Magnesium: 1.7 mg/dL (ref 1.7–2.4)

## 2020-05-16 LAB — CBC
HCT: 30 % — ABNORMAL LOW (ref 36.0–46.0)
Hemoglobin: 9.2 g/dL — ABNORMAL LOW (ref 12.0–15.0)
MCH: 27.8 pg (ref 26.0–34.0)
MCHC: 30.7 g/dL (ref 30.0–36.0)
MCV: 90.6 fL (ref 80.0–100.0)
Platelets: 356 10*3/uL (ref 150–400)
RBC: 3.31 MIL/uL — ABNORMAL LOW (ref 3.87–5.11)
RDW: 13.8 % (ref 11.5–15.5)
WBC: 5.7 10*3/uL (ref 4.0–10.5)
nRBC: 0 % (ref 0.0–0.2)

## 2020-05-16 MED ORDER — TRAVASOL 10 % IV SOLN
INTRAVENOUS | Status: AC
  Filled 2020-05-16: qty 936

## 2020-05-16 MED ORDER — INSULIN ASPART 100 UNIT/ML ~~LOC~~ SOLN
0.0000 [IU] | Freq: Four times a day (QID) | SUBCUTANEOUS | Status: DC
  Administered 2020-05-17 – 2020-05-18 (×5): 2 [IU] via SUBCUTANEOUS

## 2020-05-16 MED ORDER — SODIUM CHLORIDE 0.9% FLUSH: 10.0000 mL | INTRAVENOUS | Status: DC | PRN

## 2020-05-16 MED ORDER — TRAVASOL 10 % IV SOLN
INTRAVENOUS | Status: DC
  Filled 2020-05-16 (×2): qty 936

## 2020-05-16 MED ORDER — LACTATED RINGERS IV SOLN: INTRAVENOUS | Status: AC

## 2020-05-16 MED ORDER — SODIUM CHLORIDE 0.9 % IV SOLN
3.0000 g | Freq: Four times a day (QID) | INTRAVENOUS | Status: AC
  Administered 2020-05-16 – 2020-05-21 (×20): 3 g via INTRAVENOUS
  Filled 2020-05-16 (×11): qty 8
  Filled 2020-05-16: qty 3
  Filled 2020-05-16 (×6): qty 8
  Filled 2020-05-16: qty 3
  Filled 2020-05-16 (×2): qty 8

## 2020-05-16 MED ORDER — SODIUM CHLORIDE 0.9% FLUSH
10.0000 mL | Freq: Two times a day (BID) | INTRAVENOUS | Status: DC
  Administered 2020-05-17 – 2020-05-20 (×4): 10 mL

## 2020-05-16 MED ORDER — POTASSIUM CHLORIDE 10 MEQ/100ML IV SOLN
10.0000 meq | INTRAVENOUS | Status: AC
  Administered 2020-05-16 (×4): 10 meq via INTRAVENOUS
  Filled 2020-05-16 (×4): qty 100

## 2020-05-16 NOTE — Progress Notes (Signed)
Subjective: Patient much brighter this morning.  States she had a good night sleeping.  She feels much better.  Objective: Vital signs in last 24 hours: Temp:  [97.2 F (36.2 C)-98.2 F (36.8 C)] 97.4 F (36.3 C) (03/24 0738) Pulse Rate:  [58-103] 67 (03/24 0645) Resp:  [10-40] 21 (03/24 0645) BP: (76-138)/(26-99) 116/49 (03/24 0645) SpO2:  [88 %-100 %] 95 % (03/24 0645) Weight:  [72.9 kg] 72.9 kg (03/24 0524) Last BM Date: 05/15/20  Intake/Output from previous day: 03/23 0701 - 03/24 0700 In: 25852.7 [P.O.:2090; I.V.:4573.3; IV Piggyback:4313.1] Out: 7975 [Urine:1250; Drains:6525; Stool:200] Intake/Output this shift: No intake/output data recorded.  General appearance: alert, cooperative and no distress GI: Soft.  Still with high output enterocutaneous fistula.  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  Fistula located in midportion of surgical scar.    Media Information         Document Information  Photos    05/16/2020 07:46  Attached To:  Hospital Encounter on 05/14/20   Source Information  Franky Macho, MD  [facility]    Lab Results:  Recent Labs    05/15/20 0531 05/16/20 0455  WBC 5.5 5.7  HGB 10.6* 9.2*  HCT 33.0* 30.0*  PLT 329 356   BMET Recent Labs    05/15/20 0531 05/15/20 1334 05/16/20 0455  NA 130*  --  138  K 2.5* 3.4* 3.6  CL 90*  --  107  CO2 24  --  23  GLUCOSE 92  --  137*  BUN 55*  --  16  CREATININE 3.63*  --  0.88  CALCIUM 7.8*  --  7.6*   PT/INR Recent Labs    05/14/20 0750  LABPROT 13.4  INR 1.1    Studies/Results: CT ABDOMEN PELVIS WO CONTRAST  Result Date: 05/14/2020 CLINICAL DATA:  Right-sided abdominal pain for several months. Acute onset nausea and vomiting yesterday. Several months postop from right colectomy and distal small bowel resection. EXAM: CT ABDOMEN AND PELVIS WITHOUT CONTRAST TECHNIQUE: Multidetector CT imaging of the abdomen and pelvis was performed following the standard  protocol without IV contrast. COMPARISON:  05/03/2020 FINDINGS: Lower chest: An ill-defined focal nodular opacity is seen in the anterior left lower lobe which measures 15 mm and is new since previous study. Hepatobiliary: Heterogeneous pattern of steatosis is again seen. No focal mass visualized on this unenhanced exam. Prior cholecystectomy. No evidence of biliary obstruction. Pancreas: No mass or inflammatory process visualized on this unenhanced exam. Spleen:  Within normal limits in size. Adrenals/Urinary tract: No evidence of urolithiasis or hydronephrosis. Unremarkable unopacified urinary bladder. Stomach/Bowel: Postop changes from right colectomy and right lower quadrant ileostomy remains stable. No evidence of bowel obstruction, abscess, or acute inflammatory process. Possible umbilical fistula is again seen with several matted small bowel loops just deep to the anterior abdominal wall at the umbilicus. Fluid and gas are noted within the umbilical soft tissues. No evidence of hernia. Vascular/Lymphatic: No pathologically enlarged lymph nodes identified. No evidence of abdominal aortic aneurysm. Reproductive:  No mass or other significant abnormality. Other:  None. Musculoskeletal:  No suspicious bone lesions identified. IMPRESSION: No evidence of bowel obstruction or abscess. 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 No evidence of hernia. Stable hepatic steatosis. New ill-defined nodular opacity in anterior left lower lobe. Differential diagnosis includes inflammatory or infectious etiologies, with neoplasm considered less likely. Recommend follow-up by chest CT in 2-3 months. Electronically Signed   By: Dietrich Pates.D.  On: 05/14/2020 10:41    Anti-infectives: Anti-infectives (From admission, onward)   Start     Dose/Rate Route Frequency Ordered Stop   05/14/20 2100  Ampicillin-Sulbactam (UNASYN) 3 g in sodium chloride 0.9 % 100 mL IVPB        3  g 200 mL/hr over 30 Minutes Intravenous Every 24 hours 05/14/20 1804     05/14/20 1800  piperacillin-tazobactam (ZOSYN) IVPB 3.375 g  Status:  Discontinued        3.375 g 12.5 mL/hr over 240 Minutes Intravenous Every 12 hours 05/14/20 1305 05/14/20 1804   05/14/20 0915  piperacillin-tazobactam (ZOSYN) IVPB 3.375 g        3.375 g 100 mL/hr over 30 Minutes Intravenous  Once 05/14/20 0901 05/14/20 1055   05/14/20 0900  aztreonam (AZACTAM) 2 g in sodium chloride 0.9 % 100 mL IVPB  Status:  Discontinued        2 g 200 mL/hr over 30 Minutes Intravenous  Once 05/14/20 0854 05/14/20 0859   05/14/20 0900  vancomycin (VANCOCIN) IVPB 1000 mg/200 mL premix  Status:  Discontinued        1,000 mg 200 mL/hr over 60 Minutes Intravenous  Once 05/14/20 0854 05/14/20 0859   05/14/20 0900  vancomycin (VANCOREADY) IVPB 1250 mg/250 mL        1,250 mg 166.7 mL/hr over 90 Minutes Intravenous  Once 05/14/20 0859 05/14/20 1454      Assessment/Plan: Impression: 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  Renal function has returned to normal.  Her blood pressure has improved with the fluid resuscitation and her Neo-Synephrine is being weaned off.  Agree with need for PICC placement today.  We will continue Imodium and Sandostatin.  Would like wound care to return to reattach the ostomy bag.  Discussed with Dr. Sherryll Burger.  Will reach out to a tertiary care center as reconstructive surgery of her abdominal wall and control of her enterocutaneous fistula will be complex.  LOS: 2 days    Franky Macho 05/16/2020

## 2020-05-16 NOTE — Progress Notes (Signed)
PHARMACY - TOTAL PARENTERAL NUTRITION CONSULT NOTE   Indication: enterocutaneous fistula  Patient Measurements: Height: 5\' 1"  (154.9 cm) Weight: 73.4 kg (161 lb 13.1 oz) IBW/kg (Calculated) : 47.8 TPN AdjBW (KG): 52.2 Body mass index is 30.58 kg/m.  Assessment:  60 y.o.femalewith medical history significant ofhistory of perforated appendicitis with partial small bowel resection and ileostomy, gastroesophageal flux disease, hypertension and TPN for 5 months post perforated appendicitis.  She had a recent infected PICC line and fevers approximately 4 to 5 days prior to admission.She was noted to have a positive cath tip demonstrating pantoea species and PICC line removed 3/18. She has had difficulty keeping things down. 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  Glucose / Insulin: 0 units to date Electrolytes: K 3.6, Mg 1.7, MD replaced today. F/U labs in AM Renal: Scr 0.88, improved to baseline today Hepatic: WNL Intake / Output; MIVF: 10,952mls/7975mls. LR at 228mls/hr  Central access: PICC line to be placed 05/16/20 TPN start date: 05/16/20  Nutritional Goals :  Kcal:1208-1328 kcal Protein:95g Fluid:>/= 1.2 L  Goal TPN rate is 60 mL/hr (provides 95 g of protein and 1304 kcals per day.  TPN will provide Protein= 93.6g 374 kcal                            CHO =   187g   636 kcal      Fat(lipids)=        288kcal  Current Nutrition:  Clear liquids  Plan:  Start TPN at 48mL/hr at 1800 Electrolytes in TPN: Na 3mEq/L, K 74mEq/L, Ca 48mEq/L, Mg 40mEq/L, and Phos 33mmol/L. Cl:Ac 1:1 Add standard MVI and trace elements to TPN Initiate Moderate q6h SSI and adjust as needed  Reduce MIVF to 160 mL/hr at 1800 Monitor TPN labs on Mon/Thurs  12m, BS Pharm D, BCPS Clinical Pharmacist Pager [phone] 05/16/2020,11:17 AM

## 2020-05-16 NOTE — Consult Note (Signed)
WOC Nurse Consult Note: Patient receiving care in [facility]. Primary RN, Deveron Furlong, requested assistance with changing Eakin fistula pouch this morning while I was teaching a class and not available to her.  Since returning from teaching, I have reviewed the photo found in the note by Dr. Lovell Sheehan this morning.  I requested additional barrier rings, a bottle of stoma powder, an additional Eakin pouch, and a box of skin prep pads. The Materials Management Director, Sabas Sous, to have these items taken to the patient's room. Then I spoke with Morrie Sheldon via telephone.  She explained that between my instructions, and those of the patient, she was able to place a new pouch this morning after we first spoke.  I reviewed with her how to use the stoma powder and skin prep pads to crust the irritated skin.  I further explained that this crusting technique can be used around the ileostomy if needed. She knows she can reach back out to me if needed, and I can camera in and assist via ELink. Helmut Muster, RN, MSN, CWOCN, CNS-BC, pager [phone]

## 2020-05-16 NOTE — Progress Notes (Signed)
Pharmacy Antibiotic Note  [name] is a 60 y.o. female admitted on 05/14/2020 with Unasyn.  Pharmacy has been consulted for Unasyn dosing. Patient presented a few days ago with Fevers with IV TPN. PICC line removed 3/18 which she had for 5 months post perforated appendicitis. Patient now with generalized body aches but no fever.  CrCl improved, adjust unasyn  Plan: Increase Unasyn 3gm IV q6h F/U Cxs and clinical progress Monitor V/S, labs and levels as indicated  Height: 5\' 1"  (154.9 cm) Weight: 73.4 kg (161 lb 13.1 oz) IBW/kg (Calculated) : 47.8  Temp (24hrs), Avg:97.6 F (36.4 C), Min:97.2 F (36.2 C), Max:98.2 F (36.8 C)  Recent Labs  Lab 05/14/20 0750 05/14/20 0949 05/14/20 1150 05/14/20 1402 05/15/20 0531 05/16/20 0455  WBC 14.4*  --   --   --  5.5 5.7  CREATININE 5.30*  --   --   --  3.63* 0.88  LATICACIDVEN 3.4* 4.1* 2.6* 1.4 1.3  --     Estimated Creatinine Clearance: 63 mL/min (by C-G formula based on SCr of 0.88 mg/dL).    Allergies  Allergen Reactions  . 5-Alpha Reductase Inhibitors   . Maxipime [Cefepime]     Hives    . Metronidazole     hives  . Acetaminophen-Codeine Rash    Tylenol with Codeine Tylenol #3,    Antimicrobials this admission: Vancomycin 3/22 >>3/22 Zosyn 3/22 >> 3/22 Unasyn 3/22>>  Dose adjustments this admission: prn  Microbiology results: 3/22 BCx: ngtd 3/22 UCx: pending 3/22 MRSA PCR is negative 3/18 BCX: ngtd 3/18 PICC line (not Urine) cath tip: 50K CFU/ml Pantoea species s- cefazolin , cefepime, ceftriaxone, cipro septra  I-zosyn   Thank you for allowing pharmacy to be a part of this patient's care.  4/18, BS Elder Cyphers, Loura Back Clinical Pharmacist Pager [phone] 05/16/2020 12:04 PM

## 2020-05-16 NOTE — Progress Notes (Cosign Needed)
Dr. Lovell Sheehan notified Primary RN and Student RN to request wound ostomy RN come change drain dressing due to leaking.  Wound ostomy RN contacted and was unable to come to [facility], but stated would camera in at 12:00 to assist Primary RN with dressing change.  Unfortunately patient's dressing change could not wait until 12:00.  Primary RN, Student RN, and another RN performed the dressing change at 09:00 per patient's request.

## 2020-05-16 NOTE — Progress Notes (Signed)
Peripherally Inserted Central Catheter Placement  The IV Nurse has discussed with the patient and/or persons authorized to consent for the patient, the purpose of this procedure and the potential benefits and risks involved with this procedure.  The benefits include less needle sticks, lab draws from the catheter, and the patient may be discharged home with the catheter. Risks include, but not limited to, infection, bleeding, blood clot (thrombus formation), and puncture of an artery; nerve damage and irregular heartbeat and possibility to perform a PICC exchange if needed/ordered by physician.  Alternatives to this procedure were also discussed.  Bard Power PICC patient education guide, fact sheet on infection prevention and patient information card has been provided to patient /or left at bedside.    PICC Placement Documentation  PICC Double Lumen 05/16/20 PICC Left Basilic 42 cm 0 cm (Active)  Indication for Insertion or Continuance of Line Vasoactive infusions 05/16/20 1900  Exposed Catheter (cm) 0 cm 05/16/20 1900  Site Assessment Clean;Dry;Intact 05/16/20 1900  Lumen #1 Status Flushed;Blood return noted 05/16/20 1900  Lumen #2 Status Flushed;Blood return noted 05/16/20 1900  Dressing Type Transparent 05/16/20 1900  Dressing Status Clean;Dry;Intact 05/16/20 1900  Antimicrobial disc in place? Yes 05/16/20 1900  Dressing Intervention New dressing 05/16/20 1900  Dressing Change Due 05/23/20 05/16/20 1900       Dorena Bodo 05/16/2020, 7:29 PM

## 2020-05-16 NOTE — Progress Notes (Signed)
Initial Nutrition Assessment  DOCUMENTATION CODES:   Obesity unspecified  INTERVENTION:  TPN per pharmacy  Recommend: Sip beverages between meals  Avoid beverages high in simple sugars  NUTRITION DIAGNOSIS:   Inadequate oral intake related to inability to eat as evidenced by NPO status (high output enterocutaneous fistula.  Ileostomy- right side of abdomen. NPO / clear liquids x 5 months).  - addressed with TPN  GOAL:   Patient will meet greater than or equal to 90% of their needs   -progressing with replacement of PICC  MONITOR:     REASON FOR ASSESSMENT:  Consult New TPN/TNA  ASSESSMENT: Patient is a 60 yo female with high output enterocutaneous fistula.  Ileostomy- right side of abdomen. History of perforated appendicitis with partial small bowel resection, GERD, HTN and recent infection associated with PICC line.  Patient to have PICC placed today. TPN initiation @ 60 ml/hr scheduled for 1800. Provides 1304 kcal, 95 gr protein. Standard MVI and trace elements added.  Meal intake clear liquids. Patient has been taking only clear liquids at home. TPN has been primary source of nutrition x 5 months.   Patient reports she is within 3 lb of usual body weight.  Labs reviewed: CBG's-92, 137.   Medications: protonix, Insulin.   Drips: Lactated ringers 200 ml/hr    Intake/Output Summary (Last 24 hours) at 05/16/2020 1606 Last data filed at 05/16/2020 1502 Gross per 24 hour  Intake 8140.31 ml  Output 6300 ml  Net 1840.31 ml  Net-since admission +1.545 ml Drains-850 ml UOP-1100 ml Stool-200 ml   Diet Order:   Diet Order            Diet NPO time specified Except for: Ice Chips  Diet effective now                 EDUCATION NEEDS:  Not appropriate for education at this time  Skin:  Skin Assessment: Skin Integrity Issues: Skin Integrity Issues:: Other (Comment) Other: high output enterocutaneous fistula.  Last BM:  3/23 illeostomy- type 7  Height:    Ht Readings from Last 1 Encounters:  05/14/20 5\' 1"  (1.549 m)    Weight:   Wt Readings from Last 1 Encounters:  05/16/20 73.4 kg    Ideal Body Weight:   48 kg  BMI:  Body mass index is 30.58 kg/m.  Estimated Nutritional Needs:   Kcal:  1375-1500  Protein:  95-105 gr  Fluid:  per MD goals    05/18/20 MS,RD,CSG,LDN Pager: #[phone]

## 2020-05-16 NOTE — Progress Notes (Signed)
I discussed this case with [facility] general surgery.  They say they do not even operate on patients with an enterocutaneous fistula until 1 year after the original surgery.  They suggest continuing bowel rest and TPN.  Discussed with Dr. Sherryll Burger.

## 2020-05-16 NOTE — Progress Notes (Addendum)
PROGRESS NOTE    [name]  JTT:[id number] DOB: [birth year] DOA: 05/14/2020 PCP: Avis Epley, PA-C   Brief Narrative:   Christina Corrigan Sheltonis a 60 y.o.femalewith medical history significant ofhistory of perforated appendicitis with partial small bowel resection and ileostomy, gastroesophageal flux disease, hypertension and recent infected PICC line and fevers approximately 4 to 5 days prior to admission.  She was noted to have a positive cath tip demonstrating pantoea species and was treated empirically with Bactrim.  She has been started on Unasyn empirically on admission.  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  She continues to have a high amount of output with soft blood pressure readings requiring the use of repeat fluid boluses with ongoing aggressive maintenance fluid.  She is currently on Neo-Synephrine.  Assessment & Plan:   Principal Problem:   Acute renal failure (ARF) (HCC) Active Problems:   GERD (gastroesophageal reflux disease)   Enterocutaneous fistula   Lactic acidosis   Dehydration  Prerenal AKI-improved -Improving with aggressive hydration, continue to monitor -Strict I's and O's with good UO noted -Avoid nephrotoxic agents  Hypokalemia-improved -Continue to monitor and replete aggressively  Hypotension-improved -Secondary to high output fistula -Continue aggressive IV fluid hydration with repeat boluses as needed -Currently on Neosyn which will be weaned as tolerated  GERD -Continue PPI twice daily  High output enterocutaneous fistula -Per general surgery -Need to continue aggressive IV fluid with fluid boluses -Sandostatin and Imodium initiated on 3/23 with diminished output -Likely need for transfer to tertiary care per general surgery  Lactic acidosis-resolved -Likely secondary to dehydration  SIRS criteria -Recent catheter tip with pantoea spp -Blood cultures x24  hours with no growth -Continue to monitor cultures -Unasyn empirically for total 7 days; currently day 2  Dehydration with hyponatremia -Continue IV fluid and monitor  Moderate protein calorie malnutrition -Continue with TPN moving forward after PICC can be replaced  DVT prophylaxis: Heparin Code Status: Full Family Communication: Patient will call Disposition Plan:  Status is: Inpatient  Remains inpatient appropriate because:Persistent severe electrolyte disturbances, IV treatments appropriate due to intensity of illness or inability to take PO and Inpatient level of care appropriate due to severity of illness   Dispo: The patient is from: Home  Anticipated d/c is to: Home  Patient currently is not medically stable to d/c.              Difficult to place patient No   Consultants:   General Surgery  Procedures:   See below  Antimicrobials:  Anti-infectives (From admission, onward)   Start     Dose/Rate Route Frequency Ordered Stop   05/14/20 2100  Ampicillin-Sulbactam (UNASYN) 3 g in sodium chloride 0.9 % 100 mL IVPB        3 g 200 mL/hr over 30 Minutes Intravenous Every 24 hours 05/14/20 1804     05/14/20 1800  piperacillin-tazobactam (ZOSYN) IVPB 3.375 g  Status:  Discontinued        3.375 g 12.5 mL/hr over 240 Minutes Intravenous Every 12 hours 05/14/20 1305 05/14/20 1804   05/14/20 0915  piperacillin-tazobactam (ZOSYN) IVPB 3.375 g        3.375 g 100 mL/hr over 30 Minutes Intravenous  Once 05/14/20 0901 05/14/20 1055   05/14/20 0900  aztreonam (AZACTAM) 2 g in sodium chloride 0.9 % 100 mL IVPB  Status:  Discontinued        2 g 200 mL/hr over 30 Minutes Intravenous  Once  05/14/20 0854 05/14/20 0859   05/14/20 0900  vancomycin (VANCOCIN) IVPB 1000 mg/200 mL premix  Status:  Discontinued        1,000 mg 200 mL/hr over 60 Minutes Intravenous  Once 05/14/20 0854 05/14/20 0859   05/14/20 0900  vancomycin (VANCOREADY) IVPB 1250 mg/250  mL        1,250 mg 166.7 mL/hr over 90 Minutes Intravenous  Once 05/14/20 0859 05/14/20 1454       Subjective: Patient seen and evaluated today with no new acute complaints or concerns. No acute concerns or events noted overnight.  Her blood pressures have improved after use of pressor agents.  Objective: Vitals:   05/16/20 0645 05/16/20 0700 05/16/20 0738 05/16/20 0800  BP: (!) 116/49 (!) 128/52  (!) 121/52  Pulse: 67 (!) 58  64  Resp: (!) Temp:   (!) 97.4 F (36.3 C)   TempSrc:   Oral   SpO2: 95% 98%  99%  Weight:      Height:        Intake/Output Summary (Last 24 hours) at 05/16/2020 0852 Last data filed at 05/16/2020 0626 Gross per 24 hour  Intake 10809.88 ml  Output 7975 ml  Net 2834.88 ml   Filed Weights   05/14/20 1510 05/15/20 0500 05/16/20 0524  Weight: 65.4 kg 65.3 kg 72.9 kg    Examination:  General exam: Appears calm and comfortable  Respiratory system: Clear to auscultation. Respiratory effort normal. Cardiovascular system: S1 & S2 heard, RRR.  Gastrointestinal system: Abdomen with fistula as noted previously with images in chart. Central nervous system: Alert and awake Extremities: No edema Skin: No significant lesions noted Psychiatry: Flat affect.    Data Reviewed: I have personally reviewed following labs and imaging studies  CBC: Recent Labs  Lab 05/14/20 0750 05/15/20 0531 05/16/20 0455  WBC 14.4* 5.5 5.7  NEUTROABS 10.8*  --   --   HGB 13.2 10.6* 9.2*  HCT 39.7 33.0* 30.0*  MCV 82.5 86.6 90.6  PLT 558* 329 356   Basic Metabolic Panel: Recent Labs  Lab 05/14/20 0750 05/14/20 0810 05/15/20 0531 05/15/20 1334 05/16/20 0455  NA 123*  --  130*  --  138  K 3.6  --  2.5* 3.4* 3.6  CL 80*  --  90*  --  107  CO2 21*  --  24  --  23  GLUCOSE 176*  --  92  --  137*  BUN 65*  --  55*  --  16  CREATININE 5.30*  --  3.63*  --  0.88  CALCIUM 8.7*  --  7.8*  --  7.6*  MG  --  2.1  --   --  1.7   GFR: Estimated Creatinine  Clearance: 62.8 mL/min (by C-G formula based on SCr of 0.88 mg/dL). Liver Function Tests: Recent Labs  Lab 05/14/20 0750 05/15/20 0531 05/16/20 0455  AST 84* 82* 55*  ALT 59* 54* 41  ALKPHOS 303* 262* 189*  BILITOT 1.0 1.2 0.6  PROT 10.4* 7.5 6.2*  ALBUMIN 3.5 2.7* 2.2*   No results for input(s): LIPASE, AMYLASE in the last 168 hours. No results for input(s): AMMONIA in the last 168 hours. Coagulation Profile: Recent Labs  Lab 05/14/20 0750  INR 1.1   Cardiac Enzymes: Recent Labs  Lab 05/14/20 0750  CKTOTAL 39   BNP (last 3 results) No results for input(s): PROBNP in the last 8760 hours. HbA1C: No results for input(s): HGBA1C in  the last 72 hours. CBG: No results for input(s): GLUCAP in the last 168 hours. Lipid Profile: No results for input(s): CHOL, HDL, LDLCALC, TRIG, CHOLHDL, LDLDIRECT in the last 72 hours. Thyroid Function Tests: No results for input(s): TSH, T4TOTAL, FREET4, T3FREE, THYROIDAB in the last 72 hours. Anemia Panel: No results for input(s): VITAMINB12, FOLATE, FERRITIN, TIBC, IRON, RETICCTPCT in the last 72 hours. Sepsis Labs: Recent Labs  Lab 05/14/20 0949 05/14/20 1150 05/14/20 1402 05/15/20 0531  LATICACIDVEN 4.1* 2.6* 1.4 1.3    Recent Results (from the past 240 hour(s))  Blood culture (routine x 2)     Status: None   Collection Time: 05/10/20  2:13 PM   Specimen: BLOOD  Result Value Ref Range Status   Specimen Description BLOOD LEFT WRIST  Final   Special Requests   Final    BOTTLES DRAWN AEROBIC AND ANAEROBIC Blood Culture adequate volume   Culture   Final    NO GROWTH 5 DAYS Performed at [facility], [address] [place], [place] [address]    Report Status 05/15/2020 FINAL  Final  Blood culture (routine x 2)     Status: None   Collection Time: 05/10/20  2:14 PM   Specimen: BLOOD  Result Value Ref Range Status   Specimen Description BLOOD RIGHT HAND  Final   Special Requests   Final    BOTTLES DRAWN AEROBIC AND ANAEROBIC  Blood Culture adequate volume   Culture   Final    NO GROWTH 5 DAYS Performed at [facility], [address] [place], [place] [address]    Report Status 05/15/2020 FINAL  Final  Cath Tip Culture     Status: Abnormal   Collection Time: 05/10/20  2:19 PM   Specimen: Catheter Tip; Other  Result Value Ref Range Status   Specimen Description   Final    CATH TIP Performed at [facility], [address] [place], [place] [address]    Special Requests   Final    NONE Performed at [facility], [address] [place], [place] [address]    Culture 50,000 COLONIES/mL PANTOEA SPECIES (A)  Final   Report Status 05/13/2020 FINAL  Final   Organism ID, Bacteria PANTOEA SPECIES (A)  Final      Susceptibility   Pantoea species - MIC*    CEFAZOLIN <=4 SENSITIVE Sensitive     CEFEPIME <=0.12 SENSITIVE Sensitive     CEFTRIAXONE <=0.25 SENSITIVE Sensitive     CIPROFLOXACIN <=0.25 SENSITIVE Sensitive     GENTAMICIN <=1 SENSITIVE Sensitive     IMIPENEM <=0.25 SENSITIVE Sensitive     NITROFURANTOIN 64 INTERMEDIATE Intermediate     TRIMETH/SULFA <=20 SENSITIVE Sensitive     PIP/TAZO 64 INTERMEDIATE Intermediate     * 50,000 COLONIES/mL PANTOEA SPECIES  Blood Culture (routine x 2)     Status: None (Preliminary result)   Collection Time: 05/14/20  7:50 AM   Specimen: BLOOD  Result Value Ref Range Status   Specimen Description BLOOD RIGHT ANTECUBITAL  Final   Special Requests   Final    Blood Culture results may not be optimal due to an inadequate volume of blood received in culture bottles BOTTLES DRAWN AEROBIC ONLY   Culture   Final    NO GROWTH 2 DAYS Performed at [facility], [address] [place], [place] [address]    Report Status PENDING  Incomplete  Resp Panel by RT-PCR (Flu A&B, Covid) Nasopharyngeal Swab     Status: None  Collection Time: 05/14/20  7:51 AM   Specimen: Nasopharyngeal Swab; Nasopharyngeal(NP) swabs in vial transport medium  Result Value Ref Range Status   SARS  Coronavirus 2 by RT PCR NEGATIVE NEGATIVE Final    Comment: (NOTE) SARS-CoV-2 target nucleic acids are NOT DETECTED.  The SARS-CoV-2 RNA is generally detectable in upper respiratory specimens during the acute phase of infection. The lowest concentration of SARS-CoV-2 viral copies this assay can detect is 138 copies/mL. A negative result does not preclude SARS-Cov-2 infection and should not be used as the sole basis for treatment or other patient management decisions. A negative result may occur with  improper specimen collection/handling, submission of specimen other than nasopharyngeal swab, presence of viral mutation(s) within the areas targeted by this assay, and inadequate number of viral copies(<138 copies/mL). A negative result must be combined with clinical observations, patient history, and epidemiological information. The expected result is Negative.  Fact Sheet for Patients:  BloggerCourse.com  Fact Sheet for Healthcare Providers:  SeriousBroker.it  This test is no t yet approved or cleared by the [place] FDA and  has been authorized for detection and/or diagnosis of SARS-CoV-2 by FDA under an Emergency Use Authorization (EUA). This EUA will remain  in effect (meaning this test can be used) for the duration of the COVID-19 declaration under Section 564(b)(1) of the Act, 21 U.S.C.section 360bbb-3(b)(1), unless the authorization is terminated  or revoked sooner.       Influenza A by PCR NEGATIVE NEGATIVE Final   Influenza B by PCR NEGATIVE NEGATIVE Final    Comment: (NOTE) The Xpert Xpress SARS-CoV-2/FLU/RSV plus assay is intended as an aid in the diagnosis of influenza from Nasopharyngeal swab specimens and should not be used as a sole basis for treatment. Nasal washings and aspirates are unacceptable for Xpert Xpress SARS-CoV-2/FLU/RSV testing.  Fact Sheet for  Patients: BloggerCourse.com  Fact Sheet for Healthcare Providers: SeriousBroker.it  This test is not yet approved or cleared by the [place] FDA and has been authorized for detection and/or diagnosis of SARS-CoV-2 by FDA under an Emergency Use Authorization (EUA). This EUA will remain in effect (meaning this test can be used) for the duration of the COVID-19 declaration under Section 564(b)(1) of the Act, 21 U.S.C. section 360bbb-3(b)(1), unless the authorization is terminated or revoked.  Performed at [facility], [address], [place], [place] [address]   Blood Culture (routine x 2)     Status: None (Preliminary result)   Collection Time: 05/14/20  7:55 AM   Specimen: BLOOD  Result Value Ref Range Status   Specimen Description BLOOD BLOOD RIGHT WRIST  Final   Special Requests   Final    Blood Culture adequate volume BOTTLES DRAWN AEROBIC AND ANAEROBIC   Culture   Final    NO GROWTH 2 DAYS Performed at [facility], [address], [place], [place] [address]    Report Status PENDING  Incomplete  MRSA PCR Screening     Status: None   Collection Time: 05/14/20  3:08 PM   Specimen: Nasopharyngeal  Result Value Ref Range Status   MRSA by PCR NEGATIVE NEGATIVE Final    Comment:        The GeneXpert MRSA Assay (FDA approved for NASAL specimens only), is one component of a comprehensive MRSA colonization surveillance program. It is not intended to diagnose MRSA infection nor to guide or monitor treatment for MRSA infections. Performed at [facility], [address], [place], [place] [address]  Radiology Studies: CT ABDOMEN PELVIS WO CONTRAST  Result Date: 05/14/2020 CLINICAL DATA:  Right-sided abdominal pain for several months. Acute onset nausea and vomiting yesterday. Several months postop from right colectomy and distal small bowel resection. EXAM: CT ABDOMEN AND PELVIS WITHOUT CONTRAST TECHNIQUE:  Multidetector CT imaging of the abdomen and pelvis was performed following the standard protocol without IV contrast. COMPARISON:  05/03/2020 FINDINGS: Lower chest: An ill-defined focal nodular opacity is seen in the anterior left lower lobe which measures 15 mm and is new since previous study. Hepatobiliary: Heterogeneous pattern of steatosis is again seen. No focal mass visualized on this unenhanced exam. Prior cholecystectomy. No evidence of biliary obstruction. Pancreas: No mass or inflammatory process visualized on this unenhanced exam. Spleen:  Within normal limits in size. Adrenals/Urinary tract: No evidence of urolithiasis or hydronephrosis. Unremarkable unopacified urinary bladder. Stomach/Bowel: Postop changes from right colectomy and right lower quadrant ileostomy remains stable. No evidence of bowel obstruction, abscess, or acute inflammatory process. Possible umbilical fistula is again seen with several matted small bowel loops just deep to the anterior abdominal wall at the umbilicus. Fluid and gas are noted within the umbilical soft tissues. No evidence of hernia. Vascular/Lymphatic: No pathologically enlarged lymph nodes identified. No evidence of abdominal aortic aneurysm. Reproductive:  No mass or other significant abnormality. Other:  None. Musculoskeletal:  No suspicious bone lesions identified. IMPRESSION: No evidence of bowel obstruction or abscess. 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 No evidence of hernia. Stable hepatic steatosis. New ill-defined nodular opacity in anterior left lower lobe. Differential diagnosis includes inflammatory or infectious etiologies, with neoplasm considered less likely. Recommend follow-up by chest CT in 2-3 months. Electronically Signed   By: Danae Orleans M.D.   On: 05/14/2020 10:41   [place] EKG SITE RITE  Result Date: 05/16/2020 If Site Rite image not attached, placement could not be confirmed due to current  cardiac rhythm.       Scheduled Meds: . Chlorhexidine Gluconate Cloth  6 each Topical Daily  . heparin  5,000 Units Subcutaneous Q8H  . octreotide  100 mcg Subcutaneous TID  . pantoprazole (PROTONIX) IV  40 mg Intravenous Q24H   Continuous Infusions: . sodium chloride    . ampicillin-sulbactam (UNASYN) IV Stopped (05/15/20 2218)  . lactated ringers 200 mL/hr at 05/16/20 2993  . phenylephrine (NEO-SYNEPHRINE) Adult infusion 35 mcg/min (05/16/20 0626)     LOS: 2 days    Time spent: 35 minutes    Deriana Vanderhoef D Sherryll Burger, DO [facility]  If 7PM-7AM, please contact night-coverage [email or web address] 05/16/2020, 8:52 AM

## 2020-05-16 NOTE — Progress Notes (Cosign Needed)
Notified by hospitalist that patient may be transferred to [facility] for reconstructive surgery.  [place] [facility] the [facility] transfer RN called and requested [place] fax a face sheet for patient.  Face sheet faxed to [phone].

## 2020-05-17 LAB — TRIGLYCERIDES: Triglycerides: 151 mg/dL — ABNORMAL HIGH (ref <150)

## 2020-05-17 LAB — DIFFERENTIAL
Abs Immature Granulocytes: 0.03 10*3/uL (ref 0.00–0.07)
Basophils Absolute: 0 10*3/uL (ref 0.0–0.1)
Basophils Relative: 1 %
Eosinophils Absolute: 0.2 10*3/uL (ref 0.0–0.5)
Eosinophils Relative: 3 %
Immature Granulocytes: 1 %
Lymphocytes Relative: 29 %
Lymphs Abs: 1.3 10*3/uL (ref 0.7–4.0)
Monocytes Absolute: 0.5 10*3/uL (ref 0.1–1.0)
Monocytes Relative: 10 %
Neutro Abs: 2.5 10*3/uL (ref 1.7–7.7)
Neutrophils Relative %: 56 %

## 2020-05-17 LAB — COMPREHENSIVE METABOLIC PANEL
ALT: 36 U/L (ref 0–44)
AST: 51 U/L — ABNORMAL HIGH (ref 15–41)
Albumin: 2 g/dL — ABNORMAL LOW (ref 3.5–5.0)
Alkaline Phosphatase: 152 U/L — ABNORMAL HIGH (ref 38–126)
Anion gap: 8 (ref 5–15)
BUN: 11 mg/dL (ref 6–20)
CO2: 26 mmol/L (ref 22–32)
Calcium: 7.9 mg/dL — ABNORMAL LOW (ref 8.9–10.3)
Chloride: 105 mmol/L (ref 98–111)
Creatinine, Ser: 0.68 mg/dL (ref 0.44–1.00)
GFR, Estimated: >60 mL/min (ref >60)
Glucose, Bld: 130 mg/dL — ABNORMAL HIGH (ref 70–99)
Potassium: 3.9 mmol/L (ref 3.5–5.1)
Sodium: 139 mmol/L (ref 135–145)
Total Bilirubin: 0.5 mg/dL (ref 0.3–1.2)
Total Protein: 6 g/dL — ABNORMAL LOW (ref 6.5–8.1)

## 2020-05-17 LAB — CBC
HCT: 27.7 % — ABNORMAL LOW (ref 36.0–46.0)
Hemoglobin: 8.4 g/dL — ABNORMAL LOW (ref 12.0–15.0)
MCH: 28 pg (ref 26.0–34.0)
MCHC: 30.3 g/dL (ref 30.0–36.0)
MCV: 92.3 fL (ref 80.0–100.0)
Platelets: 267 10*3/uL (ref 150–400)
RBC: 3 MIL/uL — ABNORMAL LOW (ref 3.87–5.11)
RDW: 13.5 % (ref 11.5–15.5)
WBC: 4.5 10*3/uL (ref 4.0–10.5)
nRBC: 0 % (ref 0.0–0.2)

## 2020-05-17 LAB — PHOSPHORUS: Phosphorus: 2.1 mg/dL — ABNORMAL LOW (ref 2.5–4.6)

## 2020-05-17 LAB — GLUCOSE, CAPILLARY
Glucose-Capillary: 142 mg/dL — ABNORMAL HIGH (ref 70–99)
Glucose-Capillary: 143 mg/dL — ABNORMAL HIGH (ref 70–99)
Glucose-Capillary: 102 mg/dL — ABNORMAL HIGH (ref 70–99)

## 2020-05-17 LAB — URINE CULTURE: Culture: 50000 — AB

## 2020-05-17 LAB — PREALBUMIN: Prealbumin: 12.4 mg/dL — ABNORMAL LOW (ref 18–38)

## 2020-05-17 LAB — MAGNESIUM: Magnesium: 1.5 mg/dL — ABNORMAL LOW (ref 1.7–2.4)

## 2020-05-17 MED ORDER — POTASSIUM PHOSPHATES 15 MMOLE/5ML IV SOLN
15.0000 mmol | Freq: Once | INTRAVENOUS | Status: AC
  Administered 2020-05-17: 15 mmol via INTRAVENOUS
  Filled 2020-05-17: qty 5

## 2020-05-17 MED ORDER — MAGNESIUM SULFATE 2 GM/50ML IV SOLN
2.0000 g | Freq: Once | INTRAVENOUS | Status: AC
  Administered 2020-05-17: 2 g via INTRAVENOUS
  Filled 2020-05-17: qty 50

## 2020-05-17 MED ORDER — TRAVASOL 10 % IV SOLN
INTRAVENOUS | Status: AC
  Filled 2020-05-17: qty 960

## 2020-05-17 MED ORDER — LACTATED RINGERS IV SOLN: INTRAVENOUS | Status: DC

## 2020-05-17 NOTE — Progress Notes (Signed)
PHARMACY - TOTAL PARENTERAL NUTRITION CONSULT NOTE   Indication: enterocutaneous fistula  Patient Measurements: Height: _0  (154.9 cm) Weight: 73.2 kg (161 lb 6 oz) IBW/kg (Calculated) : 47.8 TPN AdjBW (KG): 52.2 Body mass index is 30.49 kg/m.  Assessment:  60 y.o.femalewith medical history significant ofhistory of perforated appendicitis with partial small bowel resection and ileostomy, gastroesophageal flux disease, hypertension and TPN for 5 months post perforated appendicitis.  She had a recent infected PICC line and fevers approximately 4 to 5 days prior to admission.She was noted to have a positive cath tip demonstrating pantoea species and PICC line removed 3/18. She has had difficulty keeping things down. 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   Glucose / Insulin: 105-142: 2 units insulin given Electrolytes: Mag 1.5   Phos 2.1 Renal: stable- Scr 0.68 Hepatic: slightly elevated- AST 51  Alk phos 152 TG 151 Intake / Output; MIVF:  LR at 154ms/hr  Central access: PICC line placed 05/16/20 TPN start date: 05/16/20  Nutritional Goals per RD :  Kcal:  1375-1500 Protein:  95-105 gr Fluid:  per MD goals   Current Nutrition:  NPO  Plan:  Potassium phosphate 15 mmol x 1 dose.  Increase TPN to 867mhr at 1800 Electrolytes in TPN: Na 3561mL, K 87m18m, Ca 5mEq46m Mg 10mEq82mand Phos 15mmol77mCl:Ac 1:2 Add standard MVI and trace elements to TPN Initiate Moderate q6h SSI and adjust as needed Decrease LR infusion to 80 mL/hr at 1800  Monitor TPN labs on Mon/Thurs  Steven Margot AblesD Clinical Pharmacist 05/17/2020 9:02 AM

## 2020-05-17 NOTE — Progress Notes (Addendum)
At 11:20 AM Primary RN and Student-RN changed patient's midline abdominal dressing per Dr. Lovell Sheehan' request. A new closed system drainage bag was placed and secured with tape along the edges.  The drainage bag was then attached to the closed system.  At this time the bag is below bed level, with no kinks or dependent loops present   Vista Deck, Student-RN 05/17/2020, 12:01

## 2020-05-17 NOTE — Progress Notes (Signed)
Received referral during rounding today from patient nurse that she was tearful due to learning that she would have to wait for reconstruction surgery. Introduced self and engaged her in reflection around the history of her illness. She shared how since October 2021 she has been in/out of the hospital and once for 4 months. She realizes how serious her illness was and had no idea it would take this much from her life. She was tearful today about the changes in her body (I.e. weight loss, hair loss) and disfigurement due to open wound in her abdomen. She shared her spiritual journey thus far and how deeply this illness has affected her emotionally/spiritually. Chaplain provided opportunity for reflection, affirmation of her faith, encouragement, and prayer today. She stated that she felt much better and requests ongoing spiritual support during her stay. Chaplain will continue to visit in order to provide spiritual support and to assess for spiritual need.

## 2020-05-17 NOTE — Progress Notes (Addendum)
During morning med administration, patient exhibited signs of emotional distress r/t to news she received from her visit with MD.  Charisse March provided emotional support to pt.  Student-RN consulted Chaplain. Chaplain at bedside.   Vista Deck, Student-RN 05/17/2020 09:29 AM

## 2020-05-17 NOTE — Progress Notes (Signed)
  Subjective: Patient feels fatigued.  She is disappointed to hear that there is no immediate surgical reconstruction of her enterocutaneous fistula.  Objective: Vital signs in last 24 hours: Temp:  [97.7 F (36.5 C)-98.3 F (36.8 C)] 98.3 F (36.8 C) (03/25 0731) Pulse Rate:  [64-98] 81 (03/25 0900) Resp:  [8-28] 14 (03/25 0900) BP: (84-123)/(25-69) 123/54 (03/25 0900) SpO2:  [92 %-100 %] 100 % (03/25 0900) Weight:  [73.2 kg-73.4 kg] 73.2 kg (03/25 0606) Last BM Date: 05/16/20  Intake/Output from previous day: 03/24 0701 - 03/25 0700 In: 4641.3 [P.O.:880; I.V.:3112.8; IV Piggyback:648.5] Out: 5625 [Urine:3500; Drains:2125] Intake/Output this shift: Total I/O In: 593.8 [I.V.:493.8; IV Piggyback:100] Out: 800 [Urine:800]  General appearance: alert, cooperative and no distress GI: Soft, ileostomy and enterocutaneous fistula appliances in place.  Lab Results:  Recent Labs    05/16/20 0455 05/17/20 0408  WBC 5.7 4.5  HGB 9.2* 8.4*  HCT 30.0* 27.7*  PLT 356 267   BMET Recent Labs    05/16/20 0455 05/17/20 0408  NA 138 139  K 3.6 3.9  CL 107 105  CO2 23 26  GLUCOSE 137* 130*  BUN 16 11  CREATININE 0.88 0.68  CALCIUM 7.6* 7.9*   PT/INR No results for input(s): LABPROT, INR in the last 72 hours.  Studies/Results: [place] EKG SITE RITE  Result Date: 05/16/2020 If Site Rite image not attached, placement could not be confirmed due to current cardiac rhythm.   Anti-infectives: Anti-infectives (From admission, onward)   Start     Dose/Rate Route Frequency Ordered Stop   05/16/20 1215  Ampicillin-Sulbactam (UNASYN) 3 g in sodium chloride 0.9 % 100 mL IVPB        3 g 200 mL/hr over 30 Minutes Intravenous Every 6 hours 05/16/20 1204     05/14/20 2100  Ampicillin-Sulbactam (UNASYN) 3 g in sodium chloride 0.9 % 100 mL IVPB  Status:  Discontinued        3 g 200 mL/hr over 30 Minutes Intravenous Every 24 hours 05/14/20 1804 05/16/20 1204   05/14/20 1800   piperacillin-tazobactam (ZOSYN) IVPB 3.375 g  Status:  Discontinued        3.375 g 12.5 mL/hr over 240 Minutes Intravenous Every 12 hours 05/14/20 1305 05/14/20 1804   05/14/20 0915  piperacillin-tazobactam (ZOSYN) IVPB 3.375 g        3.375 g 100 mL/hr over 30 Minutes Intravenous  Once 05/14/20 0901 05/14/20 1055   05/14/20 0900  aztreonam (AZACTAM) 2 g in sodium chloride 0.9 % 100 mL IVPB  Status:  Discontinued        2 g 200 mL/hr over 30 Minutes Intravenous  Once 05/14/20 0854 05/14/20 0859   05/14/20 0900  vancomycin (VANCOCIN) IVPB 1000 mg/200 mL premix  Status:  Discontinued        1,000 mg 200 mL/hr over 60 Minutes Intravenous  Once 05/14/20 0854 05/14/20 0859   05/14/20 0900  vancomycin (VANCOREADY) IVPB 1250 mg/250 mL        1,250 mg 166.7 mL/hr over 90 Minutes Intravenous  Once 05/14/20 0859 05/14/20 1454      Assessment/Plan: Impression: Overall improved.  Renal function within normal limits.  She does have multiple electrolyte abnormalities which are being addressed with TPN.  Her PICC line has been placed.  Would continue IV antibiotic as per hospitalist.  We will then reestablish home health to continue TPN and n.p.o. status.  LOS: 3 days    Franky Macho 05/17/2020

## 2020-05-17 NOTE — Progress Notes (Addendum)
PROGRESS NOTE    Melton KrebsSusan J Spencer  ZOX:[id number] DOB: 1960-04-17 DOA: 05/14/2020 PCP: Avis EpleyJackson, Samantha J, PA-C   Brief Narrative:   Christina HolsteinSusan J Sheltonis a 60 y.o.femalewith medical history significant ofhistory of perforated appendicitis with partial small bowel resection and ileostomy, gastroesophageal flux disease, hypertension and recent infected PICC line and fevers approximately 4 to 5 days prior to admission.She was noted to have a positive cath tip demonstrating pantoea speciesand was treated empirically with Bactrim. She has been started on Unasyn empirically on admission. 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 She continues to have a high amount of output with soft blood pressure readings requiring the use of repeat fluid boluses with ongoing aggressive maintenance fluid.   She has been weaned off of her pressors and requires TPN.  Assessment & Plan:   Principal Problem:   Acute renal failure (ARF) (HCC) Active Problems:   GERD (gastroesophageal reflux disease)   Enterocutaneous fistula   Lactic acidosis   Dehydration   Prerenal AKI-improving -Improving with aggressive hydration, continue to monitor -Strict I's and O's with good UO noted -Avoid nephrotoxic agents  Hypokalemia-improved -Continue to monitor and replete aggressively  Hypomagnesemia -Replete and follow  Hypotension-improved -Continues to require careful monitoring -Secondary to high output fistula -Continue aggressive IV fluid hydration with repeat boluses as needed -Weaned off of phenylephrine 3/24  GERD -Continue PPI IV daily  High output enterocutaneous fistula -Per general surgery -Need to continue aggressive IV fluid with fluid boluses -Sandostatin and Imodium initiated on 3/23 with diminished output -Continues to require TPN and is n.p.o. -Per general surgery, reconstructive surgery cannot be performed until 1 year after  the initial surgery -May require LTAC placement if output drainage continues  Lactic acidosis-resolved -Likely secondary to dehydration  SIRS criteria -Recent catheter tip withpantoea spp -Blood cultures x24hours with no growth -Continue to monitor cultures -Unasyn for total 7 days; currently day 3  Dehydration with hyponatremia -Continue IV fluid and monitor  Moderate protein calorie malnutrition -Continue with TPN moving forward afterPICC can be replaced  DVT prophylaxis:Heparin Code Status:Full Family Communication:Patient will call Disposition Plan: Status is: Inpatient  Remains inpatient appropriate because:Persistent severe electrolyte disturbances, IV treatments appropriate due to intensity of illness or inability to take PO and Inpatient level of care appropriate due to severity of illness   Dispo: The patient is from:Home Anticipated d/c is WJ:XBJYto:Home with home health versus LTAC Patient currently is not medically stable to d/c. Difficult to place patient No   Consultants:  General Surgery  Procedures:  See below  Antimicrobials:  Anti-infectives (From admission, onward)   Start     Dose/Rate Route Frequency Ordered Stop   05/16/20 1215  Ampicillin-Sulbactam (UNASYN) 3 g in sodium chloride 0.9 % 100 mL IVPB        3 g 200 mL/hr over 30 Minutes Intravenous Every 6 hours 05/16/20 1204     05/14/20 2100  Ampicillin-Sulbactam (UNASYN) 3 g in sodium chloride 0.9 % 100 mL IVPB  Status:  Discontinued        3 g 200 mL/hr over 30 Minutes Intravenous Every 24 hours 05/14/20 1804 05/16/20 1204   05/14/20 1800  piperacillin-tazobactam (ZOSYN) IVPB 3.375 g  Status:  Discontinued        3.375 g 12.5 mL/hr over 240 Minutes Intravenous Every 12 hours 05/14/20 1305 05/14/20 1804   05/14/20 0915  piperacillin-tazobactam (ZOSYN) IVPB 3.375 g        3.375 g 100  mL/hr over 30 Minutes Intravenous  Once 05/14/20 0901  05/14/20 1055   05/14/20 0900  aztreonam (AZACTAM) 2 g in sodium chloride 0.9 % 100 mL IVPB  Status:  Discontinued        2 g 200 mL/hr over 30 Minutes Intravenous  Once 05/14/20 0854 05/14/20 0859   05/14/20 0900  vancomycin (VANCOCIN) IVPB 1000 mg/200 mL premix  Status:  Discontinued        1,000 mg 200 mL/hr over 60 Minutes Intravenous  Once 05/14/20 0854 05/14/20 0859   05/14/20 0900  vancomycin (VANCOREADY) IVPB 1250 mg/250 mL        1,250 mg 166.7 mL/hr over 90 Minutes Intravenous  Once 05/14/20 0859 05/14/20 1454       Subjective: Patient seen and evaluated today and is quite disappointed to hear that she cannot have surgery for several more months.  Objective: Vitals:   05/17/20 0900 05/17/20 1000 05/17/20 1100 05/17/20 1200  BP: (!) 123/54 (!) 132/49 (!) 97/39 (!) 108/58  Pulse: 81 85 69 81  Resp: 14 (!) 22 12 16   Temp:   97.6 F (36.4 C)   TempSrc:   Oral   SpO2: 100% 96% 100% 100%  Weight:      Height:        Intake/Output Summary (Last 24 hours) at 05/17/2020 1215 Last data filed at 05/17/2020 1150 Gross per 24 hour  Intake 4203.97 ml  Output 6225 ml  Net -2021.03 ml   Filed Weights   05/16/20 0524 05/16/20 1047 05/17/20 0606  Weight: 72.9 kg 73.4 kg 73.2 kg    Examination:  General exam: Appears calm and comfortable  Respiratory system: Clear to auscultation. Respiratory effort normal. Cardiovascular system: S1 & S2 heard, RRR.  Gastrointestinal system: Abdomen with fistula and ostomy bags noted with ongoing succus Central nervous system: Alert and awake Extremities: No edema Skin: No significant lesions noted Psychiatry: Flat affect.    Data Reviewed: I have personally reviewed following labs and imaging studies  CBC: Recent Labs  Lab 05/14/20 0750 05/15/20 0531 05/16/20 0455 05/17/20 0408  WBC 14.4* 5.5 5.7 4.5  NEUTROABS 10.8*  --   --  2.5  HGB 13.2 10.6* 9.2* 8.4*  HCT 39.7 33.0* 30.0* 27.7*  MCV 82.5 86.6 90.6 92.3  PLT 558* 329  356 267   Basic Metabolic Panel: Recent Labs  Lab 05/14/20 0750 05/14/20 0810 05/15/20 0531 05/15/20 1334 05/16/20 0455 05/17/20 0408  NA 123*  --  130*  --  138 139  K 3.6  --  2.5* 3.4* 3.6 3.9  CL 80*  --  90*  --  107 105  CO2 21*  --  24  --  23 26  GLUCOSE 176*  --  92  --  137* 130*  BUN 65*  --  55*  --  16 11  CREATININE 5.30*  --  3.63*  --  0.88 0.68  CALCIUM 8.7*  --  7.8*  --  7.6* 7.9*  MG  --  2.1  --   --  1.7 1.5*  PHOS  --   --   --   --   --  2.1*   GFR: Estimated Creatinine Clearance: 69.3 mL/min (by C-G formula based on SCr of 0.68 mg/dL). Liver Function Tests: Recent Labs  Lab 05/14/20 0750 05/15/20 0531 05/16/20 0455 05/17/20 0408  AST 84* 82* 55* 51*  ALT 59* 54* 41 36  ALKPHOS 303* 262* 189* 152*  BILITOT 1.0 1.2 0.6  0.5  PROT 10.4* 7.5 6.2* 6.0*  ALBUMIN 3.5 2.7* 2.2* 2.0*   No results for input(s): LIPASE, AMYLASE in the last 168 hours. No results for input(s): AMMONIA in the last 168 hours. Coagulation Profile: Recent Labs  Lab 05/14/20 0750  INR 1.1   Cardiac Enzymes: Recent Labs  Lab 05/14/20 0750  CKTOTAL 39   BNP (last 3 results) No results for input(s): PROBNP in the last 8760 hours. HbA1C: No results for input(s): HGBA1C in the last 72 hours. CBG: Recent Labs  Lab 05/16/20 1120 05/16/20 1730 05/16/20 2311 05/17/20 0603 05/17/20 1107  GLUCAP 139* 105* 117* 142* 143*   Lipid Profile: Recent Labs    05/17/20 0408  TRIG 151*   Thyroid Function Tests: No results for input(s): TSH, T4TOTAL, FREET4, T3FREE, THYROIDAB in the last 72 hours. Anemia Panel: No results for input(s): VITAMINB12, FOLATE, FERRITIN, TIBC, IRON, RETICCTPCT in the last 72 hours. Sepsis Labs: Recent Labs  Lab 05/14/20 0949 05/14/20 1150 05/14/20 1402 05/15/20 0531  LATICACIDVEN 4.1* 2.6* 1.4 1.3    Recent Results (from the past 240 hour(s))  Blood culture (routine x 2)     Status: None   Collection Time: 05/10/20  2:13 PM    Specimen: BLOOD  Result Value Ref Range Status   Specimen Description BLOOD LEFT WRIST  Final   Special Requests   Final    BOTTLES DRAWN AEROBIC AND ANAEROBIC Blood Culture adequate volume   Culture   Final    NO GROWTH 5 DAYS Performed at [facility], [address], [place], [place] [address]    Report Status 05/15/2020 FINAL  Final  Blood culture (routine x 2)     Status: None   Collection Time: 05/10/20  2:14 PM   Specimen: BLOOD  Result Value Ref Range Status   Specimen Description BLOOD RIGHT HAND  Final   Special Requests   Final    BOTTLES DRAWN AEROBIC AND ANAEROBIC Blood Culture adequate volume   Culture   Final    NO GROWTH 5 DAYS Performed at [facility], [address] [place], [place] [address]    Report Status 05/15/2020 FINAL  Final  Cath Tip Culture     Status: Abnormal   Collection Time: 05/10/20  2:19 PM   Specimen: Catheter Tip; Other  Result Value Ref Range Status   Specimen Description   Final    CATH TIP Performed at [facility], [address], [place], [place] [address]    Special Requests   Final    NONE Performed at [facility], [address], [place], [place] [address]    Culture 50,000 COLONIES/mL PANTOEA SPECIES (A)  Final   Report Status 05/13/2020 FINAL  Final   Organism ID, Bacteria PANTOEA SPECIES (A)  Final      Susceptibility   Pantoea species - MIC*    CEFAZOLIN <=4 SENSITIVE Sensitive     CEFEPIME <=0.12 SENSITIVE Sensitive     CEFTRIAXONE <=0.25 SENSITIVE Sensitive     CIPROFLOXACIN <=0.25 SENSITIVE Sensitive     GENTAMICIN <=1 SENSITIVE Sensitive     IMIPENEM <=0.25 SENSITIVE Sensitive     NITROFURANTOIN 64 INTERMEDIATE Intermediate     TRIMETH/SULFA <=20 SENSITIVE Sensitive     PIP/TAZO 64 INTERMEDIATE Intermediate     * 50,000 COLONIES/mL PANTOEA SPECIES  Blood Culture (routine x 2)     Status: None (Preliminary result)   Collection Time: 05/14/20  7:50 AM   Specimen: BLOOD  Result Value  Ref Range Status    Specimen Description BLOOD RIGHT ANTECUBITAL  Final   Special Requests   Final    Blood Culture results may not be optimal due to an inadequate volume of blood received in culture bottles BOTTLES DRAWN AEROBIC ONLY   Culture   Final    NO GROWTH 3 DAYS Performed at [facility], [address] [place], [place] [address]    Report Status PENDING  Incomplete  Resp Panel by RT-PCR (Flu A&B, Covid) Nasopharyngeal Swab     Status: None   Collection Time: 05/14/20  7:51 AM   Specimen: Nasopharyngeal Swab; Nasopharyngeal(NP) swabs in vial transport medium  Result Value Ref Range Status   SARS Coronavirus 2 by RT PCR NEGATIVE NEGATIVE Final    Comment: (NOTE) SARS-CoV-2 target nucleic acids are NOT DETECTED.  The SARS-CoV-2 RNA is generally detectable in upper respiratory specimens during the acute phase of infection. The lowest concentration of SARS-CoV-2 viral copies this assay can detect is 138 copies/mL. A negative result does not preclude SARS-Cov-2 infection and should not be used as the sole basis for treatment or other patient management decisions. A negative result may occur with  improper specimen collection/handling, submission of specimen other than nasopharyngeal swab, presence of viral mutation(s) within the areas targeted by this assay, and inadequate number of viral copies(<138 copies/mL). A negative result must be combined with clinical observations, patient history, and epidemiological information. The expected result is Negative.  Fact Sheet for Patients:  BloggerCourse.com  Fact Sheet for Healthcare Providers:  SeriousBroker.it  This test is no t yet approved or cleared by the [place] FDA and  has been authorized for detection and/or diagnosis of SARS-CoV-2 by FDA under an Emergency Use Authorization (EUA). This EUA will remain  in effect (meaning this test can be used) for the duration of the COVID-19  declaration under Section 564(b)(1) of the Act, 21 U.S.C.section 360bbb-3(b)(1), unless the authorization is terminated  or revoked sooner.       Influenza A by PCR NEGATIVE NEGATIVE Final   Influenza B by PCR NEGATIVE NEGATIVE Final    Comment: (NOTE) The Xpert Xpress SARS-CoV-2/FLU/RSV plus assay is intended as an aid in the diagnosis of influenza from Nasopharyngeal swab specimens and should not be used as a sole basis for treatment. Nasal washings and aspirates are unacceptable for Xpert Xpress SARS-CoV-2/FLU/RSV testing.  Fact Sheet for Patients: BloggerCourse.com  Fact Sheet for Healthcare Providers: SeriousBroker.it  This test is not yet approved or cleared by the [place] FDA and has been authorized for detection and/or diagnosis of SARS-CoV-2 by FDA under an Emergency Use Authorization (EUA). This EUA will remain in effect (meaning this test can be used) for the duration of the COVID-19 declaration under Section 564(b)(1) of the Act, 21 U.S.C. section 360bbb-3(b)(1), unless the authorization is terminated or revoked.  Performed at [facility], [address] [place], [place] [address]   Blood Culture (routine x 2)     Status: None (Preliminary result)   Collection Time: 05/14/20  7:55 AM   Specimen: BLOOD  Result Value Ref Range Status   Specimen Description BLOOD BLOOD RIGHT WRIST  Final   Special Requests   Final    Blood Culture adequate volume BOTTLES DRAWN AEROBIC AND ANAEROBIC   Culture   Final    NO GROWTH 3 DAYS Performed at [facility], [address], [place], [place] [address]    Report Status PENDING  Incomplete  MRSA PCR Screening  Status: None   Collection Time: 05/14/20  3:08 PM   Specimen: Nasopharyngeal  Result Value Ref Range Status   MRSA by PCR NEGATIVE NEGATIVE Final    Comment:        The GeneXpert MRSA Assay (FDA approved for NASAL specimens only), is one component of  a comprehensive MRSA colonization surveillance program. It is not intended to diagnose MRSA infection nor to guide or monitor treatment for MRSA infections. Performed at [facility], [address], [place], [place] [address]          Radiology Studies: [place] EKG SITE RITE  Result Date: 05/16/2020 If [facility] image not attached, placement could not be confirmed due to current cardiac rhythm.       Scheduled Meds: . Chlorhexidine Gluconate Cloth  6 each Topical Daily  . heparin  5,000 Units Subcutaneous Q8H  . insulin aspart  0-15 Units Subcutaneous Q6H  . octreotide  100 mcg Subcutaneous TID  . pantoprazole (PROTONIX) IV  40 mg Intravenous Q24H  . sodium chloride flush  10-40 mL Intracatheter Q12H   Continuous Infusions: . sodium chloride    . ampicillin-sulbactam (UNASYN) IV Stopped (05/17/20 1141)  . lactated ringers Stopped (05/17/20 1026)  . lactated ringers    . phenylephrine (NEO-SYNEPHRINE) Adult infusion Stopped (05/17/20 1111)  . potassium PHOSPHATE IVPB (in mmol) 43 mL/hr at 05/17/20 1150  . TPN ADULT (ION) 60 mL/hr at 05/17/20 1150  . TPN ADULT (ION)       LOS: 3 days    Time spent: 35 minutes    Natividad Halls Hoover Brunette, DO [facility]  If 7PM-7AM, please contact night-coverage [email or web address] 05/17/2020, 12:15 PM

## 2020-05-18 LAB — PHOSPHORUS: Phosphorus: 4.3 mg/dL (ref 2.5–4.6)

## 2020-05-18 LAB — COMPREHENSIVE METABOLIC PANEL
ALT: 28 U/L (ref 0–44)
AST: 32 U/L (ref 15–41)
Albumin: 2 g/dL — ABNORMAL LOW (ref 3.5–5.0)
Alkaline Phosphatase: 138 U/L — ABNORMAL HIGH (ref 38–126)
Anion gap: 8 (ref 5–15)
BUN: 17 mg/dL (ref 6–20)
CO2: 27 mmol/L (ref 22–32)
Calcium: 8 mg/dL — ABNORMAL LOW (ref 8.9–10.3)
Chloride: 103 mmol/L (ref 98–111)
Creatinine, Ser: 0.57 mg/dL (ref 0.44–1.00)
GFR, Estimated: >60 mL/min (ref >60)
Glucose, Bld: 137 mg/dL — ABNORMAL HIGH (ref 70–99)
Potassium: 3.7 mmol/L (ref 3.5–5.1)
Sodium: 138 mmol/L (ref 135–145)
Total Bilirubin: 0.6 mg/dL (ref 0.3–1.2)
Total Protein: 6 g/dL — ABNORMAL LOW (ref 6.5–8.1)

## 2020-05-18 LAB — GLUCOSE, CAPILLARY
Glucose-Capillary: 129 mg/dL — ABNORMAL HIGH (ref 70–99)
Glucose-Capillary: 125 mg/dL — ABNORMAL HIGH (ref 70–99)
Glucose-Capillary: 139 mg/dL — ABNORMAL HIGH (ref 70–99)
Glucose-Capillary: 100 mg/dL — ABNORMAL HIGH (ref 70–99)

## 2020-05-18 LAB — MAGNESIUM: Magnesium: 1.6 mg/dL — ABNORMAL LOW (ref 1.7–2.4)

## 2020-05-18 MED ORDER — TRAVASOL 10 % IV SOLN
INTRAVENOUS | Status: AC
  Filled 2020-05-18: qty 960

## 2020-05-18 MED ORDER — MAGNESIUM SULFATE 2 GM/50ML IV SOLN: 2.0000 g | Freq: Once | INTRAVENOUS | Status: DC

## 2020-05-18 MED ORDER — MAGNESIUM SULFATE 2 GM/50ML IV SOLN
2.0000 g | Freq: Once | INTRAVENOUS | Status: AC
  Administered 2020-05-18: 2 g via INTRAVENOUS
  Filled 2020-05-18: qty 50

## 2020-05-18 NOTE — Progress Notes (Signed)
PROGRESS NOTE    [name]  WNI:[id number] DOB: [birth year] DOA: 05/14/2020 PCP: Avis Epley, PA-C   Brief Narrative:   Christina Fiebig Sheltonis a 60 y.o.femalewith medical history significant ofhistory of perforated appendicitis with partial small bowel resection and ileostomy, gastroesophageal flux disease, hypertension and recent infected PICC line and fevers approximately 4 to 5 days prior to admission.She was noted to have a positive cath tip demonstrating pantoea speciesand was treated empirically with Bactrim. She has been started on Unasyn empirically on admission. 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 She continues to have a high amount of output with soft blood pressure readings requiring the use of repeat fluid boluses with ongoing aggressive maintenance fluid.  She has been weaned off of her pressors and requires TPN.  Assessment & Plan:   Principal Problem:   Acute renal failure (ARF) (HCC) Active Problems:   GERD (gastroesophageal reflux disease)   Enterocutaneous fistula   Lactic acidosis   Dehydration   Prerenal AKI-improving -Improving with aggressive hydration, continue to monitor -Strict I's and O'swith good UO noted -Avoid nephrotoxic agents  Hypokalemia-improved -Continue to monitor and replete aggressively  Hypomagnesemia -Replete and follow  Hypotension-improved -Continues to require careful monitoring -Secondary to high output fistula -Continue aggressive IV fluid hydration with repeat boluses as needed -Weaned off of phenylephrine 3/24  GERD -Continue PPI IV daily  High output enterocutaneous fistula -Per general surgery -Need to continue aggressive IV fluid with fluid boluses -Sandostatin and Imodium initiatedon 3/23 with diminished output -Continues to require TPN and is n.p.o. -Per general surgery, reconstructive surgery cannot be performed until 1 year after  the initial surgery -May require LTAC placement if output drainage continues  Lactic acidosis-resolved -Likely secondary to dehydration  SIRS criteria -Recent catheter tip withpantoea spp -Blood cultures x24hours with no growth -Continue to monitor cultures -Unasynfor total 7 days; currently day 4  Moderate protein calorie malnutrition -Continue with TPN moving forward afterPICC can be replaced  DVT prophylaxis:Heparin Code Status:Full Family Communication:Patient will call Disposition Plan: Status is: Inpatient  Remains inpatient appropriate because:Persistent severe electrolyte disturbances, IV treatments appropriate due to intensity of illness or inability to take PO and Inpatient level of care appropriate due to severity of illness   Dispo: The patient is from:Home Anticipated d/c is FG:HWEX with home health versus LTAC Patient currently is not medically stable to d/c. Difficult to place patient No   Consultants:  General Surgery  Procedures:  See below  Antimicrobials:  Anti-infectives (From admission, onward)   Start     Dose/Rate Route Frequency Ordered Stop   05/16/20 1215  Ampicillin-Sulbactam (UNASYN) 3 g in sodium chloride 0.9 % 100 mL IVPB        3 g 200 mL/hr over 30 Minutes Intravenous Every 6 hours 05/16/20 1204     05/14/20 2100  Ampicillin-Sulbactam (UNASYN) 3 g in sodium chloride 0.9 % 100 mL IVPB  Status:  Discontinued        3 g 200 mL/hr over 30 Minutes Intravenous Every 24 hours 05/14/20 1804 05/16/20 1204   05/14/20 1800  piperacillin-tazobactam (ZOSYN) IVPB 3.375 g  Status:  Discontinued        3.375 g 12.5 mL/hr over 240 Minutes Intravenous Every 12 hours 05/14/20 1305 05/14/20 1804   05/14/20 0915  piperacillin-tazobactam (ZOSYN) IVPB 3.375 g        3.375 g 100 mL/hr over 30 Minutes Intravenous  Once 05/14/20 0901 05/14/20 1055  05/14/20 0900  aztreonam (AZACTAM) 2 g in sodium  chloride 0.9 % 100 mL IVPB  Status:  Discontinued        2 g 200 mL/hr over 30 Minutes Intravenous  Once 05/14/20 0854 05/14/20 0859   05/14/20 0900  vancomycin (VANCOCIN) IVPB 1000 mg/200 mL premix  Status:  Discontinued        1,000 mg 200 mL/hr over 60 Minutes Intravenous  Once 05/14/20 0854 05/14/20 0859   05/14/20 0900  vancomycin (VANCOREADY) IVPB 1250 mg/250 mL        1,250 mg 166.7 mL/hr over 90 Minutes Intravenous  Once 05/14/20 0859 05/14/20 1454       Subjective: Patient seen and evaluated today with no new acute complaints or concerns. No acute concerns or events noted overnight.  Her output continues to diminish.  She does get somewhat hypotensive overnight, but does rest quite well.  Objective: Vitals:   05/18/20 0500 05/18/20 0600 05/18/20 0700 05/18/20 0729  BP: (!) 88/39 (!) 84/34 (!) 88/42   Pulse: 62 71 66 65  Resp: Temp:    98.5 F (36.9 C)  TempSrc:    Oral  SpO2: 97% 96% 96% 100%  Weight:      Height:        Intake/Output Summary (Last 24 hours) at 05/18/2020 0906 Last data filed at 05/18/2020 0730 Gross per 24 hour  Intake 3826.08 ml  Output 4050 ml  Net -223.92 ml   Filed Weights   05/16/20 0524 05/16/20 1047 05/17/20 0606  Weight: 72.9 kg 73.4 kg 73.2 kg    Examination:  General exam: Appears calm and comfortable  Respiratory system: Clear to auscultation. Respiratory effort normal. Cardiovascular system: S1 & S2 heard, RRR.  Gastrointestinal system: Abdomen is soft, ostomy bags with ongoing output noted Central nervous system: Alert and awake Extremities: No edema Skin: No significant lesions noted Psychiatry: Flat affect.    Data Reviewed: I have personally reviewed following labs and imaging studies  CBC: Recent Labs  Lab 05/14/20 0750 05/15/20 0531 05/16/20 0455 05/17/20 0408  WBC 14.4* 5.5 5.7 4.5  NEUTROABS 10.8*  --   --  2.5  HGB 13.2 10.6* 9.2* 8.4*  HCT 39.7 33.0* 30.0* 27.7*  MCV 82.5 86.6 90.6 92.3   PLT 558* 329 356 267   Basic Metabolic Panel: Recent Labs  Lab 05/14/20 0750 05/14/20 0810 05/15/20 0531 05/15/20 1334 05/16/20 0455 05/17/20 0408 05/18/20 0402  NA 123*  --  130*  --  138 139 138  K 3.6  --  2.5* 3.4* 3.6 3.9 3.7  CL 80*  --  90*  --  107 105 103  CO2 21*  --  24  --  GLUCOSE 176*  --  92  --  137* 130* 137*  BUN 65*  --  55*  --  CREATININE 5.30*  --  3.63*  --  0.88 0.68 0.57  CALCIUM 8.7*  --  7.8*  --  7.6* 7.9* 8.0*  MG  --  2.1  --   --  1.7 1.5* 1.6*  PHOS  --   --   --   --   --  2.1* 4.3   GFR: Estimated Creatinine Clearance: 69.3 mL/min (by C-G formula based on SCr of 0.57 mg/dL). Liver Function Tests: Recent Labs  Lab 05/14/20 0750 05/15/20 0531 05/16/20 0455 05/17/20 0408 05/18/20 0402  AST 84* 82* 55* 51* 32  ALT 59* 54* 41 36 28  ALKPHOS 303* 262* 189* 152* 138*  BILITOT 1.0 1.2 0.6 0.5 0.6  PROT 10.4* 7.5 6.2* 6.0* 6.0*  ALBUMIN 3.5 2.7* 2.2* 2.0* 2.0*   No results for input(s): LIPASE, AMYLASE in the last 168 hours. No results for input(s): AMMONIA in the last 168 hours. Coagulation Profile: Recent Labs  Lab 05/14/20 0750  INR 1.1   Cardiac Enzymes: Recent Labs  Lab 05/14/20 0750  CKTOTAL 39   BNP (last 3 results) No results for input(s): PROBNP in the last 8760 hours. HbA1C: No results for input(s): HGBA1C in the last 72 hours. CBG: Recent Labs  Lab 05/17/20 0603 05/17/20 1107 05/17/20 1727 05/18/20 0211 05/18/20 0611  GLUCAP 142* 143* 102* 129* 125*   Lipid Profile: Recent Labs    05/17/20 0408  TRIG 151*   Thyroid Function Tests: No results for input(s): TSH, T4TOTAL, FREET4, T3FREE, THYROIDAB in the last 72 hours. Anemia Panel: No results for input(s): VITAMINB12, FOLATE, FERRITIN, TIBC, IRON, RETICCTPCT in the last 72 hours. Sepsis Labs: Recent Labs  Lab 05/14/20 0949 05/14/20 1150 05/14/20 1402 05/15/20 0531  LATICACIDVEN 4.1* 2.6* 1.4 1.3    Recent Results (from the  past 240 hour(s))  Blood culture (routine x 2)     Status: None   Collection Time: 05/10/20  2:13 PM   Specimen: BLOOD  Result Value Ref Range Status   Specimen Description BLOOD LEFT WRIST  Final   Special Requests   Final    BOTTLES DRAWN AEROBIC AND ANAEROBIC Blood Culture adequate volume   Culture   Final    NO GROWTH 5 DAYS Performed at [facility], [address], [place], [place] [address]    Report Status 05/15/2020 FINAL  Final  Blood culture (routine x 2)     Status: None   Collection Time: 05/10/20  2:14 PM   Specimen: BLOOD  Result Value Ref Range Status   Specimen Description BLOOD RIGHT HAND  Final   Special Requests   Final    BOTTLES DRAWN AEROBIC AND ANAEROBIC Blood Culture adequate volume   Culture   Final    NO GROWTH 5 DAYS Performed at [facility], [address], [place], [place] [address]    Report Status 05/15/2020 FINAL  Final  Cath Tip Culture     Status: Abnormal   Collection Time: 05/10/20  2:19 PM   Specimen: Catheter Tip; Other  Result Value Ref Range Status   Specimen Description   Final    CATH TIP Performed at [facility], [address], [place], [place] [address]    Special Requests   Final    NONE Performed at [facility], [address] [place], [place] [address]    Culture 50,000 COLONIES/mL PANTOEA SPECIES (A)  Final   Report Status 05/13/2020 FINAL  Final   Organism ID, Bacteria PANTOEA SPECIES (A)  Final      Susceptibility   Pantoea species - MIC*    CEFAZOLIN <=4 SENSITIVE Sensitive     CEFEPIME <=0.12 SENSITIVE Sensitive     CEFTRIAXONE <=0.25 SENSITIVE Sensitive     CIPROFLOXACIN <=0.25 SENSITIVE Sensitive     GENTAMICIN <=1 SENSITIVE Sensitive     IMIPENEM <=0.25 SENSITIVE Sensitive     NITROFURANTOIN 64 INTERMEDIATE Intermediate     TRIMETH/SULFA <=20 SENSITIVE Sensitive     PIP/TAZO 64 INTERMEDIATE Intermediate     * 50,000 COLONIES/mL PANTOEA SPECIES  Urine culture     Status:  Abnormal   Collection Time:  05/14/20  7:50 AM   Specimen: In/Out Cath Urine  Result Value Ref Range Status   Specimen Description   Final    IN/OUT CATH URINE Performed at [facility], [address] [place], [place] [address]    Special Requests   Final    NONE Performed at [facility], [address] [place], [place] [address]    Culture 50,000 COLONIES/mL YEAST (A)  Final   Report Status 05/17/2020 FINAL  Final  Blood Culture (routine x 2)     Status: None (Preliminary result)   Collection Time: 05/14/20  7:50 AM   Specimen: BLOOD  Result Value Ref Range Status   Specimen Description BLOOD RIGHT ANTECUBITAL  Final   Special Requests   Final    Blood Culture results may not be optimal due to an inadequate volume of blood received in culture bottles BOTTLES DRAWN AEROBIC ONLY   Culture   Final    NO GROWTH 4 DAYS Performed at [facility], [address] [place], [place] [address]    Report Status PENDING  Incomplete  Resp Panel by RT-PCR (Flu A&B, Covid) Nasopharyngeal Swab     Status: None   Collection Time: 05/14/20  7:51 AM   Specimen: Nasopharyngeal Swab; Nasopharyngeal(NP) swabs in vial transport medium  Result Value Ref Range Status   SARS Coronavirus 2 by RT PCR NEGATIVE NEGATIVE Final    Comment: (NOTE) SARS-CoV-2 target nucleic acids are NOT DETECTED.  The SARS-CoV-2 RNA is generally detectable in upper respiratory specimens during the acute phase of infection. The lowest concentration of SARS-CoV-2 viral copies this assay can detect is 138 copies/mL. A negative result does not preclude SARS-Cov-2 infection and should not be used as the sole basis for treatment or other patient management decisions. A negative result may occur with  improper specimen collection/handling, submission of specimen other than nasopharyngeal swab, presence of viral mutation(s) within the areas targeted by this assay, and inadequate number of viral copies(<138 copies/mL). A negative result must be combined  with clinical observations, patient history, and epidemiological information. The expected result is Negative.  Fact Sheet for Patients:  BloggerCourse.comhttps://www.fda.gov/media/152166/download  Fact Sheet for Healthcare Providers:  SeriousBroker.ithttps://www.fda.gov/media/152162/download  This test is no t yet approved or cleared by the [place] FDA and  has been authorized for detection and/or diagnosis of SARS-CoV-2 by FDA under an Emergency Use Authorization (EUA). This EUA will remain  in effect (meaning this test can be used) for the duration of the COVID-19 declaration under Section 564(b)(1) of the Act, 21 U.S.C.section 360bbb-3(b)(1), unless the authorization is terminated  or revoked sooner.       Influenza A by PCR NEGATIVE NEGATIVE Final   Influenza B by PCR NEGATIVE NEGATIVE Final    Comment: (NOTE) The Xpert Xpress SARS-CoV-2/FLU/RSV plus assay is intended as an aid in the diagnosis of influenza from Nasopharyngeal swab specimens and should not be used as a sole basis for treatment. Nasal washings and aspirates are unacceptable for Xpert Xpress SARS-CoV-2/FLU/RSV testing.  Fact Sheet for Patients: BloggerCourse.comhttps://www.fda.gov/media/152166/download  Fact Sheet for Healthcare Providers: SeriousBroker.ithttps://www.fda.gov/media/152162/download  This test is not yet approved or cleared by the [place] FDA and has been authorized for detection and/or diagnosis of SARS-CoV-2 by FDA under an Emergency Use Authorization (EUA). This EUA will remain in effect (meaning this test can be used) for the duration of the COVID-19 declaration under Section 564(b)(1) of the Act, 21 U.S.C. section 360bbb-3(b)(1), unless the authorization is  terminated or revoked.  Performed at [facility], [address] [place], [place] [address]   Blood Culture (routine x 2)     Status: None (Preliminary result)   Collection Time: 05/14/20  7:55 AM   Specimen: BLOOD  Result Value Ref Range Status   Specimen Description BLOOD  BLOOD RIGHT WRIST  Final   Special Requests   Final    Blood Culture adequate volume BOTTLES DRAWN AEROBIC AND ANAEROBIC   Culture   Final    NO GROWTH 4 DAYS Performed at [facility], [address], [place], [place] [address]    Report Status PENDING  Incomplete  MRSA PCR Screening     Status: None   Collection Time: 05/14/20  3:08 PM   Specimen: Nasopharyngeal  Result Value Ref Range Status   MRSA by PCR NEGATIVE NEGATIVE Final    Comment:        The GeneXpert MRSA Assay (FDA approved for NASAL specimens only), is one component of a comprehensive MRSA colonization surveillance program. It is not intended to diagnose MRSA infection nor to guide or monitor treatment for MRSA infections. Performed at [facility], [address] [place], [place] [address]          Radiology Studies: No results found.      Scheduled Meds:  Chlorhexidine Gluconate Cloth  6 each Topical Daily   heparin  5,000 Units Subcutaneous Q8H   insulin aspart  0-15 Units Subcutaneous Q6H   octreotide  100 mcg Subcutaneous TID   pantoprazole (PROTONIX) IV  40 mg Intravenous Q24H   sodium chloride flush  10-40 mL Intracatheter Q12H   Continuous Infusions:  sodium chloride     ampicillin-sulbactam (UNASYN) IV Stopped (05/18/20 8309)   lactated ringers 80 mL/hr at 05/18/20 0730   magnesium sulfate bolus IVPB     phenylephrine (NEO-SYNEPHRINE) Adult infusion Stopped (05/17/20 1111)   TPN ADULT (ION) 80 mL/hr at 05/18/20 0730   TPN ADULT (ION)       LOS: 4 days    Time spent: 35 minutes    Christina Downs Hoover Brunette, DO [facility]  If 7PM-7AM, please contact night-coverage [email or web address] 05/18/2020, 9:06 AM

## 2020-05-18 NOTE — Progress Notes (Signed)
Pt arrived via bed to room 317 form ICU. Pt moved self to bed in room without difficulty or assistance. Tolerated well, denies any c/o pain at present. Oriented to room and safety procedures, husband and pt state understanding. Purewick in place, telemetry on, NSR per central tele. TPN and IVF infusing via left upper arm dual lumen PICC line without difficulty or s/s infiltration. Call bed within reach. VSS.

## 2020-05-18 NOTE — Progress Notes (Signed)
PHARMACY - TOTAL PARENTERAL NUTRITION CONSULT NOTE   Indication: enterocutaneous fistula  Patient Measurements: Height: 5' 1"  (154.9 cm) Weight: 73.2 kg (161 lb 6 oz) IBW/kg (Calculated) : 47.8 TPN AdjBW (KG): 52.2 Body mass index is 30.49 kg/m.  Assessment:  60 y.o.femalewith medical history significant ofhistory of perforated appendicitis with partial small bowel resection and ileostomy, gastroesophageal flux disease, hypertension and TPN for 5 months post perforated appendicitis.  She had a recent infected PICC line and fevers approximately 4 to 5 days prior to admission.She was noted to have a positive cath tip demonstrating pantoea species and PICC line removed 3/18. She has had difficulty keeping things down. 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   Glucose / Insulin: 102-143: 6 units insulin given/24 hours Electrolytes: Mag 1.6. others WNL Renal: stable Hepatic:   Alk phos 138 TG 151 Intake / Output; MIVF:  LR at 80 mls/hr  Central access: PICC line placed 05/16/20 TPN start date: 05/16/20  Nutritional Goals per RD :  Kcal:  1375-1500 Protein:  95-105 gr Fluid:  per MD goals   Current Nutrition:  NPO  Plan:   Continue TPN to 27m/hr at 1800 Electrolytes in TPN: Na 475m/L, K 6011mL, Ca 5mE90m, Mg 10mE65m and Phos 5mmol35m Cl:Ac 1:2 Add standard MVI and trace elements to TPN Initiate Moderate q6h SSI and adjust as needed Monitor TPN labs on Mon/Thurs  StevenMargot AblesmD Clinical Pharmacist 05/18/2020 8:24 AM

## 2020-05-18 NOTE — Progress Notes (Signed)
  Subjective: Patient had a restful night.  She is having some leakage around the appliance bag for the enterocutaneous fistula.  Objective: Vital signs in last 24 hours: Temp:  [97.9 F (36.6 C)-98.5 F (36.9 C)] 98.5 F (36.9 C) (03/26 0729) Pulse Rate:  [62-87] 65 (03/26 0729) Resp:  [13-22] 17 (03/26 0729) BP: (81-117)/(34-67) 88/42 (03/26 0700) SpO2:  [96 %-100 %] 100 % (03/26 0729) Last BM Date: 05/16/20  Intake/Output from previous day: 03/25 0701 - 03/26 0700 In: 4026.5 [I.V.:3172.1; IV Piggyback:854.5] Out: 4850 [Urine:4100; Drains:550; Stool:200] Intake/Output this shift: Total I/O In: 393.3 [I.V.:293.3; IV Piggyback:100] Out: -   General appearance: alert, cooperative and no distress GI: Soft.  Appliance bags placed over ileostomy and enterocutaneous fistula.  Lab Results:  Recent Labs    05/16/20 0455 05/17/20 0408  WBC 5.7 4.5  HGB 9.2* 8.4*  HCT 30.0* 27.7*  PLT 356 267   BMET Recent Labs    05/17/20 0408 05/18/20 0402  NA 139 138  K 3.9 3.7  CL 105 103  CO2 26 27  GLUCOSE 130* 137*  BUN 11 17  CREATININE 0.68 0.57  CALCIUM 7.9* 8.0*   PT/INR No results for input(s): LABPROT, INR in the last 72 hours.  Studies/Results: No results found.  Anti-infectives: Anti-infectives (From admission, onward)   Start     Dose/Rate Route Frequency Ordered Stop   05/16/20 1215  Ampicillin-Sulbactam (UNASYN) 3 g in sodium chloride 0.9 % 100 mL IVPB        3 g 200 mL/hr over 30 Minutes Intravenous Every 6 hours 05/16/20 1204     05/14/20 2100  Ampicillin-Sulbactam (UNASYN) 3 g in sodium chloride 0.9 % 100 mL IVPB  Status:  Discontinued        3 g 200 mL/hr over 30 Minutes Intravenous Every 24 hours 05/14/20 1804 05/16/20 1204   05/14/20 1800  piperacillin-tazobactam (ZOSYN) IVPB 3.375 g  Status:  Discontinued        3.375 g 12.5 mL/hr over 240 Minutes Intravenous Every 12 hours 05/14/20 1305 05/14/20 1804   05/14/20 0915  piperacillin-tazobactam  (ZOSYN) IVPB 3.375 g        3.375 g 100 mL/hr over 30 Minutes Intravenous  Once 05/14/20 0901 05/14/20 1055   05/14/20 0900  aztreonam (AZACTAM) 2 g in sodium chloride 0.9 % 100 mL IVPB  Status:  Discontinued        2 g 200 mL/hr over 30 Minutes Intravenous  Once 05/14/20 0854 05/14/20 0859   05/14/20 0900  vancomycin (VANCOCIN) IVPB 1000 mg/200 mL premix  Status:  Discontinued        1,000 mg 200 mL/hr over 60 Minutes Intravenous  Once 05/14/20 0854 05/14/20 0859   05/14/20 0900  vancomycin (VANCOREADY) IVPB 1250 mg/250 mL        1,250 mg 166.7 mL/hr over 90 Minutes Intravenous  Once 05/14/20 0859 05/14/20 1454      Assessment/Plan: Impression: Patient has slowly improved.  Still having significant enterocutaneous fistula output requiring fluid boluses.  Agree with possible need for LTAC prior to discharge home.  Patient is on Sandostatin.  Continue current therapy.  LOS: 4 days    Franky Macho 05/18/2020

## 2020-05-19 LAB — GLUCOSE, CAPILLARY
Glucose-Capillary: 103 mg/dL — ABNORMAL HIGH (ref 70–99)
Glucose-Capillary: 108 mg/dL — ABNORMAL HIGH (ref 70–99)
Glucose-Capillary: 108 mg/dL — ABNORMAL HIGH (ref 70–99)
Glucose-Capillary: 119 mg/dL — ABNORMAL HIGH (ref 70–99)
Glucose-Capillary: 97 mg/dL (ref 70–99)

## 2020-05-19 LAB — COMPREHENSIVE METABOLIC PANEL
ALT: 23 U/L (ref 0–44)
AST: 28 U/L (ref 15–41)
Albumin: 1.9 g/dL — ABNORMAL LOW (ref 3.5–5.0)
Alkaline Phosphatase: 134 U/L — ABNORMAL HIGH (ref 38–126)
Anion gap: 7 (ref 5–15)
BUN: 16 mg/dL (ref 6–20)
CO2: 28 mmol/L (ref 22–32)
Calcium: 8.1 mg/dL — ABNORMAL LOW (ref 8.9–10.3)
Chloride: 101 mmol/L (ref 98–111)
Creatinine, Ser: 0.48 mg/dL (ref 0.44–1.00)
GFR, Estimated: >60 mL/min (ref >60)
Glucose, Bld: 122 mg/dL — ABNORMAL HIGH (ref 70–99)
Potassium: 4.3 mmol/L (ref 3.5–5.1)
Sodium: 136 mmol/L (ref 135–145)
Total Bilirubin: 0.3 mg/dL (ref 0.3–1.2)
Total Protein: 6 g/dL — ABNORMAL LOW (ref 6.5–8.1)

## 2020-05-19 LAB — CULTURE, BLOOD (ROUTINE X 2)
Culture: NO GROWTH
Culture: NO GROWTH
Special Requests: ADEQUATE

## 2020-05-19 LAB — CBC
HCT: 29.1 % — ABNORMAL LOW (ref 36.0–46.0)
Hemoglobin: 9 g/dL — ABNORMAL LOW (ref 12.0–15.0)
MCH: 28.2 pg (ref 26.0–34.0)
MCHC: 30.9 g/dL (ref 30.0–36.0)
MCV: 91.2 fL (ref 80.0–100.0)
Platelets: 288 10*3/uL (ref 150–400)
RBC: 3.19 MIL/uL — ABNORMAL LOW (ref 3.87–5.11)
RDW: 13.5 % (ref 11.5–15.5)
WBC: 7.4 10*3/uL (ref 4.0–10.5)
nRBC: 0 % (ref 0.0–0.2)

## 2020-05-19 LAB — PHOSPHORUS: Phosphorus: 3.8 mg/dL (ref 2.5–4.6)

## 2020-05-19 LAB — MAGNESIUM: Magnesium: 1.7 mg/dL (ref 1.7–2.4)

## 2020-05-19 MED ORDER — TRAVASOL 10 % IV SOLN
INTRAVENOUS | Status: AC
  Filled 2020-05-19: qty 960

## 2020-05-19 MED ORDER — INSULIN ASPART 100 UNIT/ML ~~LOC~~ SOLN: 0.0000 [IU] | Freq: Three times a day (TID) | SUBCUTANEOUS | Status: DC

## 2020-05-19 NOTE — Progress Notes (Addendum)
PHARMACY - TOTAL PARENTERAL NUTRITION CONSULT NOTE   Indication: enterocutaneous fistula  Patient Measurements: Height: 5' 1"  (154.9 cm) Weight: 73.2 kg (161 lb 6 oz) IBW/kg (Calculated) : 47.8 TPN AdjBW (KG): 52.2 Body mass index is 30.49 kg/m.  Assessment:  60 y.o.femalewith medical history significant ofhistory of perforated appendicitis with partial small bowel resection and ileostomy, gastroesophageal flux disease, hypertension and TPN for 5 months post perforated appendicitis.  She had a recent infected PICC line and fevers approximately 4 to 5 days prior to admission.She was noted to have a positive cath tip demonstrating pantoea species and PICC line removed 3/18. She has had difficulty keeping things down. 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   Glucose / Insulin: 100-139: 2 units insulin given/24 hours Electrolytes: WNL Renal: stable Hepatic:   Alk phos 138 TG 151 Intake / Output; MIVF:  LR at 80 mls/hr  Central access: PICC line placed 05/16/20 TPN start date: 05/16/20  Nutritional Goals per RD :  Kcal:  1375-1500 Protein:  95-105 gr Fluid:  per MD goals   Current Nutrition:  NPO  Plan:  Continue TPN to 58m/hr at 1800 Electrolytes in TPN: Na 460m/L, K 6083mL, Ca 5mE47m, Mg 10mE65m and Phos 5mmol70m Cl:Ac 1:1 Add standard MVI and trace elements to TPN Change to moderate q8h SSI and adjust as needed Monitor TPN labs on Mon/Thurs  StevenMargot AblesmD Clinical Pharmacist 05/19/2020 8:05 AM

## 2020-05-19 NOTE — Progress Notes (Signed)
PROGRESS NOTE    MERSEDES Spencer  CBS:[id number] DOB: 21-Sep-1960 DOA: 05/14/2020 PCP: Avis Epley, PA-C   Brief Narrative:   Flynn Lininger Sheltonis a 60 y.o.femalewith medical history significant ofhistory of perforated appendicitis with partial small bowel resection and ileostomy, gastroesophageal flux disease, hypertension and recent infected PICC line and fevers approximately 4 to 5 days prior to admission.She was noted to have a positive cath tip demonstrating pantoea speciesand was treated empirically with Bactrim. She has been started on Unasyn empirically on admission. 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 She continues to have a high amount of output with soft blood pressure readings requiring the use of repeat fluid boluses with ongoing aggressive maintenance fluid.She has been weaned off of her pressors and requires TPN.  Assessment & Plan:   Principal Problem:   Acute renal failure (ARF) (HCC) Active Problems:   GERD (gastroesophageal reflux disease)   Enterocutaneous fistula   Lactic acidosis   Dehydration   Prerenal AKI-resolved -Improving with aggressive hydration, continue to monitor -Strict I's and O'swith good UO noted -Avoid nephrotoxic agents  Hypotension-resolved -Continues to require careful monitoring -Secondary to high output fistula, with output diminishing -Continue aggressive IV fluid hydration with repeat boluses as needed -Weaned off of phenylephrine 3/24  GERD -Continue PPIIVdaily  High output enterocutaneous fistula -Per general surgery -Need to continue aggressive IV fluid with fluid boluses -Sandostatin discontinued 3/27.  Continue Imodium -Continues to require TPN and is n.p.o. -Per general surgery, reconstructive surgery cannot be performed until 1 year after the initial surgery -May require LTAC placement if output drainage continues  Lactic  acidosis-resolved -Likely secondary to dehydration  SIRS criteria -Recent catheter tip withpantoea spp -Blood cultures with no growth in 5 days -Unasynfor total 7 days; currently day5  Moderate protein calorie malnutrition -Continue with TPN moving forward afterPICC can be replaced  DVT prophylaxis:Heparin Code Status:Full Family Communication:Patient will call Disposition Plan: Status is: Inpatient  Remains inpatient appropriate because:Persistent severe electrolyte disturbances, IV treatments appropriate due to intensity of illness or inability to take PO and Inpatient level of care appropriate due to severity of illness   Dispo: The patient is from:Home Anticipated d/c is [id number] home health versus LTAC Patient currently is not medically stable to d/c. Difficult to place patient No   Consultants:  General Surgery  Procedures:  See below  Antimicrobials:  Anti-infectives (From admission, onward)   Start     Dose/Rate Route Frequency Ordered Stop   05/16/20 1215  Ampicillin-Sulbactam (UNASYN) 3 g in sodium chloride 0.9 % 100 mL IVPB        3 g 200 mL/hr over 30 Minutes Intravenous Every 6 hours 05/16/20 1204     05/14/20 2100  Ampicillin-Sulbactam (UNASYN) 3 g in sodium chloride 0.9 % 100 mL IVPB  Status:  Discontinued        3 g 200 mL/hr over 30 Minutes Intravenous Every 24 hours 05/14/20 1804 05/16/20 1204   05/14/20 1800  piperacillin-tazobactam (ZOSYN) IVPB 3.375 g  Status:  Discontinued        3.375 g 12.5 mL/hr over 240 Minutes Intravenous Every 12 hours 05/14/20 1305 05/14/20 1804   05/14/20 0915  piperacillin-tazobactam (ZOSYN) IVPB 3.375 g        3.375 g 100 mL/hr over 30 Minutes Intravenous  Once 05/14/20 0901 05/14/20 1055   05/14/20 0900  aztreonam (AZACTAM) 2 g in sodium chloride 0.9 % 100 mL IVPB  Status:  Discontinued  2 g 200 mL/hr over 30 Minutes Intravenous  Once 05/14/20 0854  05/14/20 0859   05/14/20 0900  vancomycin (VANCOCIN) IVPB 1000 mg/200 mL premix  Status:  Discontinued        1,000 mg 200 mL/hr over 60 Minutes Intravenous  Once 05/14/20 0854 05/14/20 0859   05/14/20 0900  vancomycin (VANCOREADY) IVPB 1250 mg/250 mL        1,250 mg 166.7 mL/hr over 90 Minutes Intravenous  Once 05/14/20 0859 05/14/20 1454       Subjective: Patient seen and evaluated today with no new acute complaints or concerns. She continues to have ongoing ostomy output with some leaking around her bag overnight.  Patient has slept well overnight.  Objective: Vitals:   05/18/20 1400 05/18/20 1500 05/18/20 2052 05/19/20 0548  BP: 116/64 136/60 (!) 119/55 112/74  Pulse: 71 69 74 64  Resp: 17 18 20 20   Temp:  98.7 F (37.1 C) 98.8 F (37.1 C) 98.2 F (36.8 C)  TempSrc:  Oral Oral Oral  SpO2: 100%  99% 98%  Weight:      Height:        Intake/Output Summary (Last 24 hours) at 05/19/2020 1106 Last data filed at 05/19/2020 0900 Gross per 24 hour  Intake 3503.42 ml  Output 2700 ml  Net 803.42 ml   Filed Weights   05/16/20 0524 05/16/20 1047 05/17/20 0606  Weight: 72.9 kg 73.4 kg 73.2 kg    Examination:  General exam: Appears calm and comfortable  Respiratory system: Clear to auscultation. Respiratory effort normal. Cardiovascular system: S1 & S2 heard, RRR.  Gastrointestinal system: Abdomen with ostomy bags and ongoing drainage Central nervous system: Alert and awake Extremities: No edema Skin: No significant lesions noted Psychiatry: Flat affect.    Data Reviewed: I have personally reviewed following labs and imaging studies  CBC: Recent Labs  Lab 05/14/20 0750 05/15/20 0531 05/16/20 0455 05/17/20 0408 05/19/20 0510  WBC 14.4* 5.5 5.7 4.5 7.4  NEUTROABS 10.8*  --   --  2.5  --   HGB 13.2 10.6* 9.2* 8.4* 9.0*  HCT 39.7 33.0* 30.0* 27.7* 29.1*  MCV 82.5 86.6 90.6 92.3 91.2  PLT 558* 329 356 267 288   Basic Metabolic Panel: Recent Labs  Lab  05/14/20 0810 05/15/20 0531 05/15/20 1334 05/16/20 0455 05/17/20 0408 05/18/20 0402 05/19/20 0510  NA  --  130*  --  138 139 138 136  K  --  2.5* 3.4* 3.6 3.9 3.7 4.3  CL  --  90*  --  107 105 103 101  CO2  --  24  --  23 26 27 28   GLUCOSE  --  92  --  137* 130* 137* 122*  BUN  --  55*  --  16 11 17 16   CREATININE  --  3.63*  --  0.88 0.68 0.57 0.48  CALCIUM  --  7.8*  --  7.6* 7.9* 8.0* 8.1*  MG 2.1  --   --  1.7 1.5* 1.6* 1.7  PHOS  --   --   --   --  2.1* 4.3 3.8   GFR: Estimated Creatinine Clearance: 69.3 mL/min (by C-G formula based on SCr of 0.48 mg/dL). Liver Function Tests: Recent Labs  Lab 05/15/20 0531 05/16/20 0455 05/17/20 0408 05/18/20 0402 05/19/20 0510  AST 82* 55* 51* 32 28  ALT 54* 41 36 28 23  ALKPHOS 262* 189* 152* 138* 134*  BILITOT 1.2 0.6 0.5 0.6 0.3  PROT 7.5  6.2* 6.0* 6.0* 6.0*  ALBUMIN 2.7* 2.2* 2.0* 2.0* 1.9*   No results for input(s): LIPASE, AMYLASE in the last 168 hours. No results for input(s): AMMONIA in the last 168 hours. Coagulation Profile: Recent Labs  Lab 05/14/20 0750  INR 1.1   Cardiac Enzymes: Recent Labs  Lab 05/14/20 0750  CKTOTAL 39   BNP (last 3 results) No results for input(s): PROBNP in the last 8760 hours. HbA1C: No results for input(s): HGBA1C in the last 72 hours. CBG: Recent Labs  Lab 05/18/20 0611 05/18/20 1110 05/18/20 1742 05/19/20 0020 05/19/20 0604  GLUCAP 125* 139* 100* 119* 108*   Lipid Profile: Recent Labs    05/17/20 0408  TRIG 151*   Thyroid Function Tests: No results for input(s): TSH, T4TOTAL, FREET4, T3FREE, THYROIDAB in the last 72 hours. Anemia Panel: No results for input(s): VITAMINB12, FOLATE, FERRITIN, TIBC, IRON, RETICCTPCT in the last 72 hours. Sepsis Labs: Recent Labs  Lab 05/14/20 0949 05/14/20 1150 05/14/20 1402 05/15/20 0531  LATICACIDVEN 4.1* 2.6* 1.4 1.3    Recent Results (from the past 240 hour(s))  Blood culture (routine x 2)     Status: None   Collection  Time: 05/10/20  2:13 PM   Specimen: BLOOD  Result Value Ref Range Status   Specimen Description BLOOD LEFT WRIST  Final   Special Requests   Final    BOTTLES DRAWN AEROBIC AND ANAEROBIC Blood Culture adequate volume   Culture   Final    NO GROWTH 5 DAYS Performed at [facility], [address] [place], [place] [address]    Report Status 05/15/2020 FINAL  Final  Blood culture (routine x 2)     Status: None   Collection Time: 05/10/20  2:14 PM   Specimen: BLOOD  Result Value Ref Range Status   Specimen Description BLOOD RIGHT HAND  Final   Special Requests   Final    BOTTLES DRAWN AEROBIC AND ANAEROBIC Blood Culture adequate volume   Culture   Final    NO GROWTH 5 DAYS Performed at [facility], [address] [place], [place] [address]    Report Status 05/15/2020 FINAL  Final  Cath Tip Culture     Status: Abnormal   Collection Time: 05/10/20  2:19 PM   Specimen: Catheter Tip; Other  Result Value Ref Range Status   Specimen Description   Final    CATH TIP Performed at [facility], [address] [place], [place] [address]    Special Requests   Final    NONE Performed at [facility], [address], [place], [place] [address]    Culture 50,000 COLONIES/mL PANTOEA SPECIES (A)  Final   Report Status 05/13/2020 FINAL  Final   Organism ID, Bacteria PANTOEA SPECIES (A)  Final      Susceptibility   Pantoea species - MIC*    CEFAZOLIN <=4 SENSITIVE Sensitive     CEFEPIME <=0.12 SENSITIVE Sensitive     CEFTRIAXONE <=0.25 SENSITIVE Sensitive     CIPROFLOXACIN <=0.25 SENSITIVE Sensitive     GENTAMICIN <=1 SENSITIVE Sensitive     IMIPENEM <=0.25 SENSITIVE Sensitive     NITROFURANTOIN 64 INTERMEDIATE Intermediate     TRIMETH/SULFA <=20 SENSITIVE Sensitive     PIP/TAZO 64 INTERMEDIATE Intermediate     * 50,000 COLONIES/mL PANTOEA SPECIES  Urine culture     Status: Abnormal   Collection Time: 05/14/20  7:50 AM   Specimen: In/Out Cath Urine  Result Value Ref Range Status  Specimen Description   Final    IN/OUT CATH URINE Performed at [facility], [address] [place], [place] [address]    Special Requests   Final    NONE Performed at [facility], [address], [place], [place] [address]    Culture 50,000 COLONIES/mL YEAST (A)  Final   Report Status 05/17/2020 FINAL  Final  Blood Culture (routine x 2)     Status: None   Collection Time: 05/14/20  7:50 AM   Specimen: BLOOD  Result Value Ref Range Status   Specimen Description BLOOD RIGHT ANTECUBITAL  Final   Special Requests   Final    Blood Culture results may not be optimal due to an inadequate volume of blood received in culture bottles BOTTLES DRAWN AEROBIC ONLY   Culture   Final    NO GROWTH 5 DAYS Performed at [facility], [address] [place], [place] [address]    Report Status 05/19/2020 FINAL  Final  Resp Panel by RT-PCR (Flu A&B, Covid) Nasopharyngeal Swab     Status: None   Collection Time: 05/14/20  7:51 AM   Specimen: Nasopharyngeal Swab; Nasopharyngeal(NP) swabs in vial transport medium  Result Value Ref Range Status   SARS Coronavirus 2 by RT PCR NEGATIVE NEGATIVE Final    Comment: (NOTE) SARS-CoV-2 target nucleic acids are NOT DETECTED.  The SARS-CoV-2 RNA is generally detectable in upper respiratory specimens during the acute phase of infection. The lowest concentration of SARS-CoV-2 viral copies this assay can detect is 138 copies/mL. A negative result does not preclude SARS-Cov-2 infection and should not be used as the sole basis for treatment or other patient management decisions. A negative result may occur with  improper specimen collection/handling, submission of specimen other than nasopharyngeal swab, presence of viral mutation(s) within the areas targeted by this assay, and inadequate number of viral copies(<138 copies/mL). A negative result must be combined with clinical observations, patient history, and epidemiological information. The expected result is  Negative.  Fact Sheet for Patients:  BloggerCourse.com  Fact Sheet for Healthcare Providers:  SeriousBroker.it  This test is no t yet approved or cleared by the [place] FDA and  has been authorized for detection and/or diagnosis of SARS-CoV-2 by FDA under an Emergency Use Authorization (EUA). This EUA will remain  in effect (meaning this test can be used) for the duration of the COVID-19 declaration under Section 564(b)(1) of the Act, 21 U.S.C.section 360bbb-3(b)(1), unless the authorization is terminated  or revoked sooner.       Influenza A by PCR NEGATIVE NEGATIVE Final   Influenza B by PCR NEGATIVE NEGATIVE Final    Comment: (NOTE) The Xpert Xpress SARS-CoV-2/FLU/RSV plus assay is intended as an aid in the diagnosis of influenza from Nasopharyngeal swab specimens and should not be used as a sole basis for treatment. Nasal washings and aspirates are unacceptable for Xpert Xpress SARS-CoV-2/FLU/RSV testing.  Fact Sheet for Patients: BloggerCourse.com  Fact Sheet for Healthcare Providers: SeriousBroker.it  This test is not yet approved or cleared by the [place] FDA and has been authorized for detection and/or diagnosis of SARS-CoV-2 by FDA under an Emergency Use Authorization (EUA). This EUA will remain in effect (meaning this test can be used) for the duration of the COVID-19 declaration under Section 564(b)(1) of the Act, 21 U.S.C. section 360bbb-3(b)(1), unless the authorization is terminated or revoked.  Performed at [facility], [address] [place], [place] [address]   Blood Culture (routine x 2)  Status: None   Collection Time: 05/14/20  7:55 AM   Specimen: BLOOD  Result Value Ref Range Status   Specimen Description BLOOD BLOOD RIGHT WRIST  Final   Special Requests   Final    Blood Culture adequate volume BOTTLES DRAWN AEROBIC AND ANAEROBIC    Culture   Final    NO GROWTH 5 DAYS Performed at [facility], [address], [place], [place] [address]    Report Status 05/19/2020 FINAL  Final  MRSA PCR Screening     Status: None   Collection Time: 05/14/20  3:08 PM   Specimen: Nasopharyngeal  Result Value Ref Range Status   MRSA by PCR NEGATIVE NEGATIVE Final    Comment:        The GeneXpert MRSA Assay (FDA approved for NASAL specimens only), is one component of a comprehensive MRSA colonization surveillance program. It is not intended to diagnose MRSA infection nor to guide or monitor treatment for MRSA infections. Performed at [facility], [address], [place], [place] [address]          Radiology Studies: No results found.      Scheduled Meds: . Chlorhexidine Gluconate Cloth  6 each Topical Daily  . heparin  5,000 Units Subcutaneous Q8H  . insulin aspart  0-15 Units Subcutaneous Q8H  . pantoprazole (PROTONIX) IV  40 mg Intravenous Q24H  . sodium chloride flush  10-40 mL Intracatheter Q12H   Continuous Infusions: . sodium chloride    . ampicillin-sulbactam (UNASYN) IV 3 g (05/19/20 0525)  . lactated ringers 80 mL/hr at 05/19/20 0045  . phenylephrine (NEO-SYNEPHRINE) Adult infusion Stopped (05/17/20 1111)  . TPN ADULT (ION) 80 mL/hr at 05/18/20 1810  . TPN ADULT (ION)       LOS: 5 days    Time spent: 35 minutes    Pratik Hoover Brunette, DO [facility]  If 7PM-7AM, please contact night-coverage [email or web address] 05/19/2020, 11:06 AM

## 2020-05-19 NOTE — Progress Notes (Signed)
°  Subjective: No complaints of pain.  Objective: Vital signs in last 24 hours: Temp:  [98.2 F (36.8 C)-98.8 F (37.1 C)] 98.2 F (36.8 C) (03/27 0548) Pulse Rate:  [63-81] 64 (03/27 0548) Resp:  [17-20] 20 (03/27 0548) BP: (112-136)/(48-74) 112/74 (03/27 0548) SpO2:  [97 %-100 %] 98 % (03/27 0548) Last BM Date:  (ostomy bag has clear/green drainage)  Intake/Output from previous day: 03/26 0701 - 03/27 0700 In: 3896.8 [I.V.:3496.8; IV Piggyback:400] Out: 2700 [Urine:2400; Drains:300] Intake/Output this shift: No intake/output data recorded.  General appearance: alert, cooperative and no distress GI: soft, ostomies in place.  Lab Results:  Recent Labs    05/17/20 0408 05/19/20 0510  WBC 4.5 7.4  HGB 8.4* 9.0*  HCT 27.7* 29.1*  PLT 267 288   BMET Recent Labs    05/18/20 0402 05/19/20 0510  NA 138 136  K 3.7 4.3  CL 103 101  CO2 27 28  GLUCOSE 137* 122*  BUN 17 16  CREATININE 0.57 0.48  CALCIUM 8.0* 8.1*   PT/INR No results for input(s): LABPROT, INR in the last 72 hours.  Studies/Results: No results found.  Anti-infectives: Anti-infectives (From admission, onward)   Start     Dose/Rate Route Frequency Ordered Stop   05/16/20 1215  Ampicillin-Sulbactam (UNASYN) 3 g in sodium chloride 0.9 % 100 mL IVPB        3 g 200 mL/hr over 30 Minutes Intravenous Every 6 hours 05/16/20 1204     05/14/20 2100  Ampicillin-Sulbactam (UNASYN) 3 g in sodium chloride 0.9 % 100 mL IVPB  Status:  Discontinued        3 g 200 mL/hr over 30 Minutes Intravenous Every 24 hours 05/14/20 1804 05/16/20 1204   05/14/20 1800  piperacillin-tazobactam (ZOSYN) IVPB 3.375 g  Status:  Discontinued        3.375 g 12.5 mL/hr over 240 Minutes Intravenous Every 12 hours 05/14/20 1305 05/14/20 1804   05/14/20 0915  piperacillin-tazobactam (ZOSYN) IVPB 3.375 g        3.375 g 100 mL/hr over 30 Minutes Intravenous  Once 05/14/20 0901 05/14/20 1055   05/14/20 0900  aztreonam (AZACTAM) 2 g in  sodium chloride 0.9 % 100 mL IVPB  Status:  Discontinued        2 g 200 mL/hr over 30 Minutes Intravenous  Once 05/14/20 0854 05/14/20 0859   05/14/20 0900  vancomycin (VANCOCIN) IVPB 1000 mg/200 mL premix  Status:  Discontinued        1,000 mg 200 mL/hr over 60 Minutes Intravenous  Once 05/14/20 0854 05/14/20 0859   05/14/20 0900  vancomycin (VANCOREADY) IVPB 1250 mg/250 mL        1,250 mg 166.7 mL/hr over 90 Minutes Intravenous  Once 05/14/20 0859 05/14/20 1454      Assessment/Plan: Imp:  Ongoing enterocutaneous fistula.  Renal failure resolved. Plan:  Patient would like to go home with Home Health, TPN. Continue IV Zosyn.  Discussed with Dr. Sherryll Burger.  May stop Somatostatin.  LOS: 5 days    Franky Macho 05/19/2020

## 2020-05-20 LAB — COMPREHENSIVE METABOLIC PANEL
ALT: 23 U/L (ref 0–44)
AST: 30 U/L (ref 15–41)
Albumin: 2.2 g/dL — ABNORMAL LOW (ref 3.5–5.0)
Alkaline Phosphatase: 146 U/L — ABNORMAL HIGH (ref 38–126)
Anion gap: 8 (ref 5–15)
BUN: 18 mg/dL (ref 6–20)
CO2: 26 mmol/L (ref 22–32)
Calcium: 8.4 mg/dL — ABNORMAL LOW (ref 8.9–10.3)
Chloride: 100 mmol/L (ref 98–111)
Creatinine, Ser: 0.53 mg/dL (ref 0.44–1.00)
GFR, Estimated: >60 mL/min (ref >60)
Glucose, Bld: 87 mg/dL (ref 70–99)
Potassium: 4.2 mmol/L (ref 3.5–5.1)
Sodium: 134 mmol/L — ABNORMAL LOW (ref 135–145)
Total Bilirubin: 0.4 mg/dL (ref 0.3–1.2)
Total Protein: 6.5 g/dL (ref 6.5–8.1)

## 2020-05-20 LAB — MAGNESIUM: Magnesium: 1.8 mg/dL (ref 1.7–2.4)

## 2020-05-20 LAB — DIFFERENTIAL
Abs Immature Granulocytes: 0.04 10*3/uL (ref 0.00–0.07)
Basophils Absolute: 0.1 10*3/uL (ref 0.0–0.1)
Basophils Relative: 1 %
Eosinophils Absolute: 0.4 10*3/uL (ref 0.0–0.5)
Eosinophils Relative: 5 %
Immature Granulocytes: 1 %
Lymphocytes Relative: 27 %
Lymphs Abs: 2.3 10*3/uL (ref 0.7–4.0)
Monocytes Absolute: 0.5 10*3/uL (ref 0.1–1.0)
Monocytes Relative: 6 %
Neutro Abs: 5.2 10*3/uL (ref 1.7–7.7)
Neutrophils Relative %: 60 %

## 2020-05-20 LAB — GLUCOSE, CAPILLARY
Glucose-Capillary: 91 mg/dL (ref 70–99)
Glucose-Capillary: 100 mg/dL — ABNORMAL HIGH (ref 70–99)
Glucose-Capillary: 82 mg/dL (ref 70–99)
Glucose-Capillary: 92 mg/dL (ref 70–99)
Glucose-Capillary: 80 mg/dL (ref 70–99)

## 2020-05-20 LAB — CBC
HCT: 32.2 % — ABNORMAL LOW (ref 36.0–46.0)
Hemoglobin: 9.9 g/dL — ABNORMAL LOW (ref 12.0–15.0)
MCH: 28 pg (ref 26.0–34.0)
MCHC: 30.7 g/dL (ref 30.0–36.0)
MCV: 91.2 fL (ref 80.0–100.0)
Platelets: 316 10*3/uL (ref 150–400)
RBC: 3.53 MIL/uL — ABNORMAL LOW (ref 3.87–5.11)
RDW: 13.7 % (ref 11.5–15.5)
WBC: 8.4 10*3/uL (ref 4.0–10.5)
nRBC: 0 % (ref 0.0–0.2)

## 2020-05-20 LAB — TRIGLYCERIDES: Triglycerides: 227 mg/dL — ABNORMAL HIGH (ref <150)

## 2020-05-20 LAB — PREALBUMIN: Prealbumin: 13.9 mg/dL — ABNORMAL LOW (ref 18–38)

## 2020-05-20 LAB — PHOSPHORUS: Phosphorus: 4 mg/dL (ref 2.5–4.6)

## 2020-05-20 MED ORDER — TRAVASOL 10 % IV SOLN
INTRAVENOUS | Status: DC
  Filled 2020-05-20: qty 960

## 2020-05-20 NOTE — Progress Notes (Signed)
  Subjective: Patient states her reapplied ostomy bag for enterocutaneous fistula is not leaking now.  She reports a decreased amount of fistulous output.  Objective: Vital signs in last 24 hours: Temp:  [98 F (36.7 C)-98.7 F (37.1 C)] 98 F (36.7 C) (03/28 0459) Pulse Rate:  [66-82] 82 (03/28 0459) Resp:  [18] 18 (03/28 0459) BP: (94-108)/(57-68) 94/68 (03/28 0459) SpO2:  [97 %-100 %] 97 % (03/28 0459) Last BM Date: 05/20/20  Intake/Output from previous day: 03/27 0701 - 03/28 0700 In: 0  Out: 2800 [Urine:2800] Intake/Output this shift: Total I/O In: 0  Out: 300 [Urine:300]  General appearance: alert, cooperative and no distress GI: Soft, flat.  Ostomy bags in place over ileostomy and enterocutaneous fistula.  Lab Results:  Recent Labs    05/19/20 0510 05/20/20 0455  WBC 7.4 8.4  HGB 9.0* 9.9*  HCT 29.1* 32.2*  PLT 288 316   BMET Recent Labs    05/19/20 0510 05/20/20 0455  NA 136 134*  K 4.3 4.2  CL 101 100  CO2 28 26  GLUCOSE 122* 87  BUN 16 18  CREATININE 0.48 0.53  CALCIUM 8.1* 8.4*   PT/INR No results for input(s): LABPROT, INR in the last 72 hours.  Studies/Results: No results found.  Anti-infectives: Anti-infectives (From admission, onward)   Start     Dose/Rate Route Frequency Ordered Stop   05/16/20 1215  Ampicillin-Sulbactam (UNASYN) 3 g in sodium chloride 0.9 % 100 mL IVPB        3 g 200 mL/hr over 30 Minutes Intravenous Every 6 hours 05/16/20 1204     05/14/20 2100  Ampicillin-Sulbactam (UNASYN) 3 g in sodium chloride 0.9 % 100 mL IVPB  Status:  Discontinued        3 g 200 mL/hr over 30 Minutes Intravenous Every 24 hours 05/14/20 1804 05/16/20 1204   05/14/20 1800  piperacillin-tazobactam (ZOSYN) IVPB 3.375 g  Status:  Discontinued        3.375 g 12.5 mL/hr over 240 Minutes Intravenous Every 12 hours 05/14/20 1305 05/14/20 1804   05/14/20 0915  piperacillin-tazobactam (ZOSYN) IVPB 3.375 g        3.375 g 100 mL/hr over 30 Minutes  Intravenous  Once 05/14/20 0901 05/14/20 1055   05/14/20 0900  aztreonam (AZACTAM) 2 g in sodium chloride 0.9 % 100 mL IVPB  Status:  Discontinued        2 g 200 mL/hr over 30 Minutes Intravenous  Once 05/14/20 0854 05/14/20 0859   05/14/20 0900  vancomycin (VANCOCIN) IVPB 1000 mg/200 mL premix  Status:  Discontinued        1,000 mg 200 mL/hr over 60 Minutes Intravenous  Once 05/14/20 0854 05/14/20 0859   05/14/20 0900  vancomycin (VANCOREADY) IVPB 1250 mg/250 mL        1,250 mg 166.7 mL/hr over 90 Minutes Intravenous  Once 05/14/20 0859 05/14/20 1454      Assessment/Plan: Impression: Enterocutaneous fistula, seemingly decreasing in output. Plan: Continue current therapy.  Continue p.o. Imodium.  Sandostatin has been stopped.  Finishing antibiotic course.  LOS: 6 days    [name] 05/20/2020

## 2020-05-20 NOTE — Progress Notes (Signed)
PROGRESS NOTE    Melton KrebsSusan J Radziewicz  ZOX:[id number] DOB: [birth year] DOA: 05/14/2020 PCP: Avis EpleyJackson, Samantha J, PA-C   Brief Narrative:   Hettie HolsteinSusan J Sheltonis a 60 y.o.femalewith medical history significant ofhistory of perforated appendicitis with partial small bowel resection and ileostomy, gastroesophageal flux disease, hypertension and recent infected PICC line and fevers approximately 4 to 5 days prior to admission.She was noted to have a positive cath tip demonstrating pantoea speciesand was treated empirically with Bactrim. She has been started on Unasyn empirically on admission. 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 She continues to have a high amount of output with soft blood pressure readings requiring the use of repeat fluid boluses with ongoing aggressive maintenance fluid.She has been weaned off of her pressors and requires TPN.  Assessment & Plan:   Principal Problem:   Acute renal failure (ARF) (HCC) Active Problems:   GERD (gastroesophageal reflux disease)   Enterocutaneous fistula   Lactic acidosis   Dehydration   Prerenal AKI-resolved -Improving with aggressive hydration, continue to monitor -Strict I's and O'swith good UO noted -Avoid nephrotoxic agents  Hypotension-resolved -Continues to require careful monitoring -Secondary to high output fistula, with output diminishing -Continue aggressive IV fluid hydration with repeat boluses as needed -Weaned off of phenylephrine 3/24  GERD -Continue PPIIVdaily  High output enterocutaneous fistula -Per general surgery -Need to continue aggressive IV fluid with fluid boluses -Sandostatin discontinued 3/27.  Continue Imodium -Continues to require TPN and will remain n.p.o. -Per general surgery, reconstructive surgery cannot be performed until 1 year after the initial surgery -Drainage has significantly decreased, plan to go home with home health  and TPN on discharge, hopefully by a.m.  Lactic acidosis-resolved -Likely secondary to dehydration  SIRS criteria -Recent catheter tip withpantoea spp -Blood cultures with no growth in 5 days -Unasynfor total 7 days; currently day6  Moderate protein calorie malnutrition -Continue with TPN moving forward afterPICC can be replaced  DVT prophylaxis:Heparin Code Status:Full Family Communication:Patient will call Disposition Plan: Status is: Inpatient  Remains inpatient appropriate because:Persistent severe electrolyte disturbances, IV treatments appropriate due to intensity of illness or inability to take PO and Inpatient level of care appropriate due to severity of illness   Dispo: The patient is from:Home Anticipated d/c is WJ:XBJYNWGNto:Homewith home health Patient currently is not medically stable to d/c. Difficult to place patient No   Consultants:  General Surgery  Procedures:  See below  Antimicrobials:  Anti-infectives (From admission, onward)   Start     Dose/Rate Route Frequency Ordered Stop   05/16/20 1215  Ampicillin-Sulbactam (UNASYN) 3 g in sodium chloride 0.9 % 100 mL IVPB        3 g 200 mL/hr over 30 Minutes Intravenous Every 6 hours 05/16/20 1204     05/14/20 2100  Ampicillin-Sulbactam (UNASYN) 3 g in sodium chloride 0.9 % 100 mL IVPB  Status:  Discontinued        3 g 200 mL/hr over 30 Minutes Intravenous Every 24 hours 05/14/20 1804 05/16/20 1204   05/14/20 1800  piperacillin-tazobactam (ZOSYN) IVPB 3.375 g  Status:  Discontinued        3.375 g 12.5 mL/hr over 240 Minutes Intravenous Every 12 hours 05/14/20 1305 05/14/20 1804   05/14/20 0915  piperacillin-tazobactam (ZOSYN) IVPB 3.375 g        3.375 g 100 mL/hr over 30 Minutes Intravenous  Once 05/14/20 0901 05/14/20 1055   05/14/20 0900  aztreonam (AZACTAM) 2 g in sodium chloride 0.9 %  100 mL IVPB  Status:  Discontinued        2 g 200 mL/hr over 30  Minutes Intravenous  Once 05/14/20 0854 05/14/20 0859   05/14/20 0900  vancomycin (VANCOCIN) IVPB 1000 mg/200 mL premix  Status:  Discontinued        1,000 mg 200 mL/hr over 60 Minutes Intravenous  Once 05/14/20 0854 05/14/20 0859   05/14/20 0900  vancomycin (VANCOREADY) IVPB 1250 mg/250 mL        1,250 mg 166.7 mL/hr over 90 Minutes Intravenous  Once 05/14/20 0859 05/14/20 1454       Subjective: Patient seen and evaluated today with no new acute complaints or concerns. No acute concerns or events noted overnight.  Her output from the ostomy appears to be diminishing.  She continues to remain NPO.  Objective: Vitals:   05/19/20 0548 05/19/20 1241 05/19/20 2040 05/20/20 0459  BP: 112/74 (!) 106/57 108/65 94/68  Pulse: 64 66 70 82  Resp: 20 18 18 18   Temp: 98.2 F (36.8 C) 98.7 F (37.1 C) 98.2 F (36.8 C) 98 F (36.7 C)  TempSrc: Oral Oral Oral   SpO2: 98% 100% 97% 97%  Weight:      Height:        Intake/Output Summary (Last 24 hours) at 05/20/2020 0840 Last data filed at 05/20/2020 0600 Gross per 24 hour  Intake 0 ml  Output 2800 ml  Net -2800 ml   Filed Weights   05/16/20 0524 05/16/20 1047 05/17/20 0606  Weight: 72.9 kg 73.4 kg 73.2 kg    Examination:  General exam: Appears calm and comfortable  Respiratory system: Clear to auscultation. Respiratory effort normal. Cardiovascular system: S1 & S2 heard, RRR.  Gastrointestinal system: Ostomy bags as prior with diminishing output Central nervous system: Alert and awake Extremities: No edema Skin: No significant lesions noted Psychiatry: Flat affect.    Data Reviewed: I have personally reviewed following labs and imaging studies  CBC: Recent Labs  Lab 05/14/20 0750 05/15/20 0531 05/16/20 0455 05/17/20 0408 05/19/20 0510 05/20/20 0455  WBC 14.4* 5.5 5.7 4.5 7.4 8.4  NEUTROABS 10.8*  --   --  2.5  --  5.2  HGB 13.2 10.6* 9.2* 8.4* 9.0* 9.9*  HCT 39.7 33.0* 30.0* 27.7* 29.1* 32.2*  MCV 82.5 86.6 90.6  92.3 91.2 91.2  PLT 558* 329 356 267 288 316   Basic Metabolic Panel: Recent Labs  Lab 05/16/20 0455 05/17/20 0408 05/18/20 0402 05/19/20 0510 05/20/20 0455  NA 138 139 138 136 134*  K 3.6 3.9 3.7 4.3 4.2  CL 107 105 103 101 100  CO2 23 26 27 28 26   GLUCOSE 137* 130* 137* 122* 87  BUN 16 11 17 16 18   CREATININE 0.88 0.68 0.57 0.48 0.53  CALCIUM 7.6* 7.9* 8.0* 8.1* 8.4*  MG 1.7 1.5* 1.6* 1.7 1.8  PHOS  --  2.1* 4.3 3.8 4.0   GFR: Estimated Creatinine Clearance: 69.3 mL/min (by C-G formula based on SCr of 0.53 mg/dL). Liver Function Tests: Recent Labs  Lab 05/16/20 0455 05/17/20 0408 05/18/20 0402 05/19/20 0510 05/20/20 0455  AST 55* 51* 32 28 30  ALT 41 36 28 23 23   ALKPHOS 189* 152* 138* 134* 146*  BILITOT 0.6 0.5 0.6 0.3 0.4  PROT 6.2* 6.0* 6.0* 6.0* 6.5  ALBUMIN 2.2* 2.0* 2.0* 1.9* 2.2*   No results for input(s): LIPASE, AMYLASE in the last 168 hours. No results for input(s): AMMONIA in the last 168 hours. Coagulation Profile:  Recent Labs  Lab 05/14/20 0750  INR 1.1   Cardiac Enzymes: Recent Labs  Lab 05/14/20 0750  CKTOTAL 39   BNP (last 3 results) No results for input(s): PROBNP in the last 8760 hours. HbA1C: No results for input(s): HGBA1C in the last 72 hours. CBG: Recent Labs  Lab 05/19/20 1614 05/19/20 2038 05/19/20 2332 05/20/20 0501 05/20/20 0726  GLUCAP 97 108* 103* 91 100*   Lipid Profile: Recent Labs    05/20/20 0455  TRIG 227*   Thyroid Function Tests: No results for input(s): TSH, T4TOTAL, FREET4, T3FREE, THYROIDAB in the last 72 hours. Anemia Panel: No results for input(s): VITAMINB12, FOLATE, FERRITIN, TIBC, IRON, RETICCTPCT in the last 72 hours. Sepsis Labs: Recent Labs  Lab 05/14/20 0949 05/14/20 1150 05/14/20 1402 05/15/20 0531  LATICACIDVEN 4.1* 2.6* 1.4 1.3    Recent Results (from the past 240 hour(s))  Blood culture (routine x 2)     Status: None   Collection Time: 05/10/20  2:13 PM   Specimen: BLOOD   Result Value Ref Range Status   Specimen Description BLOOD LEFT WRIST  Final   Special Requests   Final    BOTTLES DRAWN AEROBIC AND ANAEROBIC Blood Culture adequate volume   Culture   Final    NO GROWTH 5 DAYS Performed at [facility], [address], [place], [place] [address]    Report Status 05/15/2020 FINAL  Final  Blood culture (routine x 2)     Status: None   Collection Time: 05/10/20  2:14 PM   Specimen: BLOOD  Result Value Ref Range Status   Specimen Description BLOOD RIGHT HAND  Final   Special Requests   Final    BOTTLES DRAWN AEROBIC AND ANAEROBIC Blood Culture adequate volume   Culture   Final    NO GROWTH 5 DAYS Performed at [facility], [address], [place], [place] [address]    Report Status 05/15/2020 FINAL  Final  Cath Tip Culture     Status: Abnormal   Collection Time: 05/10/20  2:19 PM   Specimen: Catheter Tip; Other  Result Value Ref Range Status   Specimen Description   Final    CATH TIP Performed at [facility], [address] [place], [place] [address]    Special Requests   Final    NONE Performed at [facility], [address], [place], [place] [address]    Culture 50,000 COLONIES/mL PANTOEA SPECIES (A)  Final   Report Status 05/13/2020 FINAL  Final   Organism ID, Bacteria PANTOEA SPECIES (A)  Final      Susceptibility   Pantoea species - MIC*    CEFAZOLIN <=4 SENSITIVE Sensitive     CEFEPIME <=0.12 SENSITIVE Sensitive     CEFTRIAXONE <=0.25 SENSITIVE Sensitive     CIPROFLOXACIN <=0.25 SENSITIVE Sensitive     GENTAMICIN <=1 SENSITIVE Sensitive     IMIPENEM <=0.25 SENSITIVE Sensitive     NITROFURANTOIN 64 INTERMEDIATE Intermediate     TRIMETH/SULFA <=20 SENSITIVE Sensitive     PIP/TAZO 64 INTERMEDIATE Intermediate     * 50,000 COLONIES/mL PANTOEA SPECIES  Urine culture     Status: Abnormal   Collection Time: 05/14/20  7:50 AM   Specimen: In/Out Cath Urine  Result Value Ref Range Status   Specimen Description   Final    IN/OUT  CATH URINE Performed at [facility], [address], [place], [place] [address]    Special Requests   Final    NONE Performed  at [facility], [address], [place], [place] [address]    Culture 50,000 COLONIES/mL YEAST (A)  Final   Report Status 05/17/2020 FINAL  Final  Blood Culture (routine x 2)     Status: None   Collection Time: 05/14/20  7:50 AM   Specimen: BLOOD  Result Value Ref Range Status   Specimen Description BLOOD RIGHT ANTECUBITAL  Final   Special Requests   Final    Blood Culture results may not be optimal due to an inadequate volume of blood received in culture bottles BOTTLES DRAWN AEROBIC ONLY   Culture   Final    NO GROWTH 5 DAYS Performed at [facility], [address] [place], [place] [address]    Report Status 05/19/2020 FINAL  Final  Resp Panel by RT-PCR (Flu A&B, Covid) Nasopharyngeal Swab     Status: None   Collection Time: 05/14/20  7:51 AM   Specimen: Nasopharyngeal Swab; Nasopharyngeal(NP) swabs in vial transport medium  Result Value Ref Range Status   SARS Coronavirus 2 by RT PCR NEGATIVE NEGATIVE Final    Comment: (NOTE) SARS-CoV-2 target nucleic acids are NOT DETECTED.  The SARS-CoV-2 RNA is generally detectable in upper respiratory specimens during the acute phase of infection. The lowest concentration of SARS-CoV-2 viral copies this assay can detect is 138 copies/mL. A negative result does not preclude SARS-Cov-2 infection and should not be used as the sole basis for treatment or other patient management decisions. A negative result may occur with  improper specimen collection/handling, submission of specimen other than nasopharyngeal swab, presence of viral mutation(s) within the areas targeted by this assay, and inadequate number of viral copies(<138 copies/mL). A negative result must be combined with clinical observations, patient history, and epidemiological information. The expected result is Negative.  Fact Sheet for Patients:   BloggerCourse.com  Fact Sheet for Healthcare Providers:  SeriousBroker.it  This test is no t yet approved or cleared by the [place] FDA and  has been authorized for detection and/or diagnosis of SARS-CoV-2 by FDA under an Emergency Use Authorization (EUA). This EUA will remain  in effect (meaning this test can be used) for the duration of the COVID-19 declaration under Section 564(b)(1) of the Act, 21 U.S.C.section 360bbb-3(b)(1), unless the authorization is terminated  or revoked sooner.       Influenza A by PCR NEGATIVE NEGATIVE Final   Influenza B by PCR NEGATIVE NEGATIVE Final    Comment: (NOTE) The Xpert Xpress SARS-CoV-2/FLU/RSV plus assay is intended as an aid in the diagnosis of influenza from Nasopharyngeal swab specimens and should not be used as a sole basis for treatment. Nasal washings and aspirates are unacceptable for Xpert Xpress SARS-CoV-2/FLU/RSV testing.  Fact Sheet for Patients: BloggerCourse.com  Fact Sheet for Healthcare Providers: SeriousBroker.it  This test is not yet approved or cleared by the [place] FDA and has been authorized for detection and/or diagnosis of SARS-CoV-2 by FDA under an Emergency Use Authorization (EUA). This EUA will remain in effect (meaning this test can be used) for the duration of the COVID-19 declaration under Section 564(b)(1) of the Act, 21 U.S.C. section 360bbb-3(b)(1), unless the authorization is terminated or revoked.  Performed at [facility], [address], [place], [place] [address]   Blood Culture (routine x 2)     Status: None   Collection Time: 05/14/20  7:55 AM   Specimen: BLOOD  Result Value Ref Range Status   Specimen Description BLOOD BLOOD RIGHT WRIST  Final   Special Requests  Final    Blood Culture adequate volume BOTTLES DRAWN AEROBIC AND ANAEROBIC   Culture   Final    NO GROWTH 5  DAYS Performed at [facility], [address], [place], [place] [address]    Report Status 05/19/2020 FINAL  Final  MRSA PCR Screening     Status: None   Collection Time: 05/14/20  3:08 PM   Specimen: Nasopharyngeal  Result Value Ref Range Status   MRSA by PCR NEGATIVE NEGATIVE Final    Comment:        The GeneXpert MRSA Assay (FDA approved for NASAL specimens only), is one component of a comprehensive MRSA colonization surveillance program. It is not intended to diagnose MRSA infection nor to guide or monitor treatment for MRSA infections. Performed at [facility], [address], [place], [place] [address]          Radiology Studies: No results found.      Scheduled Meds: . Chlorhexidine Gluconate Cloth  6 each Topical Daily  . heparin  5,000 Units Subcutaneous Q8H  . insulin aspart  0-15 Units Subcutaneous Q8H  . pantoprazole (PROTONIX) IV  40 mg Intravenous Q24H  . sodium chloride flush  10-40 mL Intracatheter Q12H   Continuous Infusions: . sodium chloride    . ampicillin-sulbactam (UNASYN) IV 3 g (05/20/20 0556)  . lactated ringers 80 mL/hr at 05/19/20 2129  . phenylephrine (NEO-SYNEPHRINE) Adult infusion Stopped (05/17/20 1111)  . TPN ADULT (ION) 80 mL/hr at 05/19/20 1818  . TPN ADULT (ION)       LOS: 6 days    Time spent: 35 minutes    Mylynn Dinh Hoover Brunette, DO [facility]  If 7PM-7AM, please contact night-coverage [email or web address] 05/20/2020, 8:40 AM

## 2020-05-20 NOTE — Progress Notes (Signed)
PHARMACY - TOTAL PARENTERAL NUTRITION CONSULT NOTE   Indication: enterocutaneous fistula  Patient Measurements: Height: 5' 1"  (154.9 cm) Weight: 73.2 kg (161 lb 6 oz) IBW/kg (Calculated) : 47.8 TPN AdjBW (KG): 52.2 Body mass index is 30.49 kg/m.  Assessment:  60 y.o.femalewith medical history significant ofhistory of perforated appendicitis with partial small bowel resection and ileostomy, gastroesophageal flux disease, hypertension and TPN for 5 months post perforated appendicitis.  She had a recent infected PICC line and fevers approximately 4 to 5 days prior to admission.She was noted to have a positive cath tip demonstrating pantoea species and PICC line removed 3/18. She has had difficulty keeping things down. 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   Glucose / Insulin: 91-108: 0 units insulin given/24 hours Electrolytes: Na 134. Others WNL Renal: stable Hepatic:   Alk phos 146 TG 151> 227  Intake / Output; MIVF:  LR at 80 mls/hr  Central access: PICC line placed 05/16/20 TPN start date: 05/16/20  Nutritional Goals per RD :  Kcal:  1375-1500 Protein:  95-105 gr Fluid:  per MD goals   Current Nutrition:  NPO  Plan:  Continue TPN to 75m/hr at 1800 Electrolytes in TPN: Na 747m/L, K 5041mL, Ca 5mE71m, Mg 10mE14m and Phos 5mmol72m Cl:Ac 1:1 Add standard MVI and trace elements to TPN Continue moderate q8h SSI- if tolerates additional 24 hours will discontinue CBG monitoring Monitor TPN labs on Mon/Thurs  StevenMargot AblesmD Clinical Pharmacist 05/20/2020 8:19 AM

## 2020-05-20 NOTE — Plan of Care (Signed)

## 2020-05-21 DIAGNOSIS — K631 Perforation of intestine (nontraumatic): Secondary | ICD-10-CM | POA: Diagnosis not present

## 2020-05-21 DIAGNOSIS — K632 Fistula of intestine: Secondary | ICD-10-CM | POA: Diagnosis not present

## 2020-05-21 DIAGNOSIS — K651 Peritoneal abscess: Secondary | ICD-10-CM | POA: Diagnosis not present

## 2020-05-21 LAB — GLUCOSE, CAPILLARY
Glucose-Capillary: 100 mg/dL — ABNORMAL HIGH (ref 70–99)
Glucose-Capillary: 101 mg/dL — ABNORMAL HIGH (ref 70–99)
Glucose-Capillary: 93 mg/dL (ref 70–99)

## 2020-05-21 MED ORDER — TRAVASOL 10 % IV SOLN
INTRAVENOUS | Status: DC
  Filled 2020-05-21: qty 960

## 2020-05-21 MED ORDER — LOPERAMIDE HCL 1 MG/7.5ML PO SUSP: 2.0000 mg | Freq: Four times a day (QID) | ORAL | 0 refills | Status: DC | PRN

## 2020-05-21 NOTE — Progress Notes (Signed)
  Subjective: Patient has no complaints.  Objective: Vital signs in last 24 hours: Temp:  [98.1 F (36.7 C)-98.7 F (37.1 C)] 98.1 F (36.7 C) (03/29 0424) Pulse Rate:  [80-84] 84 (03/29 0424) Resp:  [16-18] 16 (03/29 0424) BP: (95-101)/(60-67) 101/60 (03/29 0424) SpO2:  [98 %-99 %] 99 % (03/29 0424) Last BM Date: 05/20/20  Intake/Output from previous day: 03/28 0701 - 03/29 0700 In: 120 [I.V.:20; IV Piggyback:100] Out: 900 [Urine:900] Intake/Output this shift: No intake/output data recorded.  General appearance: alert, cooperative and no distress GI: Soft, ostomy bags in place.  Lab Results:  Recent Labs    05/19/20 0510 05/20/20 0455  WBC 7.4 8.4  HGB 9.0* 9.9*  HCT 29.1* 32.2*  PLT 288 316   BMET Recent Labs    05/19/20 0510 05/20/20 0455  NA 136 134*  K 4.3 4.2  CL 101 100  CO2 28 26  GLUCOSE 122* 87  BUN 16 18  CREATININE 0.48 0.53  CALCIUM 8.1* 8.4*   PT/INR No results for input(s): LABPROT, INR in the last 72 hours.  Studies/Results: No results found.  Anti-infectives: Anti-infectives (From admission, onward)   Start     Dose/Rate Route Frequency Ordered Stop   05/16/20 1215  Ampicillin-Sulbactam (UNASYN) 3 g in sodium chloride 0.9 % 100 mL IVPB        3 g 200 mL/hr over 30 Minutes Intravenous Every 6 hours 05/16/20 1204 05/21/20 1259   05/14/20 2100  Ampicillin-Sulbactam (UNASYN) 3 g in sodium chloride 0.9 % 100 mL IVPB  Status:  Discontinued        3 g 200 mL/hr over 30 Minutes Intravenous Every 24 hours 05/14/20 1804 05/16/20 1204   05/14/20 1800  piperacillin-tazobactam (ZOSYN) IVPB 3.375 g  Status:  Discontinued        3.375 g 12.5 mL/hr over 240 Minutes Intravenous Every 12 hours 05/14/20 1305 05/14/20 1804   05/14/20 0915  piperacillin-tazobactam (ZOSYN) IVPB 3.375 g        3.375 g 100 mL/hr over 30 Minutes Intravenous  Once 05/14/20 0901 05/14/20 1055   05/14/20 0900  aztreonam (AZACTAM) 2 g in sodium chloride 0.9 % 100 mL IVPB   Status:  Discontinued        2 g 200 mL/hr over 30 Minutes Intravenous  Once 05/14/20 0854 05/14/20 0859   05/14/20 0900  vancomycin (VANCOCIN) IVPB 1000 mg/200 mL premix  Status:  Discontinued        1,000 mg 200 mL/hr over 60 Minutes Intravenous  Once 05/14/20 0854 05/14/20 0859   05/14/20 0900  vancomycin (VANCOREADY) IVPB 1250 mg/250 mL        1,250 mg 166.7 mL/hr over 90 Minutes Intravenous  Once 05/14/20 0859 05/14/20 1454      Assessment/Plan: Impression: Enterocutaneous fistula output seems to be decreasing.  No labs done today.  Nothing further to add from the surgical standpoint.  Arranging discharge home.  She will need TPN.  May also need maintenance fluid in the evening with TPN.  LOS: 7 days    Franky Macho 05/21/2020

## 2020-05-21 NOTE — Progress Notes (Signed)
Nutrition Follow-up  DOCUMENTATION CODES:   Obesity unspecified  INTERVENTION:   -TPN management per pharmacy  NUTRITION DIAGNOSIS:   Inadequate oral intake related to inability to eat as evidenced by NPO status (high output enterocutaneous fistula.  Ileostomy- right side of abdomen. NPO / clear liquids x 5 months).  Ongoing  GOAL:   Patient will meet greater than or equal to 90% of their needs  Met with TPN  MONITOR:   Labs,Weight trends,Skin,I & O's  REASON FOR ASSESSMENT:   Consult New TPN/TNA  ASSESSMENT:   Patient is a 60 yo female with high output enterocutaneous fistula.  Ileostomy- right side of abdomen. History of perforated appendicitis with partial small bowel resection, GERD, HTN and recent infection associated with PICC line.  3/24- PICC placed, TPN initiated  Reviewed I/O's: -780 ml x 24 hours and -2.2 L since admission  UOP: 900 ml x 24 hours  Per general surgery notes, no plans for surgery until at least one year from initial surgery. Plan to continue bowel rest and TPN.   Pt with decreased fistula output over the past 2 days per general surgery notes. Plan to discharge home soon with TPN and possibly IV maintenance fluids.   Pt has been NPO since 05/16/20. She remains on TPN for nutritional support. She is receiving TPN at 80 ml/hr, which provides 1551 kcals and 96 grams protein, meeting 91% of estimated kcal needs and 100% of estimated protein needs.   Labs reviewed: CBGS: 80-101 (inpatient orders for glycemic control are 0-15 units insulin aspart eveyr 8 hours).   Diet Order:   Diet Order            Diet NPO time specified Except for: Ice Chips  Diet effective now                 EDUCATION NEEDS:   Not appropriate for education at this time  Skin:  Skin Assessment: Skin Integrity Issues: Skin Integrity Issues:: Other (Comment) Other: high output enterocutaneous fistula.  Last BM:  05/20/20 (via ileostomy)  Height:   Ht Readings  from Last 1 Encounters:  05/14/20 5' 1"  (1.549 m)    Weight:   Wt Readings from Last 1 Encounters:  05/17/20 73.2 kg   BMI:  Body mass index is 30.49 kg/m.  Estimated Nutritional Needs:   Kcal:  1700-1900  Protein:  95-110 grams  Fluid:  > 1.7 L    Loistine Chance, RD, LDN, [place] Registered Dietitian II Certified Diabetes Care and Education Specialist Please refer to [facility] for RD and/or RD on-call/weekend/after hours pager

## 2020-05-21 NOTE — Discharge Summary (Signed)
Physician Discharge Summary  [name]:[id number] DOB: 11-Jun-1960 DOA: 05/14/2020  PCP: Avis Epley, PA-C  Admit date: 05/14/2020  Discharge date: 05/21/2020  Admitted From:Home  Disposition:  Home  Recommendations for Outpatient Follow-up:  1. Follow up with PCP in 1-2 weeks 2. Follow-up with general surgery Dr. Lovell Sheehan as recommended 3. Continue home TPN with additional IV fluid of 125 cc/h as ordered over 12-hour period 4. Continue on Imodium as ordered  Home Health:Yes with home infusion services and RN  Equipment/Devices: None  Discharge Condition:Stable  CODE STATUS: Full  Diet recommendation: N.p.o. except sips with meds, continues on TPN  Brief/Interim Summary:  Christina Potteiger Sheltonis a 60 y.o.femalewith medical history significant ofhistory of perforated appendicitis with partial small bowel resection and ileostomy, gastroesophageal flux disease, hypertension and recent infected PICC line and fevers approximately 4 to 5 days prior to admission.She was noted to have a positive cath tip demonstrating pantoea speciesand was treated empirically with Bactrim. She has been started on Unasyn empirically on admission. 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  She has now been weaned off of pressors and has had stable blood pressure readings over several days, but continues to require ongoing IV fluid on top of her TPN to maintain blood pressure and fluid balance.  Her ostomy output has diminished greatly with use of Imodium and Sandostatin and she will continue on Imodium only at home.  She is also to remain on n.p.o. status and keep TPN and fluids going as ordered.  She has completed her Unasyn over 7 days while hospitalized and does not require any further antibiotics on discharge.  No other acute events noted throughout the course of this admission and she is overall stable for discharge.  She will require  reconstructive surgery for her fistula in the near future likely by 10/22.  Attempts were made to transfer the patient to tertiary care facility to have this procedure done, but she needs to wait 1 year to have the procedure done in order to see if the fistula will close spontaneously.  Discharge Diagnoses:  Principal Problem:   Acute renal failure (ARF) (HCC) Active Problems:   GERD (gastroesophageal reflux disease)   Enterocutaneous fistula   Lactic acidosis   Dehydration  Principal discharge diagnosis: Prerenal AKI and hypotension related to high output enterocutaneous fistula.  Recent catheter tip with pantoea infection.  Discharge Instructions  Discharge Instructions    Diet - low sodium heart healthy   Complete by: As directed    Discharge wound care:   Complete by: As directed    Daily dressing changes per home health services.   Increase activity slowly   Complete by: As directed      Allergies as of 05/21/2020      Reactions   5-alpha Reductase Inhibitors    Maxipime [cefepime]    Hives   Metronidazole    hives   Acetaminophen-codeine Rash   Tylenol with Codeine Tylenol #3,      Medication List    TAKE these medications   HYDROcodone-acetaminophen 5-325 MG tablet Commonly known as: NORCO/VICODIN Take 1 tablet by mouth every 4 (four) hours as needed for severe pain.   insulin lispro 100 UNIT/ML injection Commonly known as: HUMALOG 2-10 Units. Sliding scale 1 Units by Subcutaneous route three times daily as needed for Other (sliding scale insulin). 70-150=0 units 151-200=2 units 201-250=4 units 251-300=6 units 301-350=8 units 351-400=10 units   Klor-Con M20 20  MEQ tablet Generic drug: potassium chloride SA Take 20 mEq by mouth 2 (two) times daily.   loperamide HCl 1 MG/7.5ML suspension Commonly known as: IMODIUM Take 15 mLs (2 mg total) by mouth every 6 (six) hours as needed for diarrhea or loose stools.   pantoprazole 40 MG tablet Commonly known as:  PROTONIX Take 40 mg by mouth daily.   thiamine 100 MG tablet Take 100 mg by mouth daily.   Vitamin D (Cholecalciferol) 10 MCG (400 UNIT) Caps Take by mouth.   zinc sulfate 220 (50 Zn) MG capsule Take 220 mg by mouth daily.            Discharge Care Instructions  (From admission, onward)         Start     Ordered   05/21/20 0000  Discharge wound care:       Comments: Daily dressing changes per home health services.   05/21/20 1043          Follow-up Information    Franky Macho, MD Follow up.   Specialty: General Surgery Why: As needed Contact information: [address] [place] [address] [phone]        Avis Epley, PA-C. Schedule an appointment as soon as possible for a visit in 1 week(s).   Specialty: Family Medicine Contact information: [address] [place] [address] [phone]              Allergies  Allergen Reactions  . 5-Alpha Reductase Inhibitors   . Maxipime [Cefepime]     Hives    . Metronidazole     hives  . Acetaminophen-Codeine Rash    Tylenol with Codeine Tylenol #3,    Consultations:  General surgery-Dr. Lovell Sheehan   Procedures/Studies: CT ABDOMEN PELVIS WO CONTRAST  Result Date: 05/14/2020 CLINICAL DATA:  Right-sided abdominal pain for several months. Acute onset nausea and vomiting yesterday. Several months postop from right colectomy and distal small bowel resection. EXAM: CT ABDOMEN AND PELVIS WITHOUT CONTRAST TECHNIQUE: Multidetector CT imaging of the abdomen and pelvis was performed following the standard protocol without IV contrast. COMPARISON:  05/03/2020 FINDINGS: Lower chest: An ill-defined focal nodular opacity is seen in the anterior left lower lobe which measures 15 mm and is new since previous study. Hepatobiliary: Heterogeneous pattern of steatosis is again seen. No focal mass visualized on this unenhanced exam. Prior cholecystectomy. No evidence of biliary obstruction.  Pancreas: No mass or inflammatory process visualized on this unenhanced exam. Spleen:  Within normal limits in size. Adrenals/Urinary tract: No evidence of urolithiasis or hydronephrosis. Unremarkable unopacified urinary bladder. Stomach/Bowel: Postop changes from right colectomy and right lower quadrant ileostomy remains stable. No evidence of bowel obstruction, abscess, or acute inflammatory process. Possible umbilical fistula is again seen with several matted small bowel loops just deep to the anterior abdominal wall at the umbilicus. Fluid and gas are noted within the umbilical soft tissues. No evidence of hernia. Vascular/Lymphatic: No pathologically enlarged lymph nodes identified. No evidence of abdominal aortic aneurysm. Reproductive:  No mass or other significant abnormality. Other:  None. Musculoskeletal:  No suspicious bone lesions identified. IMPRESSION: No evidence of bowel obstruction or abscess. 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 No evidence of hernia. Stable hepatic steatosis. New ill-defined nodular opacity in anterior left lower lobe. Differential diagnosis includes inflammatory or infectious etiologies, with neoplasm considered less likely. Recommend follow-up by chest CT in 2-3 months. Electronically Signed   By: Dietrich Pates.D.  On: 05/14/2020 10:41   CT Abdomen Pelvis W Contrast  Result Date: 05/06/2020 CLINICAL DATA:  Intra-abdominal abscess. Follow-up perforation of colon. Drain in place. EXAM: CT ABDOMEN AND PELVIS WITH CONTRAST TECHNIQUE: Multidetector CT imaging of the abdomen and pelvis was performed using the standard protocol following bolus administration of intravenous contrast. CONTRAST:  100mL OMNIPAQUE IOHEXOL 300 MG/ML  SOLN COMPARISON:  CT of the abdomen pelvis 02/28/2020 and 02/06/2020 FINDINGS: Lower chest: The lung bases are clear without focal nodule, mass, or airspace disease. Heart size is normal. Heterogeneous  fatty infiltration of the liver again seen. No discrete lesions present. Cholecystectomy noted. Common bile duct is within normal limits. Hepatobiliary: No focal liver abnormality is seen. No gallstones, gallbladder wall thickening, or biliary dilatation. Pancreas: Unremarkable. No pancreatic ductal dilatation or surrounding inflammatory changes. Spleen: Normal in size without focal abnormality. Adrenals/Urinary Tract: Adrenal glands are normal bilaterally. Kidneys and ureters are within normal limits. No stone or mass lesion is present. No obstruction is present. Urinary bladder is within normal limits. Stomach/Bowel: Stomach and duodenum are within normal limits. Proximal small bowel is unremarkable. Loops of small bowel are noted against the anterior abdominal wall without obstruction. Ileostomy is intact. Colon is unremarkable. Vascular/Lymphatic: No significant vascular findings are present. No enlarged abdominal or pelvic lymph nodes. Reproductive: Uterus and bilateral adnexa are unremarkable. Other: Ventral wound no longer has fluid within the wound. There is no definite communication. Right lower quadrant drain is in place. No residual fluid collection or abscess is present. No free air or free fluid is present. Musculoskeletal: Vertebral body heights and alignment are normal. No focal lytic or blastic lesions are present. Degenerative changes are noted the SI joints. Hips are located and within normal limits. IMPRESSION: 1. Right lower quadrant drain is in place. No residual fluid collection or abscess is present. 2. Ventral wound no longer has fluid within the wound. There is no definite intraperitoneal communication. 3. Loops of small bowel are against the anterior abdominal wall without obstruction. Adhesions are likely. 4. Heterogeneous fatty infiltration of the liver. 5. Cholecystectomy. Electronically Signed   By: Marin Robertshristopher  Mattern M.D.   On: 05/06/2020 08:14   DG Chest Port 1 View  Result  Date: 05/14/2020 CLINICAL DATA:  Pain EXAM: PORTABLE CHEST 1 VIEW COMPARISON:  February 01, 2020 FINDINGS: Lungs are clear. Heart size and pulmonary vascularity are normal. No adenopathy. There is degenerative change in the thoracic spine and in each shoulder. IMPRESSION: Lungs clear.  Cardiac silhouette normal. Electronically Signed   By: Bretta BangWilliam  Woodruff III M.D.   On: 05/14/2020 08:16   US EKG SITE RITE  Result Date: 05/16/2020 If Site Rite image not attached, placement could not be confirmed due to current cardiac rhythm.     Discharge Exam: Vitals:   05/20/20 2055 05/21/20 0424  BP: 95/67 101/60  Pulse: 80 84  Resp: 18 16  Temp: 98.7 F (37.1 C) 98.1 F (36.7 C)  SpO2: 98% 99%   Vitals:   05/19/20 2040 05/20/20 0459 05/20/20 2055 05/21/20 0424  BP: 108/65 94/68 95/67  101/60  Pulse: 70 82 80 84  Resp: 18 18 18 16   Temp: 98.2 F (36.8 C) 98 F (36.7 C) 98.7 F (37.1 C) 98.1 F (36.7 C)  TempSrc: Oral  Oral   SpO2: 97% 97% 98% 99%  Weight:      Height:        General: Pt is alert, awake, not in acute distress Cardiovascular: RRR, S1/S2 +, no rubs, no  gallops Respiratory: CTA bilaterally, no wheezing, no rhonchi Abdominal: Ostomy bags present with minimal output Extremities: no edema, no cyanosis    The results of significant diagnostics from this hospitalization (including imaging, microbiology, ancillary and laboratory) are listed below for reference.     Microbiology: Recent Results (from the past 240 hour(s))  Urine culture     Status: Abnormal   Collection Time: 05/14/20  7:50 AM   Specimen: In/Out Cath Urine  Result Value Ref Range Status   Specimen Description   Final    IN/OUT CATH URINE Performed at [facility], [address], [place], [place] [address]    Special Requests   Final    NONE Performed at [facility], [address] [place], [place] [address]    Culture 50,000 COLONIES/mL YEAST (A)  Final   Report Status 05/17/2020 FINAL  Final   Blood Culture (routine x 2)     Status: None   Collection Time: 05/14/20  7:50 AM   Specimen: BLOOD  Result Value Ref Range Status   Specimen Description BLOOD RIGHT ANTECUBITAL  Final   Special Requests   Final    Blood Culture results may not be optimal due to an inadequate volume of blood received in culture bottles BOTTLES DRAWN AEROBIC ONLY   Culture   Final    NO GROWTH 5 DAYS Performed at [facility], [address] [place], [place] [address]    Report Status 05/19/2020 FINAL  Final  Resp Panel by RT-PCR (Flu A&B, Covid) Nasopharyngeal Swab     Status: None   Collection Time: 05/14/20  7:51 AM   Specimen: Nasopharyngeal Swab; Nasopharyngeal(NP) swabs in vial transport medium  Result Value Ref Range Status   SARS Coronavirus 2 by RT PCR NEGATIVE NEGATIVE Final    Comment: (NOTE) SARS-CoV-2 target nucleic acids are NOT DETECTED.  The SARS-CoV-2 RNA is generally detectable in upper respiratory specimens during the acute phase of infection. The lowest concentration of SARS-CoV-2 viral copies this assay can detect is 138 copies/mL. A negative result does not preclude SARS-Cov-2 infection and should not be used as the sole basis for treatment or other patient management decisions. A negative result may occur with  improper specimen collection/handling, submission of specimen other than nasopharyngeal swab, presence of viral mutation(s) within the areas targeted by this assay, and inadequate number of viral copies(<138 copies/mL). A negative result must be combined with clinical observations, patient history, and epidemiological information. The expected result is Negative.  Fact Sheet for Patients:  BloggerCourse.com  Fact Sheet for Healthcare Providers:  SeriousBroker.it  This test is no t yet approved or cleared by the [place] FDA and  has been authorized for detection and/or diagnosis of SARS-CoV-2 by FDA under an  Emergency Use Authorization (EUA). This EUA will remain  in effect (meaning this test can be used) for the duration of the COVID-19 declaration under Section 564(b)(1) of the Act, 21 U.S.C.section 360bbb-3(b)(1), unless the authorization is terminated  or revoked sooner.       Influenza A by PCR NEGATIVE NEGATIVE Final   Influenza B by PCR NEGATIVE NEGATIVE Final    Comment: (NOTE) The Xpert Xpress SARS-CoV-2/FLU/RSV plus assay is intended as an aid in the diagnosis of influenza from Nasopharyngeal swab specimens and should not be used as a sole basis for treatment. Nasal washings and aspirates are unacceptable for Xpert Xpress SARS-CoV-2/FLU/RSV testing.  Fact Sheet for Patients: BloggerCourse.com  Fact Sheet for Healthcare Providers: SeriousBroker.it  This test is  not yet approved or cleared by the [place] and has been authorized for detection and/or diagnosis of SARS-CoV-2 by FDA under an Emergency Use Authorization (EUA). This EUA will remain in effect (meaning this test can be used) for the duration of the COVID-19 declaration under Section 564(b)(1) of the Act, 21 U.S.C. section 360bbb-3(b)(1), unless the authorization is terminated or revoked.  Performed at [facility], [address], [place], [place] [address]   Blood Culture (routine x 2)     Status: None   Collection Time: 05/14/20  7:55 AM   Specimen: BLOOD  Result Value Ref Range Status   Specimen Description BLOOD BLOOD RIGHT WRIST  Final   Special Requests   Final    Blood Culture adequate volume BOTTLES DRAWN AEROBIC AND ANAEROBIC   Culture   Final    NO GROWTH 5 DAYS Performed at [facility], [address], [place], [place] [address]    Report Status 05/19/2020 FINAL  Final  MRSA PCR Screening     Status: None   Collection Time: 05/14/20  3:08 PM   Specimen: Nasopharyngeal  Result Value Ref Range Status   MRSA by PCR NEGATIVE NEGATIVE Final     Comment:        The GeneXpert MRSA Assay (FDA approved for NASAL specimens only), is one component of a comprehensive MRSA colonization surveillance program. It is not intended to diagnose MRSA infection nor to guide or monitor treatment for MRSA infections. Performed at [facility], [address] [place], [place] [address]      Labs: BNP (last 3 results) No results for input(s): BNP in the last 8760 hours. Basic Metabolic Panel: Recent Labs  Lab 05/16/20 0455 05/17/20 0408 05/18/20 0402 05/19/20 0510 05/20/20 0455  NA 138 139 138 136 134*  K 3.6 3.9 3.7 4.3 4.2  CL 107 105 103 101 100  CO2 23 26 27 28 26   GLUCOSE 137* 130* 137* 122* 87  BUN 16 11 17 16 18   CREATININE 0.88 0.68 0.57 0.48 0.53  CALCIUM 7.6* 7.9* 8.0* 8.1* 8.4*  MG 1.7 1.5* 1.6* 1.7 1.8  PHOS  --  2.1* 4.3 3.8 4.0   Liver Function Tests: Recent Labs  Lab 05/16/20 0455 05/17/20 0408 05/18/20 0402 05/19/20 0510 05/20/20 0455  AST 55* 51* 32 28 30  ALT 41 36 28 23 23   ALKPHOS 189* 152* 138* 134* 146*  BILITOT 0.6 0.5 0.6 0.3 0.4  PROT 6.2* 6.0* 6.0* 6.0* 6.5  ALBUMIN 2.2* 2.0* 2.0* 1.9* 2.2*   No results for input(s): LIPASE, AMYLASE in the last 168 hours. No results for input(s): AMMONIA in the last 168 hours. CBC: Recent Labs  Lab 05/15/20 0531 05/16/20 0455 05/17/20 0408 05/19/20 0510 05/20/20 0455  WBC 5.5 5.7 4.5 7.4 8.4  NEUTROABS  --   --  2.5  --  5.2  HGB 10.6* 9.2* 8.4* 9.0* 9.9*  HCT 33.0* 30.0* 27.7* 29.1* 32.2*  MCV 86.6 90.6 92.3 91.2 91.2  PLT 329 356 267 288 316   Cardiac Enzymes: No results for input(s): CKTOTAL, CKMB, CKMBINDEX, TROPONINI in the last 168 hours. BNP: Invalid input(s): POCBNP CBG: Recent Labs  Lab 05/20/20 1651 05/20/20 2119 05/21/20 0034 05/21/20 0347 05/21/20 0718  GLUCAP 92 80 93 101* 100*   D-Dimer No results for input(s): DDIMER in the last 72 hours. Hgb A1c No results for input(s): HGBA1C in the last 72 hours. Lipid  Profile Recent Labs    05/20/20 0455  TRIG 227*   Thyroid function studies No results for input(s): TSH, T4TOTAL, T3FREE, THYROIDAB in the last 72 hours.  Invalid input(s): FREET3 Anemia work up No results for input(s): VITAMINB12, FOLATE, FERRITIN, TIBC, IRON, RETICCTPCT in the last 72 hours. Urinalysis    Component Value Date/Time   COLORURINE YELLOW 05/14/2020 0750   APPEARANCEUR HAZY (A) 05/14/2020 0750   LABSPEC 1.018 05/14/2020 0750   PHURINE 5.0 05/14/2020 0750   GLUCOSEU NEGATIVE 05/14/2020 0750   HGBUR NEGATIVE 05/14/2020 0750   BILIRUBINUR NEGATIVE 05/14/2020 0750   KETONESUR NEGATIVE 05/14/2020 0750   PROTEINUR NEGATIVE 05/14/2020 0750   UROBILINOGEN 0.2 01/05/2012 0854   NITRITE NEGATIVE 05/14/2020 0750   LEUKOCYTESUR NEGATIVE 05/14/2020 0750   Sepsis Labs Invalid input(s): PROCALCITONIN,  WBC,  LACTICIDVEN Microbiology Recent Results (from the past 240 hour(s))  Urine culture     Status: Abnormal   Collection Time: 05/14/20  7:50 AM   Specimen: In/Out Cath Urine  Result Value Ref Range Status   Specimen Description   Final    IN/OUT CATH URINE Performed at [facility], [address] [place], [place] [address]    Special Requests   Final    NONE Performed at [facility], [address], [place], [place] [address]    Culture 50,000 COLONIES/mL YEAST (A)  Final   Report Status 05/17/2020 FINAL  Final  Blood Culture (routine x 2)     Status: None   Collection Time: 05/14/20  7:50 AM   Specimen: BLOOD  Result Value Ref Range Status   Specimen Description BLOOD RIGHT ANTECUBITAL  Final   Special Requests   Final    Blood Culture results may not be optimal due to an inadequate volume of blood received in culture bottles BOTTLES DRAWN AEROBIC ONLY   Culture   Final    NO GROWTH 5 DAYS Performed at [facility], [address] [place], [place] [address]    Report Status 05/19/2020 FINAL  Final  Resp Panel by RT-PCR (Flu A&B, Covid) Nasopharyngeal Swab      Status: None   Collection Time: 05/14/20  7:51 AM   Specimen: Nasopharyngeal Swab; Nasopharyngeal(NP) swabs in vial transport medium  Result Value Ref Range Status   SARS Coronavirus 2 by RT PCR NEGATIVE NEGATIVE Final    Comment: (NOTE) SARS-CoV-2 target nucleic acids are NOT DETECTED.  The SARS-CoV-2 RNA is generally detectable in upper respiratory specimens during the acute phase of infection. The lowest concentration of SARS-CoV-2 viral copies this assay can detect is 138 copies/mL. A negative result does not preclude SARS-Cov-2 infection and should not be used as the sole basis for treatment or other patient management decisions. A negative result may occur with  improper specimen collection/handling, submission of specimen other than nasopharyngeal swab, presence of viral mutation(s) within the areas targeted by this assay, and inadequate number of viral copies(<138 copies/mL). A negative result must be combined with clinical observations, patient history, and epidemiological information. The expected result is Negative.  Fact Sheet for Patients:  BloggerCourse.com  Fact Sheet for Healthcare Providers:  SeriousBroker.it  This test is no t yet approved or cleared by the [place] FDA and  has been authorized for detection and/or diagnosis of SARS-CoV-2 by FDA under an Emergency Use Authorization (EUA). This EUA will remain  in effect (meaning this test can be used) for the duration of the COVID-19 declaration under Section 564(b)(1) of the Act, 21 U.S.C.section 360bbb-3(b)(1), unless the authorization is terminated  or revoked sooner.  Influenza A by PCR NEGATIVE NEGATIVE Final   Influenza B by PCR NEGATIVE NEGATIVE Final    Comment: (NOTE) The Xpert Xpress SARS-CoV-2/FLU/RSV plus assay is intended as an aid in the diagnosis of influenza from Nasopharyngeal swab specimens and should not be used as a sole basis  for treatment. Nasal washings and aspirates are unacceptable for Xpert Xpress SARS-CoV-2/FLU/RSV testing.  Fact Sheet for Patients: BloggerCourse.com  Fact Sheet for Healthcare Providers: SeriousBroker.it  This test is not yet approved or cleared by the [place] FDA and has been authorized for detection and/or diagnosis of SARS-CoV-2 by FDA under an Emergency Use Authorization (EUA). This EUA will remain in effect (meaning this test can be used) for the duration of the COVID-19 declaration under Section 564(b)(1) of the Act, 21 U.S.C. section 360bbb-3(b)(1), unless the authorization is terminated or revoked.  Performed at [facility], [address], [place], [place] [address]   Blood Culture (routine x 2)     Status: None   Collection Time: 05/14/20  7:55 AM   Specimen: BLOOD  Result Value Ref Range Status   Specimen Description BLOOD BLOOD RIGHT WRIST  Final   Special Requests   Final    Blood Culture adequate volume BOTTLES DRAWN AEROBIC AND ANAEROBIC   Culture   Final    NO GROWTH 5 DAYS Performed at [facility], [address] [place], [place] [address]    Report Status 05/19/2020 FINAL  Final  MRSA PCR Screening     Status: None   Collection Time: 05/14/20  3:08 PM   Specimen: Nasopharyngeal  Result Value Ref Range Status   MRSA by PCR NEGATIVE NEGATIVE Final    Comment:        The GeneXpert MRSA Assay (FDA approved for NASAL specimens only), is one component of a comprehensive MRSA colonization surveillance program. It is not intended to diagnose MRSA infection nor to guide or monitor treatment for MRSA infections. Performed at [facility], [address] [place], [place] [address]      Time coordinating discharge: 35 minutes  SIGNED:   Erick Blinks, DO [facility] 05/21/2020, 11:01 AM  If 7PM-7AM, please contact night-coverage [email or web address]

## 2020-05-21 NOTE — Progress Notes (Signed)
PHARMACY - TOTAL PARENTERAL NUTRITION CONSULT NOTE   Indication: enterocutaneous fistula  Patient Measurements: Height: 5' 1"  (154.9 cm) Weight: 73.2 kg (161 lb 6 oz) IBW/kg (Calculated) : 47.8 TPN AdjBW (KG): 52.2 Body mass index is 30.49 kg/m.  Assessment:  60 y.o.femalewith medical history significant ofhistory of perforated appendicitis with partial small bowel resection and ileostomy, gastroesophageal flux disease, hypertension and TPN for 5 months post perforated appendicitis.  She had a recent infected PICC line and fevers approximately 4 to 5 days prior to admission.She was noted to have a positive cath tip demonstrating pantoea species and PICC line removed 3/18. She has had difficulty keeping things down. 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   Glucose / Insulin: 80-100: 0 units insulin given/24 hours Electrolytes: Na 134. Others WNL Renal: stable Hepatic:   Alk phos 146 TG 151> 227  Intake / Output; MIVF:  LR at 80 mls/hr  Central access: PICC line placed 05/16/20 TPN start date: 05/16/20  Nutritional Goals per RD :  Kcal:  1375-1500 Protein:  95-105 gr Fluid:  per MD goals   Current Nutrition:  NPO  Plan:  Continue TPN to 70m/hr at 1800 Electrolytes in TPN: Na 766m/L, K 5059mL, Ca 5mE59m, Mg 10mE24m and Phos 5mmol1m Cl:Ac 1:1 Add standard MVI and trace elements to TPN Patient tolerating TPN, discontinuing CBG q8h & SSI  Monitor TPN labs on Mon/Thurs  GarretThomasenia SalesmD, MBA, BCGP Clinical Pharmacist  05/21/2020 8:07 AM

## 2020-05-21 NOTE — TOC Transition Note (Signed)
Transition of Care [facility]) - CM/SW Discharge Note   Patient Details  Name: [name] MRN: [id number] Date of Birth: 1961-01-28  Transition of Care [facility]) CM/SW Contact:  Elliot Gault, LCSW Phone Number: 05/21/2020, 10:59 AM   Clinical Narrative:     Pt stable for dc today. Plan is dc home with continued TPN treatment from [facility]. MD has entered resumption orders with updated fluid needs. Contacted Pam from [facility] to update. They will resume pt's care.  There are no other TOC needs for dc.    Barriers to Discharge: Barriers Resolved   Patient Goals and CMS Choice Patient states their goals for this hospitalization and ongoing recovery are:: go home      Discharge Placement                       Discharge Plan and Services In-house Referral: Clinical Social Work   Post Acute Care Choice: Resumption of Svcs/PTA Provider                               Social Determinants of Health (SDOH) Interventions     Readmission Risk Interventions No flowsheet data found.

## 2020-05-21 NOTE — Progress Notes (Addendum)
Pt has discharge orders, discharge teaching given and no further questions at this time. pt will go home with double lumen PICC intact due to TPN needed. Pt has orders for home health once getting home, pt states her husband will be here around 4pm.

## 2020-05-28 DIAGNOSIS — K632 Fistula of intestine: Secondary | ICD-10-CM | POA: Diagnosis not present

## 2020-05-28 DIAGNOSIS — K631 Perforation of intestine (nontraumatic): Secondary | ICD-10-CM | POA: Diagnosis not present

## 2020-05-28 DIAGNOSIS — K651 Peritoneal abscess: Secondary | ICD-10-CM | POA: Diagnosis not present

## 2020-05-30 DIAGNOSIS — Z6828 Body mass index (BMI) 28.0-28.9, adult: Secondary | ICD-10-CM | POA: Diagnosis not present

## 2020-05-30 DIAGNOSIS — K632 Fistula of intestine: Secondary | ICD-10-CM | POA: Diagnosis not present

## 2020-05-30 DIAGNOSIS — T80218S Other infection due to central venous catheter, sequela: Secondary | ICD-10-CM | POA: Diagnosis not present

## 2020-05-30 DIAGNOSIS — Z23 Encounter for immunization: Secondary | ICD-10-CM | POA: Diagnosis not present

## 2020-05-30 DIAGNOSIS — K651 Peritoneal abscess: Secondary | ICD-10-CM | POA: Diagnosis not present

## 2020-05-30 DIAGNOSIS — Z1331 Encounter for screening for depression: Secondary | ICD-10-CM | POA: Diagnosis not present

## 2020-05-30 DIAGNOSIS — Z933 Colostomy status: Secondary | ICD-10-CM | POA: Diagnosis not present

## 2020-05-30 DIAGNOSIS — Z932 Ileostomy status: Secondary | ICD-10-CM | POA: Diagnosis not present

## 2020-05-30 DIAGNOSIS — K631 Perforation of intestine (nontraumatic): Secondary | ICD-10-CM | POA: Diagnosis not present

## 2020-06-03 DIAGNOSIS — Z1389 Encounter for screening for other disorder: Secondary | ICD-10-CM | POA: Diagnosis not present

## 2020-06-03 DIAGNOSIS — I10 Essential (primary) hypertension: Secondary | ICD-10-CM | POA: Diagnosis not present

## 2020-06-03 DIAGNOSIS — K651 Peritoneal abscess: Secondary | ICD-10-CM | POA: Diagnosis not present

## 2020-06-03 DIAGNOSIS — K631 Perforation of intestine (nontraumatic): Secondary | ICD-10-CM | POA: Diagnosis not present

## 2020-06-03 DIAGNOSIS — K632 Fistula of intestine: Secondary | ICD-10-CM | POA: Diagnosis not present

## 2020-06-03 DIAGNOSIS — E663 Overweight: Secondary | ICD-10-CM | POA: Diagnosis not present

## 2020-06-03 DIAGNOSIS — Z6828 Body mass index (BMI) 28.0-28.9, adult: Secondary | ICD-10-CM | POA: Diagnosis not present

## 2020-06-06 ENCOUNTER — Encounter (HOSPITAL_COMMUNITY): Payer: Self-pay | Admitting: Emergency Medicine

## 2020-06-06 ENCOUNTER — Other Ambulatory Visit: Payer: Self-pay | Admitting: Family Medicine

## 2020-06-06 ENCOUNTER — Emergency Department (HOSPITAL_COMMUNITY): Payer: 59

## 2020-06-06 ENCOUNTER — Emergency Department (HOSPITAL_COMMUNITY)
Admission: EM | Admit: 2020-06-06 | Discharge: 2020-06-07 | Disposition: A | Discharge status: Home / Self Care | Payer: 59 | Attending: Emergency Medicine | Admitting: Emergency Medicine

## 2020-06-06 ENCOUNTER — Other Ambulatory Visit: Payer: Self-pay

## 2020-06-06 DIAGNOSIS — Z452 Encounter for adjustment and management of vascular access device: Secondary | ICD-10-CM | POA: Insufficient documentation

## 2020-06-06 DIAGNOSIS — I10 Essential (primary) hypertension: Secondary | ICD-10-CM | POA: Insufficient documentation

## 2020-06-06 MED ORDER — ALTEPLASE 2 MG IJ SOLR
2.0000 mg | Freq: Once | INTRAMUSCULAR | Status: AC
  Administered 2020-06-07: 2 mg
  Filled 2020-06-06 (×2): qty 2

## 2020-06-06 NOTE — ED Triage Notes (Signed)
Pt reports her PICC line will not flush x several days; reports PICC line is used to [employer], meds and fluids

## 2020-06-06 NOTE — ED Provider Notes (Signed)
[facility] EMERGENCY DEPARTMENT Provider Note   CSN: [id number] Arrival date & time: 06/06/20  1854     History Chief Complaint  Patient presents with  . Vascular Access Problem    [name] is a 60 y.o. female.  Patient presents to the emergency department for evaluation of nonfunctional PICC line.  Patient reports that she has had this PICC line in her left arm for approximately 3 weeks.  She received IV fluids and nutrition through her PICC line.  Patient has not noticed any increased pain at the PICC line site, has not had any redness or drainage.  She reports that she has been unable to flush the line tonight.        Past Medical History:  Diagnosis Date  . Anxiety   . Arthritis   . Colon perforation (HCC)    due to appendicitis  . GERD (gastroesophageal reflux disease)   . Hypertension   . S/P endoscopy    2008: noncritical Schatzki ring s/p dilation, normal esophagus and stomach, 2003: normal esophagus, s/p Maloney dilation, multiple antral erosions consistent with chronic gastritis, no H.pylori,     Patient Active Problem List   Diagnosis Date Noted  . Acute renal failure (ARF) (HCC) 05/14/2020  . Lactic acidosis 05/14/2020  . Dehydration 05/14/2020  . PICC line infection, initial encounter   . 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 Small bowel perforation (HCC)   . S/P partial colectomy 12/04/2019  . Peritonitis due to abscess (HCC)   . Colon perforation (HCC)   . 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 Primary osteoarthritis of right knee   . Chest pain 03/02/2018  . Acquired trigger finger of right middle finger   . Knee pain, right 04/06/2015  . Dysphagia 10/09/2010  . GERD (gastroesophageal reflux disease) 10/09/2010  . Encounter for screening colonoscopy 10/09/2010    Past Surgical History:  Procedure Laterality Date  . APPENDECTOMY    . BOWEL RESECTION N/A 12/06/2019   Procedure: PARTIAL SMALL BOWEL RESECTION WITH ILEOSTOMY;   Surgeon: Franky Macho, MD;  Location: [facility] OR[facility];  Service: General;  Laterality: N/A;  . CENTRAL LINE INSERTION N/A 12/06/2019   Procedure: CENTRAL LINE INSERTION;  Surgeon: Franky Macho, MD;  Location: [facility];  Service: General;  Laterality: N/A;  . CESAREAN SECTION    . CHOLECYSTECTOMY    . COLONOSCOPY  10/17/2010   normal   . ESOPHAGOGASTRODUODENOSCOPY  10/17/2010   non-critical Schatzki's ring s/p dilation, patulous EG junction, small hiatal hernia, otherwise normal stomach, first and second portion of duodenum  . ESOPHAGOGASTRODUODENOSCOPY N/A 10/26/2018   Procedure: ESOPHAGOGASTRODUODENOSCOPY (EGD);  Surgeon: Corbin Ade, MD;  Location: [facility];  Service: Endoscopy;  Laterality: N/A;  9:15am  . FOOT SURGERY Right    heel spur  . HAND SURGERY Left    carpal tunnel  . HERNIA REPAIR N/A    approx.    10 or 12 years ago  . KNEE ARTHROSCOPY WITH MEDIAL MENISECTOMY  01/07/2012   Procedure: KNEE ARTHROSCOPY WITH MEDIAL MENISECTOMY;  Surgeon: Darreld Mclean, MD;  Location: [facility];  Service: Orthopedics;  Laterality: Right;  . LAPAROTOMY N/A 12/04/2019   Procedure: EXPLORATORY LAPAROTOMY;  Surgeon: Franky Macho, MD;  Location: [facility];  Service: General;  Laterality: N/A;  . LAPAROTOMY N/A 12/06/2019   Procedure: EXPLORATORY LAPAROTOMY;  Surgeon: Franky Macho, MD;  Location: [facility];  Service: General;  Laterality: N/A;  . MALONEY DILATION  10/17/2010   Procedure: MALONEY DILATION;  Surgeon: Corbin Ade, MD;  Location: [facility];  Service: Endoscopy;  Laterality: N/A;  . [name] DILATION N/A 10/26/2018   Procedure: [name] DILATION;  Surgeon: Corbin Ade, MD;  Location[facility];  Service: Endoscopy;  Laterality: N/A;  . PARTIAL COLECTOMY Right 12/04/2019   Procedure: RIGHT HEMI COLECTOMY WITH TERMINAL ILEUM RESECTION;  Surgeon: Franky Macho, MD;  Location: [facility];  Service: General;  Laterality: Right;  . TOTAL KNEE ARTHROPLASTY Right 04/18/2019   Procedure: RIGHT TOTAL  KNEE ARTHROPLASTY;  Surgeon: Vickki Hearing, MD;  Location: [facility];  Service: Orthopedics;  Laterality: Right;  . TRIGGER FINGER RELEASE Right 08/20/2016   Procedure: RELEASE A-1 PULLEY RIGHT LONG FINGER;  Surgeon: Vickki Hearing, MD;  Location: [facility];  Service: Orthopedics;  Laterality: Right;     OB History   No obstetric history on file.     Family History  Problem Relation Age of Onset  . Cancer Father        esophagus ca, living  . Colon cancer Paternal Uncle        in 37s, early 35s  . Colon polyps Neg Hx     Social History   Tobacco Use  . Smoking status: Never Smoker  . Smokeless tobacco: Never Used  Vaping Use  . Vaping Use: Never used  Substance Use Topics  . Alcohol use: Yes    Comment: drinks gin on weekends   . Drug use: No    Home Medications Prior to Admission medications   Medication Sig Start Date End Date Taking? Authorizing Provider  HYDROcodone-acetaminophen (NORCO/VICODIN) 5-325 MG tablet Take 1 tablet by mouth every 4 (four) hours as needed for severe pain. 05/06/20 05/06/21  Lucretia Roers, MD  insulin lispro (HUMALOG) 100 UNIT/ML injection 2-10 Units. Sliding scale 1 Units by Subcutaneous route three times daily as needed for Other (sliding scale insulin). 70-150=0 units 151-200=2 units 201-250=4 units 251-300=6 units 301-350=8 units 351-400=10 units    [provider]  KLOR-CON M20 20 MEQ tablet Take 20 mEq by mouth 2 (two) times daily. 03/26/20   [provider]  loperamide HCl (IMODIUM) 1 MG/7.5ML suspension Take 15 mLs (2 mg total) by mouth every 6 (six) hours as needed for diarrhea or loose stools. 05/21/20   Sherryll Burger, Pratik D, DO  pantoprazole (PROTONIX) 40 MG tablet Take 40 mg by mouth daily.    [provider]  thiamine 100 MG tablet Take 100 mg by mouth daily.    [provider]  Vitamin D, Cholecalciferol, 10 MCG (400 UNIT) CAPS Take by mouth.    [provider]  zinc sulfate 220 (50  Zn) MG capsule Take 220 mg by mouth daily.    [provider]    Allergies    5-alpha reductase inhibitors, Maxipime [cefepime], Metronidazole, and Acetaminophen-codeine  Review of Systems   Review of Systems  Skin: Negative for color change.  All other systems reviewed and are negative.   Physical Exam Updated Vital Signs BP 125/77 (BP Location: Right Arm)   Pulse (!) 114   Temp 97.6 F (36.4 C) (Oral)   Resp 18   Ht 5\' 1"  (1.549 m)   Wt 65.3 kg   SpO2 100%   BMI 27.21 kg/m   Physical Exam Vitals and nursing note reviewed.  Constitutional:      General: She is not in acute distress.    Appearance: Normal appearance. She is well-developed.  HENT:  Head: Normocephalic and atraumatic.     Right Ear: Hearing normal.     Left Ear: Hearing normal.     Nose: Nose normal.  Eyes:     Conjunctiva/sclera: Conjunctivae normal.     Pupils: Pupils are equal, round, and reactive to light.  Cardiovascular:     Rate and Rhythm: Regular rhythm.     Heart sounds: S1 normal and S2 normal. No murmur heard. No friction rub. No gallop.   Pulmonary:     Effort: Pulmonary effort is normal. No respiratory distress.     Breath sounds: Normal breath sounds.  Chest:     Chest wall: No tenderness.  Abdominal:     General: Bowel sounds are normal.     Palpations: Abdomen is soft.     Tenderness: There is no abdominal tenderness. There is no guarding or rebound. Negative signs include Murphy's sign and McBurney's sign.     Hernia: No hernia is present.  Musculoskeletal:        General: Normal range of motion.     Cervical back: Normal range of motion and neck supple.  Skin:    General: Skin is warm and dry.     Findings: No rash.     Comments: PICC line site appears normal, no drainage, erythema, swelling or induration.  Neurological:     Mental Status: She is alert and oriented to person, place, and time.     GCS: GCS eye subscore is 4. GCS verbal subscore is 5. GCS motor  subscore is 6.     Cranial Nerves: No cranial nerve deficit.     Sensory: No sensory deficit.     Coordination: Coordination normal.  Psychiatric:        Speech: Speech normal.        Behavior: Behavior normal.        Thought Content: Thought content normal.     ED Results / Procedures / Treatments   Labs (all labs ordered are listed, but only abnormal results are displayed) Labs Reviewed - No data to display  EKG None  [facility]  Result Date: 06/06/2020 CLINICAL DATA:  Malfunctioning PICC line. EXAM: PORTABLE CHEST 1 VIEW COMPARISON:  May 14, 2020 FINDINGS: A left-sided PICC line is seen with its distal tip noted at the junction of the superior vena cava and right atrium. This represents a new finding when compared to the prior study. The heart size and mediastinal contours are within normal limits. Both lungs are clear. The visualized skeletal structures are unremarkable. IMPRESSION: 1. Left-sided PICC line positioning, as described above, without acute or active cardiopulmonary disease. Electronically Signed   By: Aram Candelahaddeus  Houston M.D.   On: 06/06/2020 21:22    Procedures Procedures   Medications Ordered in ED Medications  alteplase (CATHFLO ACTIVASE) injection 2 mg (2 mg Intracatheter Given 06/07/20 0053)    ED Course  I have reviewed the triage vital signs and the nursing notes.  Pertinent labs & imaging results that were available during my care of the patient were reviewed by me and considered in my medical decision making (see chart for details).    MDM Rules/Calculators/A&P                          Patient with PICC line for TPN.  She cannot flush her PICC line so she presented to the ED.  Attempts to unclog it were unsuccessful.  I do not  have access to anyone who can replace a PICC line at this time.  The site otherwise looks good.  No sign of infection.  X-ray shows that placement is okay.  We will place a peripheral IV for her to use  temporarily, follow-up with her physician later today when the office is open for replacement.  Final Clinical Impression(s) / ED Diagnoses Final diagnoses:  Encounter for assessment of peripherally inserted central catheter (PICC)    Rx / DC Orders ED Discharge Orders    None       Angeliah Wisdom, Canary Brim, MD 06/07/20 0147

## 2020-06-06 NOTE — ED Provider Notes (Signed)
Patient placed in Quick Look pathway, seen and evaluated   Chief Complaint: PICC line problem  HPI:   Pt reports PICC not working since yesterday.  Home health RN tried to flush yesterday but could not  ROS: no fever, no chills   Physical Exam:   Gen: No distress  Neuro: Awake and Alert  Skin: Warm    Focused Exam: PICC left arm. No redness    Initiation of care has begun. The patient has been counseled on the process, plan, and necessity for staying for the completion/evaluation, and the remainder of the medical screening examination   [name] 06/06/20 2004    Eber Hong, MD 06/14/20 [id number]

## 2020-06-07 ENCOUNTER — Other Ambulatory Visit (INDEPENDENT_AMBULATORY_CARE_PROVIDER_SITE_OTHER): Payer: Self-pay | Admitting: General Surgery

## 2020-06-07 DIAGNOSIS — K632 Fistula of intestine: Secondary | ICD-10-CM

## 2020-06-07 NOTE — Discharge Instructions (Addendum)
We are unable to replace your PICC line.  You will need to call your home health nurse in the morning so they can help you get the PICC line replaced.

## 2020-06-07 NOTE — ED Notes (Signed)
Attempted to flush double-lumen picc line at this time. Unable to flush. EDP notified.

## 2020-06-07 NOTE — ED Notes (Signed)
IV placed and pt being discharged home with IV in place, per Dr. Blinda Leatherwood.

## 2020-06-10 ENCOUNTER — Other Ambulatory Visit: Payer: Self-pay | Admitting: General Surgery

## 2020-06-10 ENCOUNTER — Other Ambulatory Visit: Payer: Self-pay

## 2020-06-10 ENCOUNTER — Ambulatory Visit (HOSPITAL_COMMUNITY)
Admission: RE | Admit: 2020-06-10 | Discharge: 2020-06-10 | Disposition: A | Discharge status: Home / Self Care | Payer: 59 | Source: Ambulatory Visit | Attending: General Surgery | Admitting: General Surgery

## 2020-06-10 DIAGNOSIS — T82594A Other mechanical complication of infusion catheter, initial encounter: Secondary | ICD-10-CM | POA: Diagnosis not present

## 2020-06-10 DIAGNOSIS — K632 Fistula of intestine: Secondary | ICD-10-CM

## 2020-06-10 DIAGNOSIS — Y848 Other medical procedures as the cause of abnormal reaction of the patient, or of later complication, without mention of misadventure at the time of the procedure: Secondary | ICD-10-CM | POA: Diagnosis not present

## 2020-06-10 DIAGNOSIS — T82898A Other specified complication of vascular prosthetic devices, implants and grafts, initial encounter: Secondary | ICD-10-CM | POA: Diagnosis present

## 2020-06-10 DIAGNOSIS — K651 Peritoneal abscess: Secondary | ICD-10-CM | POA: Diagnosis not present

## 2020-06-10 DIAGNOSIS — K631 Perforation of intestine (nontraumatic): Secondary | ICD-10-CM | POA: Diagnosis not present

## 2020-06-10 MED ORDER — HEPARIN SOD (PORK) LOCK FLUSH 100 UNIT/ML IV SOLN
INTRAVENOUS | Status: AC
  Filled 2020-06-10: qty 5

## 2020-06-10 MED ORDER — CHLORHEXIDINE GLUCONATE 4 % EX LIQD
CUTANEOUS | Status: AC
  Filled 2020-06-10: qty 15

## 2020-06-10 MED ORDER — LIDOCAINE HCL (PF) 1 % IJ SOLN
INTRAMUSCULAR | Status: AC
  Filled 2020-06-10: qty 30

## 2020-06-10 NOTE — Procedures (Signed)
Successful image guided exchange of left dual lumen PICC line Length 40 cm Tip at lower SVC/RA PICC capped No complications Ready for use.  EBL < 5 mL   Brayton El PA-C 06/10/2020 1:16 PM

## 2020-06-14 ENCOUNTER — Other Ambulatory Visit (HOSPITAL_COMMUNITY): Payer: Self-pay | Admitting: General Surgery

## 2020-06-14 ENCOUNTER — Ambulatory Visit (HOSPITAL_COMMUNITY)
Admission: RE | Admit: 2020-06-14 | Discharge: 2020-06-14 | Disposition: A | Discharge status: Home / Self Care | Payer: 59 | Source: Ambulatory Visit | Attending: General Surgery | Admitting: General Surgery

## 2020-06-14 ENCOUNTER — Other Ambulatory Visit: Payer: Self-pay

## 2020-06-14 DIAGNOSIS — T82594A Other mechanical complication of infusion catheter, initial encounter: Secondary | ICD-10-CM | POA: Diagnosis not present

## 2020-06-14 DIAGNOSIS — Y848 Other medical procedures as the cause of abnormal reaction of the patient, or of later complication, without mention of misadventure at the time of the procedure: Secondary | ICD-10-CM | POA: Diagnosis not present

## 2020-06-14 HISTORY — PX: IR FLUORO GUIDE CV LINE LEFT: IMG2282

## 2020-06-14 MED ORDER — LIDOCAINE HCL 1 % IJ SOLN
INTRAMUSCULAR | Status: AC
  Filled 2020-06-14: qty 20

## 2020-06-14 MED ORDER — HEPARIN SOD (PORK) LOCK FLUSH 100 UNIT/ML IV SOLN
INTRAVENOUS | Status: AC
  Filled 2020-06-14: qty 5

## 2020-06-14 NOTE — Procedures (Signed)
Pt with chronic TPN and IV therapy needs, has had (L)UE for months. Recently exchanged a few days ago due to tube kinking/obsruction. Has now returned for same. (L)UE PICC spot imaging shows tube has retracted and coiled in subcutaneous tissue of upper arm. Decision made to remove left PICC altogether and proceed with placement of new (R)UE PICC   Successful placement of dual lumen PICC line to right brachial vein. Length 35 cm Tip at lower SVC/RA PICC capped No complications Ready for use.  EBL < 5 mL   Brayton El PA-C 06/14/2020 4:19 PM

## 2020-06-17 DIAGNOSIS — K651 Peritoneal abscess: Secondary | ICD-10-CM | POA: Diagnosis not present

## 2020-06-17 DIAGNOSIS — K632 Fistula of intestine: Secondary | ICD-10-CM | POA: Diagnosis not present

## 2020-06-17 DIAGNOSIS — K631 Perforation of intestine (nontraumatic): Secondary | ICD-10-CM | POA: Diagnosis not present

## 2020-06-27 ENCOUNTER — Other Ambulatory Visit (INDEPENDENT_AMBULATORY_CARE_PROVIDER_SITE_OTHER): Payer: Self-pay | Admitting: General Surgery

## 2020-06-27 DIAGNOSIS — Z09 Encounter for follow-up examination after completed treatment for conditions other than malignant neoplasm: Secondary | ICD-10-CM

## 2020-06-27 MED ORDER — HYDROCODONE-ACETAMINOPHEN 5-325 MG PO TABS
1.0000 | ORAL_TABLET | ORAL | 0 refills | Status: DC | PRN
Start: 2020-06-27 — End: 2020-08-07

## 2020-06-27 NOTE — Progress Notes (Signed)
Reordered Norco for pain.

## 2020-07-16 ENCOUNTER — Encounter: Payer: Self-pay | Admitting: General Surgery

## 2020-07-16 ENCOUNTER — Other Ambulatory Visit: Payer: Self-pay

## 2020-07-16 ENCOUNTER — Ambulatory Visit (INDEPENDENT_AMBULATORY_CARE_PROVIDER_SITE_OTHER): Payer: 59 | Admitting: General Surgery

## 2020-07-16 VITALS — BP 121/80 | HR 94 | Temp 98.6°F | Resp 14 | Ht 61.0 in | Wt 157.0 lb

## 2020-07-16 DIAGNOSIS — K632 Fistula of intestine: Secondary | ICD-10-CM | POA: Diagnosis not present

## 2020-07-16 NOTE — Progress Notes (Signed)
Subjective:     [name]  Patient here for follow-up of ileostomy.  She had bleeding around the ileostomy skin site recently.  They applied a paste and this seemed to make it better.  Over the past few weeks, she has had episodes where she has had 1 week of no drainage from the enterocutaneous fistula.  She is starting to eat more digestible food at this point.  She is still receiving TPN nocturnally.  She is in good spirits and is now mobile.  PICC line is working. Objective:    BP 121/80   Pulse 94   Temp 98.6 F (37 C) (Other (Comment))   Resp 14   Ht 5\' 1"  (1.549 m)   Wt 157 lb (71.2 kg)   SpO2 98%   BMI 29.66 kg/m   General:  alert, cooperative and no distress  Abdomen is soft.  The ileostomy site is without bleeding.  The enterocutaneous fistula along the mid portion of the abdomen seems smaller than it was the last time I saw it.  I did not takedown the ostomy.     Assessment:    Ileostomy with bleeding, resolved   Enterocutaneous fistula, slowly resolving. Overall, the patient is making a remarkable recovery.   Plan:   Continue current management.  I have been in contact with [facility] and they say they would not consider exploration until October of this year.  Patient understands that.  Follow-up as needed.

## 2020-08-07 ENCOUNTER — Other Ambulatory Visit (INDEPENDENT_AMBULATORY_CARE_PROVIDER_SITE_OTHER): Payer: 59 | Admitting: General Surgery

## 2020-08-07 DIAGNOSIS — Z09 Encounter for follow-up examination after completed treatment for conditions other than malignant neoplasm: Secondary | ICD-10-CM

## 2020-08-07 MED ORDER — HYDROCODONE-ACETAMINOPHEN 5-325 MG PO TABS: 1.0000 | ORAL_TABLET | ORAL | 0 refills | Status: DC | PRN

## 2020-08-07 NOTE — Progress Notes (Signed)
Reordered pain medication for enterocutaneous fistula

## 2020-08-29 ENCOUNTER — Encounter: Payer: Self-pay | Admitting: General Surgery

## 2020-08-29 ENCOUNTER — Ambulatory Visit (INDEPENDENT_AMBULATORY_CARE_PROVIDER_SITE_OTHER): Payer: 59 | Admitting: General Surgery

## 2020-08-29 ENCOUNTER — Other Ambulatory Visit: Payer: Self-pay

## 2020-08-29 VITALS — BP 115/79 | HR 104 | Temp 98.5°F | Resp 16 | Ht 61.0 in | Wt 168.0 lb

## 2020-08-29 DIAGNOSIS — T80219A Unspecified infection due to central venous catheter, initial encounter: Secondary | ICD-10-CM | POA: Diagnosis not present

## 2020-08-29 NOTE — Progress Notes (Signed)
Subjective:     [name]  Patient here for follow-up.  She again is having episodes where she starts having fevers at night when the TPN is started.  In the past, she had this happen and the PICC line had to be removed.  She is eating solid food and her enterocutaneous fistula drainage has been intermittently less.  Her ileostomy is working well. Objective:    BP 115/79   Pulse (!) 104   Temp 98.5 F (36.9 C) (Temporal)   Resp 16   Ht 5\' 1"  (1.549 m)   Wt 168 lb (76.2 kg)   SpO2 96%   BMI 31.74 kg/m   General:  alert, cooperative, and no distress  Abdomen is soft.  A bag is present over the midline enterocutaneous fistula which has some succus present.  Ileostomy is pink and patent. PICC line from right arm removed.  Pressure held.     Assessment:    As I think her PICC line has become infected again, it was removed.  Her enterocutaneous fistula seems to be closing slowly on its own.  She is maintaining oral intake.    Plan:   Stop TPN for now.  I will see the patient again in follow-up next week to see how she is doing.  No need to start an antibiotic for now as the PICC line has been removed.

## 2020-09-02 ENCOUNTER — Emergency Department (HOSPITAL_COMMUNITY): Payer: 59

## 2020-09-02 ENCOUNTER — Inpatient Hospital Stay (HOSPITAL_COMMUNITY)
Admission: EM | Admit: 2020-09-02 | Discharge: 2020-09-06 | DRG: 674 | Disposition: A | Discharge status: Home / Self Care | Payer: 59 | Attending: General Surgery | Admitting: General Surgery

## 2020-09-02 ENCOUNTER — Encounter (HOSPITAL_COMMUNITY): Payer: Self-pay

## 2020-09-02 ENCOUNTER — Other Ambulatory Visit: Payer: Self-pay

## 2020-09-02 DIAGNOSIS — K632 Fistula of intestine: Secondary | ICD-10-CM | POA: Diagnosis present

## 2020-09-02 DIAGNOSIS — M1990 Unspecified osteoarthritis, unspecified site: Secondary | ICD-10-CM | POA: Diagnosis present

## 2020-09-02 DIAGNOSIS — R059 Cough, unspecified: Secondary | ICD-10-CM | POA: Diagnosis present

## 2020-09-02 DIAGNOSIS — Z888 Allergy status to other drugs, medicaments and biological substances status: Secondary | ICD-10-CM

## 2020-09-02 DIAGNOSIS — Z885 Allergy status to narcotic agent status: Secondary | ICD-10-CM

## 2020-09-02 DIAGNOSIS — Z933 Colostomy status: Secondary | ICD-10-CM

## 2020-09-02 DIAGNOSIS — N179 Acute kidney failure, unspecified: Principal | ICD-10-CM | POA: Diagnosis present

## 2020-09-02 DIAGNOSIS — R202 Paresthesia of skin: Secondary | ICD-10-CM | POA: Diagnosis not present

## 2020-09-02 DIAGNOSIS — Z20822 Contact with and (suspected) exposure to covid-19: Secondary | ICD-10-CM | POA: Diagnosis present

## 2020-09-02 DIAGNOSIS — E876 Hypokalemia: Secondary | ICD-10-CM | POA: Diagnosis present

## 2020-09-02 DIAGNOSIS — E860 Dehydration: Secondary | ICD-10-CM | POA: Diagnosis present

## 2020-09-02 DIAGNOSIS — Z96651 Presence of right artificial knee joint: Secondary | ICD-10-CM | POA: Diagnosis present

## 2020-09-02 DIAGNOSIS — K219 Gastro-esophageal reflux disease without esophagitis: Secondary | ICD-10-CM | POA: Diagnosis present

## 2020-09-02 DIAGNOSIS — E871 Hypo-osmolality and hyponatremia: Secondary | ICD-10-CM | POA: Diagnosis present

## 2020-09-02 DIAGNOSIS — I10 Essential (primary) hypertension: Secondary | ICD-10-CM | POA: Diagnosis present

## 2020-09-02 DIAGNOSIS — Z800 Family history of malignant neoplasm of digestive organs: Secondary | ICD-10-CM | POA: Diagnosis not present

## 2020-09-02 DIAGNOSIS — R200 Anesthesia of skin: Secondary | ICD-10-CM

## 2020-09-02 LAB — RESP PANEL BY RT-PCR (FLU A&B, COVID) ARPGX2
Influenza A by PCR: NEGATIVE
Influenza B by PCR: NEGATIVE
SARS Coronavirus 2 by RT PCR: NEGATIVE

## 2020-09-02 LAB — COMPREHENSIVE METABOLIC PANEL
ALT: 39 U/L (ref 0–44)
AST: 48 U/L — ABNORMAL HIGH (ref 15–41)
Albumin: 3.6 g/dL (ref 3.5–5.0)
Alkaline Phosphatase: 373 U/L — ABNORMAL HIGH (ref 38–126)
Anion gap: 20 — ABNORMAL HIGH (ref 5–15)
BUN: 70 mg/dL — ABNORMAL HIGH (ref 6–20)
CO2: 23 mmol/L (ref 22–32)
Calcium: 8.8 mg/dL — ABNORMAL LOW (ref 8.9–10.3)
Chloride: 80 mmol/L — ABNORMAL LOW (ref 98–111)
Creatinine, Ser: 2.47 mg/dL — ABNORMAL HIGH (ref 0.44–1.00)
GFR, Estimated: 22 mL/min — ABNORMAL LOW (ref >60)
Glucose, Bld: 179 mg/dL — ABNORMAL HIGH (ref 70–99)
Potassium: 3.8 mmol/L (ref 3.5–5.1)
Sodium: 123 mmol/L — ABNORMAL LOW (ref 135–145)
Total Bilirubin: 1.5 mg/dL — ABNORMAL HIGH (ref 0.3–1.2)
Total Protein: 10.3 g/dL — ABNORMAL HIGH (ref 6.5–8.1)

## 2020-09-02 LAB — CBC
HCT: 40.6 % (ref 36.0–46.0)
Hemoglobin: 14 g/dL (ref 12.0–15.0)
MCH: 27.8 pg (ref 26.0–34.0)
MCHC: 34.5 g/dL (ref 30.0–36.0)
MCV: 80.6 fL (ref 80.0–100.0)
Platelets: 576 10*3/uL — ABNORMAL HIGH (ref 150–400)
RBC: 5.04 MIL/uL (ref 3.87–5.11)
RDW: 12.6 % (ref 11.5–15.5)
WBC: 15.9 10*3/uL — ABNORMAL HIGH (ref 4.0–10.5)
nRBC: 0 % (ref 0.0–0.2)

## 2020-09-02 LAB — DIFFERENTIAL
Abs Immature Granulocytes: 0.08 10*3/uL — ABNORMAL HIGH (ref 0.00–0.07)
Basophils Absolute: 0.1 10*3/uL (ref 0.0–0.1)
Basophils Relative: 0 %
Eosinophils Absolute: 0 10*3/uL (ref 0.0–0.5)
Eosinophils Relative: 0 %
Immature Granulocytes: 1 %
Lymphocytes Relative: 13 %
Lymphs Abs: 2.1 10*3/uL (ref 0.7–4.0)
Monocytes Absolute: 1.2 10*3/uL — ABNORMAL HIGH (ref 0.1–1.0)
Monocytes Relative: 7 %
Neutro Abs: 12.5 10*3/uL — ABNORMAL HIGH (ref 1.7–7.7)
Neutrophils Relative %: 79 %

## 2020-09-02 LAB — I-STAT CHEM 8, ED
BUN: 61 mg/dL — ABNORMAL HIGH (ref 6–20)
Calcium, Ion: 0.94 mmol/L — ABNORMAL LOW (ref 1.15–1.40)
Chloride: 84 mmol/L — ABNORMAL LOW (ref 98–111)
Creatinine, Ser: 2.6 mg/dL — ABNORMAL HIGH (ref 0.44–1.00)
Glucose, Bld: 191 mg/dL — ABNORMAL HIGH (ref 70–99)
HCT: 47 % — ABNORMAL HIGH (ref 36.0–46.0)
Hemoglobin: 16 g/dL — ABNORMAL HIGH (ref 12.0–15.0)
Potassium: 3.9 mmol/L (ref 3.5–5.1)
Sodium: 123 mmol/L — ABNORMAL LOW (ref 135–145)
TCO2: 24 mmol/L (ref 22–32)

## 2020-09-02 LAB — PROTIME-INR
INR: 1.1 (ref 0.8–1.2)
Prothrombin Time: 14.1 seconds (ref 11.4–15.2)

## 2020-09-02 LAB — LACTIC ACID, PLASMA
Lactic Acid, Venous: 3 mmol/L (ref 0.5–1.9)
Lactic Acid, Venous: 1.9 mmol/L (ref 0.5–1.9)

## 2020-09-02 LAB — PHOSPHORUS: Phosphorus: 7.7 mg/dL — ABNORMAL HIGH (ref 2.5–4.6)

## 2020-09-02 LAB — ETHANOL: Alcohol, Ethyl (B): <10 mg/dL (ref <10)

## 2020-09-02 LAB — CBG MONITORING, ED: Glucose-Capillary: 191 mg/dL — ABNORMAL HIGH (ref 70–99)

## 2020-09-02 LAB — MAGNESIUM: Magnesium: 2 mg/dL (ref 1.7–2.4)

## 2020-09-02 LAB — APTT: aPTT: 26 seconds (ref 24–36)

## 2020-09-02 MED ORDER — ACETAMINOPHEN 650 MG RE SUPP: 650.0000 mg | Freq: Four times a day (QID) | RECTAL | Status: DC | PRN

## 2020-09-02 MED ORDER — ACETAMINOPHEN 325 MG PO TABS
650.0000 mg | ORAL_TABLET | Freq: Four times a day (QID) | ORAL | Status: DC | PRN
  Administered 2020-09-02 – 2020-09-05 (×2): 650 mg via ORAL
  Filled 2020-09-02 (×3): qty 2

## 2020-09-02 MED ORDER — PANTOPRAZOLE SODIUM 40 MG IV SOLR
40.0000 mg | Freq: Every day | INTRAVENOUS | Status: DC
  Administered 2020-09-02 – 2020-09-05 (×4): 40 mg via INTRAVENOUS
  Filled 2020-09-02 (×4): qty 40

## 2020-09-02 MED ORDER — SODIUM CHLORIDE 0.9 % IV BOLUS
1000.0000 mL | Freq: Once | INTRAVENOUS | Status: AC
Start: 2020-09-02 — End: 2020-09-02
  Administered 2020-09-02: 1000 mL via INTRAVENOUS

## 2020-09-02 MED ORDER — LORAZEPAM 2 MG/ML IJ SOLN: 1.0000 mg | INTRAMUSCULAR | Status: DC | PRN

## 2020-09-02 MED ORDER — ONDANSETRON 4 MG PO TBDP: 4.0000 mg | ORAL_TABLET | Freq: Four times a day (QID) | ORAL | Status: DC | PRN

## 2020-09-02 MED ORDER — LOPERAMIDE HCL 2 MG PO CAPS: 4.0000 mg | ORAL_CAPSULE | Freq: Four times a day (QID) | ORAL | Status: DC | PRN

## 2020-09-02 MED ORDER — SIMETHICONE 80 MG PO CHEW: 40.0000 mg | CHEWABLE_TABLET | Freq: Four times a day (QID) | ORAL | Status: DC | PRN

## 2020-09-02 MED ORDER — SODIUM CHLORIDE 0.9 % IV BOLUS
1000.0000 mL | Freq: Once | INTRAVENOUS | Status: AC
  Administered 2020-09-02: 1000 mL via INTRAVENOUS

## 2020-09-02 MED ORDER — SODIUM CHLORIDE 0.9 % IV SOLN: INTRAVENOUS | Status: DC

## 2020-09-02 MED ORDER — MORPHINE SULFATE (PF) 2 MG/ML IV SOLN
2.0000 mg | INTRAVENOUS | Status: DC | PRN
  Administered 2020-09-02 – 2020-09-05 (×6): 2 mg via INTRAVENOUS
  Filled 2020-09-02 (×6): qty 1

## 2020-09-02 MED ORDER — ONDANSETRON HCL 4 MG/2ML IJ SOLN: 4.0000 mg | Freq: Four times a day (QID) | INTRAMUSCULAR | Status: DC | PRN

## 2020-09-02 MED ORDER — HEPARIN SODIUM (PORCINE) 5000 UNIT/ML IJ SOLN
5000.0000 [IU] | Freq: Three times a day (TID) | INTRAMUSCULAR | Status: DC
  Administered 2020-09-02 – 2020-09-03 (×2): 5000 [IU] via SUBCUTANEOUS
  Filled 2020-09-02 (×2): qty 1

## 2020-09-02 NOTE — ED Provider Notes (Signed)
Emergency Department Provider Note   I have reviewed the triage vital signs and the nursing notes.   HISTORY  Chief Complaint Numbness   HPI [name] is a 60 y.o. female with past medical history of enterocutaneous fistula recently evaluated for likely PICC line infection and removed on 7/7 with Dr. Lovell Sheehan presents with body aches, chills, and fatigue.  She is unsure regarding fever.  She has had poor appetite and some vomiting yesterday but none today.  She has felt some mild nausea.  She is feeling pain all over including in her chest.  Her ileostomy continues to have good output.  She is tolerating oral fluids and solids.  She was driving into the hospital to be evaluated for the body aches/pain and in route developed numbness to the right face.  She states it started around the right side of her mouth and then spread to the right forehead and jaw.  She denies pain.  She is not having numbness in the arm or leg.  No weakness.  No vision or speech changes.  Past Medical History:  Diagnosis Date   Anxiety    Arthritis    Colon perforation (HCC)    due to appendicitis   GERD (gastroesophageal reflux disease)    Hypertension    S/P endoscopy    2008: noncritical Schatzki ring s/p dilation, normal esophagus and stomach, 2003: normal esophagus, s/p Maloney dilation, multiple antral erosions consistent with chronic gastritis, no H.pylori,     Patient Active Problem List   Diagnosis Date Noted   Hyponatremia 09/02/2020   Acute renal failure (ARF) (HCC) 05/14/2020   Lactic acidosis 05/14/2020   Dehydration 05/14/2020   PICC line infection, initial encounter    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   Small bowel perforation (HCC)    S/P partial colectomy 12/04/2019   Peritonitis due to abscess [facility])    Colon perforation (HCC)    S/P total knee replacement, 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   Primary osteoarthritis of right knee    Chest pain 03/02/2018   Acquired trigger finger of right  middle finger    Knee pain, right 04/06/2015   Dysphagia 10/09/2010   GERD (gastroesophageal reflux disease) 10/09/2010   Encounter for screening colonoscopy 10/09/2010    Past Surgical History:  Procedure Laterality Date   APPENDECTOMY     BOWEL RESECTION N/A 12/06/2019   Procedure: PARTIAL SMALL BOWEL RESECTION WITH ILEOSTOMY;  Surgeon: Franky Macho, MD;  Location: [facility];  Service: General;  Laterality: N/A;   CENTRAL LINE INSERTION N/A 12/06/2019   Procedure: CENTRAL LINE INSERTION;  Surgeon: Franky Macho, MD;  Location: [facility];  Service: General;  Laterality: N/A;   CESAREAN SECTION     CHOLECYSTECTOMY     COLONOSCOPY  10/17/2010   normal    ESOPHAGOGASTRODUODENOSCOPY  10/17/2010   non-critical Schatzki's ring s/p dilation, patulous EG junction, small hiatal hernia, otherwise normal stomach, first and second portion of duodenum   ESOPHAGOGASTRODUODENOSCOPY N/A 10/26/2018   Procedure: ESOPHAGOGASTRODUODENOSCOPY (EGD);  Surgeon: Corbin Ade, MD;  Location: [facility];  Service: Endoscopy;  Laterality: N/A;  9:15am   FOOT SURGERY Right    heel spur   HAND SURGERY Left    carpal tunnel   HERNIA REPAIR N/A    approx.    10 or 12 years ago   IR FLUORO GUIDE CV LINE LEFT  06/14/2020   KNEE ARTHROSCOPY WITH MEDIAL MENISECTOMY  01/07/2012   Procedure: KNEE ARTHROSCOPY WITH MEDIAL MENISECTOMY;  Surgeon: Deniece Portela  Hilda Lias, MD;  Location: [facility];  Service: Orthopedics;  Laterality: Right;   LAPAROTOMY N/A 12/04/2019   Procedure: EXPLORATORY LAPAROTOMY;  Surgeon: Franky Macho, MD;  Location: [facility];  Service: General;  Laterality: N/A;   LAPAROTOMY N/A 12/06/2019   Procedure: EXPLORATORY LAPAROTOMY;  Surgeon: Franky Macho, MD;  Location: [facility];  Service: General;  Laterality: N/A;   MALONEY DILATION  10/17/2010   Procedure: Alvy Beal;  Surgeon: Corbin Ade, MD;  Location: [facility];  Service: Endoscopy;  Laterality: N/A;   MALONEY DILATION N/A 10/26/2018   Procedure:  [name] DILATION;  Surgeon: Corbin Ade, MD;  Location: [facility];  Service: Endoscopy;  Laterality: N/A;   PARTIAL COLECTOMY Right 12/04/2019   Procedure: RIGHT HEMI COLECTOMY WITH TERMINAL ILEUM RESECTION;  Surgeon: Franky Macho, MD;  Location: [facility];  Service: General;  Laterality: Right;   TOTAL KNEE ARTHROPLASTY Right 04/18/2019   Procedure: RIGHT TOTAL KNEE ARTHROPLASTY;  Surgeon: Vickki Hearing, MD;  Location: [facility];  Service: Orthopedics;  Laterality: Right;   TRIGGER FINGER RELEASE Right 08/20/2016   Procedure: RELEASE A-1 PULLEY RIGHT Carine Nordgren FINGER;  Surgeon: Vickki Hearing, MD;  Location: [facility];  Service: Orthopedics;  Laterality: Right;    Allergies 5-alpha reductase inhibitors, Maxipime [cefepime], Metronidazole, and Acetaminophen-codeine  Family History  Problem Relation Age of Onset   Cancer Father        esophagus ca, living   Colon cancer Paternal Uncle        in 85s, early 43s   Colon polyps Neg Hx     Social History Social History   Tobacco Use   Smoking status: Never   Smokeless tobacco: Never  Vaping Use   Vaping Use: Never used  Substance Use Topics   Alcohol use: Yes    Comment: drinks gin on weekends    Drug use: No    Review of Systems  Constitutional: Subjective fever with body aches.  Eyes: No visual changes. ENT: No sore throat. Cardiovascular: Denies chest pain. Respiratory: Denies shortness of breath. Gastrointestinal: No abdominal pain. Positive nausea and vomiting. Ileostomy with normal output.  Genitourinary: Negative for dysuria. Musculoskeletal: Negative for back pain. Skin: Negative for rash. Neurological: Negative for headaches. Numbness to the right face. No weakness.   10-point ROS otherwise negative.  ____________________________________________   PHYSICAL EXAM:  VITAL SIGNS: ED Triage Vitals  Enc Vitals Group     BP 09/02/20 1051 123/89     Pulse Rate 09/02/20 1051 (!) 113     Resp 09/02/20 1051 18      Temp --      Temp src --      SpO2 09/02/20 1051 99 %     Weight 09/02/20 1052 163 lb (73.9 kg)     Height 09/02/20 1052 5\' 1"  (1.549 m)   Constitutional: Alert and oriented. Well appearing and in no acute distress. Eyes: Conjunctivae are normal.  Head: Atraumatic. Nose: No congestion/rhinnorhea. Mouth/Throat: Mucous membranes are slightly dry.  Neck: No stridor.  Cardiovascular: Tachycardia. Good peripheral circulation. Grossly normal heart sounds.   Respiratory: Normal respiratory effort.  No retractions. Lungs CTAB. Gastrointestinal: Soft with midl diffuse tenderness. No peritoneal findings. Ileostomy over the right abdomen with enterocutaneous fistula to the midline. No distention.  Musculoskeletal: No gross deformities of extremities. Neurologic:  Normal speech and language.  Decreased sensation to light touch of the right face.  Skin:  Skin is warm, dry and intact. No rash noted.  ____________________________________________  LABS (all labs ordered are listed, but only abnormal results are displayed)  Labs Reviewed  CBC - Abnormal; Notable for the following components:      Result Value   WBC 15.9 (*)    Platelets 576 (*)    All other components within normal limits  DIFFERENTIAL - Abnormal; Notable for the following components:   Neutro Abs 12.5 (*)    Monocytes Absolute 1.2 (*)    Abs Immature Granulocytes 0.08 (*)    All other components within normal limits  COMPREHENSIVE METABOLIC PANEL - Abnormal; Notable for the following components:   Sodium 123 (*)    Chloride 80 (*)    Glucose, Bld 179 (*)    BUN 70 (*)    Creatinine, Ser 2.47 (*)    Calcium 8.8 (*)    Total Protein 10.3 (*)    AST 48 (*)    Alkaline Phosphatase 373 (*)    Total Bilirubin 1.5 (*)    GFR, Estimated 22 (*)    Anion gap 20 (*)    All other components within normal limits  LACTIC ACID, PLASMA - Abnormal; Notable for the following components:   Lactic Acid, Venous 3.0 (*)    All  other components within normal limits  PHOSPHORUS - Abnormal; Notable for the following components:   Phosphorus 7.7 (*)    All other components within normal limits  CBC - Abnormal; Notable for the following components:   RBC 3.81 (*)    Hemoglobin 10.6 (*)    HCT 32.2 (*)    All other components within normal limits  BASIC METABOLIC PANEL - Abnormal; Notable for the following components:   Sodium 132 (*)    Potassium 2.8 (*)    Glucose, Bld 107 (*)    BUN 39 (*)    Calcium 8.3 (*)    All other components within normal limits  CBC - Abnormal; Notable for the following components:   RBC 3.46 (*)    Hemoglobin 9.8 (*)    HCT 30.5 (*)    Platelets 415 (*)    All other components within normal limits  BASIC METABOLIC PANEL - Abnormal; Notable for the following components:   Sodium 130 (*)    Glucose, Bld 139 (*)    Calcium 8.7 (*)    All other components within normal limits  I-STAT CHEM 8, ED - Abnormal; Notable for the following components:   Sodium 123 (*)    Chloride 84 (*)    BUN 61 (*)    Creatinine, Ser 2.60 (*)    Glucose, Bld 191 (*)    Calcium, Ion 0.94 (*)    Hemoglobin 16.0 (*)    HCT 47.0 (*)    All other components within normal limits  CBG MONITORING, ED - Abnormal; Notable for the following components:   Glucose-Capillary 191 (*)    All other components within normal limits  RESP PANEL BY RT-PCR (FLU A&B, COVID) ARPGX2  CULTURE, BLOOD (ROUTINE X 2)  CULTURE, BLOOD (ROUTINE X 2)  ETHANOL  PROTIME-INR  APTT  LACTIC ACID, PLASMA  MAGNESIUM  MAGNESIUM  PHOSPHORUS   ____________________________________________  EKG   EKG Interpretation  Date/Time:  Monday September 02 2020 10:56:33 EDT Ventricular Rate:  112 PR Interval:  140 QRS Duration: 82 QT Interval:  341 QTC Calculation: 466 R Axis:   -11 Text Interpretation: Sinus tachycardia Low voltage, precordial leads Nonspecific T abnrm, anterolateral leads Confirmed by Alona Bene [phone]) on 09/02/2020  11:19:37 AM  ____________________________________________  RADIOLOGY  CT head and CXR reviewed.   ____________________________________________   PROCEDURES  Procedure(s) performed:   Procedures  CRITICAL CARE Performed by: Maia PlanJoshua G Eliot Popper Total critical care time: 35 minutes Critical care time was exclusive of separately billable procedures and treating other patients. Critical care was necessary to treat or prevent imminent or life-threatening deterioration. Critical care was time spent personally by me on the following activities: development of treatment plan with patient and/or surrogate as well as nursing, discussions with consultants, evaluation of patient's response to treatment, examination of patient, obtaining history from patient or surrogate, ordering and performing treatments and interventions, ordering and review of laboratory studies, ordering and review of radiographic studies, pulse oximetry and re-evaluation of patient's condition.  Alona BeneJoshua Mikhia Dusek, MD Emergency Medicine  ____________________________________________   INITIAL IMPRESSION / ASSESSMENT AND PLAN / ED COURSE  Pertinent labs & imaging results that were available during my care of the patient were reviewed by me and considered in my medical decision making (see chart for details).   Patient presents to the emergency department with body aches, subjective fever, and fatigue after having her PICC line removed with concern for infection.  She has tachycardia but is afebrile and not hypoxemic.  No hypotension.  Her ileostomy appears to be functioning well.  She had some vomiting last night but not today.  In route to the emergency department for evaluation she developed numbness to the right face.  She had been normal up until that point.  No history of stroke.  I did activate a code stroke and will have neurology evaluate her promptly.  CT imaging of the head discussed with radiology with no acute  findings.  Labs reviewed and discussed with Neurology and Dr. Lovell SheehanJenkins. Surgery to admit. Continue IVF and hold on abxx for now but cultures sent. Patient scheduled for MRI.   Discussed patient's case with Surgery to request admission. Patient and family (if present) updated with plan. Care transferred to Surgery service.  I reviewed all nursing notes, vitals, pertinent old records, EKGs, labs, imaging (as available).  ____________________________________________  FINAL CLINICAL IMPRESSION(S) / ED DIAGNOSES  Final diagnoses:  AKI (acute kidney injury) (HCC)  Numbness     MEDICATIONS GIVEN DURING THIS VISIT:  Medications  acetaminophen (TYLENOL) tablet 650 mg (650 mg Oral Given 09/02/20 1540)    Or  acetaminophen (TYLENOL) suppository 650 mg ( Rectal See Alternative 09/02/20 1540)  morphine 2 MG/ML injection 2 mg (2 mg Intravenous Given 09/03/20 2158)  ondansetron (ZOFRAN-ODT) disintegrating tablet 4 mg (has no administration in time range)    Or  ondansetron (ZOFRAN) injection 4 mg (has no administration in time range)  simethicone (MYLICON) chewable tablet 40 mg (has no administration in time range)  pantoprazole (PROTONIX) injection 40 mg (40 mg Intravenous Given 09/03/20 2155)  LORazepam (ATIVAN) injection 1 mg (has no administration in time range)  loperamide (IMODIUM) capsule 4 mg (has no administration in time range)  enoxaparin (LOVENOX) injection 40 mg (40 mg Subcutaneous Given 09/03/20 2157)  dextrose 5 % and 0.45 % NaCl with KCl 20 mEq/L infusion ( Intravenous New Bag/Given 09/03/20 1200)  sodium chloride 0.9 % bolus 1,000 mL (0 mLs Intravenous Stopped 09/02/20 1343)  sodium chloride 0.9 % bolus 1,000 mL (0 mLs Intravenous Stopped 09/03/20 0850)  potassium chloride 10 mEq in 100 mL IVPB (10 mEq Intravenous New Bag/Given 09/03/20 1846)      Note:  This document was prepared using Dragon voice recognition software and may include  unintentional dictation errors.  Alona Bene,  MD, [facility] Emergency Medicine    Epifania Littrell, Arlyss Repress, MD 09/04/20 [phone]

## 2020-09-02 NOTE — H&P (Signed)
[facility]    Chief Complaint   Numbness     HPI: [name] is a 60 y.o. female who presents to the ED with cramping, vomiting and weakness. She had a PICC line removed on 7/7 , due to fevers when it was used. The cramping occurs diffusely throughout the body, and  started two days ago, with the vomiting occurring last night. She claims to have had an output of 1600  mLs through her ileostomy bag. Over this period she has had trouble keeping liquids down. She also has some numbness on the right side of her face, but no visible deficits. She woke up this morning and felt weak, and was instructed to come to the hospital by Dr. Lovell Sheehan.  Past Medical History:  Diagnosis Date   Anxiety    Arthritis    Colon perforation (HCC)    due to appendicitis   GERD (gastroesophageal reflux disease)    Hypertension    S/P endoscopy    2008: noncritical Schatzki ring s/p dilation, normal esophagus and stomach, 2003: normal esophagus, s/p Maloney dilation, multiple antral erosions consistent with chronic gastritis, no H.pylori,     Past Surgical History:  Procedure Laterality Date   APPENDECTOMY     BOWEL RESECTION N/A 12/06/2019   Procedure: PARTIAL SMALL BOWEL RESECTION WITH ILEOSTOMY;  Surgeon: Franky Macho, MD;  Location: [facility];  Service: General;  Laterality: N/A;   CENTRAL LINE INSERTION N/A 12/06/2019   Procedure: CENTRAL LINE INSERTION;  Surgeon: Franky Macho, MD;  Location: [facility];  Service: General;  Laterality: N/A;   CESAREAN SECTION     CHOLECYSTECTOMY     COLONOSCOPY  10/17/2010   normal    ESOPHAGOGASTRODUODENOSCOPY  10/17/2010   non-critical Schatzki's ring s/p dilation, patulous EG junction, small hiatal hernia, otherwise normal stomach, first and second portion of duodenum   ESOPHAGOGASTRODUODENOSCOPY N/A 10/26/2018   Procedure: ESOPHAGOGASTRODUODENOSCOPY (EGD);  Surgeon: Corbin Ade, MD;  [facility];  Service: Endoscopy;  Laterality: N/A;   9:15am   FOOT SURGERY Right    heel spur   HAND SURGERY Left    carpal tunnel   HERNIA REPAIR N/A    approx.    10 or 12 years ago   IR FLUORO GUIDE CV LINE LEFT  06/14/2020   KNEE ARTHROSCOPY WITH MEDIAL MENISECTOMY  01/07/2012   Procedure: KNEE ARTHROSCOPY WITH MEDIAL MENISECTOMY;  Surgeon: Darreld Mclean, MD;  Location: [facility];  Service: Orthopedics;  Laterality: Right;   LAPAROTOMY N/A 12/04/2019   Procedure: EXPLORATORY LAPAROTOMY;  Surgeon: Franky Macho, MD;  Location: [facility];  Service: General;  Laterality: N/A;   LAPAROTOMY N/A 12/06/2019   Procedure: EXPLORATORY LAPAROTOMY;  Surgeon: Franky Macho, MD;  Location: [facility];  Service: General;  Laterality: N/A;   MALONEY DILATION  10/17/2010   Procedure: Alvy Beal;  Surgeon: Corbin Ade, MD;  [facility];  Service: Endoscopy;  Laterality: N/A;   MALONEY DILATION N/A 10/26/2018   Procedure: [name] DILATION;  Surgeon: Corbin Ade, MD;  [facility]: [facility];  Service: Endoscopy;  Laterality: N/A;   PARTIAL COLECTOMY Right 12/04/2019   Procedure: RIGHT HEMI COLECTOMY WITH TERMINAL ILEUM RESECTION;  Surgeon: Franky Macho, MD;  Location: [facility];  Service: General;  Laterality: Right;   TOTAL KNEE ARTHROPLASTY Right 04/18/2019   Procedure: RIGHT TOTAL KNEE ARTHROPLASTY;  Surgeon: Vickki Hearing, MD;  Location: [facility];  Service: Orthopedics;  Laterality: Right;   TRIGGER FINGER RELEASE  Right 08/20/2016   Procedure: RELEASE A-1 PULLEY RIGHT LONG FINGER;  Surgeon: Vickki Hearing, MD;  Location: [facility];  Service: Orthopedics;  Laterality: Right;    Family History  Problem Relation Age of Onset   Cancer Father        esophagus ca, living   Colon cancer Paternal Uncle        in 70s, early 64s   Colon polyps Neg Hx     Social History   Tobacco Use   Smoking status: Never   Smokeless tobacco: Never  Vaping Use   Vaping Use: Never used  Substance Use Topics   Alcohol use: Yes    Comment: drinks gin  on weekends    Drug use: No    Medications: I have reviewed the patient's current medications.  Allergies  Allergen Reactions   5-Alpha Reductase Inhibitors    Maxipime [Cefepime]     Hives     Metronidazole     hives   Acetaminophen-Codeine Rash    Tylenol with Codeine Tylenol #3,     ROS:  Pertinent items are noted in HPI.  Blood pressure 116/64, pulse 73, resp. rate (!) 21, height  (1.549 m), weight 73.9 kg, SpO2 98 %. Physical Exam Vitals reviewed.  Constitutional:      Appearance: Normal appearance.  HENT:     Head: Normocephalic and atraumatic.  Cardiovascular:     Rate and Rhythm: Normal rate and regular rhythm.     Pulses: Normal pulses.  Pulmonary:     Effort: Pulmonary effort is normal.     Breath sounds: Normal breath sounds.  Abdominal:     General: Abdomen is flat.     Palpations: Abdomen is soft.     Comments: Pt has an enterocutaneous fistula and an Ostomy bag, that are actively producing output during exam  Neurological:     Mental Status: She is alert.  Psychiatric:        Behavior: Behavior normal.    Results: Results for orders placed or performed during the hospital encounter of 09/02/20 (from the past 48 hour(s))  Resp Panel by RT-PCR (Flu A&B, Covid) Nasopharyngeal Swab     Status: None   Collection Time: 09/02/20 10:59 AM   Specimen: Nasopharyngeal Swab; Nasopharyngeal(NP) swabs in vial transport medium  Result Value Ref Range   SARS Coronavirus 2 by RT PCR NEGATIVE NEGATIVE    Comment: (NOTE) SARS-CoV-2 target nucleic acids are NOT DETECTED.  The SARS-CoV-2 RNA is generally detectable in upper respiratory specimens during the acute phase of infection. The lowest concentration of SARS-CoV-2 viral copies this assay can detect is 138 copies/mL. A negative result does not preclude SARS-Cov-2 infection and should not be used as the sole basis for treatment or other patient management decisions. A negative result may occur with   improper specimen collection/handling, submission of specimen other than nasopharyngeal swab, presence of viral mutation(s) within the areas targeted by this assay, and inadequate number of viral copies(<138 copies/mL). A negative result must be combined with clinical observations, patient history, and epidemiological information. The expected result is Negative.  Fact Sheet for Patients:  BloggerCourse.com  Fact Sheet for Healthcare Providers:  SeriousBroker.it  This test is no t yet approved or cleared by the [place] FDA and  has been authorized for detection and/or diagnosis of SARS-CoV-2 by FDA under an Emergency Use Authorization (EUA). This EUA will remain  in effect (meaning this test can be used) for the duration of the  COVID-19 declaration under Section 564(b)(1) of the Act, 21 U.S.C.section 360bbb-3(b)(1), unless the authorization is terminated  or revoked sooner.       Influenza A by PCR NEGATIVE NEGATIVE   Influenza B by PCR NEGATIVE NEGATIVE    Comment: (NOTE) The Xpert Xpress SARS-CoV-2/FLU/RSV plus assay is intended as an aid in the diagnosis of influenza from Nasopharyngeal swab specimens and should not be used as a sole basis for treatment. Nasal washings and aspirates are unacceptable for Xpert Xpress SARS-CoV-2/FLU/RSV testing.  Fact Sheet for Patients: BloggerCourse.com  Fact Sheet for Healthcare Providers: SeriousBroker.it  This test is not yet approved or cleared by the [place] FDA and has been authorized for detection and/or diagnosis of SARS-CoV-2 by FDA under an Emergency Use Authorization (EUA). This EUA will remain in effect (meaning this test can be used) for the duration of the COVID-19 declaration under Section 564(b)(1) of the Act, 21 U.S.C. section 360bbb-3(b)(1), unless the authorization is terminated or revoked.  Performed at  [facility], [address], [place], [place] [address]   CBG monitoring, ED     Status: Abnormal   Collection Time: 09/02/20 11:00 AM  Result Value Ref Range   Glucose-Capillary 191 (H) 70 - 99 mg/dL    Comment: Glucose reference range applies only to samples taken after fasting for at least 8 hours.  Ethanol     Status: None   Collection Time: 09/02/20 11:20 AM  Result Value Ref Range   Alcohol, Ethyl (B) <10 <10 mg/dL    Comment: (NOTE) Lowest detectable limit for serum alcohol is 10 mg/dL.  For medical purposes only. Performed at [facility], [address], [place], [place] [address]   Protime-INR     Status: None   Collection Time: 09/02/20 11:20 AM  Result Value Ref Range   Prothrombin Time 14.1 11.4 - 15.2 seconds   INR 1.1 0.8 - 1.2    Comment: (NOTE) INR goal varies based on device and disease states. Performed at [facility], [address] [place], [place] [address]   APTT     Status: None   Collection Time: 09/02/20 11:20 AM  Result Value Ref Range   aPTT 26 24 - 36 seconds    Comment: Performed at [facility], [address], [place], [place] [address]  CBC     Status: Abnormal   Collection Time: 09/02/20 11:20 AM  Result Value Ref Range   WBC 15.9 (H) 4.0 - 10.5 K/uL   RBC 5.04 3.87 - 5.11 MIL/uL   Hemoglobin 14.0 12.0 - 15.0 g/dL   HCT 65.7 84.6 - 96.2 %   MCV 80.6 80.0 - 100.0 fL   MCH 27.8 26.0 - 34.0 pg   MCHC 34.5 30.0 - 36.0 g/dL   RDW 95.2 84.1 - 32.4 %   Platelets 576 (H) 150 - 400 K/uL   nRBC 0.0 0.0 - 0.2 %    Comment: Performed at [facility], [address], [place], [place] [address]  Differential     Status: Abnormal   Collection Time: 09/02/20 11:20 AM  Result Value Ref Range   Neutrophils Relative % 79 %   Neutro Abs 12.5 (H) 1.7 - 7.7 K/uL   Lymphocytes Relative 13 %   Lymphs Abs 2.1 0.7 - 4.0 K/uL   Monocytes Relative 7 %   Monocytes Absolute 1.2 (H) 0.1 - 1.0 K/uL   Eosinophils Relative 0 %   Eosinophils Absolute 0.0 0.0 - 0.5  K/uL   Basophils  Relative 0 %   Basophils Absolute 0.1 0.0 - 0.1 K/uL   Immature Granulocytes 1 %   Abs Immature Granulocytes 0.08 (H) 0.00 - 0.07 K/uL    Comment: Performed at [facility], [address] [place], [place] [address]  Comprehensive metabolic panel     Status: Abnormal   Collection Time: 09/02/20 11:20 AM  Result Value Ref Range   Sodium 123 (L) 135 - 145 mmol/L   Potassium 3.8 3.5 - 5.1 mmol/L   Chloride 80 (L) 98 - 111 mmol/L   CO2 23 22 - 32 mmol/L   Glucose, Bld 179 (H) 70 - 99 mg/dL    Comment: Glucose reference range applies only to samples taken after fasting for at least 8 hours.   BUN 70 (H) 6 - 20 mg/dL   Creatinine, Ser 1.912.47 (H) 0.44 - 1.00 mg/dL   Calcium 8.8 (L) 8.9 - 10.3 mg/dL   Total Protein 47.810.3 (H) 6.5 - 8.1 g/dL   Albumin 3.6 3.5 - 5.0 g/dL   AST 48 (H) 15 - 41 U/L   ALT 39 0 - 44 U/L   Alkaline Phosphatase 373 (H) 38 - 126 U/L   Total Bilirubin 1.5 (H) 0.3 - 1.2 mg/dL   GFR, Estimated 22 (L) >60 mL/min    Comment: (NOTE) Calculated using the CKD-EPI Creatinine Equation (2021)    Anion gap 20 (H) 5 - 15    Comment: Performed at [facility], [address] [place], [place] [address]  Lactic acid, plasma     Status: Abnormal   Collection Time: 09/02/20 11:20 AM  Result Value Ref Range   Lactic Acid, Venous 3.0 (HH) 0.5 - 1.9 mmol/L    Comment: CRITICAL RESULT CALLED TO, READ BACK BY AND VERIFIED WITH: ROWLAND,T@1150  by MATTHEWS,B 7.11.22 Performed at [facility], [address] [place], [place] [address]   Culture, blood (routine x 2)     Status: None (Preliminary result)   Collection Time: 09/02/20 11:20 AM   Specimen: BLOOD  Result Value Ref Range   Specimen Description BLOOD LEFT ANTECUBITAL    Special Requests      BOTTLES DRAWN AEROBIC AND ANAEROBIC Blood Culture results may not be optimal due to an inadequate volume of blood received in culture bottles Performed at [facility], [address] [place], [place] [address]    Culture  PENDING    Report Status PENDING   I-stat chem 8, ED     Status: Abnormal   Collection Time: 09/02/20 11:26 AM  Result Value Ref Range   Sodium 123 (L) 135 - 145 mmol/L   Potassium 3.9 3.5 - 5.1 mmol/L   Chloride 84 (L) 98 - 111 mmol/L   BUN 61 (H) 6 - 20 mg/dL   Creatinine, Ser 9.622.60 (H) 0.44 - 1.00 mg/dL   Glucose, Bld 952191 (H) 70 - 99 mg/dL    Comment: Glucose reference range applies only to samples taken after fasting for at least 8 hours.   Calcium, Ion 0.94 (L) 1.15 - 1.40 mmol/L   TCO2 24 22 - 32 mmol/L   Hemoglobin 16.0 (H) 12.0 - 15.0 g/dL   HCT 84.147.0 (H) 32.436.0 - 40.146.0 %  Culture, blood (routine x 2)     Status: None (Preliminary result)   Collection Time: 09/02/20 11:49 AM   Specimen: BLOOD  Result Value Ref Range   Specimen Description BLOOD RIGHT HAND    Special Requests      BOTTLES DRAWN AEROBIC AND ANAEROBIC Blood  Culture adequate volume Performed at [facility], [address], [place], [place] [address]    Culture PENDING    Report Status PENDING   Magnesium     Status: None   Collection Time: 09/02/20 12:54 PM  Result Value Ref Range   Magnesium 2.0 1.7 - 2.4 mg/dL    Comment: Performed at [facility], [address] [place], [place] [address]  Phosphorus     Status: Abnormal   Collection Time: 09/02/20 12:54 PM  Result Value Ref Range   Phosphorus 7.7 (H) 2.5 - 4.6 mg/dL    Comment: Performed at [facility], [address], [place], [place] [address]  Lactic acid, plasma     Status: None   Collection Time: 09/02/20  1:50 PM  Result Value Ref Range   Lactic Acid, Venous 1.9 0.5 - 1.9 mmol/L    Comment: Performed at [facility], [address] [place], [place] [address]    DG Chest 1 View  Result Date: 09/02/2020 CLINICAL DATA:  Right-sided facial numbness.  Body aches and cough. EXAM: CHEST  1 VIEW COMPARISON:  06/06/2020. FINDINGS: Trachea is midline. Heart size normal. Lungs are clear. No pleural fluid. Degenerative changes in the shoulders. IMPRESSION:  No acute findings. Electronically Signed   By: Leanna Battles M.D.   On: 09/02/2020 11:20   CT HEAD CODE STROKE WO CONTRAST  Result Date: 09/02/2020 CLINICAL DATA:  Code stroke.  Right leg numbness EXAM: CT HEAD WITHOUT CONTRAST TECHNIQUE: Contiguous axial images were obtained from the base of the skull through the vertex without intravenous contrast. COMPARISON:  None. FINDINGS: Brain: No evidence of acute infarction, hemorrhage, hydrocephalus, extra-axial collection or mass lesion/mass effect. Vascular: No hyperdense vessel or unexpected calcification. Skull: Normal. Negative for fracture or focal lesion. Sinuses/Orbits: No acute finding. Other: These results were called by telephone at the time of interpretation on 09/02/2020 at 11:11 am to provider JOSHUA LONG , who verbally acknowledged these results. ASPECTS [facility] Stroke Program Early CT Score) - Ganglionic level infarction (caudate, lentiform nuclei, internal capsule, insula, M1-M3 cortex): 7 - Supraganglionic infarction (M4-M6 cortex): 3 Total score (0-10 with 10 being normal): 10 IMPRESSION: Negative head CT. Electronically Signed   By: Marnee Spring M.D.   On: 09/02/2020 11:11     Assessment & Plan:  [name] is a 60 y.o. female with a past medical history of colon perforation, enterocutaneous fistula, partial colectomy and PICC line infection who presents today with cramping, vomiting, weakness and facial numbness. -Cramping is likely due to dehydration, and will be treated with IV normal saline. Patient has hyponatremia on most recent labs and we will continue to monitor this. -Facial numbness is likely also due to dehydration, but an MRI has been obtained to help rule out other etiologies. Patient will be admitted for rehydration with IV fluids and continued monitoring of metabolic disturbances.    Marin Shutter 09/02/2020, 2:18 PM

## 2020-09-02 NOTE — ED Triage Notes (Signed)
Pt presents to ED with complaints of right sided facial numbness started 20 minutes ago. LKW 1030. Pt states she had PICC line removed by Dr Lovell Sheehan Thursday and started having body cramps on Saturday and worsening since.

## 2020-09-02 NOTE — Consult Note (Signed)
[facility] Tele[facility]icine Consult   Requesting Provider: Dr. Jacqulyn Bath Consult Participants: Dr. Marthe Patch, telemedicine stroke RN Lowanda Foster, bedside RN Roland Location of the provider:  [facility]ional hospital Location of the patient: [name] bed 10  This consult was provided via telemedicine with 2-way video and audio communication. The patient/family was informed that care would be provided in this way and agreed to receive care in this manner.    Chief Complaint: Right facial numbness  HPI: 60 year old with a past medical history of colonic perforation status post colostomy, multiple PICC lines for TPN, who had PICC line removed last Friday and started complaining of whole body cramping and was coming to the emergency room and at around 10:30 AM she noticed sudden onset of right facial numbness. Denies numbness anywhere else but reports significant weakness in her lower extremities and ability to walk. Reports that she has been able to eat and drink some but not adequately able to maintain nutrition. She was asked by her surgeon to come to the emergency room for evaluation of cramping. Code stroke was activated due to unilateral sudden onset neurological symptoms.   Past Medical History:  Diagnosis Date   Anxiety    Arthritis    Colon perforation (HCC)    due to appendicitis   GERD (gastroesophageal reflux disease)    Hypertension    S/P endoscopy    2008: noncritical Schatzki ring s/p dilation, normal esophagus and stomach, 2003: normal esophagus, s/p Maloney dilation, multiple antral erosions consistent with chronic gastritis, no H.pylori,     No current facility-administered medications for this encounter.  Current Outpatient Medications:    HYDROcodone-acetaminophen (NORCO/VICODIN) 5-325 MG tablet, Take 1 tablet by mouth every 4 (four) hours as needed for severe pain., Disp: 40 tablet, Rfl: 0   insulin lispro (HUMALOG) 100 UNIT/ML injection, 2-10 Units. Sliding  scale 1 Units by Subcutaneous route three times daily as needed for Other (sliding scale insulin). 70-150=0 units 151-200=2 units 201-250=4 units 251-300=6 units 301-350=8 units 351-400=10 units, Disp: , Rfl:    KLOR-CON M20 20 MEQ tablet, Take 20 mEq by mouth 2 (two) times daily., Disp: , Rfl:    loperamide HCl (IMODIUM) 1 MG/7.5ML suspension, Take 15 mLs (2 mg total) by mouth every 6 (six) hours as needed for diarrhea or loose stools., Disp: 120 mL, Rfl: 0   pantoprazole (PROTONIX) 40 MG tablet, Take 40 mg by mouth daily., Disp: , Rfl:    thiamine 100 MG tablet, Take 100 mg by mouth daily., Disp: , Rfl:    Vitamin D, Cholecalciferol, 10 MCG (400 UNIT) CAPS, Take by mouth., Disp: , Rfl:    zinc sulfate 220 (50 Zn) MG capsule, Take 220 mg by mouth daily., Disp: , Rfl:     LKW: 10:30 AM today tpa given?: No, low NIH IR Thrombectomy? No, exam not suspicious for LVO Modified Rankin Scale: 2-Slight disability-UNABLE to perform all activities but does not need assistance Time of teleneurologist evaluation: 11:03 AM  Exam: Vitals:   09/02/20 1051  BP: 123/89  Pulse: (!) 113  Resp: 18  SpO2: 99%    General: Awake, alert, mildly distressed due to her whole body cramping HEENT: Normocephalic/atraumatic CVS: Regular rate rhythm on the monitor Neurological exam Awake alert oriented x3 No dysarthria No aphasia Cranial nerves: Pupils equal round reactive light, extraocular intact, visual field full, facial sensation diminished to light touch on the right, face symmetric, tongue and palate midline. Motor examination: 5/5 in bilateral lower extremities without drift.  Minimal  effort with barely 2/5 strength in both lower extremities-limited by pain and cramping. Sensory examination: Reports subtle diminishment of light touch on the right hemibody No ataxia Gait testing deferred  NIHSS 1A: Level of Consciousness - 0 1B: Ask Month and Age - 0 1C: 'Blink Eyes' & 'Squeeze Hands' - 0 2: Test  Horizontal Extraocular Movements - 0 3: Test Visual Fields - 0 4: Test Facial Palsy - 0 5A: Test Left Arm Motor Drift - 0 5B: Test Right Arm Motor Drift - 0 6A: Test Left Leg Motor Drift - 2 6B: Test Right Leg Motor Drift - 2 7: Test Limb Ataxia - 0 8: Test Sensation - 2 9: Test Language/Aphasia- 0 10: Test Dysarthria - 0 11: Test Extinction/Inattention - 0 NIHSS score: 4   Imaging Reviewed: CT head with no acute changes.  Labs reviewed in epic and pertinent values follow: CBC-pending    Component Value Date/Time   WBC 8.4 05/20/2020 0455   RBC 3.53 (L) 05/20/2020 0455   HGB 9.9 (L) 05/20/2020 0455   HCT 32.2 (L) 05/20/2020 0455   PLT 316 05/20/2020 0455   MCV 91.2 05/20/2020 0455   MCH 28.0 05/20/2020 0455   MCHC 30.7 05/20/2020 0455   RDW 13.7 05/20/2020 0455   LYMPHSABS 2.3 05/20/2020 0455   MONOABS 0.5 05/20/2020 0455   EOSABS 0.4 05/20/2020 0455   BASOSABS 0.1 05/20/2020 0455   CMP -pending    Component Value Date/Time   NA 134 (L) 05/20/2020 0455   K 4.2 05/20/2020 0455   CL 100 05/20/2020 0455   CO2 26 05/20/2020 0455   GLUCOSE 87 05/20/2020 0455   BUN 18 05/20/2020 0455   CREATININE 0.53 05/20/2020 0455   CALCIUM 8.4 (L) 05/20/2020 0455   PROT 6.5 05/20/2020 0455   ALBUMIN 2.2 (L) 05/20/2020 0455   AST 30 05/20/2020 0455   ALT 23 05/20/2020 0455   ALKPHOS 146 (H) 05/20/2020 0455   BILITOT 0.4 05/20/2020 0455   GFRNONAA >60 05/20/2020 0455   GFRAA >60 04/19/2019 0433     Assessment: 60 year old woman with above past medical history with sudden onset of right facial numbness and on examination subtle right hemibody numbness to light touch in the setting of recent PICC line removal, being on TPN. Examination other than the subjective sensory loss is nonfocal-she has weakness in both her lower extremities-difficult to localize. Due to the not localizable exam and low NIH stroke scale of 4, IV tPA was not administered. MRI of the brain should be  performed to rule out a stroke. Whole-body cramping work-up per primary team.  Question hypocalcemia amongst other metabolic abnormalities as etiology of current presentation.  Recommendations:  MRI brain without contrast-stroke work-up only if that reveals a stroke. CBC, BMP, urinalysis. Treatment of electrolyte abnormalities if any found on labs.  Discussed with EDP Dr. Alona Bene.  This patient is receiving care for possible acute neurological changes. There was 32 minutes of care by this provider at the time of service, including time for direct evaluation via telemedicine, review of medical records, imaging studies and discussion of findings with providers, the patient and/or family.  -- Milon Dikes, MD [facility] Pager: [phone] If 7pm to 7am, please call on call as listed on AMION.

## 2020-09-02 NOTE — Plan of Care (Signed)

## 2020-09-02 NOTE — ED Notes (Signed)
Pt back from CT at this time 

## 2020-09-03 ENCOUNTER — Inpatient Hospital Stay: Payer: Self-pay

## 2020-09-03 DIAGNOSIS — E871 Hypo-osmolality and hyponatremia: Secondary | ICD-10-CM | POA: Diagnosis not present

## 2020-09-03 LAB — CBC
HCT: 32.2 % — ABNORMAL LOW (ref 36.0–46.0)
Hemoglobin: 10.6 g/dL — ABNORMAL LOW (ref 12.0–15.0)
MCH: 27.8 pg (ref 26.0–34.0)
MCHC: 32.9 g/dL (ref 30.0–36.0)
MCV: 84.5 fL (ref 80.0–100.0)
Platelets: 393 10*3/uL (ref 150–400)
RBC: 3.81 MIL/uL — ABNORMAL LOW (ref 3.87–5.11)
RDW: 12.4 % (ref 11.5–15.5)
WBC: 9.1 10*3/uL (ref 4.0–10.5)
nRBC: 0 % (ref 0.0–0.2)

## 2020-09-03 LAB — BASIC METABOLIC PANEL
Anion gap: 10 (ref 5–15)
BUN: 39 mg/dL — ABNORMAL HIGH (ref 6–20)
CO2: 23 mmol/L (ref 22–32)
Calcium: 8.3 mg/dL — ABNORMAL LOW (ref 8.9–10.3)
Chloride: 99 mmol/L (ref 98–111)
Creatinine, Ser: 0.77 mg/dL (ref 0.44–1.00)
GFR, Estimated: >60 mL/min (ref >60)
Glucose, Bld: 107 mg/dL — ABNORMAL HIGH (ref 70–99)
Potassium: 2.8 mmol/L — ABNORMAL LOW (ref 3.5–5.1)
Sodium: 132 mmol/L — ABNORMAL LOW (ref 135–145)

## 2020-09-03 LAB — MAGNESIUM: Magnesium: 2.1 mg/dL (ref 1.7–2.4)

## 2020-09-03 LAB — PHOSPHORUS: Phosphorus: 3.2 mg/dL (ref 2.5–4.6)

## 2020-09-03 MED ORDER — POTASSIUM CHLORIDE 10 MEQ/100ML IV SOLN
10.0000 meq | INTRAVENOUS | Status: AC
  Administered 2020-09-03 (×5): 10 meq via INTRAVENOUS
  Filled 2020-09-03 (×6): qty 100

## 2020-09-03 MED ORDER — ENOXAPARIN SODIUM 40 MG/0.4ML IJ SOSY
40.0000 mg | PREFILLED_SYRINGE | INTRAMUSCULAR | Status: DC
  Administered 2020-09-03: 40 mg via SUBCUTANEOUS
  Filled 2020-09-03: qty 0.4

## 2020-09-03 MED ORDER — KCL IN DEXTROSE-NACL 20-5-0.45 MEQ/L-%-% IV SOLN: INTRAVENOUS | Status: DC

## 2020-09-03 MED ORDER — KCL IN DEXTROSE-NACL 40-5-0.45 MEQ/L-%-% IV SOLN: INTRAVENOUS | Status: DC

## 2020-09-03 NOTE — Progress Notes (Signed)
Secure chat message with Dr. Lovell Sheehan regarding pending blood cultures for patient with PICC ordered.  Plan is to wait 48-72 hours after blood cultures drawn to place PICC.  This plan is ok with Dr. Lovell Sheehan.  Will continue to monitor.

## 2020-09-03 NOTE — Progress Notes (Signed)
Subjective: Patient feels much better.  Less muscle twitching and discomfort noted.  Objective: Vital signs in last 24 hours: Temp:  [97.8 F (36.6 C)-98.2 F (36.8 C)] 97.8 F (36.6 C) (07/12 0355) Pulse Rate:  [79-90] 81 (07/12 0355) Resp:  [18-20] 18 (07/12 0355) BP: (101-113)/(50-72) 101/54 (07/12 0355) SpO2:  [98 %-100 %] 98 % (07/12 0355) Weight:  [72.9 kg] 72.9 kg (07/11 1455) Last BM Date: 09/02/20  Intake/Output from previous day: 07/11 0701 - 07/12 0700 In: 1877.5 [P.O.:480; I.V.:1397.5] Out: 2450 [Stool:1250] Intake/Output this shift: Total I/O In: -  Out: 1350 [Other:1200; Stool:150]  General appearance: alert, cooperative, and no distress Resp: clear to auscultation bilaterally Cardio: regular rate and rhythm, S1, S2 normal, no murmur, click, rub or gallop GI: Soft, continuing output from ileostomy and enterocutaneous fistula.  Lab Results:  Recent Labs    09/02/20 1120 09/02/20 1126 09/03/20 0529  WBC 15.9*  --  9.1  HGB 14.0 16.0* 10.6*  HCT 40.6 47.0* 32.2*  PLT 576*  --  393   BMET Recent Labs    09/02/20 1120 09/02/20 1126 09/03/20 0732  NA 123* 123* 132*  K 3.8 3.9 2.8*  CL 80* 84* 99  CO2 23  --  23  GLUCOSE 179* 191* 107*  BUN 70* 61* 39*  CREATININE 2.47* 2.60* 0.77  CALCIUM 8.8*  --  8.3*   PT/INR Recent Labs    09/02/20 1120  LABPROT 14.1  INR 1.1    Studies/Results: DG Chest 1 View  Result Date: 09/02/2020 CLINICAL DATA:  Right-sided facial numbness.  Body aches and cough. EXAM: CHEST  1 VIEW COMPARISON:  06/06/2020. FINDINGS: Trachea is midline. Heart size normal. Lungs are clear. No pleural fluid. Degenerative changes in the shoulders. IMPRESSION: No acute findings. Electronically Signed   By: Leanna Battles M.D.   On: 09/02/2020 11:20   MR BRAIN WO CONTRAST  Result Date: 09/02/2020 CLINICAL DATA:  TIA, code stroke follow-up EXAM: MRI HEAD WITHOUT CONTRAST TECHNIQUE: Multiplanar, multiecho pulse sequences of the  brain and surrounding structures were obtained without intravenous contrast. COMPARISON:  None. FINDINGS: Brain: There is no acute infarction or intracranial hemorrhage. There is no intracranial mass, mass effect, or edema. There is no hydrocephalus or extra-axial fluid collection. Ventricles and sulci are normal in size and configuration. A few small foci of T2 hyperintensity in the supratentorial white matter are nonspecific but may reflect minor chronic microvascular ischemic changes or other etiologies of gliosis/demyelination. Vascular: Major vessel flow voids at the skull base are preserved. Skull and upper cervical spine: Normal marrow signal is preserved. Sinuses/Orbits: Paranasal sinuses are aerated. Orbits are unremarkable. Other: Sella is unremarkable.  Mastoid air cells are clear. IMPRESSION: No recent infarction, hemorrhage, or mass. Electronically Signed   By: Guadlupe Spanish M.D.   On: 09/02/2020 14:40   CT HEAD CODE STROKE WO CONTRAST  Result Date: 09/02/2020 CLINICAL DATA:  Code stroke.  Right leg numbness EXAM: CT HEAD WITHOUT CONTRAST TECHNIQUE: Contiguous axial images were obtained from the base of the skull through the vertex without intravenous contrast. COMPARISON:  None. FINDINGS: Brain: No evidence of acute infarction, hemorrhage, hydrocephalus, extra-axial collection or mass lesion/mass effect. Vascular: No hyperdense vessel or unexpected calcification. Skull: Normal. Negative for fracture or focal lesion. Sinuses/Orbits: No acute finding. Other: These results were called by telephone at the time of interpretation on 09/02/2020 at 11:11 am to provider JOSHUA LONG , who verbally acknowledged these results. ASPECTS [facility] Stroke Program Early CT Score) -  Ganglionic level infarction (caudate, lentiform nuclei, internal capsule, insula, M1-M3 cortex): 7 - Supraganglionic infarction (M4-M6 cortex): 3 Total score (0-10 with 10 being normal): 10 IMPRESSION: Negative head CT. Electronically  Signed   By: Marnee Spring M.D.   On: 09/02/2020 11:11   [place] EKG SITE RITE  Result Date: 09/03/2020 If Site Rite image not attached, placement could not be confirmed due to current cardiac rhythm.   Anti-infectives: Anti-infectives (From admission, onward)    None       Assessment/Plan: Impression: Dehydration and electrolyte abnormalities, ongoing enterocutaneous fistula with ileostomy.  Suspect high output resulted in her electrolyte imbalance.  Hyponatremia resolving.  Hypokalemia noted and will be addressed.  Renal insufficiency resolving. Plan: Pending blood culture results, will replace PICC line to reinstitute home TPN.  We will advance to regular diet.  LOS: 1 day    Franky Macho 09/03/2020

## 2020-09-04 LAB — BASIC METABOLIC PANEL
Anion gap: 7 (ref 5–15)
BUN: 17 mg/dL (ref 6–20)
CO2: 22 mmol/L (ref 22–32)
Calcium: 8.7 mg/dL — ABNORMAL LOW (ref 8.9–10.3)
Chloride: 101 mmol/L (ref 98–111)
Creatinine, Ser: 0.68 mg/dL (ref 0.44–1.00)
GFR, Estimated: >60 mL/min (ref >60)
Glucose, Bld: 139 mg/dL — ABNORMAL HIGH (ref 70–99)
Potassium: 4.1 mmol/L (ref 3.5–5.1)
Sodium: 130 mmol/L — ABNORMAL LOW (ref 135–145)

## 2020-09-04 LAB — CBC
HCT: 30.5 % — ABNORMAL LOW (ref 36.0–46.0)
Hemoglobin: 9.8 g/dL — ABNORMAL LOW (ref 12.0–15.0)
MCH: 28.3 pg (ref 26.0–34.0)
MCHC: 32.1 g/dL (ref 30.0–36.0)
MCV: 88.2 fL (ref 80.0–100.0)
Platelets: 415 10*3/uL — ABNORMAL HIGH (ref 150–400)
RBC: 3.46 MIL/uL — ABNORMAL LOW (ref 3.87–5.11)
RDW: 12.6 % (ref 11.5–15.5)
WBC: 9.6 10*3/uL (ref 4.0–10.5)
nRBC: 0 % (ref 0.0–0.2)

## 2020-09-04 NOTE — Progress Notes (Addendum)
Arrived in patient room to place the PICC line. Per patient, had issues when IV/PICC team placed PICC  at bedside in both UE. Previous PICC was placed by IR. Primary RN made aware and recommended IR to place PICC line. Dr Franky Macho aware.

## 2020-09-04 NOTE — Progress Notes (Signed)
Order placed for PICC line placement by interventional radiology.  Patient will be n.p.o. after midnight.  I have stopped her Lovenox.

## 2020-09-04 NOTE — Consult Note (Signed)
WOC Nurse wound consult note Consultation was completed by review of records, images and assistance from the bedside nurse/clinical staff.    Reason for Consult: fistula and colostomy Patient known to WOC nursing  Verified with bedside nursing needs are for ostomy and fistula supplies. Patient changing at home  Wound type: EC fistula Medium Eakin pouch; Hart [place] # [phone] 2" skin barrier rings; Hart [place] [phone] 1pc flat drainable ostomy pouch; Lawson # [phone]  4 each ordered to be taken to department for patient and nursing use.    Re consult if needed, will not follow at this time. Thanks  Ankur Snowdon [employer], RN,CWOCN, CNS, CWON-[facility] [phone])

## 2020-09-04 NOTE — Progress Notes (Signed)
[facility] Progress Note     Subjective: Patient did well overnight. She experienced no pain, cramping or vomiting throughout the night. Patient slept well, and ate dinner. Patient has no issue passing urine. Patient did endorse pain in the right submandibular region which looked mildly swollen and erythematous. She is still losing fluid through her enterocutaneous fistula.  Objective: Vital signs in last 24 hours: Temp:  [98.6 F (37 C)-100.2 F (37.9 C)] 98.6 F (37 C) (07/13 0403) Pulse Rate:  [87-100] 87 (07/13 0403) Resp:  [18-20] 18 (07/13 0403) BP: (97-104)/(51-62) 101/62 (07/13 0403) SpO2:  [99 %-100 %] 99 % (07/13 0403) Last BM Date: 09/02/20  Intake/Output from previous day: 07/12 0701 - 07/13 0700 In: 436.4 [P.O.:240; I.V.:13.5; IV Piggyback:182.9] Out: 3750 [Stool:850] Intake/Output this shift: No intake/output data recorded.  General appearance: alert and cooperative Head: Normocephalic, without obvious abnormality, atraumatic Resp: clear to auscultation bilaterally Cardio: regular rate and rhythm GI: Bowel sounds present. Ileostomy and enterocutaneous fistula producing output.   Lab Results:  Recent Labs    09/03/20 0529 09/03/20 2349  WBC 9.1 9.6  HGB 10.6* 9.8*  HCT 32.2* 30.5*  PLT 393 415*   BMET Recent Labs    09/03/20 0732 09/04/20 0513  NA 132* 130*  K 2.8* 4.1  CL 99 101  CO2 23 22  GLUCOSE 107* 139*  BUN 39* 17  CREATININE 0.77 0.68  CALCIUM 8.3* 8.7*   PT/INR Recent Labs    09/02/20 1120  LABPROT 14.1  INR 1.1    Studies/Results: DG Chest 1 View  Result Date: 09/02/2020 CLINICAL DATA:  Right-sided facial numbness.  Body aches and cough. EXAM: CHEST  1 VIEW COMPARISON:  06/06/2020. FINDINGS: Trachea is midline. Heart size normal. Lungs are clear. No pleural fluid. Degenerative changes in the shoulders. IMPRESSION: No acute findings. Electronically Signed   By: Leanna Battles M.D.   On: 09/02/2020 11:20    MR BRAIN WO CONTRAST  Result Date: 09/02/2020 CLINICAL DATA:  TIA, code stroke follow-up EXAM: MRI HEAD WITHOUT CONTRAST TECHNIQUE: Multiplanar, multiecho pulse sequences of the brain and surrounding structures were obtained without intravenous contrast. COMPARISON:  None. FINDINGS: Brain: There is no acute infarction or intracranial hemorrhage. There is no intracranial mass, mass effect, or edema. There is no hydrocephalus or extra-axial fluid collection. Ventricles and sulci are normal in size and configuration. A few small foci of T2 hyperintensity in the supratentorial white matter are nonspecific but may reflect minor chronic microvascular ischemic changes or other etiologies of gliosis/demyelination. Vascular: Major vessel flow voids at the skull base are preserved. Skull and upper cervical spine: Normal marrow signal is preserved. Sinuses/Orbits: Paranasal sinuses are aerated. Orbits are unremarkable. Other: Sella is unremarkable.  Mastoid air cells are clear. IMPRESSION: No recent infarction, hemorrhage, or mass. Electronically Signed   By: Guadlupe Spanish M.D.   On: 09/02/2020 14:40   CT HEAD CODE STROKE WO CONTRAST  Result Date: 09/02/2020 CLINICAL DATA:  Code stroke.  Right leg numbness EXAM: CT HEAD WITHOUT CONTRAST TECHNIQUE: Contiguous axial images were obtained from the base of the skull through the vertex without intravenous contrast. COMPARISON:  None. FINDINGS: Brain: No evidence of acute infarction, hemorrhage, hydrocephalus, extra-axial collection or mass lesion/mass effect. Vascular: No hyperdense vessel or unexpected calcification. Skull: Normal. Negative for fracture or focal lesion. Sinuses/Orbits: No acute finding. Other: These results were called by telephone at the time of interpretation on 09/02/2020 at 11:11 am to provider JOSHUA LONG , who verbally acknowledged  these results. ASPECTS [facility] Stroke Program Early CT Score) - Ganglionic level infarction (caudate, lentiform  nuclei, internal capsule, insula, M1-M3 cortex): 7 - Supraganglionic infarction (M4-M6 cortex): 3 Total score (0-10 with 10 being normal): 10 IMPRESSION: Negative head CT. Electronically Signed   By: Marnee Spring M.D.   On: 09/02/2020 11:11   [place] EKG SITE RITE  Result Date: 09/03/2020 If Site Rite image not attached, placement could not be confirmed due to current cardiac rhythm.   Anti-infectives: Anti-infectives (From admission, onward)    None       Assessment/Plan: s/p  Patient is on day 2 of hospital stay for dehydration and electrolyte abnormalities, likely due to emesis, and fistula output. Hypokalemia has resolved, but mild hyponatremia is still present. Preliminary blood cultures showed no growth. Once blood cultures are final we will replace PICC line to restart home TPN. Patient will be ready to discharge once PICC line is in place.  LOS: 2 days    [name] 09/04/2020

## 2020-09-05 ENCOUNTER — Ambulatory Visit: Payer: 59 | Admitting: General Surgery

## 2020-09-05 DIAGNOSIS — N179 Acute kidney failure, unspecified: Principal | ICD-10-CM

## 2020-09-05 DIAGNOSIS — E871 Hypo-osmolality and hyponatremia: Secondary | ICD-10-CM | POA: Diagnosis not present

## 2020-09-05 LAB — GLUCOSE, CAPILLARY: Glucose-Capillary: 98 mg/dL (ref 70–99)

## 2020-09-05 NOTE — Progress Notes (Signed)
[facility] transport has been arranged to pick up patient and have her to [facility] tomorrow by 8am

## 2020-09-05 NOTE — Progress Notes (Signed)
IR was requested for image guided PICC line placement. Patient is currently admitted at [facility].   Of note, patent stated that she had issues when IV/PICC team placed PICC  at bedside in both UE. Informed Dr. Lovell Sheehan that PICC can only be placed at [facility] as there is no fluoro unit that can accommodate PICC placement at [facility].  Patient to be [facility] to [facility] IR tomorrow by 8 am for PICC placement.   Sent secure chat to Atilano Ina, RN at [facility], asked her to arrange [facility].  Please call IR for questions and concerns.    Lynann Bologna Bellamie Turney PA-C 09/05/2020 10:37 AM

## 2020-09-05 NOTE — Progress Notes (Signed)
  Subjective: Patient has no complaints.  Objective: Vital signs in last 24 hours: Temp:  [98 F (36.7 C)-98.4 F (36.9 C)] 98 F (36.7 C) (07/14 0623) Pulse Rate:  [74-89] 74 (07/14 0623) Resp:  [16-20] 16 (07/14 0623) BP: (105-109)/(62-72) 109/65 (07/14 0623) SpO2:  [99 %-100 %] 99 % (07/14 0623) Last BM Date: 09/04/20  Intake/Output from previous day: 07/13 0701 - 07/14 0700 In: 596 [P.O.:596] Out: 2350 [Stool:800] Intake/Output this shift: No intake/output data recorded.  General appearance: alert, cooperative, and no distress GI: Unchanged  Lab Results:  Recent Labs    09/03/20 0529 09/03/20 2349  WBC 9.1 9.6  HGB 10.6* 9.8*  HCT 32.2* 30.5*  PLT 393 415*   BMET Recent Labs    09/03/20 0732 09/04/20 0513  NA 132* 130*  K 2.8* 4.1  CL 99 101  CO2 23 22  GLUCOSE 107* 139*  BUN 39* 17  CREATININE 0.77 0.68  CALCIUM 8.3* 8.7*   PT/INR Recent Labs    09/02/20 1120  LABPROT 14.1  INR 1.1    Studies/Results: [place] EKG SITE RITE  Result Date: 09/03/2020 If Site Rite image not attached, placement could not be confirmed due to current cardiac rhythm.   Anti-infectives: Anti-infectives (From admission, onward)    None       Assessment/Plan: Impression: Renal failure and electrolyte abnormality due to high output enterocutaneous fistula with ileostomy.  This is all been corrected.  Blood cultures are negative. Plan: Patient will undergo interventional radiology PICC line placement tomorrow.  Will arrange for discharge after that has been performed.  LOS: 3 days    Franky Macho 09/05/2020

## 2020-09-05 NOTE — Progress Notes (Signed)
Patient IV site no longer swollen, some redness but tenderness to the site is also gone. Patient held ICE to site for approx 20 mins.

## 2020-09-05 NOTE — Progress Notes (Signed)
Patient's RAC Iv was flushed with 52ml saline before given patient her medication of morphine 2 mg, When nurse flushed behind med, patient reported pain. Nurse assessed site, which quickly became red. Removed IV and applied ICE to site.

## 2020-09-05 NOTE — Plan of Care (Signed)

## 2020-09-05 NOTE — Progress Notes (Signed)
Patient updated that she will receive PICC line tomorrow.

## 2020-09-06 ENCOUNTER — Ambulatory Visit (HOSPITAL_COMMUNITY)
Admission: RE | Admit: 2020-09-06 | Discharge: 2020-09-06 | Disposition: A | Discharge status: Home / Self Care | Payer: 59 | Source: Ambulatory Visit | Attending: General Surgery | Admitting: General Surgery

## 2020-09-06 ENCOUNTER — Inpatient Hospital Stay (HOSPITAL_COMMUNITY): Payer: 59

## 2020-09-06 ENCOUNTER — Ambulatory Visit (HOSPITAL_COMMUNITY): Payer: 59

## 2020-09-06 DIAGNOSIS — N179 Acute kidney failure, unspecified: Secondary | ICD-10-CM | POA: Insufficient documentation

## 2020-09-06 HISTORY — PX: IR FLUORO GUIDE CV LINE RIGHT: IMG2283

## 2020-09-06 HISTORY — PX: IR US GUIDE VASC ACCESS RIGHT: IMG2390

## 2020-09-06 MED ORDER — LIDOCAINE HCL 1 % IJ SOLN
INTRAMUSCULAR | Status: DC | PRN
  Administered 2020-09-06: 15 mL

## 2020-09-06 NOTE — Discharge Summary (Signed)
Physician Discharge Summary  Patient ID: [name] MRN: [id number] DOB/AGE: May 29, 1960 60 y.o.  Admit date: 09/02/2020 Discharge date: 09/06/2020  Admission Diagnoses: Hyponatremia, acute kidney injury, enterocutaneous fistula, ileostomy  Discharge Diagnoses: Same Active Problems:   AKI (acute kidney injury) (HCC)   Hyponatremia   Discharged Condition: good  Hospital Course: Patient is a 60 year old white female with a longstanding history of ileostomy and intermittent high output enterocutaneous fistula who has been on and off home TPN.  She has also had multiple PICC lines that have had to be removed due to fevers.  Most recently, she had a PICC line removed approximately 1 week ago due to spiking fevers while undergoing tPA infusion.  She presented to the hospital with severe hyponatremia, acute kidney injury, and dehydration.  She was mated to the hospital for IV hydration and correction of her electrolyte abnormalities.  She also had an episode of facial numbness but this resolved with correction of her electrolyte abnormalities.  Her initial MRI of her head was negative.  She continued to have a regular diet.  Once her blood cultures came back negative, she underwent PICC line placement by interventional radiology.  This was performed on 09/06/2020. Patient is being discharged home on 09/06/2020 in good and improving condition.  She may restart her home TPN as soon as its available.  Treatments: PICC line placement, 09/06/2020 by interventional radiology  Discharge Exam: Blood pressure (!) 128/111, pulse 97, temperature 98.5 F (36.9 C), temperature source Oral, resp. rate 16, height 5\' 1"  (1.549 m), weight 72.9 kg, SpO2 99 %. General appearance: alert, cooperative, and no distress Resp: clear to auscultation bilaterally Cardio: regular rate and rhythm, S1, S2 normal, no murmur, click, rub or gallop GI: Ileostomy pink and patent.  Ongoing drainage from midportion of abdomen with  enterocutaneous fistula.  Soft.  Disposition: Discharge disposition: 01-Home or Self Care       Discharge Instructions     Diet - low sodium heart healthy   Complete by: As directed    Increase activity slowly   Complete by: As directed       Allergies as of 09/06/2020       Reactions   5-alpha Reductase Inhibitors    Maxipime [cefepime]    Hives   Metronidazole    hives   Acetaminophen-codeine Rash   Tylenol with Codeine Tylenol #3,        Medication List     TAKE these medications    HYDROcodone-acetaminophen 5-325 MG tablet Commonly known as: NORCO/VICODIN Take 1 tablet by mouth every 4 (four) hours as needed for severe pain.   insulin lispro 100 UNIT/ML injection Commonly known as: HUMALOG 2-10 Units. Sliding scale 1 Units by Subcutaneous route three times daily as needed for Other (sliding scale insulin). 70-150=0 units 151-200=2 units 201-250=4 units 251-300=6 units 301-350=8 units 351-400=10 units   Klor-Con M20 20 MEQ tablet Generic drug: potassium chloride SA Take 20 mEq by mouth 2 (two) times daily.   loperamide HCl 1 MG/7.5ML suspension Commonly known as: IMODIUM Take 15 mLs (2 mg total) by mouth every 6 (six) hours as needed for diarrhea or loose stools.   pantoprazole 40 MG tablet Commonly known as: PROTONIX Take 40 mg by mouth daily.   thiamine 100 MG tablet Take 100 mg by mouth daily.   Vitamin D (Cholecalciferol) 10 MCG (400 UNIT) Caps Take by mouth.   zinc sulfate 220 (50 Zn) MG capsule Take 220 mg by mouth daily.  Follow-up Information     Franky Macho, MD Follow up.   Specialty: General Surgery Why: As needed Contact information: [address] [place] [address] [phone]                 Signed: Franky Macho 09/06/2020, 4:42 PM

## 2020-09-06 NOTE — Procedures (Signed)
Interventional Radiology Procedure Note  Procedure: Placement of a right IJ approach double lumen central venous catheter.  Tip is positioned at the superior cavoatrial junction and catheter is ready for immediate use.  Complications: None Recommendations:  - Ok to use - Do not submerge - Routine line care   Signed,  Yvone Neu. Loreta Ave, DO

## 2020-09-06 NOTE — Discharge Instructions (Signed)
May restart TPN via new PICC line.

## 2020-09-07 LAB — CULTURE, BLOOD (ROUTINE X 2)
Culture: NO GROWTH
Culture: NO GROWTH
Special Requests: ADEQUATE

## 2020-09-16 ENCOUNTER — Encounter: Payer: Self-pay | Admitting: *Deleted

## 2020-09-16 NOTE — Progress Notes (Signed)
CODE stroke times 1058 call time 1103 start time 1104 finished 1104 images sent to [facility] 1106 exam completed in [facility] [address] [facility] Radiology called

## 2020-09-19 ENCOUNTER — Other Ambulatory Visit (INDEPENDENT_AMBULATORY_CARE_PROVIDER_SITE_OTHER): Payer: 59 | Admitting: General Surgery

## 2020-09-19 DIAGNOSIS — Z09 Encounter for follow-up examination after completed treatment for conditions other than malignant neoplasm: Secondary | ICD-10-CM

## 2020-09-19 MED ORDER — HYDROCODONE-ACETAMINOPHEN 5-325 MG PO TABS: 1.0000 | ORAL_TABLET | ORAL | 0 refills | Status: DC | PRN

## 2020-09-19 NOTE — Progress Notes (Signed)
Reordered Norco for pain due to enterocutaneous fistula.

## 2020-10-08 ENCOUNTER — Other Ambulatory Visit (HOSPITAL_COMMUNITY): Payer: Self-pay | Admitting: Physician Assistant

## 2020-10-08 DIAGNOSIS — R911 Solitary pulmonary nodule: Secondary | ICD-10-CM

## 2020-10-09 ENCOUNTER — Encounter (HOSPITAL_COMMUNITY): Payer: Self-pay | Admitting: Emergency Medicine

## 2020-10-09 ENCOUNTER — Emergency Department (HOSPITAL_COMMUNITY)
Admission: EM | Admit: 2020-10-09 | Discharge: 2020-10-09 | Disposition: A | Discharge status: Home / Self Care | Payer: 59 | Attending: Emergency Medicine | Admitting: Emergency Medicine

## 2020-10-09 ENCOUNTER — Other Ambulatory Visit: Payer: Self-pay

## 2020-10-09 ENCOUNTER — Emergency Department (HOSPITAL_COMMUNITY): Payer: 59

## 2020-10-09 DIAGNOSIS — R110 Nausea: Secondary | ICD-10-CM | POA: Diagnosis not present

## 2020-10-09 DIAGNOSIS — Z96651 Presence of right artificial knee joint: Secondary | ICD-10-CM | POA: Diagnosis not present

## 2020-10-09 DIAGNOSIS — I10 Essential (primary) hypertension: Secondary | ICD-10-CM | POA: Diagnosis not present

## 2020-10-09 DIAGNOSIS — D72829 Elevated white blood cell count, unspecified: Secondary | ICD-10-CM | POA: Insufficient documentation

## 2020-10-09 DIAGNOSIS — R799 Abnormal finding of blood chemistry, unspecified: Secondary | ICD-10-CM | POA: Diagnosis present

## 2020-10-09 LAB — COMPREHENSIVE METABOLIC PANEL
ALT: 27 U/L (ref 0–44)
AST: 25 U/L (ref 15–41)
Albumin: 2.6 g/dL — ABNORMAL LOW (ref 3.5–5.0)
Alkaline Phosphatase: 370 U/L — ABNORMAL HIGH (ref 38–126)
Anion gap: 2 — ABNORMAL LOW (ref 5–15)
BUN: 25 mg/dL — ABNORMAL HIGH (ref 6–20)
CO2: 28 mmol/L (ref 22–32)
Calcium: 8.2 mg/dL — ABNORMAL LOW (ref 8.9–10.3)
Chloride: 105 mmol/L (ref 98–111)
Creatinine, Ser: 0.54 mg/dL (ref 0.44–1.00)
GFR, Estimated: >60 mL/min (ref >60)
Glucose, Bld: 121 mg/dL — ABNORMAL HIGH (ref 70–99)
Potassium: 4.8 mmol/L (ref 3.5–5.1)
Sodium: 135 mmol/L (ref 135–145)
Total Bilirubin: 0.4 mg/dL (ref 0.3–1.2)
Total Protein: 7.6 g/dL (ref 6.5–8.1)

## 2020-10-09 LAB — CBC WITH DIFFERENTIAL/PLATELET
Abs Immature Granulocytes: 0.15 10*3/uL — ABNORMAL HIGH (ref 0.00–0.07)
Basophils Absolute: 0.1 10*3/uL (ref 0.0–0.1)
Basophils Relative: 1 %
Eosinophils Absolute: 0.3 10*3/uL (ref 0.0–0.5)
Eosinophils Relative: 3 %
HCT: 32.8 % — ABNORMAL LOW (ref 36.0–46.0)
Hemoglobin: 10.2 g/dL — ABNORMAL LOW (ref 12.0–15.0)
Immature Granulocytes: 2 %
Lymphocytes Relative: 26 %
Lymphs Abs: 2.5 10*3/uL (ref 0.7–4.0)
MCH: 27.3 pg (ref 26.0–34.0)
MCHC: 31.1 g/dL (ref 30.0–36.0)
MCV: 87.9 fL (ref 80.0–100.0)
Monocytes Absolute: 0.8 10*3/uL (ref 0.1–1.0)
Monocytes Relative: 8 %
Neutro Abs: 5.8 10*3/uL (ref 1.7–7.7)
Neutrophils Relative %: 60 %
Platelets: 282 10*3/uL (ref 150–400)
RBC: 3.73 MIL/uL — ABNORMAL LOW (ref 3.87–5.11)
RDW: 14.6 % (ref 11.5–15.5)
WBC: 9.7 10*3/uL (ref 4.0–10.5)
nRBC: 0 % (ref 0.0–0.2)

## 2020-10-09 LAB — MAGNESIUM: Magnesium: 2.1 mg/dL (ref 1.7–2.4)

## 2020-10-09 LAB — PHOSPHORUS: Phosphorus: 3.7 mg/dL (ref 2.5–4.6)

## 2020-10-09 LAB — LACTIC ACID, PLASMA: Lactic Acid, Venous: 0.9 mmol/L (ref 0.5–1.9)

## 2020-10-09 NOTE — ED Triage Notes (Signed)
Pt states she has blood work done every Monday and states her nurse told her to come to the ER because her white count has doubled from last week and states shes been on antibiotics for 4 days.

## 2020-10-09 NOTE — ED Provider Notes (Signed)
[facility] EMERGENCY DEPARTMENT Provider Note   CSN: [id number] Arrival date & time: 10/09/20  9233     History Chief Complaint  Patient presents with   Abnormal Lab    [name] is a 60 y.o. female.   Abnormal Lab Patient presents with reportedly elevating white count.  She is on antibiotics for PICC line infection.  Has had previous perforated colon due to appendicitis.  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  But also getting IV vancomycin.  Reportedly ID pharmacy told her to come in because her white count had doubled from last week.  States when the PICC line gets used it often will give her chills after.  Will sometimes have nausea.  Does not happen every time but happens frequently.  Has been happening since she was in the hospital.  States that she has a similar amount of output in her ileostomy and states that the wound output has decreased.  Currently getting infusions to right chest wall PICC line.    Past Medical History:  Diagnosis Date   Anxiety    Arthritis    Colon perforation (HCC)    due to appendicitis   GERD (gastroesophageal reflux disease)    Hypertension    S/P endoscopy    2008: noncritical Schatzki ring s/p dilation, normal esophagus and stomach, 2003: normal esophagus, s/p Maloney dilation, multiple antral erosions consistent with chronic gastritis, no H.pylori,     Patient Active Problem List   Diagnosis Date Noted   Hyponatremia 09/02/2020   AKI (acute kidney injury) (HCC) 05/14/2020   Lactic acidosis 05/14/2020   Dehydration 05/14/2020   PICC line infection, initial encounter    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   Small bowel perforation (HCC)    S/P partial colectomy 12/04/2019   Peritonitis due to abscess [facility])    Colon perforation (HCC)    S/P total knee replacement, 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   Primary osteoarthritis of right knee    Chest pain 03/02/2018   Acquired trigger finger of right middle finger    Knee pain,  right 04/06/2015   Dysphagia 10/09/2010   GERD (gastroesophageal reflux disease) 10/09/2010   Encounter for screening colonoscopy 10/09/2010    Past Surgical History:  Procedure Laterality Date   APPENDECTOMY     BOWEL RESECTION N/A 12/06/2019   Procedure: PARTIAL SMALL BOWEL RESECTION WITH ILEOSTOMY;  Surgeon: Franky Macho, MD;  Location: [facility] ORS;  Service: General;  Laterality: N/A;   CENTRAL LINE INSERTION N/A 12/06/2019   Procedure: CENTRAL LINE INSERTION;  Surgeon: Franky Macho, MD;  Location: [facility] ORS;  Service: General;  Laterality: N/A;   CESAREAN SECTION     CHOLECYSTECTOMY     COLONOSCOPY  10/17/2010   normal    ESOPHAGOGASTRODUODENOSCOPY  10/17/2010   non-critical Schatzki's ring s/p dilation, patulous EG junction, small hiatal hernia, otherwise normal stomach, first and second portion of duodenum   ESOPHAGOGASTRODUODENOSCOPY N/A 10/26/2018   Procedure: ESOPHAGOGASTRODUODENOSCOPY (EGD);  Surgeon: Corbin Ade, MD;  Location: [facility] ENDO SUITE;  Service: Endoscopy;  Laterality: N/A;  9:15am   FOOT SURGERY Right    heel spur   HAND SURGERY Left    carpal tunnel   HERNIA REPAIR N/A    approx.    10 or 12 years ago   IR FLUORO GUIDE CV LINE LEFT  06/14/2020   IR FLUORO GUIDE CV LINE RIGHT  09/06/2020   IR US GUIDE VASC ACCESS RIGHT  09/06/2020   KNEE ARTHROSCOPY WITH MEDIAL MENISECTOMY  01/07/2012   Procedure: KNEE ARTHROSCOPY WITH MEDIAL MENISECTOMY;  Surgeon: Darreld McleanWayne Keeling, MD;  Location: [facility] ORS;  Service: Orthopedics;  Laterality: Right;   LAPAROTOMY N/A 12/04/2019   Procedure: EXPLORATORY LAPAROTOMY;  Surgeon: Franky MachoJenkins, Mark, MD;  Location: [facility] OR[facility];  Service: General;  Laterality: N/A;   LAPAROTOMY N/A 12/06/2019   Procedure: EXPLORATORY LAPAROTOMY;  Surgeon: Franky MachoJenkins, Mark, MD;  Location: [facility] OR[facility];  Service: General;  Laterality: N/A;   MALONEY DILATION  10/17/2010   Procedure: Alvy BealMALONEY DILATION;  Surgeon: Corbin Adeobert M Rourk, MD;  Location: [facility];  Service: Endoscopy;   Laterality: N/A;   MALONEY DILATION N/A 10/26/2018   Procedure: Elease HashimotoMALONEY DILATION;  Surgeon: Corbin Adeourk, Robert M, MD;  Location: [facility];  Service: Endoscopy;  Laterality: N/A;   PARTIAL COLECTOMY Right 12/04/2019   Procedure: RIGHT HEMI COLECTOMY WITH TERMINAL ILEUM RESECTION;  Surgeon: Franky MachoJenkins, Mark, MD;  Location: [facility];  Service: General;  Laterality: Right;   TOTAL KNEE ARTHROPLASTY Right 04/18/2019   Procedure: RIGHT TOTAL KNEE ARTHROPLASTY;  Surgeon: Vickki HearingHarrison, Stanley E, MD;  Location: [facility];  Service: Orthopedics;  Laterality: Right;   TRIGGER FINGER RELEASE Right 08/20/2016   Procedure: RELEASE A-1 PULLEY RIGHT LONG FINGER;  Surgeon: Vickki HearingHarrison, Stanley E, MD;  Location: [facility];  Service: Orthopedics;  Laterality: Right;     OB History   No obstetric history on file.     Family History  Problem Relation Age of Onset   Cancer Father        esophagus ca, living   Colon cancer Paternal Uncle        in 2160s, early 7670s   Colon polyps Neg Hx     Social History   Tobacco Use   Smoking status: Never   Smokeless tobacco: Never  Vaping Use   Vaping Use: Never used  Substance Use Topics   Alcohol use: Yes    Comment: drinks gin on weekends    Drug use: No    Home Medications Prior to Admission medications   Medication Sig Start Date End Date Taking? Authorizing Provider  HYDROcodone-acetaminophen (NORCO/VICODIN) 5-325 MG tablet Take 1 tablet by mouth every 4 (four) hours as needed for severe pain. 09/19/20 09/19/21 Yes Franky MachoJenkins, Mark, MD  pantoprazole (PROTONIX) 40 MG tablet Take 40 mg by mouth daily.   Yes [provider]  vancomycin (VANCOCIN) 1-5 GM/200ML-% SOLN Inject 200 mLs into the vein daily.  10/15/20 Yes [provider]  loperamide HCl (IMODIUM) 1 MG/7.5ML suspension Take 15 mLs (2 mg total) by mouth every 6 (six) hours as needed for diarrhea or loose stools. Patient not taking: Reported on 10/09/2020 05/21/20   Maurilio LovelyShah, Pratik D, DO    Allergies     5-alpha reductase inhibitors, Maxipime [cefepime], Metronidazole, and Acetaminophen-codeine  Review of Systems   Review of Systems  Constitutional:  Positive for chills and fatigue. Negative for appetite change.  HENT:  Negative for congestion.   Respiratory:  Negative for shortness of breath.   Cardiovascular:  Negative for chest pain.  Gastrointestinal:  Negative for abdominal pain.  Genitourinary:  Negative for flank pain.  Musculoskeletal:  Negative for back pain.  Neurological:  Negative for weakness.  Psychiatric/Behavioral:  Negative for confusion.    Physical Exam Updated Vital Signs BP 115/64   Pulse 83   Temp 98.8 F (37.1 C) (Oral)   Resp 16   Ht 5\' 1"  (1.549 m)   Wt 77.6 kg   SpO2 97%   BMI 32.31 kg/m  Physical Exam Vitals and nursing note reviewed.  HENT:     Head: Atraumatic.  Eyes:     Pupils: Pupils are equal, round, and reactive to light.  Cardiovascular:     Rate and Rhythm: Regular rhythm.  Pulmonary:     Effort: No respiratory distress.     Comments: Mild erythema to right chest wall PICC line. Abdominal:     Tenderness: There is abdominal tenderness.     Comments: Ileostomy right lower quadrant.  Clear wound dressing over midline chronic wound.  Musculoskeletal:        General: No tenderness.  Skin:    General: Skin is warm.     Capillary Refill: Capillary refill takes less than 2 seconds.  Neurological:     Mental Status: She is alert and oriented to person, place, and time.    ED Results / Procedures / Treatments   Labs (all labs ordered are listed, but only abnormal results are displayed) Labs Reviewed  COMPREHENSIVE METABOLIC PANEL - Abnormal; Notable for the following components:      Result Value   Glucose, Bld 121 (*)    BUN 25 (*)    Calcium 8.2 (*)    Albumin 2.6 (*)    Alkaline Phosphatase 370 (*)    Anion gap 2 (*)    All other components within normal limits  CBC WITH DIFFERENTIAL/PLATELET - Abnormal; Notable for the  following components:   RBC 3.73 (*)    Hemoglobin 10.2 (*)    HCT 32.8 (*)    Abs Immature Granulocytes 0.15 (*)    All other components within normal limits  CULTURE, BLOOD (ROUTINE X 2)  CULTURE, BLOOD (ROUTINE X 2)  MAGNESIUM  PHOSPHORUS  LACTIC ACID, PLASMA    EKG None  Radiology DG Chest Portable 1 View  Result Date: 10/09/2020 CLINICAL DATA:  Fever. EXAM: PORTABLE CHEST 1 VIEW COMPARISON:  IR fluoroscopy, 09/06/2020. Chest radiograph, 09/02/2020. FINDINGS: Right IJ CVC, well positioned, with tip at the superior cavo-atrial junction. Cardiac silhouette is within normal limits. The lungs are well inflated. No focal consolidation or mass. No pleural effusion pneumothorax. No acute osseous abnormality. IMPRESSION: 1. Right IJ CVC, well-positioned. 2. No acute cardiopulmonary findings. Electronically Signed   By: Roanna Banning M.D.   On: 10/09/2020 08:24    Procedures Procedures   Medications Ordered in ED Medications - No data to display  ED Course  I have reviewed the triage vital signs and the nursing notes.  Pertinent labs & imaging results that were available during my care of the patient were reviewed by me and considered in my medical decision making (see chart for details).    MDM Rules/Calculators/A&P                           Patient reportedly sent in by ID pharmacy.  Had white count was elevated as an outpatient.  Is on PICC line antibiotics for recurrent infections.  Initially did not come with the level of the white count that was drawn prehospital, however discussed with Dr. Lovell Sheehan who said it was up to 17.  States that is why she was started on antibiotics.  White count rechecked today and has normalized.  Patient is feeling better.  Still having some chills at time but overall improving.  Seen in the ER by Dr. Lovell Sheehan.  Will discharge home with outpatient follow-up.   Final Clinical Impression(s) / ED Diagnoses Final diagnoses:  Leukocytosis,  unspecified  type    Rx / DC Orders ED Discharge Orders     None        Benjiman Core, MD 10/09/20 [phone]

## 2020-10-14 LAB — CULTURE, BLOOD (ROUTINE X 2)
Culture: NO GROWTH
Culture: NO GROWTH
Special Requests: ADEQUATE
Special Requests: ADEQUATE

## 2020-10-24 ENCOUNTER — Other Ambulatory Visit: Payer: Self-pay | Admitting: General Surgery

## 2020-10-24 DIAGNOSIS — Z09 Encounter for follow-up examination after completed treatment for conditions other than malignant neoplasm: Secondary | ICD-10-CM

## 2020-10-24 MED ORDER — HYDROCODONE-ACETAMINOPHEN 5-325 MG PO TABS: 1.0000 | ORAL_TABLET | ORAL | 0 refills | Status: DC | PRN

## 2020-10-24 NOTE — Progress Notes (Unsigned)
Reordered Vicodin for pain from enterocutaneous fistula.

## 2020-11-07 ENCOUNTER — Ambulatory Visit (HOSPITAL_COMMUNITY)
Admission: RE | Admit: 2020-11-07 | Discharge: 2020-11-07 | Disposition: A | Discharge status: Home / Self Care | Payer: 59 | Source: Ambulatory Visit | Attending: Physician Assistant | Admitting: Physician Assistant

## 2020-11-07 ENCOUNTER — Other Ambulatory Visit: Payer: Self-pay

## 2020-11-07 DIAGNOSIS — R911 Solitary pulmonary nodule: Secondary | ICD-10-CM | POA: Diagnosis not present

## 2020-11-22 ENCOUNTER — Telehealth: Payer: Self-pay | Admitting: General Surgery

## 2020-11-22 NOTE — Telephone Encounter (Signed)
Appointment/Referral scheduled with Dr. Karsten Fells at [facility] for Thursday, December 05, 2020 at 1:00pm. S/W Para March who stated that she would mail new patient information to patient's home address. Patient has been notified of appointment date and time.

## 2020-11-28 ENCOUNTER — Other Ambulatory Visit (INDEPENDENT_AMBULATORY_CARE_PROVIDER_SITE_OTHER): Payer: 59 | Admitting: General Surgery

## 2020-11-28 DIAGNOSIS — Z09 Encounter for follow-up examination after completed treatment for conditions other than malignant neoplasm: Secondary | ICD-10-CM

## 2020-11-28 MED ORDER — HYDROCODONE-ACETAMINOPHEN 5-325 MG PO TABS: 1.0000 | ORAL_TABLET | ORAL | 0 refills | Status: DC | PRN

## 2020-11-28 NOTE — Progress Notes (Signed)
Reordered hydrocodone for pain due to enterocutaneous fistula.

## 2020-12-31 ENCOUNTER — Encounter: Payer: Self-pay | Admitting: General Surgery

## 2020-12-31 ENCOUNTER — Ambulatory Visit (INDEPENDENT_AMBULATORY_CARE_PROVIDER_SITE_OTHER): Payer: 59 | Admitting: General Surgery

## 2020-12-31 ENCOUNTER — Other Ambulatory Visit: Payer: Self-pay

## 2020-12-31 VITALS — BP 114/74 | HR 104 | Temp 98.6°F | Resp 18 | Ht 61.0 in | Wt 185.0 lb

## 2020-12-31 DIAGNOSIS — L03311 Cellulitis of abdominal wall: Secondary | ICD-10-CM | POA: Diagnosis not present

## 2020-12-31 MED ORDER — SULFAMETHOXAZOLE-TRIMETHOPRIM 800-160 MG PO TABS: 1.0000 | ORAL_TABLET | Freq: Two times a day (BID) | ORAL | 1 refills | Status: AC

## 2021-01-02 NOTE — Progress Notes (Signed)
Subjective:     [name]  Patient presents with a small wound just inferior to her ileostomy.  She states that developed last week.  She has had some purulent drainage from it.  It went from 1-3 punctate wounds.  She denies any fevers.  She has been seen by Dr. Barnetta Chapel of [facility] for possible closure of her enterocutaneous fistula. Objective:    BP 114/74   Pulse (!) 104   Temp 98.6 F (37 C) (Other (Comment))   Resp 18   Ht 5\' 1"  (1.549 m)   Wt 185 lb (83.9 kg)   SpO2 97%   BMI 34.96 kg/m   General:  alert, cooperative, and no distress  Abdomen: Just inferior to her ileostomy, a 1.5 cm ovoid area is noted several centimeters away.  There is some purulent drainage present.  No enteral contents seem to be emanating from this.  I was able to express more purulent fluid.  Mild induration and erythema noted.     Assessment:    Cellulitis of the abdominal wall.  A fistula is always a consideration, though no enteral contents are present.  She is getting a CT scan of the abdomen and pelvis soon.  This may help identify whether this is a fistula.    Plan:   I have started her on Bactrim DS 1 tablet p.o. twice daily for 1 week.  I told him to keep the wound clean and dry.  We will see what the CT scan shows.

## 2021-01-07 ENCOUNTER — Other Ambulatory Visit (INDEPENDENT_AMBULATORY_CARE_PROVIDER_SITE_OTHER): Payer: 59 | Admitting: General Surgery

## 2021-01-07 DIAGNOSIS — Z09 Encounter for follow-up examination after completed treatment for conditions other than malignant neoplasm: Secondary | ICD-10-CM

## 2021-01-07 MED ORDER — HYDROCODONE-ACETAMINOPHEN 5-325 MG PO TABS: 1.0000 | ORAL_TABLET | ORAL | 0 refills | Status: DC | PRN

## 2021-01-07 NOTE — Progress Notes (Signed)
Reordered norco for abdominal wall pain.

## 2021-01-10 ENCOUNTER — Other Ambulatory Visit (HOSPITAL_COMMUNITY): Payer: Self-pay | Admitting: Surgery

## 2021-01-10 ENCOUNTER — Other Ambulatory Visit: Payer: Self-pay | Admitting: Surgery

## 2021-01-10 DIAGNOSIS — K632 Fistula of intestine: Secondary | ICD-10-CM

## 2021-01-22 ENCOUNTER — Ambulatory Visit (HOSPITAL_BASED_OUTPATIENT_CLINIC_OR_DEPARTMENT_OTHER)
Admission: RE | Admit: 2021-01-22 | Discharge: 2021-01-22 | Disposition: A | Discharge status: Home / Self Care | Payer: 59 | Source: Ambulatory Visit | Attending: Surgery | Admitting: Surgery

## 2021-01-22 ENCOUNTER — Other Ambulatory Visit: Payer: Self-pay

## 2021-01-22 ENCOUNTER — Encounter (HOSPITAL_BASED_OUTPATIENT_CLINIC_OR_DEPARTMENT_OTHER): Payer: Self-pay

## 2021-01-22 DIAGNOSIS — K632 Fistula of intestine: Secondary | ICD-10-CM | POA: Diagnosis not present

## 2021-01-22 LAB — POCT I-STAT CREATININE: Creatinine, Ser: 0.5 mg/dL (ref 0.44–1.00)

## 2021-01-22 MED ORDER — IOHEXOL 300 MG/ML  SOLN
80.0000 mL | Freq: Once | INTRAMUSCULAR | Status: AC | PRN
  Administered 2021-01-22: 80 mL via INTRAVENOUS

## 2021-01-24 ENCOUNTER — Ambulatory Visit (HOSPITAL_COMMUNITY)
Admission: RE | Admit: 2021-01-24 | Discharge: 2021-01-24 | Disposition: A | Discharge status: Home / Self Care | Payer: 59 | Source: Ambulatory Visit | Attending: Surgery | Admitting: Surgery

## 2021-01-24 ENCOUNTER — Other Ambulatory Visit: Payer: Self-pay

## 2021-01-24 DIAGNOSIS — K632 Fistula of intestine: Secondary | ICD-10-CM | POA: Diagnosis not present

## 2021-02-12 ENCOUNTER — Telehealth: Payer: Self-pay | Admitting: *Deleted

## 2021-02-12 ENCOUNTER — Other Ambulatory Visit (INDEPENDENT_AMBULATORY_CARE_PROVIDER_SITE_OTHER): Payer: 59 | Admitting: General Surgery

## 2021-02-12 DIAGNOSIS — Z09 Encounter for follow-up examination after completed treatment for conditions other than malignant neoplasm: Secondary | ICD-10-CM

## 2021-02-12 MED ORDER — HYDROCODONE-ACETAMINOPHEN 5-325 MG PO TABS: 1.0000 | ORAL_TABLET | ORAL | 0 refills | Status: DC | PRN

## 2021-02-12 NOTE — Telephone Encounter (Signed)
Received call from patient.   Requested refill on Hydrocodone/APAP. Reports that she has not been seen by Dr. Charna Elizabeth at this time for reversal.   Message sent to provider.

## 2021-02-12 NOTE — Progress Notes (Signed)
Pain refill for enterocutaneous fistula.

## 2021-02-12 NOTE — Telephone Encounter (Signed)
Correction: Patient is to be seen by Karsten Fells, MD for enterocutaneous fistula.

## 2021-03-11 ENCOUNTER — Other Ambulatory Visit (INDEPENDENT_AMBULATORY_CARE_PROVIDER_SITE_OTHER): Payer: 59 | Admitting: *Deleted

## 2021-03-11 DIAGNOSIS — K632 Fistula of intestine: Secondary | ICD-10-CM

## 2021-03-11 MED ORDER — HYDROCODONE-ACETAMINOPHEN 5-325 MG PO TABS: 1.0000 | ORAL_TABLET | ORAL | 0 refills | Status: AC | PRN

## 2021-03-11 NOTE — Telephone Encounter (Signed)
[facility]  Patient of Dr. Lovell Sheehan on chronic pain meds. He is not available to refill. Patient is getting surgery with Dr. Barnetta Chapel in 2 weeks for closure of enterocutaneous fistula.   Will refill once and then she will be post op from Dr. Barnetta Chapel.  Algis Greenhouse, MD [facility] [address] [place], [place] [address] [phone] (office)

## 2021-03-11 NOTE — Telephone Encounter (Signed)
Received call from patient.   Requested refill on Hydrocodone/APAP. Reports that she is having surgery to repair enterocutaneous fistula by Karsten Fells, MD in 2 weeks.   Ok to refill?

## 2021-05-13 ENCOUNTER — Encounter (HOSPITAL_COMMUNITY): Payer: Self-pay | Admitting: Radiology

## 2021-06-02 ENCOUNTER — Telehealth: Payer: Self-pay | Admitting: *Deleted

## 2021-06-02 NOTE — Telephone Encounter (Signed)
Received call from Clifton Custard, Facilities manager with [facility] [phone] telephone.  ? ?Reports that patient had labs obtained with critical values noted as follows: ?Hgb 3.6 ?Hct 11.8 ? ?Dr. Lovell Sheehan made aware and advised to either have labs re-drawn STAT, or send to [facility] ER for follow up with Dr. Barnetta Chapel.  ? ?Call placed to [facility] and made aware of provider recommendations.  ? ?Also advised that Dr. Barnetta Chapel is currently taken over care as general surgeon and all new orders/ labs should be sent to Dr. Barnetta Chapel.  ?

## 2021-08-15 ENCOUNTER — Telehealth: Payer: Self-pay | Admitting: *Deleted

## 2021-10-22 ENCOUNTER — Other Ambulatory Visit (HOSPITAL_COMMUNITY): Payer: Self-pay | Admitting: Emergency Medicine

## 2021-10-22 ENCOUNTER — Other Ambulatory Visit: Payer: Self-pay | Admitting: Emergency Medicine

## 2021-10-22 DIAGNOSIS — R1900 Intra-abdominal and pelvic swelling, mass and lump, unspecified site: Secondary | ICD-10-CM

## 2021-10-31 ENCOUNTER — Ambulatory Visit (HOSPITAL_COMMUNITY)
Admission: RE | Admit: 2021-10-31 | Discharge: 2021-10-31 | Disposition: A | Discharge status: Home / Self Care | Payer: 59 | Source: Ambulatory Visit | Attending: Emergency Medicine | Admitting: Emergency Medicine

## 2021-10-31 ENCOUNTER — Encounter (HOSPITAL_COMMUNITY): Payer: Self-pay

## 2021-10-31 DIAGNOSIS — R1900 Intra-abdominal and pelvic swelling, mass and lump, unspecified site: Secondary | ICD-10-CM | POA: Insufficient documentation

## 2022-05-06 IMAGING — CT CT ABD-PELV W/ CM
2 of 5 series · 16 of 46 positions shown, 18 images · IV contrast (Omnipaque or Isovue)
Comparison: CT of the abdomen pelvis 02/28/2020 and 02/06/2020

CLINICAL DATA: Intra-abdominal abscess. Follow-up perforation of
colon. Drain in place.

EXAM:
CT ABDOMEN AND PELVIS WITH CONTRAST
TECHNIQUE: Multidetector CT imaging of the abdomen and pelvis was performed
using the standard protocol following bolus administration of
intravenous contrast.
CONTRAST:  100mL OMNIPAQUE IOHEXOL 300 MG/ML  SOLN

[Series 3: axial st · axial · 0.91mm/px · z∈[+863,+1238]mm · 13 of 85 slices shown, 15 images]
[im 5/85  soft-tissue]
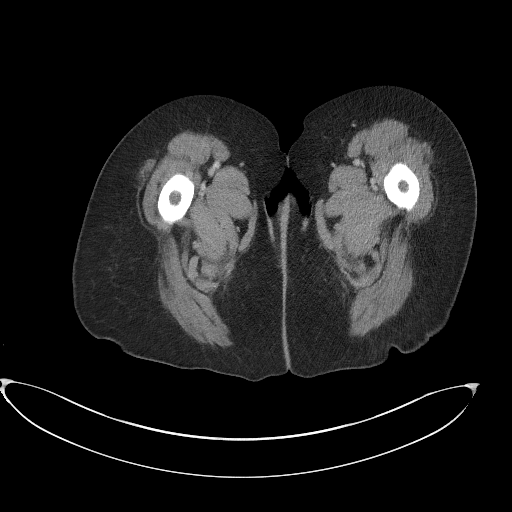
[im 5/85  bone]
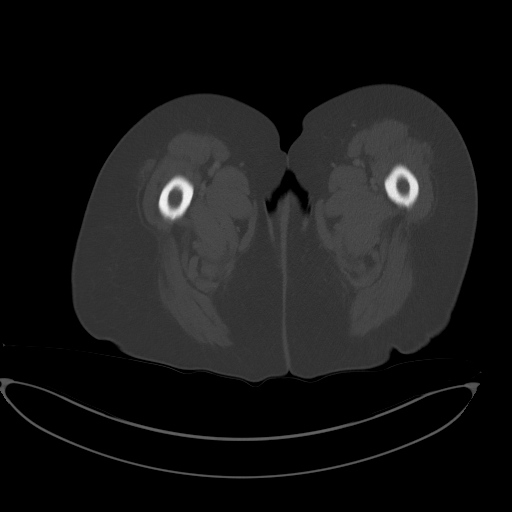
[im 10/85  soft-tissue]
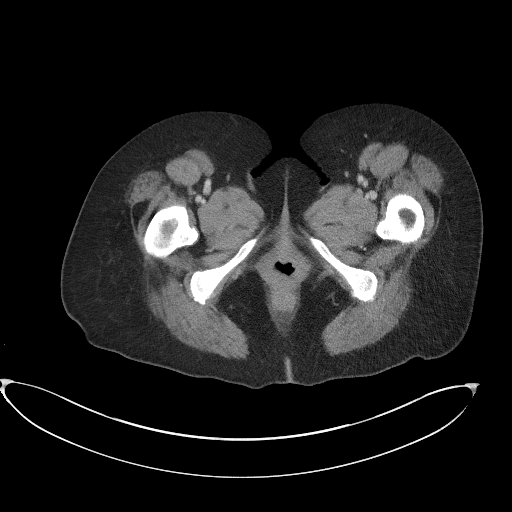
[im 20/85  soft-tissue]
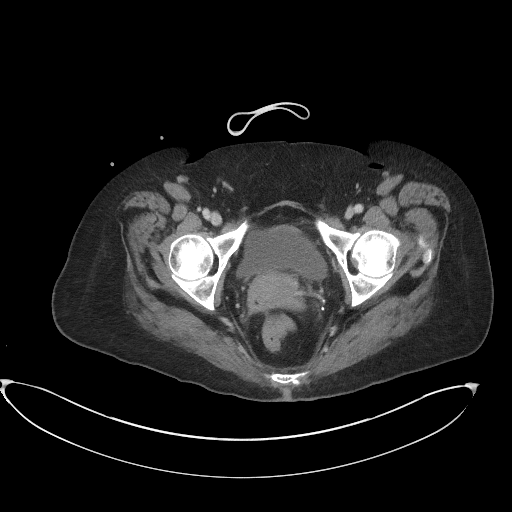
[im 25/85  soft-tissue]
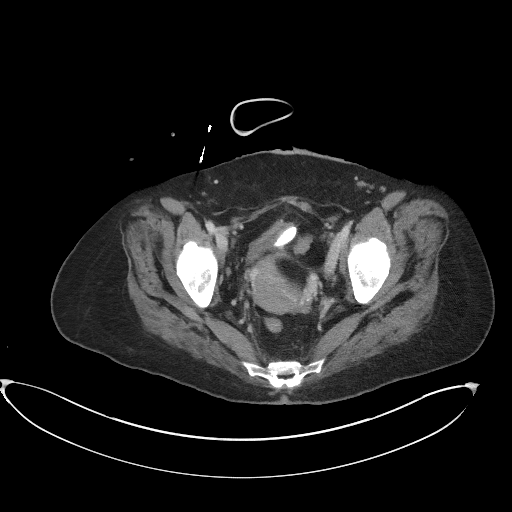
[im 30/85  soft-tissue]
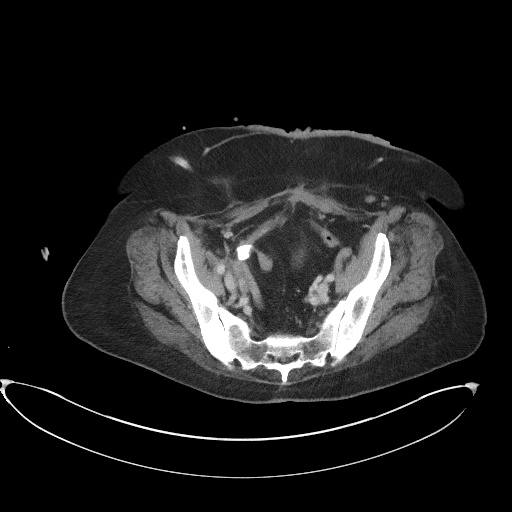
[im 35/85  soft-tissue]
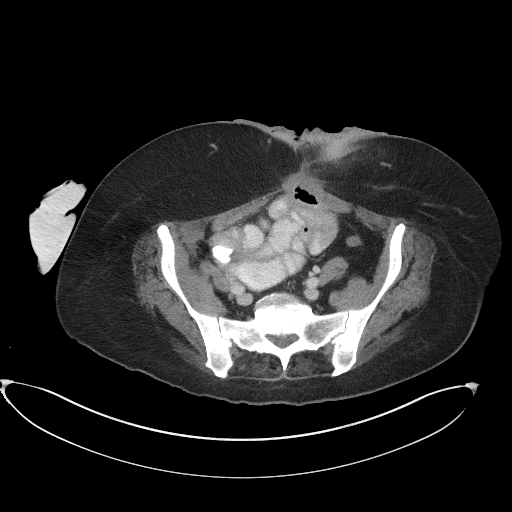
[im 45/85  soft-tissue]
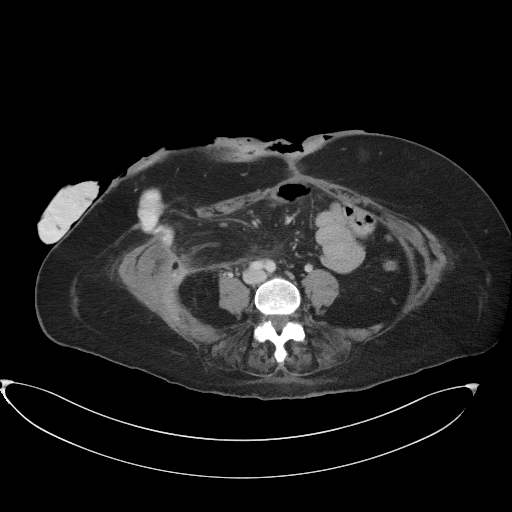
[im 50/85  soft-tissue]
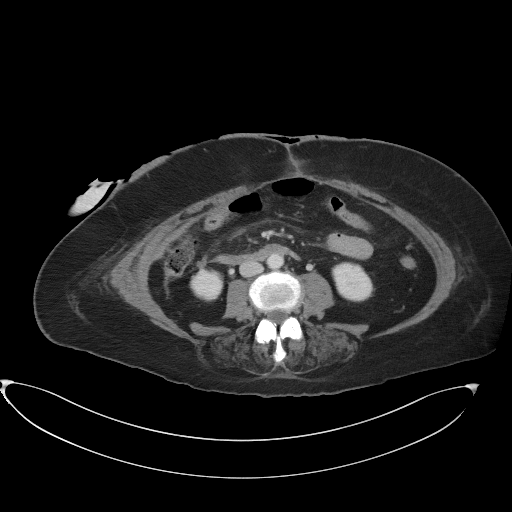
[im 55/85  soft-tissue]
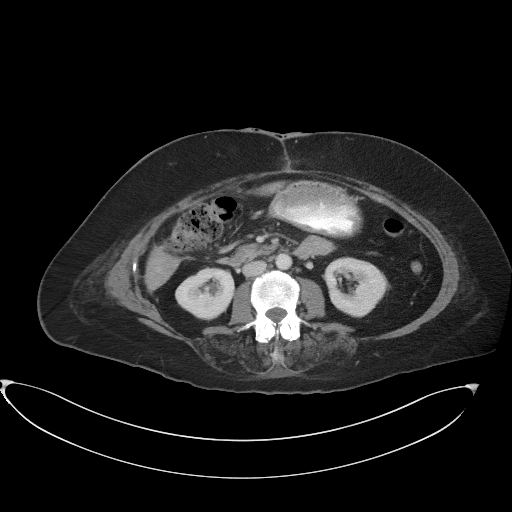
[im 55/85  bone]
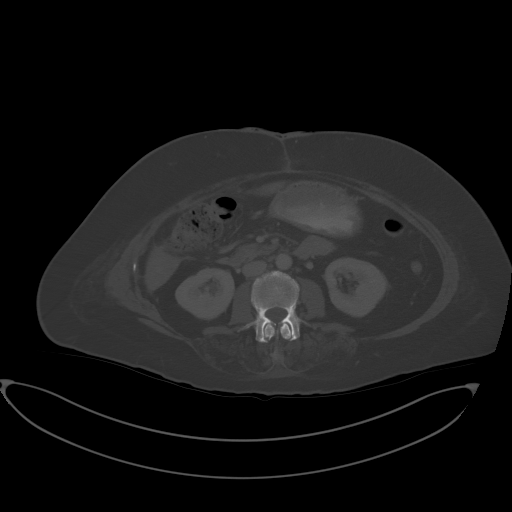
[im 60/85  soft-tissue]
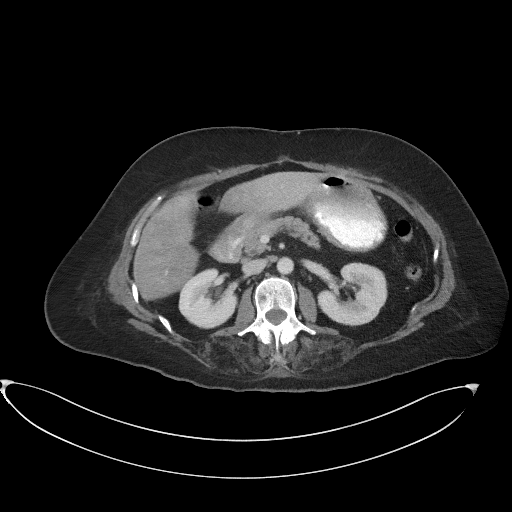
[im 65/85  soft-tissue]
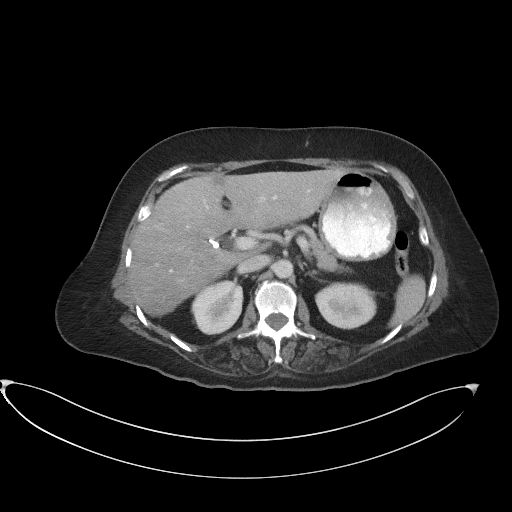
[im 75/85  soft-tissue]
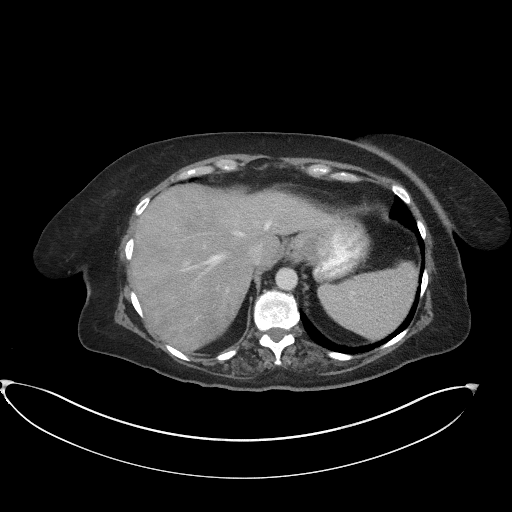
[im 80/85  soft-tissue]
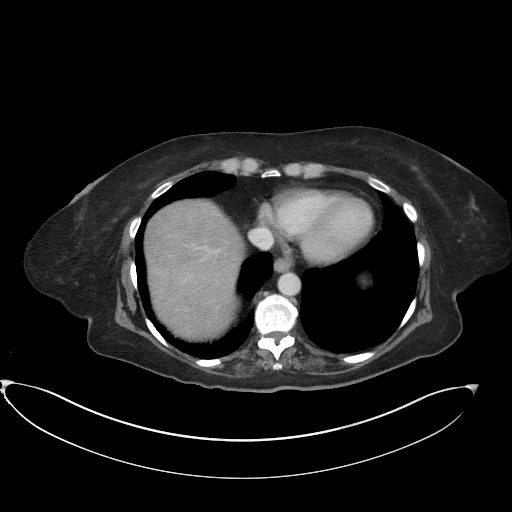

[Series 5: coronal st · coronal · 0.86mm/px · 3 of 97 slices shown]
[im 33/97  soft-tissue]
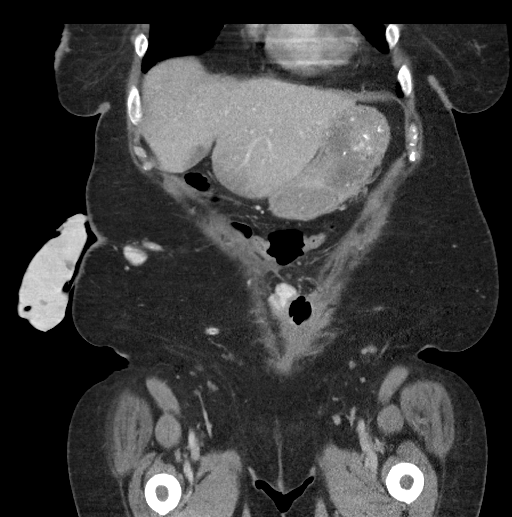
[im 43/97  soft-tissue]
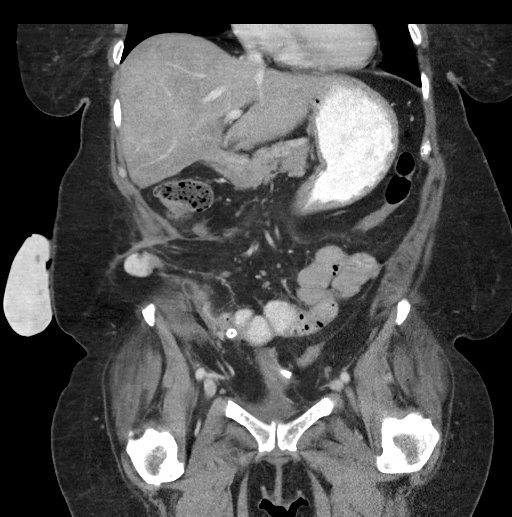
[im 54/97  soft-tissue]
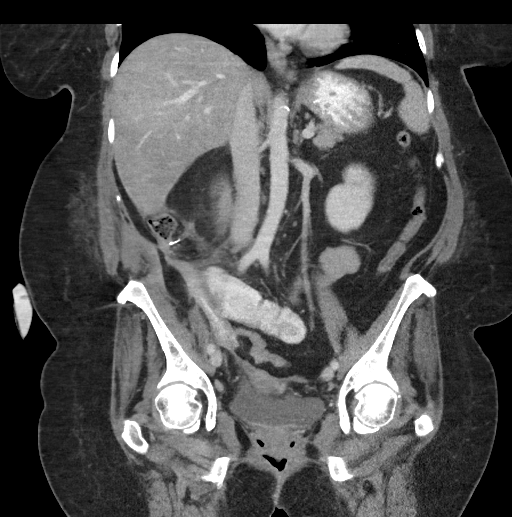

[16 of 46 positions shown; findings below may reference images not displayed]

FINDINGS: Lower chest: The lung bases are clear without focal nodule, mass, or
airspace disease. Heart size is normal.

Heterogeneous fatty infiltration of the liver again seen. No
discrete lesions present. Cholecystectomy noted. Common bile duct is
within normal limits.

Hepatobiliary: No focal liver abnormality is seen. No gallstones,
gallbladder wall thickening, or biliary dilatation.

Pancreas: Unremarkable. No pancreatic ductal dilatation or
surrounding inflammatory changes.

Spleen: Normal in size without focal abnormality.

Adrenals/Urinary Tract: Adrenal glands are normal bilaterally.
Kidneys and ureters are within normal limits. No stone or mass
lesion is present. No obstruction is present. Urinary bladder is
within normal limits.

Stomach/Bowel: Stomach and duodenum are within normal limits.
Proximal small bowel is unremarkable. Loops of small bowel are noted
against the anterior abdominal wall without obstruction. Ileostomy
is intact. Colon is unremarkable.

Vascular/Lymphatic: No significant vascular findings are present. No
enlarged abdominal or pelvic lymph nodes.

Reproductive: Uterus and bilateral adnexa are unremarkable.

Other: Ventral wound no longer has fluid within the wound. There is
no definite communication. Right lower quadrant drain is in place.
No residual fluid collection or abscess is present. No free air or
free fluid is present.

Musculoskeletal: Vertebral body heights and alignment are normal. No
focal lytic or blastic lesions are present. Degenerative changes are
noted the SI joints. Hips are located and within normal limits.
IMPRESSION: 1. Right lower quadrant drain is in place. No residual fluid
collection or abscess is present.
2. Ventral wound no longer has fluid within the wound. There is no
definite intraperitoneal communication.
3. Loops of small bowel are against the anterior abdominal wall
without obstruction. Adhesions are likely.
4. Heterogeneous fatty infiltration of the liver.
5. Cholecystectomy.

## 2022-06-16 ENCOUNTER — Other Ambulatory Visit (HOSPITAL_COMMUNITY): Payer: Self-pay | Admitting: Family Medicine

## 2022-06-16 DIAGNOSIS — R911 Solitary pulmonary nodule: Secondary | ICD-10-CM

## 2022-07-01 ENCOUNTER — Ambulatory Visit (HOSPITAL_COMMUNITY): Admission: RE | Admit: 2022-07-01 | Payer: Medicare HMO | Source: Ambulatory Visit

## 2022-07-01 ENCOUNTER — Encounter (HOSPITAL_COMMUNITY): Payer: Self-pay

## 2022-08-17 ENCOUNTER — Ambulatory Visit (HOSPITAL_COMMUNITY): Payer: Medicare HMO

## 2022-08-17 ENCOUNTER — Encounter (HOSPITAL_COMMUNITY): Payer: Self-pay

## 2022-10-01 ENCOUNTER — Ambulatory Visit (HOSPITAL_COMMUNITY)
Admission: RE | Admit: 2022-10-01 | Discharge: 2022-10-01 | Disposition: A | Discharge status: Home / Self Care | Payer: 59 | Source: Ambulatory Visit | Attending: Family Medicine | Admitting: Family Medicine

## 2022-10-01 DIAGNOSIS — R911 Solitary pulmonary nodule: Secondary | ICD-10-CM | POA: Insufficient documentation

## 2022-10-01 DIAGNOSIS — R918 Other nonspecific abnormal finding of lung field: Secondary | ICD-10-CM | POA: Diagnosis not present

## 2022-10-01 DIAGNOSIS — K449 Diaphragmatic hernia without obstruction or gangrene: Secondary | ICD-10-CM | POA: Diagnosis not present

## 2023-03-25 DIAGNOSIS — L659 Nonscarring hair loss, unspecified: Secondary | ICD-10-CM | POA: Diagnosis not present

## 2023-03-25 DIAGNOSIS — E6609 Other obesity due to excess calories: Secondary | ICD-10-CM | POA: Diagnosis not present

## 2023-03-25 DIAGNOSIS — R911 Solitary pulmonary nodule: Secondary | ICD-10-CM | POA: Diagnosis not present

## 2023-03-25 DIAGNOSIS — Z8249 Family history of ischemic heart disease and other diseases of the circulatory system: Secondary | ICD-10-CM | POA: Diagnosis not present

## 2023-03-25 DIAGNOSIS — Z6837 Body mass index (BMI) 37.0-37.9, adult: Secondary | ICD-10-CM | POA: Diagnosis not present

## 2023-03-25 DIAGNOSIS — D485 Neoplasm of uncertain behavior of skin: Secondary | ICD-10-CM | POA: Diagnosis not present

## 2023-03-25 DIAGNOSIS — Z0001 Encounter for general adult medical examination with abnormal findings: Secondary | ICD-10-CM | POA: Diagnosis not present

## 2023-03-25 DIAGNOSIS — K219 Gastro-esophageal reflux disease without esophagitis: Secondary | ICD-10-CM | POA: Diagnosis not present

## 2023-03-25 DIAGNOSIS — R600 Localized edema: Secondary | ICD-10-CM | POA: Diagnosis not present

## 2023-04-05 DIAGNOSIS — J1089 Influenza due to other identified influenza virus with other manifestations: Secondary | ICD-10-CM | POA: Diagnosis not present

## 2023-04-05 DIAGNOSIS — R051 Acute cough: Secondary | ICD-10-CM | POA: Diagnosis not present

## 2023-04-05 DIAGNOSIS — R509 Fever, unspecified: Secondary | ICD-10-CM | POA: Diagnosis not present

## 2023-04-29 DIAGNOSIS — H5203 Hypermetropia, bilateral: Secondary | ICD-10-CM | POA: Diagnosis not present

## 2023-04-29 DIAGNOSIS — H524 Presbyopia: Secondary | ICD-10-CM | POA: Diagnosis not present

## 2023-05-13 DIAGNOSIS — Z1231 Encounter for screening mammogram for malignant neoplasm of breast: Secondary | ICD-10-CM | POA: Diagnosis not present

## 2023-07-14 ENCOUNTER — Emergency Department (HOSPITAL_COMMUNITY)
Admission: EM | Admit: 2023-07-14 | Discharge: 2023-07-14 | Disposition: A | Discharge status: Home / Self Care | Attending: Emergency Medicine | Admitting: Emergency Medicine

## 2023-07-14 ENCOUNTER — Encounter (HOSPITAL_COMMUNITY): Payer: Self-pay

## 2023-07-14 ENCOUNTER — Other Ambulatory Visit: Payer: Self-pay

## 2023-07-14 DIAGNOSIS — R509 Fever, unspecified: Secondary | ICD-10-CM

## 2023-07-14 DIAGNOSIS — R112 Nausea with vomiting, unspecified: Secondary | ICD-10-CM | POA: Diagnosis not present

## 2023-07-14 DIAGNOSIS — R109 Unspecified abdominal pain: Secondary | ICD-10-CM | POA: Diagnosis not present

## 2023-07-14 DIAGNOSIS — R1031 Right lower quadrant pain: Secondary | ICD-10-CM | POA: Diagnosis not present

## 2023-07-14 LAB — CBC
HCT: 40.1 % (ref 36.0–46.0)
Hemoglobin: 12.6 g/dL (ref 12.0–15.0)
MCH: 28.1 pg (ref 26.0–34.0)
MCHC: 31.4 g/dL (ref 30.0–36.0)
MCV: 89.5 fL (ref 80.0–100.0)
Platelets: 179 10*3/uL (ref 150–400)
RBC: 4.48 MIL/uL (ref 3.87–5.11)
RDW: 13.7 % (ref 11.5–15.5)
WBC: 8.4 10*3/uL (ref 4.0–10.5)
nRBC: 0 % (ref 0.0–0.2)

## 2023-07-14 LAB — URINALYSIS, ROUTINE W REFLEX MICROSCOPIC
Bacteria, UA: NONE SEEN
Bilirubin Urine: NEGATIVE
Glucose, UA: NEGATIVE mg/dL
Hgb urine dipstick: NEGATIVE
Ketones, ur: 5 mg/dL — AB
Leukocytes,Ua: NEGATIVE
Nitrite: NEGATIVE
Protein, ur: 100 mg/dL — AB
Specific Gravity, Urine: 1.021 (ref 1.005–1.030)
pH: 5 (ref 5.0–8.0)

## 2023-07-14 LAB — COMPREHENSIVE METABOLIC PANEL WITH GFR
ALT: 21 U/L (ref 0–44)
AST: 26 U/L (ref 15–41)
Albumin: 3.5 g/dL (ref 3.5–5.0)
Alkaline Phosphatase: 129 U/L — ABNORMAL HIGH (ref 38–126)
Anion gap: 7 (ref 5–15)
BUN: 12 mg/dL (ref 8–23)
CO2: 24 mmol/L (ref 22–32)
Calcium: 8.6 mg/dL — ABNORMAL LOW (ref 8.9–10.3)
Chloride: 103 mmol/L (ref 98–111)
Creatinine, Ser: 0.6 mg/dL (ref 0.44–1.00)
GFR, Estimated: >60 mL/min (ref >60)
Glucose, Bld: 106 mg/dL — ABNORMAL HIGH (ref 70–99)
Potassium: 3.1 mmol/L — ABNORMAL LOW (ref 3.5–5.1)
Sodium: 134 mmol/L — ABNORMAL LOW (ref 135–145)
Total Bilirubin: 1.1 mg/dL (ref 0.0–1.2)
Total Protein: 7.8 g/dL (ref 6.5–8.1)

## 2023-07-14 LAB — LIPASE, BLOOD: Lipase: 26 U/L (ref 11–51)

## 2023-07-14 MED ORDER — ONDANSETRON 4 MG PO TBDP: 4.0000 mg | ORAL_TABLET | Freq: Three times a day (TID) | ORAL | 0 refills | Status: AC | PRN

## 2023-07-14 NOTE — ED Triage Notes (Signed)
 Pt arrived via POV c/o a fever 101F last night and an episode of emesis last night. Pt reports going to UC prior to arrival and being advised to seek further evaluation in the ER due to Pt reporting past GI problems. Pt reports UC did not perform any tests.

## 2023-07-14 NOTE — ED Provider Notes (Signed)
  EMERGENCY DEPARTMENT AT [facility] Provider Note   CSN: [id number] Arrival date & time: 07/14/23  1729     History  Chief Complaint  Patient presents with   Fever    [name] is a 63 y.o. female.  63 year old female with a history of bowel perforation that required multiple surgeries including appendectomy and bowel resection who presents emergency department with abdominal pain and nausea and vomiting and fever.  Patient reports that last night she felt like she had a fever and had 2 episodes of vomiting.  Says that this morning she felt better but still is mildly nauseous.  Went to urgent care who referred her to the emergency department for evaluation.  Says that her thermometer is broken and was unable to take her temperature at home.  Does also have chronic diarrhea and has 10-15 loose stools per day which is normal for her.  No changes to her stooling.       Home Medications Prior to Admission medications   Medication Sig Start Date End Date Taking? Authorizing Provider  ondansetron  (ZOFRAN -ODT) 4 MG disintegrating tablet Take 1 tablet (4 mg total) by mouth every 8 (eight) hours as needed for nausea or vomiting. 07/14/23  Yes Ninetta Basket, MD  pantoprazole  (PROTONIX ) 40 MG tablet Take 40 mg by mouth daily.    [provider]  sulfamethoxazole -trimethoprim  (BACTRIM  DS) 800-160 MG tablet Take 1 tablet by mouth 2 (two) times daily. 12/31/20   Alanda Allegra, MD      Allergies    5-alpha reductase inhibitors, Maxipime  [cefepime ], Metronidazole , and Acetaminophen -codeine    Review of Systems   Review of Systems  Physical Exam Updated Vital Signs BP 133/62   Pulse 86   Temp 98 F (36.7 C) (Oral)   Resp 16   Ht 5\' 1"  (1.549 m)   Wt 83.9 kg   SpO2 98%   BMI 34.95 kg/m  Physical Exam Vitals and nursing note reviewed.  Constitutional:      General: She is not in acute distress.    Appearance: She is well-developed.  HENT:      Head: Normocephalic and atraumatic.     Right Ear: External ear normal.     Left Ear: External ear normal.     Nose: Nose normal.  Eyes:     Extraocular Movements: Extraocular movements intact.     Conjunctiva/sclera: Conjunctivae normal.     Pupils: Pupils are equal, round, and reactive to light.  Cardiovascular:     Rate and Rhythm: Normal rate and regular rhythm.     Heart sounds: No murmur heard. Pulmonary:     Effort: Pulmonary effort is normal. No respiratory distress.     Breath sounds: Normal breath sounds.  Abdominal:     General: Abdomen is flat. There is no distension.     Palpations: Abdomen is soft. There is no mass.     Tenderness: There is abdominal tenderness (Mild right lower quadrant). There is no guarding.  Musculoskeletal:     Cervical back: Normal range of motion and neck supple.  Skin:    General: Skin is warm and dry.  Neurological:     Mental Status: She is alert and oriented to person, place, and time. Mental status is at baseline.  Psychiatric:        Mood and Affect: Mood normal.     ED Results / Procedures / Treatments   Labs (all labs ordered are listed, but only abnormal  results are displayed) Labs Reviewed  COMPREHENSIVE METABOLIC PANEL WITH GFR - Abnormal; Notable for the following components:      Result Value   Sodium 134 (*)    Potassium 3.1 (*)    Glucose, Bld 106 (*)    Calcium 8.6 (*)    Alkaline Phosphatase 129 (*)    All other components within normal limits  URINALYSIS, ROUTINE W REFLEX MICROSCOPIC - Abnormal; Notable for the following components:   Ketones, ur 5 (*)    Protein, ur 100 (*)    All other components within normal limits  LIPASE, BLOOD  CBC    EKG None  Radiology No results found.  Procedures Procedures    Medications Ordered in ED Medications - No data to display  ED Course/ Medical Decision Making/ A&P                                 Medical Decision Making Amount and/or Complexity of Data  Reviewed Labs: ordered.  Risk Prescription drug management.   [name] is a 63 y.o. female with comorbidities that complicate the patient evaluation including bowel perforation that required multiple surgeries including appendectomy and bowel resection who presents emergency department with abdominal pain and nausea and vomiting and fever.    Initial Ddx:  Gastroenteritis, colitis, infectious diarrhea, bowel perforation, obstruction  MDM/Course:  Patient presents emergency department with abdominal pain and nausea and vomiting.  Did report subjective fever last night.  In the triage note they are mentioning that her temperature was 101F but her and her husband reported that they do not recall her ever having an objective fever.  Does have a history of several intra-abdominal abscess processes and infections that have required surgery so she was concerned about this but reports that her symptoms have almost totally resolved aside from some mild nausea.  Does have some lower abdominal tenderness to palpation but no rebound or guarding.  I recommended the patient have a CT scan today especially with her history of abscesses in other infections but she declined.  Also declined to give a stool sample since she felt like she did not have to use restroom at this time.  Does have capacity make decision and will have her follow-up with her primary doctor.  Strict return precautions discussed prior to discharge.    This patient presents to the ED for concern of complaints listed in HPI, this involves an extensive number of treatment options, and is a complaint that carries with it a high risk of complications and morbidity. Disposition including potential need for admission considered.   Dispo: DC Home. Return precautions discussed including, but not limited to, those listed in the AVS. Allowed pt time to ask questions which were answered fully prior to dc.  Additional history obtained from  spouse Records reviewed Outpatient Clinic Notes The following labs were independently interpreted: CBC and show no acute abnormality I have reviewed the patients home medications and made adjustments as needed  Portions of this note were generated with Dragon dictation software. Dictation errors may occur despite best attempts at proofreading.     Final Clinical Impression(s) / ED Diagnoses Final diagnoses:  Abdominal pain, unspecified abdominal location  Fever, unspecified fever cause  Nausea and vomiting, unspecified vomiting type    Rx / DC Orders ED Discharge Orders          Ordered    ondansetron  (ZOFRAN -ODT) 4 MG  disintegrating tablet  Every 8 hours PRN        07/14/23 2236              Ninetta Basket, MD 07/15/23 0140

## 2023-07-14 NOTE — Discharge Instructions (Signed)
 You were seen for your fever and vomiting in the emergency department.  At home, please take the Zofran  for your nausea and vomiting. Please be sure to stay well-hydrated.  Follow-up with your primary doctor in 2-3 days regarding your visit.  Return immediately to the emergency department if you experience any of the following: fainting, abdominal pain, high fevers, or any other concerning symptoms.  Thank you for visiting our Emergency Department. It was a pleasure taking care of you today.

## 2023-07-14 NOTE — ED Notes (Signed)
Unable to obtain IV access. Charge nurse notified.

## 2023-07-14 NOTE — ED Triage Notes (Signed)
 Pt reports having frequent BM's, very watery, apprx 10-15 BM's per day. Pt endorses right abdominal pain and back pain.

## 2023-10-29 DIAGNOSIS — Z6837 Body mass index (BMI) 37.0-37.9, adult: Secondary | ICD-10-CM | POA: Diagnosis not present

## 2023-10-29 DIAGNOSIS — M1712 Unilateral primary osteoarthritis, left knee: Secondary | ICD-10-CM | POA: Diagnosis not present

## 2023-10-29 DIAGNOSIS — E6609 Other obesity due to excess calories: Secondary | ICD-10-CM | POA: Diagnosis not present

## 2023-11-24 ENCOUNTER — Other Ambulatory Visit (INDEPENDENT_AMBULATORY_CARE_PROVIDER_SITE_OTHER): Payer: Self-pay

## 2023-11-24 ENCOUNTER — Encounter: Payer: Self-pay | Admitting: Orthopedic Surgery

## 2023-11-24 ENCOUNTER — Other Ambulatory Visit: Payer: Self-pay

## 2023-11-24 ENCOUNTER — Ambulatory Visit (INDEPENDENT_AMBULATORY_CARE_PROVIDER_SITE_OTHER): Admitting: Orthopedic Surgery

## 2023-11-24 VITALS — BP 142/88 | HR 84 | Ht 61.0 in | Wt 190.0 lb

## 2023-11-24 DIAGNOSIS — G8929 Other chronic pain: Secondary | ICD-10-CM

## 2023-11-24 DIAGNOSIS — M1712 Unilateral primary osteoarthritis, left knee: Secondary | ICD-10-CM | POA: Diagnosis not present

## 2023-11-24 DIAGNOSIS — M7052 Other bursitis of knee, left knee: Secondary | ICD-10-CM

## 2023-11-24 DIAGNOSIS — M545 Low back pain, unspecified: Secondary | ICD-10-CM

## 2023-11-24 DIAGNOSIS — M51362 Other intervertebral disc degeneration, lumbar region with discogenic back pain and lower extremity pain: Secondary | ICD-10-CM | POA: Diagnosis not present

## 2023-11-24 MED ORDER — METHYLPREDNISOLONE ACETATE 40 MG/ML IJ SUSP
40.0000 mg | Freq: Once | INTRAMUSCULAR | Status: AC
  Administered 2023-11-24: 40 mg via INTRA_ARTICULAR

## 2023-11-24 NOTE — Progress Notes (Signed)
 Chief Complaint  Patient presents with   Knee Pain    Left/ for years worse in past couple months    63 year old female with left leg pain.  The patient had a successful right total knee several years ago but since then has had multiple medical problems including a 48-month hospital stay.  Comes in today complains of pain in her left leg numbness in her left toe  Review of systems right knee doing well  Patient planes of chronic fatigue  Past Medical History:  Diagnosis Date   Anxiety    Arthritis    Colon perforation (HCC)    due to appendicitis   GERD (gastroesophageal reflux disease)    Hypertension    S/P endoscopy    2008: noncritical Schatzki ring s/p dilation, normal esophagus and stomach, 2003: normal esophagus, s/p Maloney dilation, multiple antral erosions consistent with chronic gastritis, no H.pylori,    Past Surgical History:  Procedure Laterality Date   APPENDECTOMY     BOWEL RESECTION N/A 12/06/2019   Procedure: PARTIAL SMALL BOWEL RESECTION WITH ILEOSTOMY;  Surgeon: Mavis Anes, MD;  Location: [facility];  Service: General;  Laterality: N/A;   CENTRAL LINE INSERTION N/A 12/06/2019   Procedure: CENTRAL LINE INSERTION;  Surgeon: Mavis Anes, MD;  Location: [facility];  Service: General;  Laterality: N/A;   CESAREAN SECTION     CHOLECYSTECTOMY     COLONOSCOPY  10/17/2010   normal    ESOPHAGOGASTRODUODENOSCOPY  10/17/2010   non-critical Schatzki's ring s/p dilation, patulous EG junction, small hiatal hernia, otherwise normal stomach, first and second portion of duodenum   ESOPHAGOGASTRODUODENOSCOPY N/A 10/26/2018   Procedure: ESOPHAGOGASTRODUODENOSCOPY (EGD);  Surgeon: Shaaron Lamar HERO, MD;  [facility]: [facility];  Service: Endoscopy;  Laterality: N/A;  9:15am   FOOT SURGERY Right    heel spur   HAND SURGERY Left    carpal tunnel   HERNIA REPAIR N/A    approx.    10 or 12 years ago   IR FLUORO GUIDE CV LINE LEFT  06/14/2020   IR FLUORO GUIDE CV LINE RIGHT   09/06/2020   IR US  GUIDE VASC ACCESS RIGHT  09/06/2020   KNEE ARTHROSCOPY WITH MEDIAL MENISECTOMY  01/07/2012   Procedure: KNEE ARTHROSCOPY WITH MEDIAL MENISECTOMY;  Surgeon: Lemond Stable, MD;  Location: [facility];  Service: Orthopedics;  Laterality: Right;   LAPAROTOMY N/A 12/04/2019   Procedure: EXPLORATORY LAPAROTOMY;  Surgeon: Mavis Anes, MD;  Location: [facility];  Service: General;  Laterality: N/A;   LAPAROTOMY N/A 12/06/2019   Procedure: EXPLORATORY LAPAROTOMY;  Surgeon: Mavis Anes, MD;  Location: [facility];  Service: General;  Laterality: N/A;   MALONEY DILATION  10/17/2010   Procedure: AGAPITO HODGKIN;  Surgeon: Lamar HERO Shaaron, MD;  [facility];  Service: Endoscopy;  Laterality: N/A;   MALONEY DILATION N/A 10/26/2018   Procedure: AGAPITO DILATION;  Surgeon: Shaaron Lamar HERO, MD;  [facility];  Service: Endoscopy;  Laterality: N/A;   PARTIAL COLECTOMY Right 12/04/2019   Procedure: RIGHT HEMI COLECTOMY WITH TERMINAL ILEUM RESECTION;  Surgeon: Mavis Anes, MD;  Location: [facility];  Service: General;  Laterality: Right;   TOTAL KNEE ARTHROPLASTY Right 04/18/2019   Procedure: RIGHT TOTAL KNEE ARTHROPLASTY;  Surgeon: Margrette Taft BRAVO, MD;  Location: [facility];  Service: Orthopedics;  Laterality: Right;   TRIGGER FINGER RELEASE Right 08/20/2016   Procedure: RELEASE A-1 PULLEY RIGHT LONG FINGER;  Surgeon: Margrette Taft BRAVO, MD;  Location: [facility];  Service: Orthopedics;  Laterality: Right;   BP (!) 142/88   Pulse 84   Ht 5' 1 (1.549 m)   Wt 190 lb (86.2 kg)   BMI 35.90 kg/m   Normal development body habitus grooming no deformities  Good pulses distally  Skin warm dry intact all 4 extremities  Sensation C appears to be intact she is oriented x 3 mood and affect are normal  Gait no abnormalities per se  Inspection of the left lower extremity and back  Tenderness in the lower back decree sensation in the small toe tenderness along the lower leg as well as the  medial side of the knee including the pes tendons there is swelling of the bursa as well as insertional tenderness  DG Lumbar Spine 2-3 Views Result Date: 11/24/2023 X-ray report Chief complaint leg ache and small toe numb Images L spine 3 views Reading: slight coronal plane mal alignment End plate spurs Mild joint sp narrowing Mild facet arthritis Impression: mild to mod DDD   DG Knee AP/LAT W/Sunrise Left Result Date: 11/24/2023 X-ray report Chief complaint left knee pain Images 3 views Reading: varus alignment Joint sp narrowing medially with mod to severe osteophytes Impression: grade 3 OA    Impression  Encounter Diagnoses  Name Primary?   Chronic pain of left knee Yes   Lumbar pain    Pes anserinus bursitis of left knee    Degeneration of intervertebral disc of lumbar region with discogenic back pain and lower extremity pain    Recommend physical therapy for the discogenic back pain  Injection for the pes bursa  Procedure note Left knee injection for bursitis   verbal consent was obtained to inject Left knee PES BURSA  Timeout was completed to confirm the site of injection  The medications used were 40 mg of Depo-Medrol  and 1% lidocaine  3 cc  Anesthesia was provided by ethyl chloride and the skin was prepped with alcohol.  After cleaning the skin with alcohol a 25-gauge needle was used to inject the left knee bursa   There were no complications and a sterile bandage was applied  Recheck 6 weeks

## 2023-11-24 NOTE — Progress Notes (Signed)
  Intake history:  Chief Complaint  Patient presents with   Knee Pain    Left/ for years worse in past couple months     BP (!) 142/88   Pulse 84   Ht 5' 1 (1.549 m)   Wt 190 lb (86.2 kg)   BMI 35.90 kg/m  Body mass index is 35.9 kg/m.  Pharmacy? _____[facility] _________________________________  WHAT ARE WE SEEING YOU FOR TODAY?   Left knee  How long has this bothered you? (DOI?DOS?WS?)  Years worse in past couple months  Was there an injury? No  Anticoag.  No  Diabetes No  Heart disease No  Hypertension No  SMOKING HX No  Kidney disease     Latest Ref Rng & Units 07/14/2023    6:56 PM 01/22/2021    8:13 AM 10/09/2020    8:22 AM  CMP  Glucose 70 - 99 mg/dL 893   878   BUN 8 - 23 mg/dL 12   25   Creatinine 9.55 - 1.00 mg/dL 9.39  9.49  9.45   Sodium 135 - 145 mmol/L 134   135   Potassium 3.5 - 5.1 mmol/L 3.1   4.8   Chloride 98 - 111 mmol/L 103   105   CO2 22 - 32 mmol/L 24   28   Calcium  8.9 - 10.3 mg/dL 8.6   8.2   Total Protein 6.5 - 8.1 g/dL 7.8   7.6   Total Bilirubin 0.0 - 1.2 mg/dL 1.1   0.4   Alkaline Phos 38 - 126 U/L 129   370   AST 15 - 41 U/L 26   25   ALT 0 - 44 U/L 21   27      Any ALLERGIES ______________________________________________   Treatment:  Have you taken:  Tylenol  Yes  Advil No/ Aleve Yes  Had PT No  Had injection No  Other  _________________________

## 2023-11-24 NOTE — Patient Instructions (Signed)
 You have received an injection of steroids into the joint. 15% of patients will have increased pain within the 24 hours postinjection.   This is transient and will go away.   We recommend that you use ice packs on the injection site for 20 minutes every 2 hours and extra strength Tylenol 2 tablets every 8 as needed until the pain resolves.  If you continue to have pain after taking the Tylenol and using the ice please call the office for further instructions.

## 2023-11-25 ENCOUNTER — Telehealth: Payer: Self-pay | Admitting: Physical Therapy

## 2023-11-29 ENCOUNTER — Encounter: Payer: Self-pay | Admitting: Physical Therapy

## 2023-11-29 ENCOUNTER — Ambulatory Visit: Admitting: Physical Therapy

## 2023-11-29 ENCOUNTER — Ambulatory Visit: Attending: Orthopedic Surgery | Admitting: Physical Therapy

## 2023-11-29 DIAGNOSIS — M79662 Pain in left lower leg: Secondary | ICD-10-CM | POA: Insufficient documentation

## 2023-11-29 DIAGNOSIS — M5459 Other low back pain: Secondary | ICD-10-CM | POA: Insufficient documentation

## 2023-11-29 DIAGNOSIS — R262 Difficulty in walking, not elsewhere classified: Secondary | ICD-10-CM | POA: Diagnosis present

## 2023-11-29 DIAGNOSIS — M545 Low back pain, unspecified: Secondary | ICD-10-CM | POA: Insufficient documentation

## 2023-11-29 DIAGNOSIS — M6281 Muscle weakness (generalized): Secondary | ICD-10-CM | POA: Insufficient documentation

## 2023-11-29 DIAGNOSIS — M51362 Other intervertebral disc degeneration, lumbar region with discogenic back pain and lower extremity pain: Secondary | ICD-10-CM | POA: Diagnosis not present

## 2023-11-29 NOTE — Therapy (Signed)
 OUTPATIENT PHYSICAL THERAPY EVALUATION   Patient Name: [name] MRN: [id number] DOB:Nov 23, 1960, 63 y.o., female Today's Date: 11/29/2023  END OF SESSION:  PT End of Session - 11/29/23 1145     Visit Number 1    Number of Visits 12    Date for Recertification  01/10/24    Authorization Type [employer]    PT Start Time 1055    PT Stop Time 1135    PT Time Calculation (min) 40 min    Activity Tolerance Patient tolerated treatment well    Behavior During Therapy WFL for tasks assessed/performed          Past Medical History:  Diagnosis Date   Anxiety    Arthritis    Colon perforation (HCC)    due to appendicitis   GERD (gastroesophageal reflux disease)    Hypertension    S/P endoscopy    2008: noncritical Schatzki ring s/p dilation, normal esophagus and stomach, 2003: normal esophagus, s/p Maloney dilation, multiple antral erosions consistent with chronic gastritis, no H.pylori,    Past Surgical History:  Procedure Laterality Date   APPENDECTOMY     BOWEL RESECTION N/A 12/06/2019   Procedure: PARTIAL SMALL BOWEL RESECTION WITH ILEOSTOMY;  Surgeon: Mavis Anes, MD;  Location: [facility];  Service: General;  Laterality: N/A;   CENTRAL LINE INSERTION N/A 12/06/2019   Procedure: CENTRAL LINE INSERTION;  Surgeon: Mavis Anes, MD;  Location: [facility];  Service: General;  Laterality: N/A;   CESAREAN SECTION     CHOLECYSTECTOMY     COLONOSCOPY  10/17/2010   normal    ESOPHAGOGASTRODUODENOSCOPY  10/17/2010   non-critical Schatzki's ring s/p dilation, patulous EG junction, small hiatal hernia, otherwise normal stomach, first and second portion of duodenum   ESOPHAGOGASTRODUODENOSCOPY N/A 10/26/2018   Procedure: ESOPHAGOGASTRODUODENOSCOPY (EGD);  Surgeon: Shaaron Lamar HERO, MD;  [facility];  Service: Endoscopy;  Laterality: N/A;  9:15am   FOOT SURGERY Right    heel spur   HAND SURGERY Left    carpal tunnel   HERNIA REPAIR N/A    approx.    10 or 12 years ago   IR  FLUORO GUIDE CV LINE LEFT  06/14/2020   IR FLUORO GUIDE CV LINE RIGHT  09/06/2020   IR US  GUIDE VASC ACCESS RIGHT  09/06/2020   KNEE ARTHROSCOPY WITH MEDIAL MENISECTOMY  01/07/2012   Procedure: KNEE ARTHROSCOPY WITH MEDIAL MENISECTOMY;  Surgeon: Lemond Stable, MD;  Location: [facility];  Service: Orthopedics;  Laterality: Right;   LAPAROTOMY N/A 12/04/2019   Procedure: EXPLORATORY LAPAROTOMY;  Surgeon: Mavis Anes, MD;  Location: [facility];  Service: General;  Laterality: N/A;   LAPAROTOMY N/A 12/06/2019   Procedure: EXPLORATORY LAPAROTOMY;  Surgeon: Mavis Anes, MD;  Location: [facility];  Service: General;  Laterality: N/A;   MALONEY DILATION  10/17/2010   Procedure: AGAPITO HODGKIN;  Surgeon: Lamar HERO Shaaron, MD;  [facility];  Service: Endoscopy;  Laterality: N/A;   MALONEY DILATION N/A 10/26/2018   Procedure: AGAPITO DILATION;  Surgeon: Shaaron Lamar HERO, MD;  [facility];  Service: Endoscopy;  Laterality: N/A;   PARTIAL COLECTOMY Right 12/04/2019   Procedure: RIGHT HEMI COLECTOMY WITH TERMINAL ILEUM RESECTION;  Surgeon: Mavis Anes, MD;  Location: [facility];  Service: General;  Laterality: Right;   TOTAL KNEE ARTHROPLASTY Right 04/18/2019   Procedure: RIGHT TOTAL KNEE ARTHROPLASTY;  Surgeon: Margrette Taft BRAVO, MD;  Location: [facility];  Service: Orthopedics;  Laterality: Right;   TRIGGER FINGER RELEASE  Right 08/20/2016   Procedure: RELEASE A-1 PULLEY RIGHT LONG FINGER;  Surgeon: Margrette Taft BRAVO, MD;  Location: [facility];  Service: Orthopedics;  Laterality: Right;   Patient Active Problem List   Diagnosis Date Noted   Hyponatremia 09/02/2020   AKI (acute kidney injury) 05/14/2020   Lactic acidosis 05/14/2020   Dehydration 05/14/2020   PICC line infection, initial encounter    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   Small bowel perforation (HCC)    S/P partial colectomy 12/04/2019   Peritonitis due to abscess [facility])    Colon perforation (HCC)    S/P total knee replacement, right  04/18/2019   Primary osteoarthritis of right knee    Chest pain 03/02/2018   Acquired trigger finger of right middle finger    Knee pain, right 04/06/2015   Dysphagia 10/09/2010   GERD (gastroesophageal reflux disease) 10/09/2010   Encounter for screening colonoscopy 10/09/2010    PCP: Leonce Lucie PARAS, PA-C   REFERRING PROVIDER: Margrette Taft BRAVO, MD   REFERRING DIAG:  M54.50 (ICD-10-CM) - Lumbar pain  M51.362 (ICD-10-CM) - Degeneration of intervertebral disc of lumbar region with discogenic back pain and lower extremity pain    Rationale for Evaluation and Treatment:  Rehabiliation  THERAPY DIAG:  Other low back pain  Pain in left lower leg  Muscle weakness (generalized)  Difficulty in walking, not elsewhere classified  ONSET DATE: One month onset of pain   SUBJECTIVE:                                                                                                                                                                                           SUBJECTIVE STATEMENT: 63 year old female with left leg pain that runs down the back of her leg and she is having numbness in her picky toe and cramping.  The patient had a successful right total knee several years ago but says it is doing good. She does report she has bursitis and arthritis in it bad. She does say she had bad episode in 2021 and she had ruptured appendix and colon and became septic and ended up bed ridden for about 6 months so she has become inactive.    Comes in today complains of pain in her left leg numbness in her left toe    PERTINENT HISTORY:  See above PMH  PAIN:  NPRS scale: 6/10 upon arrival Pain location:left low back, buttock and down leg to pinky toe Pain description: intermittent, achy, sharp, numbness, achy Aggravating factors: sitting too long, bending over Relieving factors: sometimes it just goes away   PRECAUTIONS: ,  None  RED FLAGS: None   WEIGHT  BEARING  RESTRICTIONS:  No  FALLS:  Has patient fallen in last 6 months? No   OCCUPATION:  Disabled.  PLOF:  Independent  PATIENT GOALS:  Reduce pain, be more active, improve posture.   OBJECTIVE:  Note: Objective measures were completed at Evaluation unless otherwise noted.  DIAGNOSTIC FINDINGS:  Result Date: 11/24/2023 X-ray report Chief complaint leg ache and small toe numb Images L spine 3 views Reading: slight coronal plane mal alignment End plate spurs Mild joint sp narrowing Mild facet arthritis Impression: mild to mod DDD   DG Knee AP/LAT W/Sunrise Left Result Date: 11/24/2023 X-ray report Chief complaint left knee pain Images 3 views Reading: varus alignment Joint sp narrowing medially with mod to severe osteophytes Impression: grade 3 OA   PATIENT SURVEYS:  Patient-Specific Activity Scoring Scheme  0 represents "unable to perform." 10 represents "able to perform at prior level. 0 1 2 3 4 5 6 7 8 9  10 (Date and Score)   Activity Eval     1. Standing 30 minutes 2    2.       3.     4.    5.    Score 2/10    Total score = sum of the activity scores/number of activities Minimum detectable change (90%CI) for average score = 2 points Minimum detectable change (90%CI) for single activity score = 3 points     EDEMA:  No  GAIT: Assistive device utilized: None Level of assistance: Complete Independence Comments: limited distances due to pain and has difficulty with upright posture,     LUMBAR ROM:   Active  AROM  eval  Flexion 90%  Extension 50% feels good  Right lateral flexion 75% and pull on left side  Left lateral flexion 75%  Right rotation 25%  Left rotation 25%   (Blank rows = not tested)   LOWER EXTREMITY MMT:    MMT Right eval Left eval  Hip flexion 4+ 4 and pain   Hip extension    Hip abduction 4+ 4 and cramping  Hip adduction    Hip internal rotation    Hip external rotation    Knee flexion 5 4 and pain  Knee extension 5 4+   Ankle dorsiflexion 5 5  Ankle plantarflexion 5 5 in sitting  Ankle inversion    Ankle eversion     (Blank rows = not tested)    SPECIAL TESTS:  Lumbar Slump test: Positive                                                                                                                            TREATMENT DATE:  Eval HEP creation and review with demonstration and trial set preformed, see below for details Nu step L5 X 8 min UE/LE with moist heat to back    PATIENT EDUCATION: Education details: HEP, PT plan of care, selfcare Person educated: Patient Education method: Explanation, Demonstration, Verbal cues, and Handouts Education comprehension:  verbalized understanding, further education recommended   HOME EXERCISE PROGRAM: Access Code: [email or web address] URL: [email or web address] Date: 11/29/2023 Prepared by: Redell Moose  Exercises - Standing Lumbar Extension  - 2 x daily - 6 x weekly - 2 sets - 10 reps - Standing Shoulder Row with Anchored Resistance  - 2 x daily - 6 x weekly - 2 sets - 10 reps - Shoulder extension with resistance - Neutral  - 2 x daily - 6 x weekly - 2 sets - 10 reps - Seated Piriformis Stretch (Mirrored)  - 2 x daily - 6 x weekly - 1 sets - 3 reps - 30 hold - Seated Slump Nerve Glide (Mirrored)  - 2 x daily - 6 x weekly - 1 sets - 10 reps - 3 sec hold  ASSESSMENT:  CLINICAL IMPRESSION: Patient referred to PT for evaluate and treat lumbar spine with discogenic back pain causing some left sided radiculopathy/sciatica, Referring provider recommending  3x weekly for 6 weeks . Patient will benefit from skilled PT to improve overall function and to address impairments and limitations listed below.  OBJECTIVE IMPAIRMENTS: decreased activity tolerance for ADL's, difficulty walking, decreased balance, decreased endurance, decreased mobility, decreased ROM, decreased strength, impaired flexibility, impairedLE use, and pain.  ACTIVITY LIMITATIONS: bending,  liftting, walking, standing, cleaning, community activity, driving, reaching, carry, occupation  PERSONAL FACTORS: see above PMH  also affecting patient's functional outcome.  REHAB POTENTIAL: Good  CLINICAL DECISION MAKING: Stable/uncomplicated  EVALUATION COMPLEXITY: Low    GOALS: Short term PT Goals Target date: 12/27/2023   Pt will be I and compliant with HEP. Baseline:  Goal status: New Pt will decrease pain by 25% overall Baseline: Goal status: New  Long term PT goals Target date:01/10/2024   Pt will improve lumbar ROM to [facility] to improve functional mobility Baseline: Goal status: New Pt will improve leg  strength to at least 4+/5 MMT to improve functional strength Baseline: Goal status: New Pt will improve Patient specific functional scale (PSFS) to at least 6/10 to show improved function level Baseline:2/10 Goal status: New Pt will reduce pain to overall less than 3/10 with usual activity, bending over, walking Baseline: Goal status: New Pt will be able to ambulate community distances at least 500 ft WNL gait pattern with only mild complaints Baseline: Goal status: New  PLAN: PT FREQUENCY: 2-3 times per week   PT DURATION: 6 weeks  PLANNED INTERVENTIONS  97110-Therapeutic exercises, 97530- Therapeutic activity, 97112- Neuromuscular re-education, 97535- Self Care, [id number]- Manual therapy, [id number]- Electrical stimulation (unattended), and 97012- Traction (mechanical)  PLAN FOR NEXT SESSION: review HEP, focus on extension based program, work on general strength/endurance getting her more active, posture, walking tolerance NEXT MD VISIT: 01/24/24  Redell JONELLE Moose, PT,DPT 11/29/2023, 11:47 AM

## 2023-12-07 ENCOUNTER — Ambulatory Visit: Admitting: Physical Therapy

## 2023-12-07 ENCOUNTER — Encounter: Payer: Self-pay | Admitting: Physical Therapy

## 2023-12-07 DIAGNOSIS — M5459 Other low back pain: Secondary | ICD-10-CM

## 2023-12-07 DIAGNOSIS — M6281 Muscle weakness (generalized): Secondary | ICD-10-CM

## 2023-12-07 DIAGNOSIS — R262 Difficulty in walking, not elsewhere classified: Secondary | ICD-10-CM

## 2023-12-07 DIAGNOSIS — M79662 Pain in left lower leg: Secondary | ICD-10-CM

## 2023-12-07 NOTE — Therapy (Signed)
 OUTPATIENT PHYSICAL THERAPY EVALUATION   Patient Name: [name] MRN: [id number] DOB:March 16, 1960, 63 y.o., female Today's Date: 12/07/2023  END OF SESSION:  PT End of Session - 12/07/23 1019     Visit Number 2    Number of Visits 12    Date for Recertification  01/10/24    Authorization Type [employer]    PT Start Time 1005    PT Stop Time 1045    PT Time Calculation (min) 40 min    Activity Tolerance Patient tolerated treatment well    Behavior During Therapy WFL for tasks assessed/performed          Past Medical History:  Diagnosis Date   Anxiety    Arthritis    Colon perforation (HCC)    due to appendicitis   GERD (gastroesophageal reflux disease)    Hypertension    S/P endoscopy    2008: noncritical Schatzki ring s/p dilation, normal esophagus and stomach, 2003: normal esophagus, s/p Maloney dilation, multiple antral erosions consistent with chronic gastritis, no H.pylori,    Past Surgical History:  Procedure Laterality Date   APPENDECTOMY     BOWEL RESECTION N/A 12/06/2019   Procedure: PARTIAL SMALL BOWEL RESECTION WITH ILEOSTOMY;  Surgeon: Mavis Anes, MD;  Location: [facility];  Service: General;  Laterality: N/A;   CENTRAL LINE INSERTION N/A 12/06/2019   Procedure: CENTRAL LINE INSERTION;  Surgeon: Mavis Anes, MD;  Location: [facility];  Service: General;  Laterality: N/A;   CESAREAN SECTION     CHOLECYSTECTOMY     COLONOSCOPY  10/17/2010   normal    ESOPHAGOGASTRODUODENOSCOPY  10/17/2010   non-critical Schatzki's ring s/p dilation, patulous EG junction, small hiatal hernia, otherwise normal stomach, first and second portion of duodenum   ESOPHAGOGASTRODUODENOSCOPY N/A 10/26/2018   Procedure: ESOPHAGOGASTRODUODENOSCOPY (EGD);  Surgeon: Shaaron Lamar HERO, MD;  Location: [facility];  Service: Endoscopy;  Laterality: N/A;  9:15am   FOOT SURGERY Right    heel spur   HAND SURGERY Left    carpal tunnel   HERNIA REPAIR N/A    approx.    10 or 12 years ago   IR  FLUORO GUIDE CV LINE LEFT  06/14/2020   IR FLUORO GUIDE CV LINE RIGHT  09/06/2020   IR US  GUIDE VASC ACCESS RIGHT  09/06/2020   KNEE ARTHROSCOPY WITH MEDIAL MENISECTOMY  01/07/2012   Procedure: KNEE ARTHROSCOPY WITH MEDIAL MENISECTOMY;  Surgeon: Lemond Stable, MD;  Location: [facility];  Service: Orthopedics;  Laterality: Right;   LAPAROTOMY N/A 12/04/2019   Procedure: EXPLORATORY LAPAROTOMY;  Surgeon: Mavis Anes, MD;  Location: [facility];  Service: General;  Laterality: N/A;   LAPAROTOMY N/A 12/06/2019   Procedure: EXPLORATORY LAPAROTOMY;  Surgeon: Mavis Anes, MD;  Location: [facility];  Service: General;  Laterality: N/A;   MALONEY DILATION  10/17/2010   Procedure: AGAPITO HODGKIN;  Surgeon: Lamar HERO Shaaron, MD;  Location: [facility];  Service: Endoscopy;  Laterality: N/A;   MALONEY DILATION N/A 10/26/2018   Procedure: AGAPITO DILATION;  Surgeon: Shaaron Lamar HERO, MD;  [facility]: [facility];  Service: Endoscopy;  Laterality: N/A;   PARTIAL COLECTOMY Right 12/04/2019   Procedure: RIGHT HEMI COLECTOMY WITH TERMINAL ILEUM RESECTION;  Surgeon: Mavis Anes, MD;  Location: [facility];  Service: General;  Laterality: Right;   TOTAL KNEE ARTHROPLASTY Right 04/18/2019   Procedure: RIGHT TOTAL KNEE ARTHROPLASTY;  Surgeon: Margrette Taft BRAVO, MD;  Location: [facility];  Service: Orthopedics;  Laterality: Right;   TRIGGER FINGER RELEASE  Right 08/20/2016   Procedure: RELEASE A-1 PULLEY RIGHT LONG FINGER;  Surgeon: Margrette Taft BRAVO, MD;  Location: [facility];  Service: Orthopedics;  Laterality: Right;   Patient Active Problem List   Diagnosis Date Noted   Hyponatremia 09/02/2020   AKI (acute kidney injury) 05/14/2020   Lactic acidosis 05/14/2020   Dehydration 05/14/2020   PICC line infection, initial encounter    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362 (ICD-10-CM) - Degeneration of intervertebral disc of lumbar region with discogenic back pain and lower extremity pain    Rationale for Evaluation and Treatment:  Rehabiliation  THERAPY DIAG:  Other low back pain  Pain in left lower leg  Muscle weakness (generalized)  Difficulty in walking, not elsewhere classified  ONSET DATE: One month onset of pain   SUBJECTIVE:                                                                                                                                                                                           SUBJECTIVE STATEMENT:She has been doing exercises, and does feel some better, Her numbness in her toe is better and she is not having cramps like she was. She has been also taking magnesium , potassium, and tumeric supplements.    Per eval: 63 year old female with left leg pain that runs down the back of her leg and she is having numbness in her picky toe and cramping.  The patient had a successful right total knee several years ago but says it is doing good. She does report she has bursitis and arthritis in it bad. She does say she had bad episode in 2021 and she had ruptured appendix and colon and became septic and ended up bed ridden for about 6 months so she has become inactive.    Comes in today complains of pain in her left leg numbness in her left toe    PERTINENT HISTORY:  See above PMH  PAIN:  NPRS scale: 2/10 upon arrival Pain location:left low back, buttock  and down leg to pinky toe Pain description:  intermittent, achy, sharp, numbness, achy Aggravating factors: sitting too long, bending over Relieving factors: sometimes it just goes away   PRECAUTIONS: ,  None  RED FLAGS: None   WEIGHT BEARING RESTRICTIONS:  No  FALLS:  Has patient fallen in last 6 months? No   OCCUPATION:  Disabled.  PLOF:  Independent  PATIENT GOALS:  Reduce pain, be more active, improve posture.   OBJECTIVE:  Note: Objective measures were completed at Evaluation unless otherwise noted.  DIAGNOSTIC FINDINGS:  Result Date: 11/24/2023 X-ray report Chief complaint leg ache and small toe numb Images L spine 3 views Reading: slight coronal plane mal alignment End plate spurs Mild joint sp narrowing Mild facet arthritis Impression: mild to mod DDD   DG Knee AP/LAT W/Sunrise Left Result Date: 11/24/2023 X-ray report Chief complaint left knee pain Images 3 views Reading: varus alignment Joint sp narrowing medially with mod to severe osteophytes Impression: grade 3 OA   PATIENT SURVEYS:  Patient-Specific Activity Scoring Scheme  0 represents "unable to perform." 10 represents "able to perform at prior level. 0 1 2 3 4 5 6 7 8 9  10 (Date and Score)   Activity Eval     1. Standing 30 minutes 2    2.       3.     4.    5.    Score 2/10    Total score = sum of the activity scores/number of activities Minimum detectable change (90%CI) for average score = 2 points Minimum detectable change (90%CI) for single activity score = 3 points     EDEMA:  No  GAIT: Assistive device utilized: None Level of assistance: Complete Independence Comments: limited distances due to pain and has difficulty with upright posture,     LUMBAR ROM:   Active  AROM  eval  Flexion 90%  Extension 50% feels good  Right lateral flexion 75% and pull on left side  Left lateral flexion 75%  Right rotation 25%  Left rotation 25%   (Blank rows = not tested)   LOWER EXTREMITY MMT:    MMT Right eval  Left eval  Hip flexion 4+ 4 and pain   Hip extension    Hip abduction 4+ 4 and cramping  Hip adduction    Hip internal rotation    Hip external rotation    Knee flexion 5 4 and pain  Knee extension 5 4+  Ankle dorsiflexion 5 5  Ankle plantarflexion 5 5 in sitting  Ankle inversion    Ankle eversion     (Blank rows = not tested)    SPECIAL TESTS:  Lumbar Slump test: Positive                                                                                                                            TREATMENT DATE:  12/07/23 Therex: HEP review with demonstration and education Nu step L5 X 10  min UE/LE with moist heat to back Standing lumbar extensions on wall 5 sec X 10 Seated slump/sciatic stretch 3 sec X 10 on left True X ride L1 X 5 min Theractivity Standing rows red 2X10 Standing shoulder extensions red 2X10 Sidestepping in bars without UE support 4 round trips March walking in bars with UE support 4 round trips.      PATIENT EDUCATION: Education details: HEP, PT plan of care, selfcare Person educated: Patient Education method: Explanation, Demonstration, Verbal cues, and Handouts Education comprehension: verbalized understanding, further education recommended   HOME EXERCISE PROGRAM: Access Code: [email or web address] URL: [email or web address] Date: 11/29/2023 Prepared by: Redell Moose  Exercises - Standing Lumbar Extension  - 2 x daily - 6 x weekly - 2 sets - 10 reps - Standing Shoulder Row with Anchored Resistance  - 2 x daily - 6 x weekly - 2 sets - 10 reps - Shoulder extension with resistance - Neutral  - 2 x daily - 6 x weekly - 2 sets - 10 reps - Seated Piriformis Stretch (Mirrored)  - 2 x daily - 6 x weekly - 1 sets - 3 reps - 30 hold - Seated Slump Nerve Glide (Mirrored)  - 2 x daily - 6 x weekly - 1 sets - 10 reps - 3 sec hold  ASSESSMENT:  CLINICAL IMPRESSION: Session focused on HEP with demonstration and education provided. She shows good  understanding of them and is showing some good early progress with less pain. She tolerated additional strength/endurance exercises well.    Per Eval:  Patient referred to PT for evaluate and treat lumbar spine with discogenic back pain causing some left sided radiculopathy/sciatica, Referring provider recommending  3x weekly for 6 weeks . Patient will benefit from skilled PT to improve overall function and to address impairments and limitations listed below.  OBJECTIVE IMPAIRMENTS: decreased activity tolerance for ADL's, difficulty walking, decreased balance, decreased endurance, decreased mobility, decreased ROM, decreased strength, impaired flexibility, impairedLE use, and pain.  ACTIVITY LIMITATIONS: bending, liftting, walking, standing, cleaning, community activity, driving, reaching, carry, occupation  PERSONAL FACTORS: see above PMH  also affecting patient's functional outcome.  REHAB POTENTIAL: Good  CLINICAL DECISION MAKING: Stable/uncomplicated  EVALUATION COMPLEXITY: Low    GOALS: Short term PT Goals Target date: 12/27/2023   Pt will be I and compliant with HEP. Baseline:  Goal status: New Pt will decrease pain by 25% overall Baseline: Goal status: New  Long term PT goals Target date:01/10/2024   Pt will improve lumbar ROM to [facility] to improve functional mobility Baseline: Goal status: New Pt will improve leg  strength to at least 4+/5 MMT to improve functional strength Baseline: Goal status: New Pt will improve Patient specific functional scale (PSFS) to at least 6/10 to show improved function level Baseline:2/10 Goal status: New Pt will reduce pain to overall less than 3/10 with usual activity, bending over, walking Baseline: Goal status: New Pt will be able to ambulate community distances at least 500 ft WNL gait pattern with only mild complaints Baseline: Goal status: New  PLAN: PT FREQUENCY: 2-3 times per week   PT DURATION: 6 weeks  PLANNED  INTERVENTIONS  97110-Therapeutic exercises, 97530- Therapeutic activity, 97112- Neuromuscular re-education, 97535- Self Care, [id number]- Manual therapy, [id number]- Electrical stimulation (unattended), and 97012- Traction (mechanical)  PLAN FOR NEXT SESSION: monitor for soreness,  focus on extension based program, work on general strength/endurance getting her more active, posture, walking tolerance NEXT MD VISIT: 01/24/24  Redell JONELLE Moose, PT,DPT 12/07/2023, 10:41 AM

## 2023-12-09 ENCOUNTER — Encounter: Admitting: Physical Therapy

## 2023-12-09 ENCOUNTER — Ambulatory Visit: Admitting: Physical Therapy

## 2023-12-09 ENCOUNTER — Encounter: Payer: Self-pay | Admitting: Physical Therapy

## 2023-12-09 DIAGNOSIS — M6281 Muscle weakness (generalized): Secondary | ICD-10-CM

## 2023-12-09 DIAGNOSIS — R262 Difficulty in walking, not elsewhere classified: Secondary | ICD-10-CM

## 2023-12-09 DIAGNOSIS — M5459 Other low back pain: Secondary | ICD-10-CM | POA: Diagnosis not present

## 2023-12-09 DIAGNOSIS — M79662 Pain in left lower leg: Secondary | ICD-10-CM

## 2023-12-09 NOTE — Therapy (Signed)
 OUTPATIENT PHYSICAL THERAPY TREATMENT    Patient Name: [name] MRN: [id number] DOB:[birth year], 63 y.o., female Today's Date: 12/09/2023  END OF SESSION:  PT End of Session - 12/09/23 1404     Visit Number 3    Number of Visits 12    Date for Recertification  01/10/24    Authorization Type [employer]    Progress Note Due on Visit 10    PT Start Time 1340    PT Stop Time 1419    PT Time Calculation (min) 39 min    Activity Tolerance Patient tolerated treatment well    Behavior During Therapy WFL for tasks assessed/performed           Past Medical History:  Diagnosis Date   Anxiety    Arthritis    Colon perforation (HCC)    due to appendicitis   GERD (gastroesophageal reflux disease)    Hypertension    S/P endoscopy    2008: noncritical Schatzki ring s/p dilation, normal esophagus and stomach, 2003: normal esophagus, s/p Maloney dilation, multiple antral erosions consistent with chronic gastritis, no H.pylori,    Past Surgical History:  Procedure Laterality Date   APPENDECTOMY     BOWEL RESECTION N/A 12/06/2019   Procedure: PARTIAL SMALL BOWEL RESECTION WITH ILEOSTOMY;  Surgeon: Mavis Anes, MD;  Location: [facility];  Service: General;  Laterality: N/A;   CENTRAL LINE INSERTION N/A 12/06/2019   Procedure: CENTRAL LINE INSERTION;  Surgeon: Mavis Anes, MD;  Location: [facility];  Service: General;  Laterality: N/A;   CESAREAN SECTION     CHOLECYSTECTOMY     COLONOSCOPY  10/17/2010   normal    ESOPHAGOGASTRODUODENOSCOPY  10/17/2010   non-critical Schatzki's ring s/p dilation, patulous EG junction, small hiatal hernia, otherwise normal stomach, first and second portion of duodenum   ESOPHAGOGASTRODUODENOSCOPY N/A 10/26/2018   Procedure: ESOPHAGOGASTRODUODENOSCOPY (EGD);  Surgeon: Shaaron Lamar HERO, MD;  Location: [facility];  Service: Endoscopy;  Laterality: N/A;  9:15am   FOOT SURGERY Right    heel spur   HAND SURGERY Left    carpal tunnel   HERNIA REPAIR N/A     approx.    10 or 12 years ago   IR FLUORO GUIDE CV LINE LEFT  06/14/2020   IR FLUORO GUIDE CV LINE RIGHT  09/06/2020   IR US  GUIDE VASC ACCESS RIGHT  09/06/2020   KNEE ARTHROSCOPY WITH MEDIAL MENISECTOMY  01/07/2012   Procedure: KNEE ARTHROSCOPY WITH MEDIAL MENISECTOMY;  Surgeon: Lemond Stable, MD;  Location: [facility];  Service: Orthopedics;  Laterality: Right;   LAPAROTOMY N/A 12/04/2019   Procedure: EXPLORATORY LAPAROTOMY;  Surgeon: Mavis Anes, MD;  Location: [facility];  Service: General;  Laterality: N/A;   LAPAROTOMY N/A 12/06/2019   Procedure: EXPLORATORY LAPAROTOMY;  Surgeon: Mavis Anes, MD;  Location: [facility];  Service: General;  Laterality: N/A;   MALONEY DILATION  10/17/2010   Procedure: AGAPITO HODGKIN;  Surgeon: Lamar HERO Shaaron, MD;  Location: [facility];  Service: Endoscopy;  Laterality: N/A;   MALONEY DILATION N/A 10/26/2018   Procedure: AGAPITO DILATION;  Surgeon: Shaaron Lamar HERO, MD;  [facility]: [facility];  Service: Endoscopy;  Laterality: N/A;   PARTIAL COLECTOMY Right 12/04/2019   Procedure: RIGHT HEMI COLECTOMY WITH TERMINAL ILEUM RESECTION;  Surgeon: Mavis Anes, MD;  Location: [facility];  Service: General;  Laterality: Right;   TOTAL KNEE ARTHROPLASTY Right 04/18/2019   Procedure: RIGHT TOTAL KNEE ARTHROPLASTY;  Surgeon: Margrette Taft BRAVO, MD;  Location: [facility];  Service: Orthopedics;  Laterality: Right;   TRIGGER FINGER RELEASE Right 08/20/2016   Procedure: RELEASE A-1 PULLEY RIGHT LONG FINGER;  Surgeon: Margrette Taft BRAVO, MD;  Location: [facility];  Service: Orthopedics;  Laterality: Right;   Patient Active Problem List   Diagnosis Date Noted   Hyponatremia 09/02/2020   AKI (acute kidney injury) 05/14/2020   Lactic acidosis 05/14/2020   Dehydration 05/14/2020   PICC line infection, initial encounter    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   Small bowel perforation (HCC)    S/P partial colectomy 12/04/2019   Peritonitis due to abscess [facility])    Colon perforation (HCC)     S/P total knee replacement, 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   Primary osteoarthritis of right knee    Chest pain 03/02/2018   Acquired trigger finger of right middle finger    Knee pain, right 04/06/2015   Dysphagia 10/09/2010   GERD (gastroesophageal reflux disease) 10/09/2010   Encounter for screening colonoscopy 10/09/2010    PCP: Leonce Lucie PARAS, PA-C   REFERRING PROVIDER: Margrette Taft BRAVO, MD   REFERRING DIAG:  M54.50 (ICD-10-CM) - Lumbar pain  M51.362 (ICD-10-CM) - Degeneration of intervertebral disc of lumbar region with discogenic back pain and lower extremity pain    Rationale for Evaluation and Treatment:  Rehabiliation  THERAPY DIAG:  Other low back pain  Pain in left lower leg  Muscle weakness (generalized)  Difficulty in walking, not elsewhere classified  ONSET DATE: One month onset of pain   SUBJECTIVE:                                                                                                                                                                                           SUBJECTIVE STATEMENT:  I've been having some issues with the shoulder exercises he gave me, I think I have some spurs in there and yesterday the band exercises really bothered me. Back is doing well, numbness in toe is getting better. Have been feeling better since starting PT workouts. Bending is getting better/less painful   Per eval: 63 year old female with left leg pain that runs down the back of her leg and she is having numbness in her picky toe and cramping.  The patient had a successful right total knee several years ago but says it is doing good. She does report she has bursitis and arthritis in it bad. She does say she had bad episode in 2021 and she had ruptured appendix and colon and became septic and ended up bed ridden for about 6 months so she has become inactive.    Comes in today complains of pain in her left leg numbness  in her left toe    PERTINENT HISTORY:   See above PMH  PAIN:  NPRS scale: 4/10 upon arrival Pain location:left low back, buttock and down leg to pinky toe Pain description: intermittent, achy, sharp, numbness, achy Aggravating factors: sitting too long (bending is much better) Relieving factors: heat and back HEP except for shoulder exercises    PRECAUTIONS: ,  None  RED FLAGS: None   WEIGHT BEARING RESTRICTIONS:  No  FALLS:  Has patient fallen in last 6 months? No   OCCUPATION:  Disabled.  PLOF:  Independent  PATIENT GOALS:  Reduce pain, be more active, improve posture.   OBJECTIVE:  Note: Objective measures were completed at Evaluation unless otherwise noted.  DIAGNOSTIC FINDINGS:  Result Date: 11/24/2023 X-ray report Chief complaint leg ache and small toe numb Images L spine 3 views Reading: slight coronal plane mal alignment End plate spurs Mild joint sp narrowing Mild facet arthritis Impression: mild to mod DDD   DG Knee AP/LAT W/Sunrise Left Result Date: 11/24/2023 X-ray report Chief complaint left knee pain Images 3 views Reading: varus alignment Joint sp narrowing medially with mod to severe osteophytes Impression: grade 3 OA   PATIENT SURVEYS:  Patient-Specific Activity Scoring Scheme  0 represents "unable to perform." 10 represents "able to perform at prior level. 0 1 2 3 4 5 6 7 8 9  10 (Date and Score)   Activity Eval     1. Standing 30 minutes 2    2.       3.     4.    5.    Score 2/10    Total score = sum of the activity scores/number of activities Minimum detectable change (90%CI) for average score = 2 points Minimum detectable change (90%CI) for single activity score = 3 points     EDEMA:  No  GAIT: Assistive device utilized: None Level of assistance: Complete Independence Comments: limited distances due to pain and has difficulty with upright posture,     LUMBAR ROM:   Active  AROM  eval 12/09/23  Flexion 90% WNL  Extension 50% feels good 50% limited    Right lateral flexion 75% and pull on left side WNL   Left lateral flexion 75% 25% limited   Right rotation 25% 50% limited   Left rotation 25% 50% limited    (Blank rows = not tested)   LOWER EXTREMITY MMT:    MMT Right eval Left eval  Hip flexion 4+ 4 and pain   Hip extension    Hip abduction 4+ 4 and cramping  Hip adduction    Hip internal rotation    Hip external rotation    Knee flexion 5 4 and pain  Knee extension 5 4+  Ankle dorsiflexion 5 5  Ankle plantarflexion 5 5 in sitting  Ankle inversion    Ankle eversion     (Blank rows = not tested)    SPECIAL TESTS:  Lumbar Slump test: Positive  TREATMENT DATE:    12/09/23  Nustep L5x8 minutes all four extremities 8 minutes Lumbar ROM check, goals  UBE L4x5 minutes backwards only for postural mm endurance and retraining, no increased shoulder pain with this  L HS stretching and sciatic flossing    Hooklying TA + march x10 Prone hip extensions bent knee x10 B Hip hikes x10 B Hip hikes + ABD x10 B Standing 3 way hip red TB x10 B  Standing lumbar extensions against wall 10x3 seconds progressed depth as tolerated Sidestepping length of // bars red TB at knees x3 laps       12/07/23 Therex: HEP review with demonstration and education Nu step L5 X 10 min UE/LE with moist heat to back Standing lumbar extensions on wall 5 sec X 10 Seated slump/sciatic stretch 3 sec X 10 on left True X ride L1 X 5 min Theractivity Standing rows red 2X10 Standing shoulder extensions red 2X10 Sidestepping in bars without UE support 4 round trips March walking in bars with UE support 4 round trips.      PATIENT EDUCATION: Education details: HEP, PT plan of care, selfcare Person educated: Patient Education method: Explanation, Demonstration, Verbal cues, and Handouts Education comprehension:  verbalized understanding, further education recommended   HOME EXERCISE PROGRAM:  Access Code: [email or web address] URL: [email or web address] Date: 12/09/2023 Prepared by: Josette Rough  Exercises - Standing Lumbar Extension  - 2 x daily - 6 x weekly - 2 sets - 10 reps - Seated Piriformis Stretch (Mirrored)  - 2 x daily - 6 x weekly - 1 sets - 3 reps - 30 hold - Seated Slump Nerve Glide (Mirrored)  - 2 x daily - 6 x weekly - 1 sets - 10 reps - 3 sec hold - TA Bracing + March   - 1 x daily - 7 x weekly - 2 sets - 10 reps - Prone Hip Extension with Bent Knee  - 1 x daily - 7 x weekly - 2 sets - 10 reps  ASSESSMENT:  CLINICAL IMPRESSION:   Arrives today doing OK, she has been having some issues with her shoulder with therband work-  she does report some issues with possible shoulder spurring so I changed her HEP up a bit. We continued working on extension based exercises, core, and postural retraining as per POC. Tolerated all interventions OK, she has some intermittent mm cramping during new exercises which somewhat limited interventions today. Lumbar pain 1-2/10 at EOS.    Per Eval:  Patient referred to PT for evaluate and treat lumbar spine with discogenic back pain causing some left sided radiculopathy/sciatica, Referring provider recommending  3x weekly for 6 weeks . Patient will benefit from skilled PT to improve overall function and to address impairments and limitations listed below.  OBJECTIVE IMPAIRMENTS: decreased activity tolerance for ADL's, difficulty walking, decreased balance, decreased endurance, decreased mobility, decreased ROM, decreased strength, impaired flexibility, impairedLE use, and pain.  ACTIVITY LIMITATIONS: bending, liftting, walking, standing, cleaning, community activity, driving, reaching, carry, occupation  PERSONAL FACTORS: see above PMH  also affecting patient's functional outcome.  REHAB POTENTIAL: Good  CLINICAL DECISION MAKING:  Stable/uncomplicated  EVALUATION COMPLEXITY: Low    GOALS: Short term PT Goals Target date: 12/27/2023   Pt will be I and compliant with HEP. Baseline:  Goal status: MET 12/09/23  Pt will decrease pain by 25% overall Baseline: Goal status: MET 12/09/23  Long term PT goals Target date:01/10/2024   Pt will improve lumbar ROM to [facility] to improve functional mobility  Baseline: Goal status: ONGOING 12/09/23 see table  Pt will improve leg  strength to at least 4+/5 MMT to improve functional strength Baseline: Goal status: New Pt will improve Patient specific functional scale (PSFS) to at least 6/10 to show improved function level Baseline:2/10 Goal status: New Pt will reduce pain to overall less than 3/10 with usual activity, bending over, walking Baseline: Goal status: New Pt will be able to ambulate community distances at least 500 ft WNL gait pattern with only mild complaints Baseline: Goal status: New  PLAN: PT FREQUENCY: 2-3 times per week   PT DURATION: 6 weeks  PLANNED INTERVENTIONS  97110-Therapeutic exercises, 97530- Therapeutic activity, [id number]- Neuromuscular re-education, 97535- Self Care, [id number]- Manual therapy, [id number]- Electrical stimulation (unattended), and 97012- Traction (mechanical)  PLAN FOR NEXT SESSION: monitor for soreness,  focus on extension based program, work on general strength/endurance getting her more active, posture, walking tolerance. How is revised HEP? Careful with L shoulder/exercises due to pain with TB work   NEXT MD VISIT: 01/24/24  Josette Rough, PT, DPT 12/09/23 2:20 PM

## 2023-12-14 ENCOUNTER — Ambulatory Visit: Admitting: Physical Therapy

## 2023-12-14 ENCOUNTER — Encounter: Payer: Self-pay | Admitting: Physical Therapy

## 2023-12-14 DIAGNOSIS — M5459 Other low back pain: Secondary | ICD-10-CM

## 2023-12-14 DIAGNOSIS — R262 Difficulty in walking, not elsewhere classified: Secondary | ICD-10-CM

## 2023-12-14 DIAGNOSIS — M79662 Pain in left lower leg: Secondary | ICD-10-CM

## 2023-12-14 DIAGNOSIS — M6281 Muscle weakness (generalized): Secondary | ICD-10-CM

## 2023-12-14 NOTE — Therapy (Signed)
 OUTPATIENT PHYSICAL THERAPY TREATMENT    Patient Name: [name] MRN: [id number] DOB:Jan 05, 1961, 63 y.o., female Today's Date: 12/14/2023  END OF SESSION:  PT End of Session - 12/14/23 1108     Visit Number 4    Number of Visits 12    Date for Recertification  01/10/24    Authorization Type [employer]    Progress Note Due on Visit 10    PT Start Time 1104    PT Stop Time 1145    PT Time Calculation (min) 41 min    Activity Tolerance Patient tolerated treatment well    Behavior During Therapy WFL for tasks assessed/performed           Past Medical History:  Diagnosis Date   Anxiety    Arthritis    Colon perforation (HCC)    due to appendicitis   GERD (gastroesophageal reflux disease)    Hypertension    S/P endoscopy    2008: noncritical Schatzki ring s/p dilation, normal esophagus and stomach, 2003: normal esophagus, s/p Maloney dilation, multiple antral erosions consistent with chronic gastritis, no H.pylori,    Past Surgical History:  Procedure Laterality Date   APPENDECTOMY     BOWEL RESECTION N/A 12/06/2019   Procedure: PARTIAL SMALL BOWEL RESECTION WITH ILEOSTOMY;  Surgeon: Mavis Anes, MD;  Location: [facility];  Service: General;  Laterality: N/A;   CENTRAL LINE INSERTION N/A 12/06/2019   Procedure: CENTRAL LINE INSERTION;  Surgeon: Mavis Anes, MD;  Location: [facility];  Service: General;  Laterality: N/A;   CESAREAN SECTION     CHOLECYSTECTOMY     COLONOSCOPY  10/17/2010   normal    ESOPHAGOGASTRODUODENOSCOPY  10/17/2010   non-critical Schatzki's ring s/p dilation, patulous EG junction, small hiatal hernia, otherwise normal stomach, first and second portion of duodenum   ESOPHAGOGASTRODUODENOSCOPY N/A 10/26/2018   Procedure: ESOPHAGOGASTRODUODENOSCOPY (EGD);  Surgeon: Shaaron Lamar HERO, MD;  Location: [facility];  Service: Endoscopy;  Laterality: N/A;  9:15am   FOOT SURGERY Right    heel spur   HAND SURGERY Left    carpal tunnel   HERNIA REPAIR N/A     approx.    10 or 12 years ago   IR FLUORO GUIDE CV LINE LEFT  06/14/2020   IR FLUORO GUIDE CV LINE RIGHT  09/06/2020   IR US  GUIDE VASC ACCESS RIGHT  09/06/2020   KNEE ARTHROSCOPY WITH MEDIAL MENISECTOMY  01/07/2012   Procedure: KNEE ARTHROSCOPY WITH MEDIAL MENISECTOMY;  Surgeon: Lemond Stable, MD;  Location: [facility];  Service: Orthopedics;  Laterality: Right;   LAPAROTOMY N/A 12/04/2019   Procedure: EXPLORATORY LAPAROTOMY;  Surgeon: Mavis Anes, MD;  Location: [facility];  Service: General;  Laterality: N/A;   LAPAROTOMY N/A 12/06/2019   Procedure: EXPLORATORY LAPAROTOMY;  Surgeon: Mavis Anes, MD;  Location: [facility];  Service: General;  Laterality: N/A;   MALONEY DILATION  10/17/2010   Procedure: AGAPITO HODGKIN;  Surgeon: Lamar HERO Shaaron, MD;  Location: [facility];  Service: Endoscopy;  Laterality: N/A;   MALONEY DILATION N/A 10/26/2018   Procedure: AGAPITO DILATION;  Surgeon: Shaaron Lamar HERO, MD;  [facility]: [facility];  Service: Endoscopy;  Laterality: N/A;   PARTIAL COLECTOMY Right 12/04/2019   Procedure: RIGHT HEMI COLECTOMY WITH TERMINAL ILEUM RESECTION;  Surgeon: Mavis Anes, MD;  Location: [facility];  Service: General;  Laterality: Right;   TOTAL KNEE ARTHROPLASTY Right 04/18/2019   Procedure: RIGHT TOTAL KNEE ARTHROPLASTY;  Surgeon: Margrette Taft BRAVO, MD;  Location: [facility];  Service: Orthopedics;  Laterality: Right;   TRIGGER FINGER RELEASE Right 08/20/2016   Procedure: RELEASE A-1 PULLEY RIGHT LONG FINGER;  Surgeon: Margrette Taft BRAVO, MD;  Location: [facility];  Service: Orthopedics;  Laterality: Right;   Patient Active Problem List   Diagnosis Date Noted   Hyponatremia 09/02/2020   AKI (acute kidney injury) 05/14/2020   Lactic acidosis 05/14/2020   Dehydration 05/14/2020   PICC line infection, initial encounter    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   Small bowel perforation (HCC)    S/P partial colectomy 12/04/2019   Peritonitis due to abscess [facility])    Colon perforation (HCC)     S/P total knee replacement, 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   Primary osteoarthritis of right knee    Chest pain 03/02/2018   Acquired trigger finger of right middle finger    Knee pain, right 04/06/2015   Dysphagia 10/09/2010   GERD (gastroesophageal reflux disease) 10/09/2010   Encounter for screening colonoscopy 10/09/2010    PCP: Leonce Lucie PARAS, PA-C   REFERRING PROVIDER: Margrette Taft BRAVO, MD   REFERRING DIAG:  M54.50 (ICD-10-CM) - Lumbar pain  M51.362 (ICD-10-CM) - Degeneration of intervertebral disc of lumbar region with discogenic back pain and lower extremity pain    Rationale for Evaluation and Treatment:  Rehabiliation  THERAPY DIAG:  Other low back pain  Pain in left lower leg  Muscle weakness (generalized)  Difficulty in walking, not elsewhere classified  ONSET DATE: One month onset of pain   SUBJECTIVE:                                                                                                                                                                                           SUBJECTIVE STATEMENT: Back feels sore and her leg has pain and cramping today.   Per eval: 63 year old female with left leg pain that runs down the back of her leg and she is having numbness in her picky toe and cramping.  The patient had a successful right total knee several years ago but says it is doing good. She does report she has bursitis and arthritis in it bad. She does say she had bad episode in 2021 and she had ruptured appendix and colon and became septic and ended up bed ridden for about 6 months so she has become inactive.    Comes in today complains of pain in her left leg numbness in her left toe    PERTINENT HISTORY:  See above PMH  PAIN:  NPRS scale: 5/10 upon arrival Pain location:left low back, buttock and down leg to pinky toe Pain description: intermittent, achy, sharp, numbness, achy Aggravating factors:  sitting too long (bending is much  better) Relieving factors: heat and back HEP except for shoulder exercises    PRECAUTIONS: ,  None  RED FLAGS: None   WEIGHT BEARING RESTRICTIONS:  No  FALLS:  Has patient fallen in last 6 months? No   OCCUPATION:  Disabled.  PLOF:  Independent  PATIENT GOALS:  Reduce pain, be more active, improve posture.   OBJECTIVE:  Note: Objective measures were completed at Evaluation unless otherwise noted.  DIAGNOSTIC FINDINGS:  Result Date: 11/24/2023 X-ray report Chief complaint leg ache and small toe numb Images L spine 3 views Reading: slight coronal plane mal alignment End plate spurs Mild joint sp narrowing Mild facet arthritis Impression: mild to mod DDD   DG Knee AP/LAT W/Sunrise Left Result Date: 11/24/2023 X-ray report Chief complaint left knee pain Images 3 views Reading: varus alignment Joint sp narrowing medially with mod to severe osteophytes Impression: grade 3 OA   PATIENT SURVEYS:  Patient-Specific Activity Scoring Scheme  0 represents "unable to perform." 10 represents "able to perform at prior level. 0 1 2 3 4 5 6 7 8 9  10 (Date and Score)   Activity Eval     1. Standing 30 minutes 2    2.       3.     4.    5.    Score 2/10    Total score = sum of the activity scores/number of activities Minimum detectable change (90%CI) for average score = 2 points Minimum detectable change (90%CI) for single activity score = 3 points     EDEMA:  No  GAIT: Assistive device utilized: None Level of assistance: Complete Independence Comments: limited distances due to pain and has difficulty with upright posture,     LUMBAR ROM:   Active  AROM  eval 12/09/23  Flexion 90% WNL  Extension 50% feels good 50% limited   Right lateral flexion 75% and pull on left side WNL   Left lateral flexion 75% 25% limited   Right rotation 25% 50% limited   Left rotation 25% 50% limited    (Blank rows = not tested)   LOWER EXTREMITY MMT:    MMT Right eval  Left eval  Hip flexion 4+ 4 and pain   Hip extension    Hip abduction 4+ 4 and cramping  Hip adduction    Hip internal rotation    Hip external rotation    Knee flexion 5 4 and pain  Knee extension 5 4+  Ankle dorsiflexion 5 5  Ankle plantarflexion 5 5 in sitting  Ankle inversion    Ankle eversion     (Blank rows = not tested)    SPECIAL TESTS:  Lumbar Slump test: Positive                                                                                                                            TREATMENT DATE:  12/14/23 Therex Nustep seat #6 L5x8  minutes all four extremities 8 minutes L HS stretching and sciatic flossing X 10 reps bilat UBE L4x5 minutes backwards only for postural mm endurance and retraining, no increased shoulder pain with this  Standing lumbar extensions against wall 10x3 seconds progressed depth as tolerated  Theractivity Seated hamstring curls green 2X10 bilat Seated knee extensions green 2X10 bilat Hip hikes x10 B Standing hip abductions red 2X10 bilat Standing hip extensions red 2X10 bilat March walking in bars with UE support 4 round trips. Leg press machine DL 89# [id number]  89/83/74  Nustep L5x8 minutes all four extremities 8 minutes Lumbar ROM check, goals  UBE L4x5 minutes backwards only for postural mm endurance and retraining, no increased shoulder pain with this  L HS stretching and sciatic flossing    Hooklying TA + march x10 Prone hip extensions bent knee x10 B Hip hikes x10 B Hip hikes + ABD x10 B Standing 3 way hip red TB x10 B  Standing lumbar extensions against wall 10x3 seconds progressed depth as tolerated Sidestepping length of // bars red TB at knees x3 laps   PATIENT EDUCATION: Education details: HEP, PT plan of care, selfcare Person educated: Patient Education method: Explanation, Demonstration, Verbal cues, and Handouts Education comprehension: verbalized understanding, further education recommended   HOME EXERCISE  PROGRAM:  Access Code: [email or web address] URL: [email or web address] Date: 12/09/2023 Prepared by: Josette Rough  Exercises - Standing Lumbar Extension  - 2 x daily - 6 x weekly - 2 sets - 10 reps - Seated Piriformis Stretch (Mirrored)  - 2 x daily - 6 x weekly - 1 sets - 3 reps - 30 hold - Seated Slump Nerve Glide (Mirrored)  - 2 x daily - 6 x weekly - 1 sets - 10 reps - 3 sec hold - TA Bracing + March   - 1 x daily - 7 x weekly - 2 sets - 10 reps - Prone Hip Extension with Bent Knee  - 1 x daily - 7 x weekly - 2 sets - 10 reps  ASSESSMENT:  CLINICAL IMPRESSION: I had her discontinue the upper body band exercises due to shoulder complaints and will substitute these with more lower body band exercises instead. She showed good tolerance to strength and conditioning activities today. PT recommending to continue with current plan and progress as tolerated based on pain and soreness.    Per Eval:  Patient referred to PT for evaluate and treat lumbar spine with discogenic back pain causing some left sided radiculopathy/sciatica, Referring provider recommending  3x weekly for 6 weeks . Patient will benefit from skilled PT to improve overall function and to address impairments and limitations listed below.  OBJECTIVE IMPAIRMENTS: decreased activity tolerance for ADL's, difficulty walking, decreased balance, decreased endurance, decreased mobility, decreased ROM, decreased strength, impaired flexibility, impairedLE use, and pain.  ACTIVITY LIMITATIONS: bending, liftting, walking, standing, cleaning, community activity, driving, reaching, carry, occupation  PERSONAL FACTORS: see above PMH  also affecting patient's functional outcome.  REHAB POTENTIAL: Good  CLINICAL DECISION MAKING: Stable/uncomplicated  EVALUATION COMPLEXITY: Low    GOALS: Short term PT Goals Target date: 12/27/2023   Pt will be I and compliant with HEP. Baseline:  Goal status: MET 12/09/23  Pt will decrease  pain by 25% overall Baseline: Goal status: MET 12/09/23  Long term PT goals Target date:01/10/2024   Pt will improve lumbar ROM to [facility] to improve functional mobility Baseline: Goal status: ONGOING 12/09/23 see table  Pt will improve leg  strength to at least 4+/5 MMT  to improve functional strength Baseline: Goal status: New Pt will improve Patient specific functional scale (PSFS) to at least 6/10 to show improved function level Baseline:2/10 Goal status: New Pt will reduce pain to overall less than 3/10 with usual activity, bending over, walking Baseline: Goal status: New Pt will be able to ambulate community distances at least 500 ft WNL gait pattern with only mild complaints Baseline: Goal status: New  PLAN: PT FREQUENCY: 2-3 times per week   PT DURATION: 6 weeks  PLANNED INTERVENTIONS  97110-Therapeutic exercises, 97530- Therapeutic activity, [id number]- Neuromuscular re-education, 97535- Self Care, [id number]- Manual therapy, [id number]- Electrical stimulation (unattended), and [id number]- Traction (mechanical)  PLAN FOR NEXT SESSION: monitor for soreness,  focus on extension based program, work on general strength/endurance getting her more active, posture, walking tolerance. How is revised HEP? Careful with L shoulder/exercises due to pain with TB work   NEXT MD VISIT: 01/24/24  Redell Moose, PT, DPT 12/14/23 11:41 AM

## 2023-12-16 ENCOUNTER — Encounter: Payer: Self-pay | Admitting: Physical Therapy

## 2023-12-16 ENCOUNTER — Ambulatory Visit: Admitting: Physical Therapy

## 2023-12-16 DIAGNOSIS — M5459 Other low back pain: Secondary | ICD-10-CM

## 2023-12-16 DIAGNOSIS — M79662 Pain in left lower leg: Secondary | ICD-10-CM

## 2023-12-16 DIAGNOSIS — R262 Difficulty in walking, not elsewhere classified: Secondary | ICD-10-CM

## 2023-12-16 DIAGNOSIS — M6281 Muscle weakness (generalized): Secondary | ICD-10-CM

## 2023-12-16 NOTE — Therapy (Signed)
 OUTPATIENT PHYSICAL THERAPY TREATMENT    Patient Name: [name] MRN: [id number] DOB:09-08-1960, 63 y.o., female Today's Date: 12/16/2023  END OF SESSION:  PT End of Session - 12/16/23 1439     Visit Number 5    Number of Visits 12    Date for Recertification  01/10/24    Authorization Type [employer]    Progress Note Due on Visit 10    PT Start Time 1100    PT Stop Time 1145    PT Time Calculation (min) 45 min    Activity Tolerance Patient tolerated treatment well    Behavior During Therapy WFL for tasks assessed/performed            Past Medical History:  Diagnosis Date   Anxiety    Arthritis    Colon perforation (HCC)    due to appendicitis   GERD (gastroesophageal reflux disease)    Hypertension    S/P endoscopy    2008: noncritical Schatzki ring s/p dilation, normal esophagus and stomach, 2003: normal esophagus, s/p Maloney dilation, multiple antral erosions consistent with chronic gastritis, no H.pylori,    Past Surgical History:  Procedure Laterality Date   APPENDECTOMY     BOWEL RESECTION N/A 12/06/2019   Procedure: PARTIAL SMALL BOWEL RESECTION WITH ILEOSTOMY;  Surgeon: Mavis Anes, MD;  Location: [facility];  Service: General;  Laterality: N/A;   CENTRAL LINE INSERTION N/A 12/06/2019   Procedure: CENTRAL LINE INSERTION;  Surgeon: Mavis Anes, MD;  Location: [facility];  Service: General;  Laterality: N/A;   CESAREAN SECTION     CHOLECYSTECTOMY     COLONOSCOPY  10/17/2010   normal    ESOPHAGOGASTRODUODENOSCOPY  10/17/2010   non-critical Schatzki's ring s/p dilation, patulous EG junction, small hiatal hernia, otherwise normal stomach, first and second portion of duodenum   ESOPHAGOGASTRODUODENOSCOPY N/A 10/26/2018   Procedure: ESOPHAGOGASTRODUODENOSCOPY (EGD);  Surgeon: Shaaron Lamar HERO, MD;  Location: [facility];  Service: Endoscopy;  Laterality: N/A;  9:15am   FOOT SURGERY Right    heel spur   HAND SURGERY Left    carpal tunnel   HERNIA REPAIR N/A     approx.    10 or 12 years ago   IR FLUORO GUIDE CV LINE LEFT  06/14/2020   IR FLUORO GUIDE CV LINE RIGHT  09/06/2020   IR US  GUIDE VASC ACCESS RIGHT  09/06/2020   KNEE ARTHROSCOPY WITH MEDIAL MENISECTOMY  01/07/2012   Procedure: KNEE ARTHROSCOPY WITH MEDIAL MENISECTOMY;  Surgeon: Lemond Stable, MD;  Location: [facility];  Service: Orthopedics;  Laterality: Right;   LAPAROTOMY N/A 12/04/2019   Procedure: EXPLORATORY LAPAROTOMY;  Surgeon: Mavis Anes, MD;  Location: [facility];  Service: General;  Laterality: N/A;   LAPAROTOMY N/A 12/06/2019   Procedure: EXPLORATORY LAPAROTOMY;  Surgeon: Mavis Anes, MD;  Location: [facility];  Service: General;  Laterality: N/A;   MALONEY DILATION  10/17/2010   Procedure: AGAPITO HODGKIN;  Surgeon: Lamar HERO Shaaron, MD;  Location: [facility];  Service: Endoscopy;  Laterality: N/A;   MALONEY DILATION N/A 10/26/2018   Procedure: AGAPITO DILATION;  Surgeon: Shaaron Lamar HERO, MD;  [facility]: [facility];  Service: Endoscopy;  Laterality: N/A;   PARTIAL COLECTOMY Right 12/04/2019   Procedure: RIGHT HEMI COLECTOMY WITH TERMINAL ILEUM RESECTION;  Surgeon: Mavis Anes, MD;  Location: [facility];  Service: General;  Laterality: Right;   TOTAL KNEE ARTHROPLASTY Right 04/18/2019   Procedure: RIGHT TOTAL KNEE ARTHROPLASTY;  Surgeon: Margrette Taft BRAVO, MD;  Location: [facility];  Service: Orthopedics;  Laterality: Right;   TRIGGER FINGER RELEASE Right 08/20/2016   Procedure: RELEASE A-1 PULLEY RIGHT LONG FINGER;  Surgeon: Margrette Taft BRAVO, MD;  Location: [facility];  Service: Orthopedics;  Laterality: Right;   Patient Active Problem List   Diagnosis Date Noted   Hyponatremia 09/02/2020   AKI (acute kidney injury) 05/14/2020   Lactic acidosis 05/14/2020   Dehydration 05/14/2020   PICC line infection, initial encounter    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   Small bowel perforation (HCC)    S/P partial colectomy 12/04/2019   Peritonitis due to abscess [facility])    Colon perforation (HCC)     S/P total knee replacement, 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   Primary osteoarthritis of right knee    Chest pain 03/02/2018   Acquired trigger finger of right middle finger    Knee pain, right 04/06/2015   Dysphagia 10/09/2010   GERD (gastroesophageal reflux disease) 10/09/2010   Encounter for screening colonoscopy 10/09/2010    PCP: Leonce Lucie PARAS, PA-C   REFERRING PROVIDER: Margrette Taft BRAVO, MD   REFERRING DIAG:  M54.50 (ICD-10-CM) - Lumbar pain  M51.362 (ICD-10-CM) - Degeneration of intervertebral disc of lumbar region with discogenic back pain and lower extremity pain    Rationale for Evaluation and Treatment:  Rehabiliation  THERAPY DIAG:  Other low back pain  Pain in left lower leg  Muscle weakness (generalized)  Difficulty in walking, not elsewhere classified  ONSET DATE: One month onset of pain   SUBJECTIVE:                                                                                                                                                                                           SUBJECTIVE STATEMENT: Pain is not as bad today   Per eval: 63 year old female with left leg pain that runs down the back of her leg and she is having numbness in her picky toe and cramping.  The patient had a successful right total knee several years ago but says it is doing good. She does report she has bursitis and arthritis in it bad. She does say she had bad episode in 2021 and she had ruptured appendix and colon and became septic and ended up bed ridden for about 6 months so she has become inactive.    Comes in today complains of pain in her left leg numbness in her left toe    PERTINENT HISTORY:  See above PMH  PAIN:  NPRS scale: 3/10 upon arrival Pain location:left low back, buttock and down leg to pinky toe Pain description: intermittent, achy, sharp, numbness, achy Aggravating factors: sitting too long (  bending is much better) Relieving factors: heat and back HEP  except for shoulder exercises    PRECAUTIONS: ,  None  RED FLAGS: None   WEIGHT BEARING RESTRICTIONS:  No  FALLS:  Has patient fallen in last 6 months? No   OCCUPATION:  Disabled.  PLOF:  Independent  PATIENT GOALS:  Reduce pain, be more active, improve posture.   OBJECTIVE:  Note: Objective measures were completed at Evaluation unless otherwise noted.  DIAGNOSTIC FINDINGS:  Result Date: 11/24/2023 X-ray report Chief complaint leg ache and small toe numb Images L spine 3 views Reading: slight coronal plane mal alignment End plate spurs Mild joint sp narrowing Mild facet arthritis Impression: mild to mod DDD   DG Knee AP/LAT W/Sunrise Left Result Date: 11/24/2023 X-ray report Chief complaint left knee pain Images 3 views Reading: varus alignment Joint sp narrowing medially with mod to severe osteophytes Impression: grade 3 OA   PATIENT SURVEYS:  Patient-Specific Activity Scoring Scheme  0 represents "unable to perform." 10 represents "able to perform at prior level. 0 1 2 3 4 5 6 7 8 9  10 (Date and Score)   Activity Eval     1. Standing 30 minutes 2    2.       3.     4.    5.    Score 2/10    Total score = sum of the activity scores/number of activities Minimum detectable change (90%CI) for average score = 2 points Minimum detectable change (90%CI) for single activity score = 3 points     EDEMA:  No  GAIT: Assistive device utilized: None Level of assistance: Complete Independence Comments: limited distances due to pain and has difficulty with upright posture,     LUMBAR ROM:   Active  AROM  eval 12/09/23  Flexion 90% WNL  Extension 50% feels good 50% limited   Right lateral flexion 75% and pull on left side WNL   Left lateral flexion 75% 25% limited   Right rotation 25% 50% limited   Left rotation 25% 50% limited    (Blank rows = not tested)   LOWER EXTREMITY MMT:    MMT Right eval Left eval  Hip flexion 4+ 4 and pain   Hip  extension    Hip abduction 4+ 4 and cramping  Hip adduction    Hip internal rotation    Hip external rotation    Knee flexion 5 4 and pain  Knee extension 5 4+  Ankle dorsiflexion 5 5  Ankle plantarflexion 5 5 in sitting  Ankle inversion    Ankle eversion     (Blank rows = not tested)    SPECIAL TESTS:  Lumbar Slump test: Positive                                                                                                                            TREATMENT DATE:  12/16/23 Therex Nustep seat #6 L5x8 minutes all four  extremities 8 minutes HS stretching and sciatic flossing X 10 reps bilat UBE L4x5 minutes backwards only for postural mm endurance and retraining, no increased shoulder pain with this  Standing lumbar extensions against wall 10x3 seconds progressed depth as tolerated  Theractivity Seated hamstring curls green 2X10 bilat Seated knee extensions 4# 2X10 Hip hikes x10 B Standing hip abductions red 2X10 bilat Standing hip extensions red 2X10 bilat March walking in bars with UE support 4 round trips. Leg press machine DL 89# [id number]  89/78/74 Therex Nustep seat #6 L5x8 minutes all four extremities 8 minutes L HS stretching and sciatic flossing X 10 reps bilat UBE L4x5 minutes backwards only for postural mm endurance and retraining, no increased shoulder pain with this  Standing lumbar extensions against wall 10x3 seconds progressed depth as tolerated  Theractivity Seated hamstring curls green 2X10 bilat Seated knee extensions green 2X10 bilat Hip hikes x10 B Standing hip abductions red 2X10 bilat Standing hip extensions red 2X10 bilat March walking in bars with UE support 4 round trips. Leg press machine DL 89# [id number]  PATIENT EDUCATION: Education details: HEP, PT plan of care, selfcare Person educated: Patient Education method: Explanation, Demonstration, Verbal cues, and Handouts Education comprehension: verbalized understanding, further education  recommended   HOME EXERCISE PROGRAM:  Access Code: [email or web address] URL: [email or web address] Date: 12/09/2023 Prepared by: Josette Rough  Exercises - Standing Lumbar Extension  - 2 x daily - 6 x weekly - 2 sets - 10 reps - Seated Piriformis Stretch (Mirrored)  - 2 x daily - 6 x weekly - 1 sets - 3 reps - 30 hold - Seated Slump Nerve Glide (Mirrored)  - 2 x daily - 6 x weekly - 1 sets - 10 reps - 3 sec hold - TA Bracing + March   - 1 x daily - 7 x weekly - 2 sets - 10 reps - Prone Hip Extension with Bent Knee  - 1 x daily - 7 x weekly - 2 sets - 10 reps  ASSESSMENT:  CLINICAL IMPRESSION: Overall progressing well with PT and we will continue to work to improve function without aggravating pain too much.     Per Eval:  Patient referred to PT for evaluate and treat lumbar spine with discogenic back pain causing some left sided radiculopathy/sciatica, Referring provider recommending  3x weekly for 6 weeks . Patient will benefit from skilled PT to improve overall function and to address impairments and limitations listed below.  OBJECTIVE IMPAIRMENTS: decreased activity tolerance for ADL's, difficulty walking, decreased balance, decreased endurance, decreased mobility, decreased ROM, decreased strength, impaired flexibility, impairedLE use, and pain.  ACTIVITY LIMITATIONS: bending, liftting, walking, standing, cleaning, community activity, driving, reaching, carry, occupation  PERSONAL FACTORS: see above PMH  also affecting patient's functional outcome.  REHAB POTENTIAL: Good  CLINICAL DECISION MAKING: Stable/uncomplicated  EVALUATION COMPLEXITY: Low    GOALS: Short term PT Goals Target date: 12/27/2023   Pt will be I and compliant with HEP. Baseline:  Goal status: MET 12/09/23  Pt will decrease pain by 25% overall Baseline: Goal status: MET 12/09/23  Long term PT goals Target date:01/10/2024   Pt will improve lumbar ROM to [facility] to improve functional  mobility Baseline: Goal status: ONGOING 12/09/23 see table  Pt will improve leg  strength to at least 4+/5 MMT to improve functional strength Baseline: Goal status: New Pt will improve Patient specific functional scale (PSFS) to at least 6/10 to show improved function level Baseline:2/10 Goal status: New Pt will reduce pain to  overall less than 3/10 with usual activity, bending over, walking Baseline: Goal status: New Pt will be able to ambulate community distances at least 500 ft WNL gait pattern with only mild complaints Baseline: Goal status: New  PLAN: PT FREQUENCY: 2-3 times per week   PT DURATION: 6 weeks  PLANNED INTERVENTIONS  97110-Therapeutic exercises, 97530- Therapeutic activity, [id number]- Neuromuscular re-education, 97535- Self Care, [id number]- Manual therapy, [id number]- Electrical stimulation (unattended), and 97012- Traction (mechanical)  PLAN FOR NEXT SESSION: monitor for soreness,  focus on extension based program, work on general strength/endurance getting her more active, posture, walking tolerance. How is revised HEP? Careful with L shoulder/exercises due to pain with TB work   NEXT MD VISIT: 01/24/24  Redell Moose, PT, DPT 12/16/23 2:40 PM

## 2023-12-20 ENCOUNTER — Encounter: Payer: Self-pay | Admitting: Physical Therapy

## 2023-12-20 ENCOUNTER — Ambulatory Visit: Admitting: Physical Therapy

## 2023-12-20 DIAGNOSIS — M5459 Other low back pain: Secondary | ICD-10-CM | POA: Diagnosis not present

## 2023-12-20 DIAGNOSIS — M6281 Muscle weakness (generalized): Secondary | ICD-10-CM

## 2023-12-20 DIAGNOSIS — R262 Difficulty in walking, not elsewhere classified: Secondary | ICD-10-CM

## 2023-12-20 DIAGNOSIS — M79662 Pain in left lower leg: Secondary | ICD-10-CM

## 2023-12-20 NOTE — Therapy (Signed)
 OUTPATIENT PHYSICAL THERAPY TREATMENT    Patient Name: SHANEDRA LAVE MRN: [id number] DOB:10/26/1960, 63 y.o., female Today's Date: 12/20/2023  END OF SESSION:  PT End of Session - 12/20/23 1026     Visit Number 6    Number of Visits 12    Date for Recertification  01/10/24    Authorization Type [employer]    Progress Note Due on Visit 10    PT Start Time 1015    PT Stop Time 1055    PT Time Calculation (min) 40 min    Activity Tolerance Patient tolerated treatment well    Behavior During Therapy WFL for tasks assessed/performed            Past Medical History:  Diagnosis Date   Anxiety    Arthritis    Colon perforation (HCC)    due to appendicitis   GERD (gastroesophageal reflux disease)    Hypertension    S/P endoscopy    2008: noncritical Schatzki ring s/p dilation, normal esophagus and stomach, 2003: normal esophagus, s/p Maloney dilation, multiple antral erosions consistent with chronic gastritis, no H.pylori,    Past Surgical History:  Procedure Laterality Date   APPENDECTOMY     BOWEL RESECTION N/A 12/06/2019   Procedure: PARTIAL SMALL BOWEL RESECTION WITH ILEOSTOMY;  Surgeon: Mavis Anes, MD;  Location: [facility];  Service: General;  Laterality: N/A;   CENTRAL LINE INSERTION N/A 12/06/2019   Procedure: CENTRAL LINE INSERTION;  Surgeon: Mavis Anes, MD;  Location: [facility];  Service: General;  Laterality: N/A;   CESAREAN SECTION     CHOLECYSTECTOMY     COLONOSCOPY  10/17/2010   normal    ESOPHAGOGASTRODUODENOSCOPY  10/17/2010   non-critical Schatzki's ring s/p dilation, patulous EG junction, small hiatal hernia, otherwise normal stomach, first and second portion of duodenum   ESOPHAGOGASTRODUODENOSCOPY N/A 10/26/2018   Procedure: ESOPHAGOGASTRODUODENOSCOPY (EGD);  Surgeon: Shaaron Lamar HERO, MD;  Location: [facility];  Service: Endoscopy;  Laterality: N/A;  9:15am   FOOT SURGERY Right    heel spur   HAND SURGERY Left    carpal tunnel   HERNIA REPAIR N/A     approx.    10 or 12 years ago   IR FLUORO GUIDE CV LINE LEFT  06/14/2020   IR FLUORO GUIDE CV LINE RIGHT  09/06/2020   IR US  GUIDE VASC ACCESS RIGHT  09/06/2020   KNEE ARTHROSCOPY WITH MEDIAL MENISECTOMY  01/07/2012   Procedure: KNEE ARTHROSCOPY WITH MEDIAL MENISECTOMY;  Surgeon: Lemond Stable, MD;  Location: [facility];  Service: Orthopedics;  Laterality: Right;   LAPAROTOMY N/A 12/04/2019   Procedure: EXPLORATORY LAPAROTOMY;  Surgeon: Mavis Anes, MD;  Location: [facility];  Service: General;  Laterality: N/A;   LAPAROTOMY N/A 12/06/2019   Procedure: EXPLORATORY LAPAROTOMY;  Surgeon: Mavis Anes, MD;  Location: [facility];  Service: General;  Laterality: N/A;   MALONEY DILATION  10/17/2010   Procedure: AGAPITO HODGKIN;  Surgeon: Lamar HERO Shaaron, MD;  Location: [facility];  Service: Endoscopy;  Laterality: N/A;   MALONEY DILATION N/A 10/26/2018   Procedure: AGAPITO DILATION;  Surgeon: Shaaron Lamar HERO, MD;  [facility]: [facility];  Service: Endoscopy;  Laterality: N/A;   PARTIAL COLECTOMY Right 12/04/2019   Procedure: RIGHT HEMI COLECTOMY WITH TERMINAL ILEUM RESECTION;  Surgeon: Mavis Anes, MD;  Location: [facility];  Service: General;  Laterality: Right;   TOTAL KNEE ARTHROPLASTY Right 04/18/2019   Procedure: RIGHT TOTAL KNEE ARTHROPLASTY;  Surgeon: Margrette Taft BRAVO, MD;  Location: [facility];  Service: Orthopedics;  Laterality: Right;   TRIGGER FINGER RELEASE Right 08/20/2016   Procedure: RELEASE A-1 PULLEY RIGHT LONG FINGER;  Surgeon: Margrette Taft BRAVO, MD;  Location: [facility];  Service: Orthopedics;  Laterality: Right;   Patient Active Problem List   Diagnosis Date Noted   Hyponatremia 09/02/2020   AKI (acute kidney injury) 05/14/2020   Lactic acidosis 05/14/2020   Dehydration 05/14/2020   PICC line infection, initial encounter    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   Small bowel perforation (HCC)    S/P partial colectomy 12/04/2019   Peritonitis due to abscess [facility])    Colon perforation (HCC)     S/P total knee replacement, 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   Primary osteoarthritis of right knee    Chest pain 03/02/2018   Acquired trigger finger of right middle finger    Knee pain, right 04/06/2015   Dysphagia 10/09/2010   GERD (gastroesophageal reflux disease) 10/09/2010   Encounter for screening colonoscopy 10/09/2010    PCP: Leonce Lucie PARAS, PA-C   REFERRING PROVIDER: Margrette Taft BRAVO, MD   REFERRING DIAG:  M54.50 (ICD-10-CM) - Lumbar pain  M51.362 (ICD-10-CM) - Degeneration of intervertebral disc of lumbar region with discogenic back pain and lower extremity pain    Rationale for Evaluation and Treatment:  Rehabiliation  THERAPY DIAG:  Other low back pain  Pain in left lower leg  Muscle weakness (generalized)  Difficulty in walking, not elsewhere classified  ONSET DATE: One month onset of pain   SUBJECTIVE:                                                                                                                                                                                           SUBJECTIVE STATEMENT: Pain is a little worse today, the weather and rain may have something to do with it  Per eval: 63 year old female with left leg pain that runs down the back of her leg and she is having numbness in her picky toe and cramping.  The patient had a successful right total knee several years ago but says it is doing good. She does report she has bursitis and arthritis in it bad. She does say she had bad episode in 2021 and she had ruptured appendix and colon and became septic and ended up bed ridden for about 6 months so she has become inactive.    Comes in today complains of pain in her left leg numbness in her left toe    PERTINENT HISTORY:  See above PMH  PAIN:  NPRS scale: 4/10 upon arrival Pain location:left low back, buttock and down leg to pinky toe Pain description:  intermittent, achy, sharp, numbness, achy Aggravating factors: sitting too long  (bending is much better) Relieving factors: heat and back HEP except for shoulder exercises    PRECAUTIONS: ,  None  RED FLAGS: None   WEIGHT BEARING RESTRICTIONS:  No  FALLS:  Has patient fallen in last 6 months? No   OCCUPATION:  Disabled.  PLOF:  Independent  PATIENT GOALS:  Reduce pain, be more active, improve posture.   OBJECTIVE:  Note: Objective measures were completed at Evaluation unless otherwise noted.  DIAGNOSTIC FINDINGS:  Result Date: 11/24/2023 X-ray report Chief complaint leg ache and small toe numb Images L spine 3 views Reading: slight coronal plane mal alignment End plate spurs Mild joint sp narrowing Mild facet arthritis Impression: mild to mod DDD   DG Knee AP/LAT W/Sunrise Left Result Date: 11/24/2023 X-ray report Chief complaint left knee pain Images 3 views Reading: varus alignment Joint sp narrowing medially with mod to severe osteophytes Impression: grade 3 OA   PATIENT SURVEYS:  Patient-Specific Activity Scoring Scheme  0 represents "unable to perform." 10 represents "able to perform at prior level. 0 1 2 3 4 5 6 7 8 9  10 (Date and Score)   Activity Eval     1. Standing 30 minutes 2    2.       3.     4.    5.    Score 2/10    Total score = sum of the activity scores/number of activities Minimum detectable change (90%CI) for average score = 2 points Minimum detectable change (90%CI) for single activity score = 3 points     EDEMA:  No  GAIT: Assistive device utilized: None Level of assistance: Complete Independence Comments: limited distances due to pain and has difficulty with upright posture,     LUMBAR ROM:   Active  AROM  eval 12/09/23  Flexion 90% WNL  Extension 50% feels good 50% limited   Right lateral flexion 75% and pull on left side WNL   Left lateral flexion 75% 25% limited   Right rotation 25% 50% limited   Left rotation 25% 50% limited    (Blank rows = not tested)   LOWER EXTREMITY MMT:     MMT Right eval Left eval  Hip flexion 4+ 4 and pain   Hip extension    Hip abduction 4+ 4 and cramping  Hip adduction    Hip internal rotation    Hip external rotation    Knee flexion 5 4 and pain  Knee extension 5 4+  Ankle dorsiflexion 5 5  Ankle plantarflexion 5 5 in sitting  Ankle inversion    Ankle eversion     (Blank rows = not tested)    SPECIAL TESTS:  Lumbar Slump test: Positive                                                                                                                            TREATMENT DATE:  12/20/23 Therex Nustep seat #6 L5x8 minutes all four extremities 8 minutes HS stretching and sciatic flossing X 10 reps bilat Standing lumbar extensions 10x3 seconds progressed depth as tolerated Standing scapular retractions X 15 Standing shoulder rolls X 10 posterior, X 10 anterior  Theractivity Seated hamstring curls green 2X10 bilat Seated knee extensions 4# 2X10 Standing hip abductions red 2X10 bilat Standing hip extensions red 2X10 bilat March walking in bars with UE support 4 round trips. UBE L4x5 minutes, for pushing/pulling Sit to stands X10 no UE support  PATIENT EDUCATION: Education details: HEP, PT plan of care, selfcare Person educated: Patient Education method: Explanation, Demonstration, Verbal cues, and Handouts Education comprehension: verbalized understanding, further education recommended   HOME EXERCISE PROGRAM:  Access Code: [email or web address] URL: [email or web address] Date: 12/09/2023 Prepared by: Josette Rough  Exercises - Standing Lumbar Extension  - 2 x daily - 6 x weekly - 2 sets - 10 reps - Seated Piriformis Stretch (Mirrored)  - 2 x daily - 6 x weekly - 1 sets - 3 reps - 30 hold - Seated Slump Nerve Glide (Mirrored)  - 2 x daily - 6 x weekly - 1 sets - 10 reps - 3 sec hold - TA Bracing + March   - 1 x daily - 7 x weekly - 2 sets - 10 reps - Prone Hip Extension with Bent Knee  - 1 x daily - 7 x weekly  - 2 sets - 10 reps  ASSESSMENT:  CLINICAL IMPRESSION: I did show her some additional exercises today for her shoulder that do not involve resistance bands as they have aggravated her shoulders some in the past. Her ability to stand up from a chair is improving.    Per Eval:  Patient referred to PT for evaluate and treat lumbar spine with discogenic back pain causing some left sided radiculopathy/sciatica, Referring provider recommending  3x weekly for 6 weeks . Patient will benefit from skilled PT to improve overall function and to address impairments and limitations listed below.  OBJECTIVE IMPAIRMENTS: decreased activity tolerance for ADL's, difficulty walking, decreased balance, decreased endurance, decreased mobility, decreased ROM, decreased strength, impaired flexibility, impairedLE use, and pain.  ACTIVITY LIMITATIONS: bending, liftting, walking, standing, cleaning, community activity, driving, reaching, carry, occupation  PERSONAL FACTORS: see above PMH  also affecting patient's functional outcome.  REHAB POTENTIAL: Good  CLINICAL DECISION MAKING: Stable/uncomplicated  EVALUATION COMPLEXITY: Low    GOALS: Short term PT Goals Target date: 12/27/2023   Pt will be I and compliant with HEP. Baseline:  Goal status: MET 12/09/23  Pt will decrease pain by 25% overall Baseline: Goal status: MET 12/09/23  Long term PT goals Target date:01/10/2024   Pt will improve lumbar ROM to [facility] to improve functional mobility Baseline: Goal status: ONGOING 12/09/23 see table  Pt will improve leg  strength to at least 4+/5 MMT to improve functional strength Baseline: Goal status: New Pt will improve Patient specific functional scale (PSFS) to at least 6/10 to show improved function level Baseline:2/10 Goal status: New Pt will reduce pain to overall less than 3/10 with usual activity, bending over, walking Baseline: Goal status: New Pt will be able to ambulate community distances at  least 500 ft WNL gait pattern with only mild complaints Baseline: Goal status: New  PLAN: PT FREQUENCY: 2-3 times per week   PT DURATION: 6 weeks  PLANNED INTERVENTIONS  97110-Therapeutic exercises, 97530- Therapeutic activity, [id number]- Neuromuscular re-education, 97535- Self Care, [id number]- Manual therapy, [id number]- Electrical stimulation (unattended), and 97012- Traction (  mechanical)  PLAN FOR NEXT SESSION: monitor for soreness,  focus on extension based program, work on general strength/endurance getting her more active, posture, walking tolerance. How is revised HEP? Careful with L shoulder/exercises due to pain with TB work   NEXT MD VISIT: 01/24/24  Redell Moose, PT, DPT 12/20/23 10:27 AM

## 2023-12-22 ENCOUNTER — Ambulatory Visit: Admitting: Physical Therapy

## 2023-12-22 ENCOUNTER — Encounter: Payer: Self-pay | Admitting: Physical Therapy

## 2023-12-22 DIAGNOSIS — R262 Difficulty in walking, not elsewhere classified: Secondary | ICD-10-CM

## 2023-12-22 DIAGNOSIS — M5459 Other low back pain: Secondary | ICD-10-CM | POA: Diagnosis not present

## 2023-12-22 DIAGNOSIS — M79662 Pain in left lower leg: Secondary | ICD-10-CM

## 2023-12-22 DIAGNOSIS — M6281 Muscle weakness (generalized): Secondary | ICD-10-CM

## 2023-12-22 NOTE — Therapy (Signed)
 OUTPATIENT PHYSICAL THERAPY TREATMENT    Patient Name: [name] MRN: [id number] DOB:1960/11/15, 63 y.o., female Today's Date: 12/22/2023  END OF SESSION:  PT End of Session - 12/22/23 1022     Visit Number 7    Number of Visits 12    Date for Recertification  01/10/24    Authorization Type [employer]    Progress Note Due on Visit 10    PT Start Time 1010    PT Stop Time 1050    PT Time Calculation (min) 40 min    Activity Tolerance Patient tolerated treatment well    Behavior During Therapy WFL for tasks assessed/performed            Past Medical History:  Diagnosis Date   Anxiety    Arthritis    Colon perforation (HCC)    due to appendicitis   GERD (gastroesophageal reflux disease)    Hypertension    S/P endoscopy    2008: noncritical Schatzki ring s/p dilation, normal esophagus and stomach, 2003: normal esophagus, s/p Maloney dilation, multiple antral erosions consistent with chronic gastritis, no H.pylori,    Past Surgical History:  Procedure Laterality Date   APPENDECTOMY     BOWEL RESECTION N/A 12/06/2019   Procedure: PARTIAL SMALL BOWEL RESECTION WITH ILEOSTOMY;  Surgeon: Mavis Anes, MD;  Location: [facility];  Service: General;  Laterality: N/A;   CENTRAL LINE INSERTION N/A 12/06/2019   Procedure: CENTRAL LINE INSERTION;  Surgeon: Mavis Anes, MD;  Location: [facility];  Service: General;  Laterality: N/A;   CESAREAN SECTION     CHOLECYSTECTOMY     COLONOSCOPY  10/17/2010   normal    ESOPHAGOGASTRODUODENOSCOPY  10/17/2010   non-critical Schatzki's ring s/p dilation, patulous EG junction, small hiatal hernia, otherwise normal stomach, first and second portion of duodenum   ESOPHAGOGASTRODUODENOSCOPY N/A 10/26/2018   Procedure: ESOPHAGOGASTRODUODENOSCOPY (EGD);  Surgeon: Shaaron Lamar HERO, MD;  Location: [facility];  Service: Endoscopy;  Laterality: N/A;  9:15am   FOOT SURGERY Right    heel spur   HAND SURGERY Left    carpal tunnel   HERNIA REPAIR N/A     approx.    10 or 12 years ago   IR FLUORO GUIDE CV LINE LEFT  06/14/2020   IR FLUORO GUIDE CV LINE RIGHT  09/06/2020   IR US  GUIDE VASC ACCESS RIGHT  09/06/2020   KNEE ARTHROSCOPY WITH MEDIAL MENISECTOMY  01/07/2012   Procedure: KNEE ARTHROSCOPY WITH MEDIAL MENISECTOMY;  Surgeon: Lemond Stable, MD;  Location: [facility];  Service: Orthopedics;  Laterality: Right;   LAPAROTOMY N/A 12/04/2019   Procedure: EXPLORATORY LAPAROTOMY;  Surgeon: Mavis Anes, MD;  Location: [facility];  Service: General;  Laterality: N/A;   LAPAROTOMY N/A 12/06/2019   Procedure: EXPLORATORY LAPAROTOMY;  Surgeon: Mavis Anes, MD;  Location: [facility];  Service: General;  Laterality: N/A;   MALONEY DILATION  10/17/2010   Procedure: AGAPITO HODGKIN;  Surgeon: Lamar HERO Shaaron, MD;  Location: [facility];  Service: Endoscopy;  Laterality: N/A;   MALONEY DILATION N/A 10/26/2018   Procedure: AGAPITO DILATION;  Surgeon: Shaaron Lamar HERO, MD;  Location: [facility];  Service: Endoscopy;  Laterality: N/A;   PARTIAL COLECTOMY Right 12/04/2019   Procedure: RIGHT HEMI COLECTOMY WITH TERMINAL ILEUM RESECTION;  Surgeon: Mavis Anes, MD;  Location: [facility];  Service: General;  Laterality: Right;   TOTAL KNEE ARTHROPLASTY Right 04/18/2019   Procedure: RIGHT TOTAL KNEE ARTHROPLASTY;  Surgeon: Margrette Taft BRAVO, MD;  Location: [facility];  Service: Orthopedics;  Laterality: Right;   TRIGGER FINGER RELEASE Right 08/20/2016   Procedure: RELEASE A-1 PULLEY RIGHT LONG FINGER;  Surgeon: Margrette Taft BRAVO, MD;  Location: [facility];  Service: Orthopedics;  Laterality: Right;   Patient Active Problem List   Diagnosis Date Noted   Hyponatremia 09/02/2020   AKI (acute kidney injury) 05/14/2020   Lactic acidosis 05/14/2020   Dehydration 05/14/2020   PICC line infection, initial encounter    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   Small bowel perforation (HCC)    S/P partial colectomy 12/04/2019   Peritonitis due to abscess [facility])    Colon perforation (HCC)     S/P total knee replacement, 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   Primary osteoarthritis of right knee    Chest pain 03/02/2018   Acquired trigger finger of right middle finger    Knee pain, right 04/06/2015   Dysphagia 10/09/2010   GERD (gastroesophageal reflux disease) 10/09/2010   Encounter for screening colonoscopy 10/09/2010    PCP: Leonce Lucie PARAS, PA-C   REFERRING PROVIDER: Margrette Taft BRAVO, MD   REFERRING DIAG:  M54.50 (ICD-10-CM) - Lumbar pain  M51.362 (ICD-10-CM) - Degeneration of intervertebral disc of lumbar region with discogenic back pain and lower extremity pain    Rationale for Evaluation and Treatment:  Rehabiliation  THERAPY DIAG:  Other low back pain  Pain in left lower leg  Muscle weakness (generalized)  Difficulty in walking, not elsewhere classified  ONSET DATE: One month onset of pain   SUBJECTIVE:                                                                                                                                                                                           SUBJECTIVE STATEMENT: Pain is good today and she was even able to clean some yesterday without pain.  Per eval: 63 year old female with left leg pain that runs down the back of her leg and she is having numbness in her picky toe and cramping.  The patient had a successful right total knee several years ago but says it is doing good. She does report she has bursitis and arthritis in it bad. She does say she had bad episode in 2021 and she had ruptured appendix and colon and became septic and ended up bed ridden for about 6 months so she has become inactive.    Comes in today complains of pain in her left leg numbness in her left toe    PERTINENT HISTORY:  See above PMH  PAIN:  NPRS scale: 4/10 upon arrival Pain location:left low back, buttock and down leg to pinky toe Pain description: intermittent, achy,  sharp, numbness, achy Aggravating factors: sitting too long (bending  is much better) Relieving factors: heat and back HEP except for shoulder exercises    PRECAUTIONS: ,  None  RED FLAGS: None   WEIGHT BEARING RESTRICTIONS:  No  FALLS:  Has patient fallen in last 6 months? No   OCCUPATION:  Disabled.  PLOF:  Independent  PATIENT GOALS:  Reduce pain, be more active, improve posture.   OBJECTIVE:  Note: Objective measures were completed at Evaluation unless otherwise noted.  DIAGNOSTIC FINDINGS:  Result Date: 11/24/2023 X-ray report Chief complaint leg ache and small toe numb Images L spine 3 views Reading: slight coronal plane mal alignment End plate spurs Mild joint sp narrowing Mild facet arthritis Impression: mild to mod DDD   DG Knee AP/LAT W/Sunrise Left Result Date: 11/24/2023 X-ray report Chief complaint left knee pain Images 3 views Reading: varus alignment Joint sp narrowing medially with mod to severe osteophytes Impression: grade 3 OA   PATIENT SURVEYS:  Patient-Specific Activity Scoring Scheme  0 represents "unable to perform." 10 represents "able to perform at prior level. 0 1 2 3 4 5 6 7 8 9  10 (Date and Score)   Activity Eval     1. Standing 30 minutes 2    2.       3.     4.    5.    Score 2/10    Total score = sum of the activity scores/number of activities Minimum detectable change (90%CI) for average score = 2 points Minimum detectable change (90%CI) for single activity score = 3 points     EDEMA:  No  GAIT: Assistive device utilized: None Level of assistance: Complete Independence Comments: limited distances due to pain and has difficulty with upright posture,     LUMBAR ROM:   Active  AROM  eval 12/09/23  Flexion 90% WNL  Extension 50% feels good 50% limited   Right lateral flexion 75% and pull on left side WNL   Left lateral flexion 75% 25% limited   Right rotation 25% 50% limited   Left rotation 25% 50% limited    (Blank rows = not tested)   LOWER EXTREMITY MMT:    MMT  Right eval Left eval  Hip flexion 4+ 4 and pain   Hip extension    Hip abduction 4+ 4 and cramping  Hip adduction    Hip internal rotation    Hip external rotation    Knee flexion 5 4 and pain  Knee extension 5 4+  Ankle dorsiflexion 5 5  Ankle plantarflexion 5 5 in sitting  Ankle inversion    Ankle eversion     (Blank rows = not tested)    SPECIAL TESTS:  Lumbar Slump test: Positive                                                                                                                            TREATMENT DATE:  12/22/23  Therex Nustep seat #6 L5x8 minutes all four extremities HS stretching and sciatic flossing X 10 reps bilat Standing lumbar extensions 10x3 seconds progressed depth as tolerated Standing scapular retractions X 15 Standing shoulder rolls X 10 posterior, X 10 anterior True X ride seated elliptical 5 min L1 UE/LE   Theractivity Seated hamstring curls green 2X10 bilat Seated knee extensions 4# 2X15 Standing hip abductions red 2X10 bilat Standing hip extensions red 2X10 bilat March walking in bars with UE support 4 round trips. UBE L4x5 minutes, for pushing/pulling Sit to stands X10 no UE support  12/20/23 Therex Nustep seat #6 L5x8 minutes all four extremities 8 minutes HS stretching and sciatic flossing X 10 reps bilat Standing lumbar extensions 10x3 seconds progressed depth as tolerated Standing scapular retractions X 15 Standing shoulder rolls X 10 posterior, X 10 anterior  Theractivity Seated hamstring curls green 2X10 bilat Seated knee extensions 4# 2X10 Standing hip abductions red 2X10 bilat Standing hip extensions red 2X10 bilat March walking in bars with UE support 4 round trips. UBE L4x5 minutes, for pushing/pulling Sit to stands X10 no UE support  PATIENT EDUCATION: Education details: HEP, PT plan of care, selfcare Person educated: Patient Education method: Explanation, Demonstration, Verbal cues, and Handouts Education  comprehension: verbalized understanding, further education recommended   HOME EXERCISE PROGRAM:  Access Code: [email or web address] URL: [email or web address] Date: 12/09/2023 Prepared by: Josette Rough  Exercises - Standing Lumbar Extension  - 2 x daily - 6 x weekly - 2 sets - 10 reps - Seated Piriformis Stretch (Mirrored)  - 2 x daily - 6 x weekly - 1 sets - 3 reps - 30 hold - Seated Slump Nerve Glide (Mirrored)  - 2 x daily - 6 x weekly - 1 sets - 10 reps - 3 sec hold - TA Bracing + March   - 1 x daily - 7 x weekly - 2 sets - 10 reps - Prone Hip Extension with Bent Knee  - 1 x daily - 7 x weekly - 2 sets - 10 reps  ASSESSMENT:  CLINICAL IMPRESSION: She was feeling good today so I progressed her exercise program some with good overall tolerance noted. PT recommending to continue with current plan of care.   Per Eval:  Patient referred to PT for evaluate and treat lumbar spine with discogenic back pain causing some left sided radiculopathy/sciatica, Referring provider recommending  3x weekly for 6 weeks . Patient will benefit from skilled PT to improve overall function and to address impairments and limitations listed below.  OBJECTIVE IMPAIRMENTS: decreased activity tolerance for ADL's, difficulty walking, decreased balance, decreased endurance, decreased mobility, decreased ROM, decreased strength, impaired flexibility, impairedLE use, and pain.  ACTIVITY LIMITATIONS: bending, liftting, walking, standing, cleaning, community activity, driving, reaching, carry, occupation  PERSONAL FACTORS: see above PMH  also affecting patient's functional outcome.  REHAB POTENTIAL: Good  CLINICAL DECISION MAKING: Stable/uncomplicated  EVALUATION COMPLEXITY: Low    GOALS: Short term PT Goals Target date: 12/27/2023   Pt will be I and compliant with HEP. Baseline:  Goal status: MET 12/09/23  Pt will decrease pain by 25% overall Baseline: Goal status: MET 12/09/23  Long term PT  goals Target date:01/10/2024   Pt will improve lumbar ROM to [facility] to improve functional mobility Baseline: Goal status: ONGOING 12/09/23 see table  Pt will improve leg  strength to at least 4+/5 MMT to improve functional strength Baseline: Goal status: New Pt will improve Patient specific functional scale (PSFS) to at least 6/10 to show improved  function level Baseline:2/10 Goal status: New Pt will reduce pain to overall less than 3/10 with usual activity, bending over, walking Baseline: Goal status: New Pt will be able to ambulate community distances at least 500 ft WNL gait pattern with only mild complaints Baseline: Goal status: New  PLAN: PT FREQUENCY: 2-3 times per week   PT DURATION: 6 weeks  PLANNED INTERVENTIONS  [id number]-Therapeutic exercises, 97530- Therapeutic activity, [id number]- Neuromuscular re-education, 97535- Self Care, [id number]- Manual therapy, [id number]- Electrical stimulation (unattended), and [id number]- Traction (mechanical)  PLAN FOR NEXT SESSION: monitor for soreness,  focus on extension based program, work on general strength/endurance getting her more active, posture, walking tolerance. NEXT MD VISIT: 01/24/24  Redell Moose, PT, DPT 12/22/23 10:50 AM

## 2023-12-27 ENCOUNTER — Encounter: Payer: Self-pay | Admitting: Physical Therapy

## 2023-12-27 ENCOUNTER — Ambulatory Visit: Attending: Orthopedic Surgery | Admitting: Physical Therapy

## 2023-12-27 DIAGNOSIS — R262 Difficulty in walking, not elsewhere classified: Secondary | ICD-10-CM | POA: Diagnosis present

## 2023-12-27 DIAGNOSIS — M5459 Other low back pain: Secondary | ICD-10-CM | POA: Diagnosis present

## 2023-12-27 DIAGNOSIS — M79662 Pain in left lower leg: Secondary | ICD-10-CM | POA: Diagnosis present

## 2023-12-27 DIAGNOSIS — M6281 Muscle weakness (generalized): Secondary | ICD-10-CM | POA: Insufficient documentation

## 2023-12-27 NOTE — Therapy (Signed)
 OUTPATIENT PHYSICAL THERAPY TREATMENT    Patient Name: [name] MRN: [id number] DOB:1960-09-10, 63 y.o., female Today's Date: 12/27/2023  END OF SESSION:  PT End of Session - 12/27/23 1024     Visit Number 8    Number of Visits 12    Date for Recertification  01/10/24    Authorization Type [employer]    Progress Note Due on Visit 10    PT Start Time 1010    PT Stop Time 1050    PT Time Calculation (min) 40 min    Activity Tolerance Patient tolerated treatment well    Behavior During Therapy WFL for tasks assessed/performed             Past Medical History:  Diagnosis Date   Anxiety    Arthritis    Colon perforation (HCC)    due to appendicitis   GERD (gastroesophageal reflux disease)    Hypertension    S/P endoscopy    2008: noncritical Schatzki ring s/p dilation, normal esophagus and stomach, 2003: normal esophagus, s/p Maloney dilation, multiple antral erosions consistent with chronic gastritis, no H.pylori,    Past Surgical History:  Procedure Laterality Date   APPENDECTOMY     BOWEL RESECTION N/A 12/06/2019   Procedure: PARTIAL SMALL BOWEL RESECTION WITH ILEOSTOMY;  Surgeon: Mavis Anes, MD;  Location: [facility];  Service: General;  Laterality: N/A;   CENTRAL LINE INSERTION N/A 12/06/2019   Procedure: CENTRAL LINE INSERTION;  Surgeon: Mavis Anes, MD;  Location: [facility];  Service: General;  Laterality: N/A;   CESAREAN SECTION     CHOLECYSTECTOMY     COLONOSCOPY  10/17/2010   normal    ESOPHAGOGASTRODUODENOSCOPY  10/17/2010   non-critical Schatzki's ring s/p dilation, patulous EG junction, small hiatal hernia, otherwise normal stomach, first and second portion of duodenum   ESOPHAGOGASTRODUODENOSCOPY N/A 10/26/2018   Procedure: ESOPHAGOGASTRODUODENOSCOPY (EGD);  Surgeon: Shaaron Lamar HERO, MD;  Location: [facility];  Service: Endoscopy;  Laterality: N/A;  9:15am   FOOT SURGERY Right    heel spur   HAND SURGERY Left    carpal tunnel   HERNIA REPAIR N/A     approx.    10 or 12 years ago   IR FLUORO GUIDE CV LINE LEFT  06/14/2020   IR FLUORO GUIDE CV LINE RIGHT  09/06/2020   IR US  GUIDE VASC ACCESS RIGHT  09/06/2020   KNEE ARTHROSCOPY WITH MEDIAL MENISECTOMY  01/07/2012   Procedure: KNEE ARTHROSCOPY WITH MEDIAL MENISECTOMY;  Surgeon: Lemond Stable, MD;  Location: [facility];  Service: Orthopedics;  Laterality: Right;   LAPAROTOMY N/A 12/04/2019   Procedure: EXPLORATORY LAPAROTOMY;  Surgeon: Mavis Anes, MD;  Location: [facility];  Service: General;  Laterality: N/A;   LAPAROTOMY N/A 12/06/2019   Procedure: EXPLORATORY LAPAROTOMY;  Surgeon: Mavis Anes, MD;  Location: [facility];  Service: General;  Laterality: N/A;   MALONEY DILATION  10/17/2010   Procedure: AGAPITO HODGKIN;  Surgeon: Lamar HERO Shaaron, MD;  Location: [facility];  Service: Endoscopy;  Laterality: N/A;   MALONEY DILATION N/A 10/26/2018   Procedure: AGAPITO DILATION;  Surgeon: Shaaron Lamar HERO, MD;  [facility]: [facility];  Service: Endoscopy;  Laterality: N/A;   PARTIAL COLECTOMY Right 12/04/2019   Procedure: RIGHT HEMI COLECTOMY WITH TERMINAL ILEUM RESECTION;  Surgeon: Mavis Anes, MD;  Location: [facility];  Service: General;  Laterality: Right;   TOTAL KNEE ARTHROPLASTY Right 04/18/2019   Procedure: RIGHT TOTAL KNEE ARTHROPLASTY;  Surgeon: Margrette Taft BRAVO, MD;  Location:  [facility];  Service: Orthopedics;  Laterality: Right;   TRIGGER FINGER RELEASE Right 08/20/2016   Procedure: RELEASE A-1 PULLEY RIGHT LONG FINGER;  Surgeon: Margrette Taft BRAVO, MD;  Location: [facility];  Service: Orthopedics;  Laterality: Right;   Patient Active Problem List   Diagnosis Date Noted   Hyponatremia 09/02/2020   AKI (acute kidney injury) 05/14/2020   Lactic acidosis 05/14/2020   Dehydration 05/14/2020   PICC line infection, initial encounter    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   Small bowel perforation (HCC)    S/P partial colectomy 12/04/2019   Peritonitis due to abscess [facility])    Colon perforation (HCC)     S/P total knee replacement, 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   Primary osteoarthritis of right knee    Chest pain 03/02/2018   Acquired trigger finger of right middle finger    Knee pain, right 04/06/2015   Dysphagia 10/09/2010   GERD (gastroesophageal reflux disease) 10/09/2010   Encounter for screening colonoscopy 10/09/2010    PCP: Leonce Lucie PARAS, PA-C   REFERRING PROVIDER: Margrette Taft BRAVO, MD   REFERRING DIAG:  M54.50 (ICD-10-CM) - Lumbar pain  M51.362 (ICD-10-CM) - Degeneration of intervertebral disc of lumbar region with discogenic back pain and lower extremity pain    Rationale for Evaluation and Treatment:  Rehabiliation  THERAPY DIAG:  Other low back pain  Pain in left lower leg  Muscle weakness (generalized)  Difficulty in walking, not elsewhere classified  ONSET DATE: One month onset of pain   SUBJECTIVE:                                                                                                                                                                                           SUBJECTIVE STATEMENT: Pain is good today and her shoulders are not bothering her like they were.   Per eval: 63 year old female with left leg pain that runs down the back of her leg and she is having numbness in her picky toe and cramping.  The patient had a successful right total knee several years ago but says it is doing good. She does report she has bursitis and arthritis in it bad. She does say she had bad episode in 2021 and she had ruptured appendix and colon and became septic and ended up bed ridden for about 6 months so she has become inactive.    Comes in today complains of pain in her left leg numbness in her left toe    PERTINENT HISTORY:  See above PMH  PAIN:  NPRS scale: 2/10 upon arrival Pain location:left low back, buttock and down leg to pinky toe Pain description: intermittent,  achy, sharp, numbness, achy Aggravating factors: sitting too long (bending is  much better) Relieving factors: heat and back HEP except for shoulder exercises    PRECAUTIONS: ,  None  RED FLAGS: None   WEIGHT BEARING RESTRICTIONS:  No  FALLS:  Has patient fallen in last 6 months? No   OCCUPATION:  Disabled.  PLOF:  Independent  PATIENT GOALS:  Reduce pain, be more active, improve posture.   OBJECTIVE:  Note: Objective measures were completed at Evaluation unless otherwise noted.  DIAGNOSTIC FINDINGS:  Result Date: 11/24/2023 X-ray report Chief complaint leg ache and small toe numb Images L spine 3 views Reading: slight coronal plane mal alignment End plate spurs Mild joint sp narrowing Mild facet arthritis Impression: mild to mod DDD   DG Knee AP/LAT W/Sunrise Left Result Date: 11/24/2023 X-ray report Chief complaint left knee pain Images 3 views Reading: varus alignment Joint sp narrowing medially with mod to severe osteophytes Impression: grade 3 OA   PATIENT SURVEYS:  Patient-Specific Activity Scoring Scheme  0 represents "unable to perform." 10 represents "able to perform at prior level. 0 1 2 3 4 5 6 7 8 9  10 (Date and Score)   Activity Eval     1. Standing 30 minutes 2    2.       3.     4.    5.    Score 2/10    Total score = sum of the activity scores/number of activities Minimum detectable change (90%CI) for average score = 2 points Minimum detectable change (90%CI) for single activity score = 3 points     EDEMA:  No  GAIT: Assistive device utilized: None Level of assistance: Complete Independence Comments: limited distances due to pain and has difficulty with upright posture,     LUMBAR ROM:   Active  AROM  eval 12/09/23  Flexion 90% WNL  Extension 50% feels good 50% limited   Right lateral flexion 75% and pull on left side WNL   Left lateral flexion 75% 25% limited   Right rotation 25% 50% limited   Left rotation 25% 50% limited    (Blank rows = not tested)   LOWER EXTREMITY MMT:    MMT  Right eval Left eval  Hip flexion 4+ 4 and pain   Hip extension    Hip abduction 4+ 4 and cramping  Hip adduction    Hip internal rotation    Hip external rotation    Knee flexion 5 4 and pain  Knee extension 5 4+  Ankle dorsiflexion 5 5  Ankle plantarflexion 5 5 in sitting  Ankle inversion    Ankle eversion     (Blank rows = not tested)    SPECIAL TESTS:  Lumbar Slump test: Positive                                                                                                                            TREATMENT DATE:  12/27/23 Therex Nustep seat #6 L5x9 minutes all four extremities HS stretching and sciatic flossing X 10 reps bilat Standing lumbar extensions 10x3 seconds progressed depth as tolerated Standing scapular retractions X 15 Standing shoulder rolls X 10 posterior, X 10 anterior True X ride seated elliptical 5 min L1 UE/LE   Theractivity Leg press DL 74# 7K89 Seated hamstring curl machine DL 79# 7K89 bilat Seated knee extension machine 10# 2X15 Standing hip abductions red 2X10 bilat Standing hip extensions red 2X10 bilat Standing hip flexion red 2X10 bilat March walking in bars with UE support 3 round trips. UBE Lx5 minutes, for pushing/pulling   12/22/23 Therex Nustep seat #6 L5x8 minutes all four extremities HS stretching and sciatic flossing X 10 reps bilat Standing lumbar extensions 10x3 seconds progressed depth as tolerated Standing scapular retractions X 15 Standing shoulder rolls X 10 posterior, X 10 anterior True X ride seated elliptical 5 min L1 UE/LE   Theractivity Seated hamstring curls green 2X10 bilat Seated knee extensions 4# 2X15 Standing hip abductions red 2X10 bilat Standing hip extensions red 2X10 bilat March walking in bars with UE support 4 round trips. UBE L4x5 minutes, for pushing/pulling Sit to stands X10 no UE support   PATIENT EDUCATION: Education details: HEP, PT plan of care, selfcare Person educated:  Patient Education method: Explanation, Demonstration, Verbal cues, and Handouts Education comprehension: verbalized understanding, further education recommended   HOME EXERCISE PROGRAM:  Access Code: [email or web address] URL: [email or web address] Date: 12/09/2023 Prepared by: Josette Rough  Exercises - Standing Lumbar Extension  - 2 x daily - 6 x weekly - 2 sets - 10 reps - Seated Piriformis Stretch (Mirrored)  - 2 x daily - 6 x weekly - 1 sets - 3 reps - 30 hold - Seated Slump Nerve Glide (Mirrored)  - 2 x daily - 6 x weekly - 1 sets - 10 reps - 3 sec hold - TA Bracing + March   - 1 x daily - 7 x weekly - 2 sets - 10 reps - Prone Hip Extension with Bent Knee  - 1 x daily - 7 x weekly - 2 sets - 10 reps  ASSESSMENT:  CLINICAL IMPRESSION: She was again feeling good today so I progressed her strength program adding in some weight machines for leg strength. She had good overall tolerance noted. PT recommending to continue with current plan of care.   Per Eval:  Patient referred to PT for evaluate and treat lumbar spine with discogenic back pain causing some left sided radiculopathy/sciatica, Referring provider recommending  3x weekly for 6 weeks . Patient will benefit from skilled PT to improve overall function and to address impairments and limitations listed below.  OBJECTIVE IMPAIRMENTS: decreased activity tolerance for ADL's, difficulty walking, decreased balance, decreased endurance, decreased mobility, decreased ROM, decreased strength, impaired flexibility, impairedLE use, and pain.  ACTIVITY LIMITATIONS: bending, liftting, walking, standing, cleaning, community activity, driving, reaching, carry, occupation  PERSONAL FACTORS: see above PMH  also affecting patient's functional outcome.  REHAB POTENTIAL: Good  CLINICAL DECISION MAKING: Stable/uncomplicated  EVALUATION COMPLEXITY: Low    GOALS: Short term PT Goals Target date: 12/27/2023   Pt will be I and compliant  with HEP. Baseline:  Goal status: MET 12/09/23  Pt will decrease pain by 25% overall Baseline: Goal status: MET 12/09/23  Long term PT goals Target date:01/10/2024   Pt will improve lumbar ROM to [facility] to improve functional mobility Baseline: Goal status: ONGOING 12/09/23 see table  Pt will improve leg  strength to  at least 4+/5 MMT to improve functional strength Baseline: Goal status: New Pt will improve Patient specific functional scale (PSFS) to at least 6/10 to show improved function level Baseline:2/10 Goal status: New Pt will reduce pain to overall less than 3/10 with usual activity, bending over, walking Baseline: Goal status: New Pt will be able to ambulate community distances at least 500 ft WNL gait pattern with only mild complaints Baseline: Goal status: New  PLAN: PT FREQUENCY: 2-3 times per week   PT DURATION: 6 weeks  PLANNED INTERVENTIONS  97110-Therapeutic exercises, 97530- Therapeutic activity, [id number]- Neuromuscular re-education, 97535- Self Care, [id number]- Manual therapy, [id number]- Electrical stimulation (unattended), and 97012- Traction (mechanical)  PLAN FOR NEXT SESSION: monitor for soreness,  focus on extension based program, work on general strength/endurance getting her more active, posture, walking tolerance. NEXT MD VISIT: 01/24/24  Redell Moose, PT, DPT 12/27/23 10:24 AM

## 2023-12-29 ENCOUNTER — Ambulatory Visit: Admitting: Physical Therapy

## 2024-01-03 ENCOUNTER — Ambulatory Visit: Admitting: Physical Therapy

## 2024-01-05 ENCOUNTER — Encounter: Payer: Self-pay | Admitting: Physical Therapy

## 2024-01-05 ENCOUNTER — Ambulatory Visit: Admitting: Physical Therapy

## 2024-01-05 DIAGNOSIS — M6281 Muscle weakness (generalized): Secondary | ICD-10-CM

## 2024-01-05 DIAGNOSIS — R262 Difficulty in walking, not elsewhere classified: Secondary | ICD-10-CM

## 2024-01-05 DIAGNOSIS — M79662 Pain in left lower leg: Secondary | ICD-10-CM

## 2024-01-05 DIAGNOSIS — M5459 Other low back pain: Secondary | ICD-10-CM

## 2024-01-05 NOTE — Therapy (Signed)
 OUTPATIENT PHYSICAL THERAPY TREATMENT    Patient Name: [name] MRN: [id number] DOB:07-03-1960, 63 y.o., female Today's Date: 01/05/2024  END OF SESSION:  PT End of Session - 01/05/24 1016     Visit Number 9    Number of Visits 12    Date for Recertification  01/10/24    Authorization Type [employer]    Progress Note Due on Visit 10    PT Start Time 1015    PT Stop Time 1055    PT Time Calculation (min) 40 min    Activity Tolerance Patient tolerated treatment well    Behavior During Therapy WFL for tasks assessed/performed             Past Medical History:  Diagnosis Date   Anxiety    Arthritis    Colon perforation (HCC)    due to appendicitis   GERD (gastroesophageal reflux disease)    Hypertension    S/P endoscopy    2008: noncritical Schatzki ring s/p dilation, normal esophagus and stomach, 2003: normal esophagus, s/p Maloney dilation, multiple antral erosions consistent with chronic gastritis, no H.pylori,    Past Surgical History:  Procedure Laterality Date   APPENDECTOMY     BOWEL RESECTION N/A 12/06/2019   Procedure: PARTIAL SMALL BOWEL RESECTION WITH ILEOSTOMY;  Surgeon: Mavis Anes, MD;  Location: [facility];  Service: General;  Laterality: N/A;   CENTRAL LINE INSERTION N/A 12/06/2019   Procedure: CENTRAL LINE INSERTION;  Surgeon: Mavis Anes, MD;  Location: [facility];  Service: General;  Laterality: N/A;   CESAREAN SECTION     CHOLECYSTECTOMY     COLONOSCOPY  10/17/2010   normal    ESOPHAGOGASTRODUODENOSCOPY  10/17/2010   non-critical Schatzki's ring s/p dilation, patulous EG junction, small hiatal hernia, otherwise normal stomach, first and second portion of duodenum   ESOPHAGOGASTRODUODENOSCOPY N/A 10/26/2018   Procedure: ESOPHAGOGASTRODUODENOSCOPY (EGD);  Surgeon: Shaaron Lamar HERO, MD;  Location: [facility];  Service: Endoscopy;  Laterality: N/A;  9:15am   FOOT SURGERY Right    heel spur   HAND SURGERY Left    carpal tunnel   HERNIA REPAIR N/A     approx.    10 or 12 years ago   IR FLUORO GUIDE CV LINE LEFT  06/14/2020   IR FLUORO GUIDE CV LINE RIGHT  09/06/2020   IR US  GUIDE VASC ACCESS RIGHT  09/06/2020   KNEE ARTHROSCOPY WITH MEDIAL MENISECTOMY  01/07/2012   Procedure: KNEE ARTHROSCOPY WITH MEDIAL MENISECTOMY;  Surgeon: Lemond Stable, MD;  Location: [facility];  Service: Orthopedics;  Laterality: Right;   LAPAROTOMY N/A 12/04/2019   Procedure: EXPLORATORY LAPAROTOMY;  Surgeon: Mavis Anes, MD;  Location: [facility];  Service: General;  Laterality: N/A;   LAPAROTOMY N/A 12/06/2019   Procedure: EXPLORATORY LAPAROTOMY;  Surgeon: Mavis Anes, MD;  Location: [facility];  Service: General;  Laterality: N/A;   MALONEY DILATION  10/17/2010   Procedure: AGAPITO HODGKIN;  Surgeon: Lamar HERO Shaaron, MD;  Location: [facility];  Service: Endoscopy;  Laterality: N/A;   MALONEY DILATION N/A 10/26/2018   Procedure: AGAPITO DILATION;  Surgeon: Shaaron Lamar HERO, MD;  Location: [facility];  Service: Endoscopy;  Laterality: N/A;   PARTIAL COLECTOMY Right 12/04/2019   Procedure: RIGHT HEMI COLECTOMY WITH TERMINAL ILEUM RESECTION;  Surgeon: Mavis Anes, MD;  Location: [facility];  Service: General;  Laterality: Right;   TOTAL KNEE ARTHROPLASTY Right 04/18/2019   Procedure: RIGHT TOTAL KNEE ARTHROPLASTY;  Surgeon: Margrette Taft BRAVO, MD;  Location:  [facility];  Service: Orthopedics;  Laterality: Right;   TRIGGER FINGER RELEASE Right 08/20/2016   Procedure: RELEASE A-1 PULLEY RIGHT LONG FINGER;  Surgeon: Margrette Taft BRAVO, MD;  Location: [facility];  Service: Orthopedics;  Laterality: Right;   Patient Active Problem List   Diagnosis Date Noted   Hyponatremia 09/02/2020   AKI (acute kidney injury) 05/14/2020   Lactic acidosis 05/14/2020   Dehydration 05/14/2020   PICC line infection, initial encounter    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   Small bowel perforation (HCC)    S/P partial colectomy 12/04/2019   Peritonitis due to abscess [facility])    Colon perforation (HCC)     S/P total knee replacement, 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   Primary osteoarthritis of right knee    Chest pain 03/02/2018   Acquired trigger finger of right middle finger    Knee pain, right 04/06/2015   Dysphagia 10/09/2010   GERD (gastroesophageal reflux disease) 10/09/2010   Encounter for screening colonoscopy 10/09/2010    PCP: Leonce Lucie PARAS, PA-C   REFERRING PROVIDER: Margrette Taft BRAVO, MD   REFERRING DIAG:  M54.50 (ICD-10-CM) - Lumbar pain  M51.362 (ICD-10-CM) - Degeneration of intervertebral disc of lumbar region with discogenic back pain and lower extremity pain    Rationale for Evaluation and Treatment:  Rehabiliation  THERAPY DIAG:  Other low back pain  Pain in left lower leg  Muscle weakness (generalized)  Difficulty in walking, not elsewhere classified  ONSET DATE: One month onset of pain   SUBJECTIVE:                                                                                                                                                                                           SUBJECTIVE STATEMENT: Pain is good today and she has been doing her home exercises  Per eval: 63 year old female with left leg pain that runs down the back of her leg and she is having numbness in her picky toe and cramping.  The patient had a successful right total knee several years ago but says it is doing good. She does report she has bursitis and arthritis in it bad. She does say she had bad episode in 2021 and she had ruptured appendix and colon and became septic and ended up bed ridden for about 6 months so she has become inactive.    Comes in today complains of pain in her left leg numbness in her left toe    PERTINENT HISTORY:  See above PMH  PAIN:  NPRS scale: 2/10 upon arrival Pain location:left low back, buttock and down leg to pinky toe Pain description: intermittent, achy, sharp, numbness,  achy Aggravating factors: sitting too long (bending is much  better) Relieving factors: heat and back HEP except for shoulder exercises    PRECAUTIONS: ,  None  RED FLAGS: None   WEIGHT BEARING RESTRICTIONS:  No  FALLS:  Has patient fallen in last 6 months? No   OCCUPATION:  Disabled.  PLOF:  Independent  PATIENT GOALS:  Reduce pain, be more active, improve posture.   OBJECTIVE:  Note: Objective measures were completed at Evaluation unless otherwise noted.  DIAGNOSTIC FINDINGS:  Result Date: 11/24/2023 X-ray report Chief complaint leg ache and small toe numb Images L spine 3 views Reading: slight coronal plane mal alignment End plate spurs Mild joint sp narrowing Mild facet arthritis Impression: mild to mod DDD   DG Knee AP/LAT W/Sunrise Left Result Date: 11/24/2023 X-ray report Chief complaint left knee pain Images 3 views Reading: varus alignment Joint sp narrowing medially with mod to severe osteophytes Impression: grade 3 OA   PATIENT SURVEYS:  Patient-Specific Activity Scoring Scheme  0 represents "unable to perform." 10 represents "able to perform at prior level. 0 1 2 3 4 5 6 7 8 9  10 (Date and Score)   Activity Eval     1. Standing 30 minutes 2    2.       3.     4.    5.    Score 2/10    Total score = sum of the activity scores/number of activities Minimum detectable change (90%CI) for average score = 2 points Minimum detectable change (90%CI) for single activity score = 3 points     EDEMA:  No  GAIT: Assistive device utilized: None Level of assistance: Complete Independence Comments: limited distances due to pain and has difficulty with upright posture,     LUMBAR ROM:   Active  AROM  eval 12/09/23  Flexion 90% WNL  Extension 50% feels good 50% limited   Right lateral flexion 75% and pull on left side WNL   Left lateral flexion 75% 25% limited   Right rotation 25% 50% limited   Left rotation 25% 50% limited    (Blank rows = not tested)   LOWER EXTREMITY MMT:    MMT Right eval  Left eval  Hip flexion 4+ 4 and pain   Hip extension    Hip abduction 4+ 4 and cramping  Hip adduction    Hip internal rotation    Hip external rotation    Knee flexion 5 4 and pain  Knee extension 5 4+  Ankle dorsiflexion 5 5  Ankle plantarflexion 5 5 in sitting  Ankle inversion    Ankle eversion     (Blank rows = not tested)    SPECIAL TESTS:  Lumbar Slump test: Positive                                                                                                                            TREATMENT DATE:  01/05/24 Therex Nustep  seat #7 L5x10 minutes all four extremities HS stretching and sciatic flossing X 10 reps bilat Standing lumbar extensions 10x3 seconds progressed depth as tolerated Standing scapular retractions X 15 Standing shoulder rolls X 10 posterior, X 10 anterior True X ride seated elliptical 5 min L1 UE/LE   Theractivity Leg press DL 74# 7K87 Seated hamstring curl machine DL 79# 7K89 bilat Seated knee extension machine 15# 2X12 Standing hip abductions red 2X10 bilat Standing hip extensions red 2X10 bilat Standing hip flexion red 2X10 bilat March walking in bars with UE support 3 round trips. UBE Lx5 minutes, for pushing/pulling  12/27/23 Therex Nustep seat #6 L5x9 minutes all four extremities HS stretching and sciatic flossing X 10 reps bilat Standing lumbar extensions 10x3 seconds progressed depth as tolerated Standing scapular retractions X 15 Standing shoulder rolls X 10 posterior, X 10 anterior True X ride seated elliptical 5 min L1 UE/LE   Theractivity Leg press DL 74# 7K89 Seated hamstring curl machine DL 79# 7K89 bilat Seated knee extension machine 10# 2X15 Standing hip abductions red 2X10 bilat Standing hip extensions red 2X10 bilat Standing hip flexion red 2X10 bilat March walking in bars with UE support 3 round trips. UBE Lx5 minutes, for pushing/pulling   PATIENT EDUCATION: Education details: HEP, PT plan of care,  selfcare Person educated: Patient Education method: Explanation, Demonstration, Verbal cues, and Handouts Education comprehension: verbalized understanding, further education recommended   HOME EXERCISE PROGRAM:  Access Code: [email or web address] URL: [email or web address] Date: 12/09/2023 Prepared by: Josette Rough  Exercises - Standing Lumbar Extension  - 2 x daily - 6 x weekly - 2 sets - 10 reps - Seated Piriformis Stretch (Mirrored)  - 2 x daily - 6 x weekly - 1 sets - 3 reps - 30 hold - Seated Slump Nerve Glide (Mirrored)  - 2 x daily - 6 x weekly - 1 sets - 10 reps - 3 sec hold - TA Bracing + March   - 1 x daily - 7 x weekly - 2 sets - 10 reps - Prone Hip Extension with Bent Knee  - 1 x daily - 7 x weekly - 2 sets - 10 reps  ASSESSMENT:  CLINICAL IMPRESSION: She is making good progress with PT in all areas.    Per Eval:  Patient referred to PT for evaluate and treat lumbar spine with discogenic back pain causing some left sided radiculopathy/sciatica, Referring provider recommending  3x weekly for 6 weeks . Patient will benefit from skilled PT to improve overall function and to address impairments and limitations listed below.  OBJECTIVE IMPAIRMENTS: decreased activity tolerance for ADL's, difficulty walking, decreased balance, decreased endurance, decreased mobility, decreased ROM, decreased strength, impaired flexibility, impairedLE use, and pain.  ACTIVITY LIMITATIONS: bending, liftting, walking, standing, cleaning, community activity, driving, reaching, carry, occupation  PERSONAL FACTORS: see above PMH  also affecting patient's functional outcome.  REHAB POTENTIAL: Good  CLINICAL DECISION MAKING: Stable/uncomplicated  EVALUATION COMPLEXITY: Low    GOALS: Short term PT Goals Target date: 12/27/2023   Pt will be I and compliant with HEP. Baseline:  Goal status: MET 12/09/23  Pt will decrease pain by 25% overall Baseline: Goal status: MET 12/09/23  Long  term PT goals Target date:01/10/2024   Pt will improve lumbar ROM to [facility] to improve functional mobility Baseline: Goal status: ONGOING 12/09/23 see table  Pt will improve leg  strength to at least 4+/5 MMT to improve functional strength Baseline: Goal status: New Pt will improve Patient specific functional scale (PSFS) to  at least 6/10 to show improved function level Baseline:2/10 Goal status: New Pt will reduce pain to overall less than 3/10 with usual activity, bending over, walking Baseline: Goal status: New Pt will be able to ambulate community distances at least 500 ft WNL gait pattern with only mild complaints Baseline: Goal status: New  PLAN: PT FREQUENCY: 2-3 times per week   PT DURATION: 6 weeks  PLANNED INTERVENTIONS  97110-Therapeutic exercises, 97530- Therapeutic activity, [id number]- Neuromuscular re-education, 97535- Self Care, [id number]- Manual therapy, [id number]- Electrical stimulation (unattended), and 97012- Traction (mechanical)  PLAN FOR NEXT SESSNo bands for shoulders!!!  focus on extension based program, work on general strength/endurance getting her more active, posture, walking tolerance. NEXT MD VISIT: 01/24/24  Redell Moose, PT, DPT 01/05/24 11:18 AM

## 2024-01-10 ENCOUNTER — Encounter: Payer: Self-pay | Admitting: Physical Therapy

## 2024-01-10 ENCOUNTER — Ambulatory Visit: Admitting: Physical Therapy

## 2024-01-10 DIAGNOSIS — M79662 Pain in left lower leg: Secondary | ICD-10-CM

## 2024-01-10 DIAGNOSIS — M5459 Other low back pain: Secondary | ICD-10-CM

## 2024-01-10 DIAGNOSIS — R262 Difficulty in walking, not elsewhere classified: Secondary | ICD-10-CM

## 2024-01-10 DIAGNOSIS — M6281 Muscle weakness (generalized): Secondary | ICD-10-CM

## 2024-01-10 NOTE — Therapy (Signed)
 OUTPATIENT PHYSICAL THERAPY TREATMENT  Progress Note reporting period 11/29/23 to 01/10/24  See below for objective and subjective measurements relating to patients progress with PT.    Patient Name: [name] MRN: [id number] DOB:[birth year], 63 y.o., female Today's Date: 01/10/2024  END OF SESSION:  PT End of Session - 01/10/24 1207     Visit Number 10    Number of Visits 12    Date for Recertification  01/10/24    Authorization Type [employer]    Progress Note Due on Visit 10    PT Start Time 1020    PT Stop Time 1100    PT Time Calculation (min) 40 min    Activity Tolerance Patient tolerated treatment well    Behavior During Therapy WFL for tasks assessed/performed              Past Medical History:  Diagnosis Date   Anxiety    Arthritis    Colon perforation (HCC)    due to appendicitis   GERD (gastroesophageal reflux disease)    Hypertension    S/P endoscopy    2008: noncritical Schatzki ring s/p dilation, normal esophagus and stomach, 2003: normal esophagus, s/p Maloney dilation, multiple antral erosions consistent with chronic gastritis, no H.pylori,    Past Surgical History:  Procedure Laterality Date   APPENDECTOMY     BOWEL RESECTION N/A 12/06/2019   Procedure: PARTIAL SMALL BOWEL RESECTION WITH ILEOSTOMY;  Surgeon: Mavis Anes, MD;  Location: [facility];  Service: General;  Laterality: N/A;   CENTRAL LINE INSERTION N/A 12/06/2019   Procedure: CENTRAL LINE INSERTION;  Surgeon: Mavis Anes, MD;  Location: [facility];  Service: General;  Laterality: N/A;   CESAREAN SECTION     CHOLECYSTECTOMY     COLONOSCOPY  10/17/2010   normal    ESOPHAGOGASTRODUODENOSCOPY  10/17/2010   non-critical Schatzki's ring s/p dilation, patulous EG junction, small hiatal hernia, otherwise normal stomach, first and second portion of duodenum   ESOPHAGOGASTRODUODENOSCOPY N/A 10/26/2018   Procedure: ESOPHAGOGASTRODUODENOSCOPY (EGD);  Surgeon: Shaaron Lamar HERO, MD;  Location: [facility];  Service: Endoscopy;  Laterality: N/A;  9:15am   FOOT SURGERY Right    heel spur   HAND SURGERY Left    carpal tunnel   HERNIA REPAIR N/A    approx.    10 or 12 years ago   IR FLUORO GUIDE CV LINE LEFT  06/14/2020   IR FLUORO GUIDE CV LINE RIGHT  09/06/2020   IR US  GUIDE VASC ACCESS RIGHT  09/06/2020   KNEE ARTHROSCOPY WITH MEDIAL MENISECTOMY  01/07/2012   Procedure: KNEE ARTHROSCOPY WITH MEDIAL MENISECTOMY;  Surgeon: Lemond Stable, MD;  Location: [facility];  Service: Orthopedics;  Laterality: Right;   LAPAROTOMY N/A 12/04/2019   Procedure: EXPLORATORY LAPAROTOMY;  Surgeon: Mavis Anes, MD;  Location: [facility];  Service: General;  Laterality: N/A;   LAPAROTOMY N/A 12/06/2019   Procedure: EXPLORATORY LAPAROTOMY;  Surgeon: Mavis Anes, MD;  Location: [facility];  Service: General;  Laterality: N/A;   MALONEY DILATION  10/17/2010   Procedure: AGAPITO HODGKIN;  Surgeon: Lamar HERO Shaaron, MD;  Location: [facility];  Service: Endoscopy;  Laterality: N/A;   MALONEY DILATION N/A 10/26/2018   Procedure: AGAPITO DILATION;  Surgeon: Shaaron Lamar HERO, MD;  [facility]: [facility];  Service: Endoscopy;  Laterality: N/A;   PARTIAL COLECTOMY Right 12/04/2019   Procedure: RIGHT HEMI COLECTOMY WITH TERMINAL ILEUM RESECTION;  Surgeon: Mavis Anes, MD;  Location: [facility];  Service: General;  Laterality:  Right;   TOTAL KNEE ARTHROPLASTY Right 04/18/2019   Procedure: RIGHT TOTAL KNEE ARTHROPLASTY;  Surgeon: Margrette Taft BRAVO, MD;  Location: [facility];  Service: Orthopedics;  Laterality: Right;   TRIGGER FINGER RELEASE Right 08/20/2016   Procedure: RELEASE A-1 PULLEY RIGHT LONG FINGER;  Surgeon: Margrette Taft BRAVO, MD;  Location: [facility];  Service: Orthopedics;  Laterality: Right;   Patient Active Problem List   Diagnosis Date Noted   Hyponatremia 09/02/2020   AKI (acute kidney injury) 05/14/2020   Lactic acidosis 05/14/2020   Dehydration 05/14/2020   PICC line infection, initial encounter    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   Small bowel perforation (HCC)    S/P partial colectomy 12/04/2019   Peritonitis due to abscess [facility])    Colon perforation (HCC)    S/P total knee replacement, 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   Primary osteoarthritis of right knee    Chest pain 03/02/2018   Acquired trigger finger of right middle finger    Knee pain, right 04/06/2015   Dysphagia 10/09/2010   GERD (gastroesophageal reflux disease) 10/09/2010   Encounter for screening colonoscopy 10/09/2010    PCP: Leonce Lucie PARAS, PA-C   REFERRING PROVIDER: Margrette Taft BRAVO, MD   REFERRING DIAG:  M54.50 (ICD-10-CM) - Lumbar pain  M51.362 (ICD-10-CM) - Degeneration of intervertebral disc of lumbar region with discogenic back pain and lower extremity pain    Rationale for Evaluation and Treatment:  Rehabiliation  THERAPY DIAG:  Other low back pain  Pain in left lower leg  Muscle weakness (generalized)  Difficulty in walking, not elsewhere classified  ONSET DATE: One month onset of pain   SUBJECTIVE:                                                                                                                                                                                           SUBJECTIVE STATEMENT: Pain is still doing good. Her shoulders are no longer bothering her  Per eval: 63 year old female with left leg pain that runs down the back of her leg and she is having numbness in her picky toe and cramping.  The patient had a successful right total knee several years ago but says it is doing good. She does report she has bursitis and arthritis in it bad. She does say she had bad episode in 2021 and she had ruptured appendix and colon and became septic and ended up bed ridden for about 6 months so she has become inactive.    Comes in today complains of pain in her left leg numbness in her left toe    PERTINENT HISTORY:  See above PMH  PAIN:  NPRS scale: 2/10 upon arrival Pain location:left low back,  buttock and down leg to pinky toe Pain description: intermittent, achy, sharp, numbness, achy Aggravating factors: sitting too long (bending is much better) Relieving factors: heat and back HEP except for shoulder exercises    PRECAUTIONS: ,  None  RED FLAGS: None   WEIGHT BEARING RESTRICTIONS:  No  FALLS:  Has patient fallen in last 6 months? No   OCCUPATION:  Disabled.  PLOF:  Independent  PATIENT GOALS:  Reduce pain, be more active, improve posture.   OBJECTIVE:  Note: Objective measures were completed at Evaluation unless otherwise noted.  DIAGNOSTIC FINDINGS:  Result Date: 11/24/2023 X-ray report Chief complaint leg ache and small toe numb Images L spine 3 views Reading: slight coronal plane mal alignment End plate spurs Mild joint sp narrowing Mild facet arthritis Impression: mild to mod DDD   DG Knee AP/LAT W/Sunrise Left Result Date: 11/24/2023 X-ray report Chief complaint left knee pain Images 3 views Reading: varus alignment Joint sp narrowing medially with mod to severe osteophytes Impression: grade 3 OA   PATIENT SURVEYS:  Patient-Specific Activity Scoring Scheme  0 represents "unable to perform." 10 represents "able to perform at prior level. 0 1 2 3 4 5 6 7 8 9  10 (Date and Score)   Activity Eval   01/10/24  1. Standing 30 minutes 2  5  2.       3.     4.    5.    Score 2/10 5/10   Total score = sum of the activity scores/number of activities Minimum detectable change (90%CI) for average score = 2 points Minimum detectable change (90%CI) for single activity score = 3 points     EDEMA:  No  GAIT: Assistive device utilized: None Level of assistance: Complete Independence Comments: limited distances due to pain and has difficulty with upright posture,     LUMBAR ROM:   Active  AROM  eval 12/09/23  Flexion 90% WNL  Extension 50% feels good 50% limited   Right lateral flexion 75% and pull on left side WNL   Left lateral  flexion 75% 25% limited   Right rotation 25% 50% limited   Left rotation 25% 50% limited    (Blank rows = not tested)   LOWER EXTREMITY MMT:    MMT Right eval Left eval  Hip flexion 4+ 4 and pain   Hip extension    Hip abduction 4+ 4 and cramping  Hip adduction    Hip internal rotation    Hip external rotation    Knee flexion 5 4 and pain  Knee extension 5 4+  Ankle dorsiflexion 5 5  Ankle plantarflexion 5 5 in sitting  Ankle inversion    Ankle eversion     (Blank rows = not tested)    SPECIAL TESTS:  Lumbar Slump test: Positive  TREATMENT DATE:  01/10/24 Therex Nustep seat #7 L5x10 minutes all four extremities HS stretching and sciatic flossing X 10 reps bilat Standing lumbar extensions 10x3 seconds progressed depth as tolerated Standing scapular retractions X 15 Standing shoulder rolls X 10 posterior, X 10 anterior  Theractivity Leg press DL 74# [id number] Seated hamstring curl machine DL 79# 7K89 bilat Seated knee extension machine 15# 2X12 Standing hip abductions red 2X10 bilat Standing hip extensions red 2X10 bilat Standing hip flexion red 2X10 bilat March walking in bars with UE support 3 round trips. True X ride for pushing/pulling 5 min L1 UE/LE    01/05/24 Therex Nustep seat #7 L5x10 minutes all four extremities HS stretching and sciatic flossing X 10 reps bilat Standing lumbar extensions 10x3 seconds progressed depth as tolerated Standing scapular retractions X 15 Standing shoulder rolls X 10 posterior, X 10 anterior True X ride seated elliptical 5 min L1 UE/LE   Theractivity Leg press DL 74# 7K87 Seated hamstring curl machine DL 79# 7K89 bilat Seated knee extension machine 15# 2X12 Standing hip abductions red 2X10 bilat Standing hip extensions red 2X10 bilat Standing hip flexion red 2X10 bilat March walking in bars with UE  support 3 round trips. UBE Lx5 minutes, for pushing/pulling  12/27/23 Therex Nustep seat #6 L5x9 minutes all four extremities HS stretching and sciatic flossing X 10 reps bilat Standing lumbar extensions 10x3 seconds progressed depth as tolerated Standing scapular retractions X 15 Standing shoulder rolls X 10 posterior, X 10 anterior True X ride seated elliptical 5 min L1 UE/LE   Theractivity Leg press DL 74# 7K89 Seated hamstring curl machine DL 79# 7K89 bilat Seated knee extension machine 10# 2X15 Standing hip abductions red 2X10 bilat Standing hip extensions red 2X10 bilat Standing hip flexion red 2X10 bilat March walking in bars with UE support 3 round trips. UBE Lx5 minutes, for pushing/pulling   PATIENT EDUCATION: Education details: HEP, PT plan of care, selfcare Person educated: Patient Education method: Explanation, Demonstration, Verbal cues, and Handouts Education comprehension: verbalized understanding, further education recommended   HOME EXERCISE PROGRAM:  Access Code: [email or web address] URL: [email or web address] Date: 12/09/2023 Prepared by: Josette Rough  Exercises - Standing Lumbar Extension  - 2 x daily - 6 x weekly - 2 sets - 10 reps - Seated Piriformis Stretch (Mirrored)  - 2 x daily - 6 x weekly - 1 sets - 3 reps - 30 hold - Seated Slump Nerve Glide (Mirrored)  - 2 x daily - 6 x weekly - 1 sets - 10 reps - 3 sec hold - TA Bracing + March   - 1 x daily - 7 x weekly - 2 sets - 10 reps - Prone Hip Extension with Bent Knee  - 1 x daily - 7 x weekly - 2 sets - 10 reps  ASSESSMENT:  CLINICAL IMPRESSION: Progress note reflects she is improving with strength and endurance as well as deceasing her overall pain.     Per Eval:  Patient referred to PT for evaluate and treat lumbar spine with discogenic back pain causing some left sided radiculopathy/sciatica, Referring provider recommending  3x weekly for 6 weeks . Patient will benefit from skilled PT to  improve overall function and to address impairments and limitations listed below.  OBJECTIVE IMPAIRMENTS: decreased activity tolerance for ADL's, difficulty walking, decreased balance, decreased endurance, decreased mobility, decreased ROM, decreased strength, impaired flexibility, impairedLE use, and pain.  ACTIVITY LIMITATIONS: bending, liftting, walking, standing, cleaning, community activity, driving, reaching, carry, occupation  PERSONAL FACTORS: see  above PMH  also affecting patient's functional outcome.  REHAB POTENTIAL: Good  CLINICAL DECISION MAKING: Stable/uncomplicated  EVALUATION COMPLEXITY: Low    GOALS: Short term PT Goals Target date: 12/27/2023   Pt will be I and compliant with HEP. Baseline:  Goal status: MET 12/09/23  Pt will decrease pain by 25% overall Baseline: Goal status: MET 12/09/23  Long term PT goals Target date:01/10/2024   Pt will improve lumbar ROM to [facility] to improve functional mobility Baseline: Goal status: ONGOING 12/09/23 see table  Pt will improve leg  strength to at least 4+/5 MMT to improve functional strength Baseline: Goal status: ongoing 01/10/24 Pt will improve Patient specific functional scale (PSFS) to at least 6/10 to show improved function level Baseline:2/10 Goal status: onging 01/10/24 Pt will reduce pain to overall less than 3/10 with usual activity, bending over, walking Baseline: Goal status: MET week of 01/10/24 Pt will be able to ambulate community distances at least 500 ft WNL gait pattern with only mild complaints Baseline: Goal status: ongoing 03/12/23  PLAN: PT FREQUENCY: 2-3 times per week   PT DURATION: 6 weeks  PLANNED INTERVENTIONS  97110-Therapeutic exercises, 97530- Therapeutic activity, 97112- Neuromuscular re-education, 97535- Self Care, [id number]- Manual therapy, [id number]- Electrical stimulation (unattended), and 97012- Traction (mechanical)  PLAN FOR NEXT SESSNo bands for shoulders!!!  focus on extension  based program, work on general strength/endurance getting her more active, posture, walking tolerance. NEXT MD VISIT: 01/24/24  Redell Moose, PT, DPT 01/10/24 12:09 PM

## 2024-01-12 ENCOUNTER — Encounter: Payer: Self-pay | Admitting: Physical Therapy

## 2024-01-12 ENCOUNTER — Ambulatory Visit: Admitting: Physical Therapy

## 2024-01-12 DIAGNOSIS — M6281 Muscle weakness (generalized): Secondary | ICD-10-CM

## 2024-01-12 DIAGNOSIS — M79662 Pain in left lower leg: Secondary | ICD-10-CM

## 2024-01-12 DIAGNOSIS — M5459 Other low back pain: Secondary | ICD-10-CM | POA: Diagnosis not present

## 2024-01-12 DIAGNOSIS — R262 Difficulty in walking, not elsewhere classified: Secondary | ICD-10-CM

## 2024-01-12 NOTE — Therapy (Signed)
 OUTPATIENT PHYSICAL THERAPY TREATMENT  PHYSICAL THERAPY DISCHARGE SUMMARY  Visits from Start of Care: 11  Current functional level related to goals / functional outcomes: See below   Remaining deficits: See below   Education / Equipment: HEP  Plan:  Patient goals were met and she will be discharged today.       Patient Name: [name] MRN: [id number] DOB:Aug 16, 1960, 63 y.o., female Today's Date: 01/12/2024  END OF SESSION:  PT End of Session - 01/12/24 1107     Visit Number 11    Number of Visits 12    Date for Recertification  01/10/24    Authorization Type [employer]    Progress Note Due on Visit 10    PT Start Time 1100    PT Stop Time 1138    PT Time Calculation (min) 38 min    Activity Tolerance Patient tolerated treatment well    Behavior During Therapy WFL for tasks assessed/performed               Past Medical History:  Diagnosis Date   Anxiety    Arthritis    Colon perforation (HCC)    due to appendicitis   GERD (gastroesophageal reflux disease)    Hypertension    S/P endoscopy    2008: noncritical Schatzki ring s/p dilation, normal esophagus and stomach, 2003: normal esophagus, s/p Maloney dilation, multiple antral erosions consistent with chronic gastritis, no H.pylori,    Past Surgical History:  Procedure Laterality Date   APPENDECTOMY     BOWEL RESECTION N/A 12/06/2019   Procedure: PARTIAL SMALL BOWEL RESECTION WITH ILEOSTOMY;  Surgeon: Mavis Anes, MD;  Location: [facility];  Service: General;  Laterality: N/A;   CENTRAL LINE INSERTION N/A 12/06/2019   Procedure: CENTRAL LINE INSERTION;  Surgeon: Mavis Anes, MD;  Location: [facility];  Service: General;  Laterality: N/A;   CESAREAN SECTION     CHOLECYSTECTOMY     COLONOSCOPY  10/17/2010   normal    ESOPHAGOGASTRODUODENOSCOPY  10/17/2010   non-critical Schatzki's ring s/p dilation, patulous EG junction, small hiatal hernia, otherwise normal stomach, first and second portion of duodenum    ESOPHAGOGASTRODUODENOSCOPY N/A 10/26/2018   Procedure: ESOPHAGOGASTRODUODENOSCOPY (EGD);  Surgeon: Shaaron Lamar HERO, MD;  Location: [facility];  Service: Endoscopy;  Laterality: N/A;  9:15am   FOOT SURGERY Right    heel spur   HAND SURGERY Left    carpal tunnel   HERNIA REPAIR N/A    approx.    10 or 12 years ago   IR FLUORO GUIDE CV LINE LEFT  06/14/2020   IR FLUORO GUIDE CV LINE RIGHT  09/06/2020   IR US  GUIDE VASC ACCESS RIGHT  09/06/2020   KNEE ARTHROSCOPY WITH MEDIAL MENISECTOMY  01/07/2012   Procedure: KNEE ARTHROSCOPY WITH MEDIAL MENISECTOMY;  Surgeon: Lemond Stable, MD;  Location: [facility];  Service: Orthopedics;  Laterality: Right;   LAPAROTOMY N/A 12/04/2019   Procedure: EXPLORATORY LAPAROTOMY;  Surgeon: Mavis Anes, MD;  Location: [facility];  Service: General;  Laterality: N/A;   LAPAROTOMY N/A 12/06/2019   Procedure: EXPLORATORY LAPAROTOMY;  Surgeon: Mavis Anes, MD;  Location: [facility];  Service: General;  Laterality: N/A;   MALONEY DILATION  10/17/2010   Procedure: AGAPITO HODGKIN;  Surgeon: Lamar HERO Shaaron, MD;  [facility]: [facility];  Service: Endoscopy;  Laterality: N/A;   MALONEY DILATION N/A 10/26/2018   Procedure: AGAPITO DILATION;  Surgeon: Shaaron Lamar HERO, MD;  [facility]: [facility];  Service: Endoscopy;  Laterality:  N/A;   PARTIAL COLECTOMY Right 12/04/2019   Procedure: RIGHT HEMI COLECTOMY WITH TERMINAL ILEUM RESECTION;  Surgeon: Mavis Anes, MD;  Location: [facility];  Service: General;  Laterality: Right;   TOTAL KNEE ARTHROPLASTY Right 04/18/2019   Procedure: RIGHT TOTAL KNEE ARTHROPLASTY;  Surgeon: Margrette Taft BRAVO, MD;  Location: [facility];  Service: Orthopedics;  Laterality: Right;   TRIGGER FINGER RELEASE Right 08/20/2016   Procedure: RELEASE A-1 PULLEY RIGHT LONG FINGER;  Surgeon: Margrette Taft BRAVO, MD;  Location: [facility];  Service: Orthopedics;  Laterality: Right;   Patient Active Problem List   Diagnosis Date Noted   Hyponatremia 09/02/2020   AKI (acute  kidney injury) 05/14/2020   Lactic acidosis 05/14/2020   Dehydration 05/14/2020   PICC line infection, initial encounter    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   Small bowel perforation (HCC)    S/P partial colectomy 12/04/2019   Peritonitis due to abscess [facility])    Colon perforation (HCC)    S/P total knee replacement, 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   Primary osteoarthritis of right knee    Chest pain 03/02/2018   Acquired trigger finger of right middle finger    Knee pain, right 04/06/2015   Dysphagia 10/09/2010   GERD (gastroesophageal reflux disease) 10/09/2010   Encounter for screening colonoscopy 10/09/2010    PCP: Leonce Lucie PARAS, PA-C   REFERRING PROVIDER: Margrette Taft BRAVO, MD   REFERRING DIAG:  M54.50 (ICD-10-CM) - Lumbar pain  M51.362 (ICD-10-CM) - Degeneration of intervertebral disc of lumbar region with discogenic back pain and lower extremity pain    Rationale for Evaluation and Treatment:  Rehabiliation  THERAPY DIAG:  Other low back pain  Pain in left lower leg  Muscle weakness (generalized)  Difficulty in walking, not elsewhere classified  ONSET DATE: One month onset of pain   SUBJECTIVE:                                                                                                                                                                                           SUBJECTIVE STATEMENT: Pain is still doing good. She feels ready to discharge today.  Per eval: 63 year old female with left leg pain that runs down the back of her leg and she is having numbness in her picky toe and cramping.  The patient had a successful right total knee several years ago but says it is doing good. She does report she has bursitis and arthritis in it bad. She does say she had bad episode in 2021 and she had ruptured appendix and colon and became septic and ended up bed ridden for about 6 months so she has become  inactive.    Comes in today complains of pain in her left leg  numbness in her left toe    PERTINENT HISTORY:  See above PMH  PAIN:  NPRS scale: 2/10 upon arrival Pain location:left low back, buttock and down leg to pinky toe Pain description: intermittent, achy, sharp, numbness, achy Aggravating factors: sitting too long (bending is much better) Relieving factors: heat and back HEP except for shoulder exercises    PRECAUTIONS: ,  None  RED FLAGS: None   WEIGHT BEARING RESTRICTIONS:  No  FALLS:  Has patient fallen in last 6 months? No   OCCUPATION:  Disabled.  PLOF:  Independent  PATIENT GOALS:  Reduce pain, be more active, improve posture.   OBJECTIVE:  Note: Objective measures were completed at Evaluation unless otherwise noted.  DIAGNOSTIC FINDINGS:  Result Date: 11/24/2023 X-ray report Chief complaint leg ache and small toe numb Images L spine 3 views Reading: slight coronal plane mal alignment End plate spurs Mild joint sp narrowing Mild facet arthritis Impression: mild to mod DDD   DG Knee AP/LAT W/Sunrise Left Result Date: 11/24/2023 X-ray report Chief complaint left knee pain Images 3 views Reading: varus alignment Joint sp narrowing medially with mod to severe osteophytes Impression: grade 3 OA   PATIENT SURVEYS:  Patient-Specific Activity Scoring Scheme  0 represents "unable to perform." 10 represents "able to perform at prior level. 0 1 2 3 4 5 6 7 8 9  10 (Date and Score)   Activity Eval   01/10/24 01/12/24  1. Standing 30 minutes 2  5 8   2.        3.      4.     5.     Score 2/10 5/10 8/10   Total score = sum of the activity scores/number of activities Minimum detectable change (90%CI) for average score = 2 points Minimum detectable change (90%CI) for single activity score = 3 points     EDEMA:  No  GAIT: Assistive device utilized: None Level of assistance: Complete Independence Comments: limited distances due to pain and has difficulty with upright posture,     LUMBAR ROM:    Active  AROM  eval 12/09/23 01/12/24  Flexion 90% WNL WNL  Extension 50% feels good 50% limited  WFL  Right lateral flexion 75% and pull on left side WNL  WNl  Left lateral flexion 75% 25% limited  WNL  Right rotation 25% 50% limited  WFL  Left rotation 25% 50% limited  WFL   (Blank rows = not tested)   LOWER EXTREMITY MMT:    MMT Right eval Left eval Rt/Lt 01/12/24  Hip flexion 4+ 4 and pain  5/5  Hip extension     Hip abduction 4+ 4 and cramping   Hip adduction     Hip internal rotation     Hip external rotation     Knee flexion 5 4 and pain 5/5  Knee extension 5 4+ 5/5  Ankle dorsiflexion 5 5 5/5  Ankle plantarflexion 5 5 in sitting 5/5  Ankle inversion     Ankle eversion      (Blank rows = not tested)    SPECIAL TESTS:  Lumbar Slump test: Positive  TREATMENT DATE:  01/10/24 Therex Nustep seat #7 L5x10 minutes all four extremities Tru X ride seated elliptical X 5 min L1 Theractivity Ambulation 500 feet  Standing hip abductions red 2X10 bilat Standing hip extensions red 2X10 bilat Standing hip flexion red 2X10 bilat March walking in bars with UE support 3 round trips.    PATIENT EDUCATION: Education details: HEP, PT plan of care, selfcare Person educated: Patient Education method: Explanation, Demonstration, Verbal cues, and Handouts Education comprehension: verbalized understanding, further education recommended   HOME EXERCISE PROGRAM:  Access Code: [email or web address] URL: [email or web address] Date: 12/09/2023 Prepared by: Josette Rough  Exercises - Standing Lumbar Extension  - 2 x daily - 6 x weekly - 2 sets - 10 reps - Seated Piriformis Stretch (Mirrored)  - 2 x daily - 6 x weekly - 1 sets - 3 reps - 30 hold - Seated Slump Nerve Glide (Mirrored)  - 2 x daily - 6 x weekly - 1 sets - 10 reps - 3 sec hold - TA Bracing  + March   - 1 x daily - 7 x weekly - 2 sets - 10 reps - Prone Hip Extension with Bent Knee  - 1 x daily - 7 x weekly - 2 sets - 10 reps  ASSESSMENT:  CLINICAL IMPRESSION: She has now met her PT goals and feels ready to discharge. I did encourage her to join gym so she can maintain everything as well as continue with her HEP.  Per Eval:  Patient referred to PT for evaluate and treat lumbar spine with discogenic back pain causing some left sided radiculopathy/sciatica, Referring provider recommending  3x weekly for 6 weeks . Patient will benefit from skilled PT to improve overall function and to address impairments and limitations listed below.  OBJECTIVE IMPAIRMENTS: decreased activity tolerance for ADL's, difficulty walking, decreased balance, decreased endurance, decreased mobility, decreased ROM, decreased strength, impaired flexibility, impairedLE use, and pain.  ACTIVITY LIMITATIONS: bending, liftting, walking, standing, cleaning, community activity, driving, reaching, carry, occupation  PERSONAL FACTORS: see above PMH  also affecting patient's functional outcome.  REHAB POTENTIAL: Good  CLINICAL DECISION MAKING: Stable/uncomplicated  EVALUATION COMPLEXITY: Low    GOALS: Short term PT Goals Target date: 12/27/2023   Pt will be I and compliant with HEP. Baseline:  Goal status: MET 12/09/23  Pt will decrease pain by 25% overall Baseline: Goal status: MET 12/09/23  Long term PT goals Target date:01/10/2024   Pt will improve lumbar ROM to [facility] to improve functional mobility Baseline: Goal status: MET 01/12/24 Pt will improve leg  strength to at least 4+/5 MMT to improve functional strength Baseline: Goal status: MET 01/12/24 Pt will improve Patient specific functional scale (PSFS) to at least 6/10 to show improved function level Baseline:2/10 Goal status: MET 01/12/24 Pt will reduce pain to overall less than 3/10 with usual activity, bending over,  walking Baseline: Goal status: MET week of 01/10/24 Pt will be able to ambulate community distances at least 500 ft WNL gait pattern with only mild complaints Baseline: Goal status: MET 01/12/24  PLAN: PT FREQUENCY: 2-3 times per week   PT DURATION: 6 weeks  PLANNED INTERVENTIONS  97110-Therapeutic exercises, 97530- Therapeutic activity, 97112- Neuromuscular re-education, 97535- Self Care, [id number]- Manual therapy, [id number]- Electrical stimulation (unattended), and 02987- Traction (mechanical)  PLAN FOR NEXT SESSION: DC today  Redell Moose, PT, DPT 01/12/24 11:08 AM

## 2024-01-24 ENCOUNTER — Ambulatory Visit: Admitting: Orthopedic Surgery

## 2024-01-24 ENCOUNTER — Encounter: Payer: Self-pay | Admitting: Orthopedic Surgery

## 2024-01-24 DIAGNOSIS — M51362 Other intervertebral disc degeneration, lumbar region with discogenic back pain and lower extremity pain: Secondary | ICD-10-CM | POA: Diagnosis not present

## 2024-01-24 DIAGNOSIS — M7052 Other bursitis of knee, left knee: Secondary | ICD-10-CM

## 2024-01-24 MED ORDER — METHYLPREDNISOLONE ACETATE 40 MG/ML IJ SUSP
40.0000 mg | Freq: Once | INTRAMUSCULAR | Status: AC
  Administered 2024-01-24: 40 mg via INTRA_ARTICULAR

## 2024-01-24 NOTE — Progress Notes (Signed)
   Chief Complaint  Patient presents with   Knee Pain    Left    Back Pain   Encounter Diagnoses  Name Primary?   Degeneration of intervertebral disc of lumbar region with discogenic back pain and lower extremity pain Yes   Pes anserinus bursitis of left knee     63 year old female has completed her physical therapy with good result most of her back pain has significantly improved she still having some residual bursitis pain over the Pez anserine tendons and bursa of the left knee  Knee Pain   Back Pain    PHYSICAL EXAM:   Left knee exam focused  Skin looks good clean dry intact no erythema  Tenderness over the SGT semimembranosus gracilis  Range of motion normal  Stability normal  Muscle tone normal  Assessment and Plan:   Encounter Diagnoses  Name Primary?   Degeneration of intervertebral disc of lumbar region with discogenic back pain and lower extremity pain Yes   Pes anserinus bursitis of left knee     Repeat injection  Ice  Bengay  Procedure note Left knee injection for bursitis   verbal consent was obtained to inject Left knee PES BURSA  Timeout was completed to confirm the site of injection  The medications used were 40 mg of Depo-Medrol  and 1% lidocaine  3 cc  Anesthesia was provided by ethyl chloride and the skin was prepped with alcohol.  After cleaning the skin with alcohol a 25-gauge needle was used to inject the left knee bursa   There were no complications and a sterile bandage was applied

## 2024-01-24 NOTE — Patient Instructions (Signed)
 Bursitis of the knee instructions on controlling pain  Apply ice 3 times a day for 20 minutes  Apply Bengay 3 times a day

## 2024-01-24 NOTE — Progress Notes (Signed)
    01/24/2024   Chief Complaint  Patient presents with   Knee Pain    Left    Back Pain    No diagnosis found.  What pharmacy do you use ? [facility]_______  DOI/DOS/ Date:    Did you get better, worse or no change (Answer below)   Improved back better with PT and home exercises Knee pain better with injection but pain returned a bit  today
